# Patient Record
Sex: Female | Born: 1935
Health system: Southern US, Community
[De-identification: ages and names within clinical notes are randomized; demographics above are authoritative.]

## PROBLEM LIST (undated history)

## (undated) DIAGNOSIS — K429 Umbilical hernia without obstruction or gangrene: Secondary | ICD-10-CM

## (undated) DIAGNOSIS — I219 Acute myocardial infarction, unspecified: Secondary | ICD-10-CM

## (undated) DIAGNOSIS — R918 Other nonspecific abnormal finding of lung field: Secondary | ICD-10-CM

## (undated) DIAGNOSIS — E785 Hyperlipidemia, unspecified: Secondary | ICD-10-CM

## (undated) DIAGNOSIS — M069 Rheumatoid arthritis, unspecified: Secondary | ICD-10-CM

## (undated) DIAGNOSIS — C801 Malignant (primary) neoplasm, unspecified: Secondary | ICD-10-CM

## (undated) DIAGNOSIS — I491 Atrial premature depolarization: Secondary | ICD-10-CM

## (undated) DIAGNOSIS — M810 Age-related osteoporosis without current pathological fracture: Secondary | ICD-10-CM

## (undated) DIAGNOSIS — J449 Chronic obstructive pulmonary disease, unspecified: Secondary | ICD-10-CM

## (undated) DIAGNOSIS — B029 Zoster without complications: Secondary | ICD-10-CM

## (undated) DIAGNOSIS — Z923 Personal history of irradiation: Secondary | ICD-10-CM

## (undated) DIAGNOSIS — M25551 Pain in right hip: Secondary | ICD-10-CM

## (undated) HISTORY — DX: Zoster without complications: B02.9

## (undated) HISTORY — DX: Hyperlipidemia, unspecified: E78.5

## (undated) HISTORY — DX: Rheumatoid arthritis, unspecified: M06.9

## (undated) HISTORY — DX: Age-related osteoporosis without current pathological fracture: M81.0

## (undated) HISTORY — DX: Atrial premature depolarization: I49.1

## (undated) HISTORY — PX: OTHER SURGICAL HISTORY: SHX169

## (undated) HISTORY — DX: Umbilical hernia without obstruction or gangrene: K42.9

## (undated) HISTORY — DX: Chronic obstructive pulmonary disease, unspecified: J44.9

---

## 1997-07-29 ENCOUNTER — Encounter: Admission: RE | Admit: 1997-07-29 | Discharge: 1997-07-29 | Payer: Self-pay | Admitting: Family Medicine

## 1997-08-27 ENCOUNTER — Ambulatory Visit (HOSPITAL_BASED_OUTPATIENT_CLINIC_OR_DEPARTMENT_OTHER): Admission: RE | Admit: 1997-08-27 | Discharge: 1997-08-27 | Payer: Self-pay | Admitting: *Deleted

## 1997-09-05 ENCOUNTER — Ambulatory Visit (HOSPITAL_COMMUNITY): Admission: RE | Admit: 1997-09-05 | Discharge: 1997-09-05 | Payer: Self-pay | Admitting: *Deleted

## 1997-09-23 ENCOUNTER — Ambulatory Visit (HOSPITAL_COMMUNITY): Admission: RE | Admit: 1997-09-23 | Discharge: 1997-09-23 | Payer: Self-pay | Admitting: *Deleted

## 1997-09-25 ENCOUNTER — Encounter: Admission: RE | Admit: 1997-09-25 | Discharge: 1997-09-25 | Payer: Self-pay | Admitting: Family Medicine

## 1997-10-22 ENCOUNTER — Encounter: Admission: RE | Admit: 1997-10-22 | Discharge: 1997-10-22 | Payer: Self-pay | Admitting: Family Medicine

## 1997-10-30 ENCOUNTER — Inpatient Hospital Stay (HOSPITAL_COMMUNITY): Admission: RE | Admit: 1997-10-30 | Discharge: 1997-11-03 | Payer: Self-pay | Admitting: Thoracic Surgery

## 1997-11-12 ENCOUNTER — Encounter: Admission: RE | Admit: 1997-11-12 | Discharge: 1997-11-12 | Payer: Self-pay | Admitting: Sports Medicine

## 1997-12-10 ENCOUNTER — Encounter: Admission: RE | Admit: 1997-12-10 | Discharge: 1997-12-10 | Payer: Self-pay | Admitting: Sports Medicine

## 1998-01-10 ENCOUNTER — Encounter: Admission: RE | Admit: 1998-01-10 | Discharge: 1998-01-10 | Payer: Self-pay | Admitting: Family Medicine

## 1998-01-28 ENCOUNTER — Encounter: Admission: RE | Admit: 1998-01-28 | Discharge: 1998-01-28 | Payer: Self-pay | Admitting: Family Medicine

## 1998-01-28 ENCOUNTER — Ambulatory Visit (HOSPITAL_COMMUNITY): Admission: RE | Admit: 1998-01-28 | Discharge: 1998-01-28 | Payer: Self-pay | Admitting: *Deleted

## 1998-02-14 ENCOUNTER — Encounter: Admission: RE | Admit: 1998-02-14 | Discharge: 1998-02-14 | Payer: Self-pay | Admitting: Family Medicine

## 1998-09-16 ENCOUNTER — Ambulatory Visit (HOSPITAL_COMMUNITY): Admission: RE | Admit: 1998-09-16 | Discharge: 1998-09-16 | Payer: Self-pay | Admitting: Gastroenterology

## 1999-06-09 ENCOUNTER — Encounter: Admission: RE | Admit: 1999-06-09 | Discharge: 1999-09-07 | Payer: Self-pay | Admitting: Internal Medicine

## 1999-12-10 ENCOUNTER — Other Ambulatory Visit: Admission: RE | Admit: 1999-12-10 | Discharge: 1999-12-10 | Payer: Self-pay | Admitting: Family Medicine

## 2000-03-13 ENCOUNTER — Emergency Department (HOSPITAL_COMMUNITY): Admission: EM | Admit: 2000-03-13 | Discharge: 2000-03-14 | Payer: Self-pay | Admitting: Emergency Medicine

## 2000-03-14 ENCOUNTER — Encounter: Payer: Self-pay | Admitting: Emergency Medicine

## 2000-12-04 ENCOUNTER — Encounter: Payer: Self-pay | Admitting: Emergency Medicine

## 2000-12-04 ENCOUNTER — Emergency Department (HOSPITAL_COMMUNITY): Admission: EM | Admit: 2000-12-04 | Discharge: 2000-12-04 | Payer: Self-pay | Admitting: Emergency Medicine

## 2000-12-13 ENCOUNTER — Other Ambulatory Visit: Admission: RE | Admit: 2000-12-13 | Discharge: 2000-12-13 | Payer: Self-pay | Admitting: Family Medicine

## 2001-12-21 ENCOUNTER — Other Ambulatory Visit: Admission: RE | Admit: 2001-12-21 | Discharge: 2001-12-21 | Payer: Self-pay | Admitting: Family Medicine

## 2002-12-27 ENCOUNTER — Other Ambulatory Visit: Admission: RE | Admit: 2002-12-27 | Discharge: 2002-12-27 | Payer: Self-pay | Admitting: Family Medicine

## 2003-12-24 ENCOUNTER — Other Ambulatory Visit: Admission: RE | Admit: 2003-12-24 | Discharge: 2003-12-24 | Payer: Self-pay | Admitting: Obstetrics and Gynecology

## 2004-01-09 ENCOUNTER — Encounter: Admission: RE | Admit: 2004-01-09 | Discharge: 2004-01-09 | Payer: Self-pay | Admitting: Obstetrics and Gynecology

## 2004-02-19 ENCOUNTER — Ambulatory Visit: Admission: RE | Admit: 2004-02-19 | Discharge: 2004-02-19 | Payer: Self-pay | Admitting: Gynecology

## 2004-03-10 ENCOUNTER — Ambulatory Visit (HOSPITAL_COMMUNITY): Admission: RE | Admit: 2004-03-10 | Discharge: 2004-03-10 | Payer: Self-pay | Admitting: Obstetrics and Gynecology

## 2004-03-10 ENCOUNTER — Encounter (INDEPENDENT_AMBULATORY_CARE_PROVIDER_SITE_OTHER): Payer: Self-pay | Admitting: Specialist

## 2004-06-04 ENCOUNTER — Other Ambulatory Visit: Admission: RE | Admit: 2004-06-04 | Discharge: 2004-06-04 | Payer: Self-pay | Admitting: Obstetrics and Gynecology

## 2005-07-05 ENCOUNTER — Other Ambulatory Visit: Admission: RE | Admit: 2005-07-05 | Discharge: 2005-07-05 | Payer: Self-pay | Admitting: Obstetrics and Gynecology

## 2006-08-04 ENCOUNTER — Other Ambulatory Visit: Admission: RE | Admit: 2006-08-04 | Discharge: 2006-08-04 | Payer: Self-pay | Admitting: Obstetrics and Gynecology

## 2007-08-07 ENCOUNTER — Other Ambulatory Visit: Admission: RE | Admit: 2007-08-07 | Discharge: 2007-08-07 | Payer: Self-pay | Admitting: Obstetrics and Gynecology

## 2008-11-13 ENCOUNTER — Ambulatory Visit (HOSPITAL_COMMUNITY): Admission: RE | Admit: 2008-11-13 | Discharge: 2008-11-13 | Payer: Self-pay | Admitting: Family Medicine

## 2008-11-27 ENCOUNTER — Encounter: Admission: RE | Admit: 2008-11-27 | Discharge: 2008-11-27 | Payer: Self-pay | Admitting: Family Medicine

## 2009-07-30 ENCOUNTER — Inpatient Hospital Stay (HOSPITAL_COMMUNITY): Admission: EM | Admit: 2009-07-30 | Discharge: 2009-08-01 | Payer: Self-pay | Admitting: Emergency Medicine

## 2009-07-31 ENCOUNTER — Ambulatory Visit: Payer: Self-pay | Admitting: Surgery

## 2009-07-31 ENCOUNTER — Encounter (INDEPENDENT_AMBULATORY_CARE_PROVIDER_SITE_OTHER): Payer: Self-pay | Admitting: Internal Medicine

## 2009-12-02 ENCOUNTER — Encounter: Admission: RE | Admit: 2009-12-02 | Discharge: 2009-12-02 | Payer: Self-pay | Admitting: Family Medicine

## 2010-07-08 LAB — BASIC METABOLIC PANEL
BUN: 16 mg/dL (ref 6–23)
BUN: 18 mg/dL (ref 6–23)
CO2: 23 mEq/L (ref 19–32)
CO2: 25 mEq/L (ref 19–32)
Calcium: 8.1 mg/dL — ABNORMAL LOW (ref 8.4–10.5)
Calcium: 9.1 mg/dL (ref 8.4–10.5)
Chloride: 103 mEq/L (ref 96–112)
Chloride: 108 mEq/L (ref 96–112)
Creatinine, Ser: 0.77 mg/dL (ref 0.4–1.2)
Creatinine, Ser: 0.83 mg/dL (ref 0.4–1.2)
GFR calc Af Amer: >60 mL/min (ref >60)
GFR calc Af Amer: >60 mL/min (ref >60)
GFR calc non Af Amer: >60 mL/min (ref >60)
GFR calc non Af Amer: >60 mL/min (ref >60)
Glucose, Bld: 77 mg/dL (ref 70–99)
Glucose, Bld: 90 mg/dL (ref 70–99)
Potassium: 3.8 mEq/L (ref 3.5–5.1)
Potassium: 4.1 mEq/L (ref 3.5–5.1)
Sodium: 133 mEq/L — ABNORMAL LOW (ref 135–145)
Sodium: 140 mEq/L (ref 135–145)

## 2010-07-08 LAB — CULTURE, BLOOD (ROUTINE X 2)
Culture: NO GROWTH
Culture: NO GROWTH

## 2010-07-08 LAB — POCT I-STAT, CHEM 8
BUN: 20 mg/dL (ref 6–23)
Calcium, Ion: 1.11 mmol/L — ABNORMAL LOW (ref 1.12–1.32)
Chloride: 108 mEq/L (ref 96–112)
Creatinine, Ser: 0.8 mg/dL (ref 0.4–1.2)
Glucose, Bld: 83 mg/dL (ref 70–99)
HCT: 37 % (ref 36.0–46.0)
Hemoglobin: 12.6 g/dL (ref 12.0–15.0)
Potassium: 4.1 mEq/L (ref 3.5–5.1)
Sodium: 139 mEq/L (ref 135–145)
TCO2: 23 mmol/L (ref 0–100)

## 2010-07-08 LAB — DIFFERENTIAL
Basophils Absolute: 0 10*3/uL (ref 0.0–0.1)
Basophils Relative: 0 % (ref 0–1)
Eosinophils Absolute: 0.1 10*3/uL (ref 0.0–0.7)
Eosinophils Relative: 2 % (ref 0–5)
Lymphocytes Relative: 20 % (ref 12–46)
Lymphs Abs: 1.7 10*3/uL (ref 0.7–4.0)
Monocytes Absolute: 0.5 10*3/uL (ref 0.1–1.0)
Monocytes Relative: 6 % (ref 3–12)
Neutro Abs: 5.8 10*3/uL (ref 1.7–7.7)
Neutrophils Relative %: 71 % (ref 43–77)

## 2010-07-08 LAB — CBC
HCT: 31.7 % — ABNORMAL LOW (ref 36.0–46.0)
HCT: 34.4 % — ABNORMAL LOW (ref 36.0–46.0)
Hemoglobin: 10.9 g/dL — ABNORMAL LOW (ref 12.0–15.0)
Hemoglobin: 11.9 g/dL — ABNORMAL LOW (ref 12.0–15.0)
MCHC: 34.2 g/dL (ref 30.0–36.0)
MCHC: 34.6 g/dL (ref 30.0–36.0)
MCV: 102.5 fL — ABNORMAL HIGH (ref 78.0–100.0)
MCV: 102.9 fL — ABNORMAL HIGH (ref 78.0–100.0)
Platelets: 262 10*3/uL (ref 150–400)
Platelets: 301 10*3/uL (ref 150–400)
RBC: 3.08 MIL/uL — ABNORMAL LOW (ref 3.87–5.11)
RBC: 3.36 MIL/uL — ABNORMAL LOW (ref 3.87–5.11)
RDW: 15 % (ref 11.5–15.5)
RDW: 15.3 % (ref 11.5–15.5)
WBC: 7.4 10*3/uL (ref 4.0–10.5)
WBC: 8.1 10*3/uL (ref 4.0–10.5)

## 2010-07-08 LAB — URIC ACID: Uric Acid, Serum: 3.3 mg/dL (ref 2.4–7.0)

## 2010-09-04 NOTE — H&P (Signed)
[name], CARGO              ACCOUNT NO.:  [id number]   MEDICAL RECORD NO.:  [id number]          PATIENT TYPE:  AMB   LOCATION:  SDC                           FACILITY:  [facility]   PHYSICIAN:  Charles A. Delcambre, MDDATE OF BIRTH:  [birth year]   DATE OF ADMISSION:  DATE OF DISCHARGE:                                HISTORY & PHYSICAL   A 77 year old para 5, postmenopausal with bleeding in August with fibroid  uterus, bleeding down into the toilet water.  Evaluation with urologist  normal.  Findings with cystoscopy per patient history on transvaginal  ultrasound, fluid in the endometrial cavity, 10 x 5 x 13 mm.  Endometrial  biopsy was attempted but the patient did not tolerate.  Biopsy was  abandoned.  She is now to be admitted for dilatation and curettage for  further evaluation.   PAST MEDICAL HISTORY:  Arthritis.   PAST SURGICAL HISTORY:  Hand surgery, abdominal hernia repair x3, tubal  ligation.   MEDICATIONS:  1.  Methotrexate 2.5 mg once a week.  2.  Prednisone 1 mg three times a day.  3.  Aspirin 81 mg three times a week.  4.  Folic acid.  5.  Multivitamins once a day.   ALLERGIES:  Darvocet, erythromycin;  reaction not specified.   SOCIAL HISTORY:  She does smoke, amount not specified.  No alcohol or drug  use.  She is widowed and not sexually active.   FAMILY HISTORY:  Mother with breast cancer died at age 37.  Daughter lung  and breast cancer.  Brother with prostate cancer.  No uterus cancer or  ovarian cancer.   REVIEW OF SYSTEMS:  Denies fever or chills.  No rashes or lesions.  Headaches and dizziness.  Some seasonal allergies.  No chest pain, shortness  of breath or wheezing.  No diarrhea or constipation, bleeding, melena or  hematochezia.  No urgency, frequency, dysuria, incontinence, currently no  gross hematuria.  No galacturia or emotional changes.   PHYSICAL EXAMINATION:  GENERAL:  Alert and oriented x3 in no distress.  VITAL SIGNS:  Pulse 60,  respirations 20, weight 99 pounds, blood pressure  124/70.  HEENT:  Exam is grossly within normal limits.  NECK:  Supple without thyromegaly or adenopathy.  LUNGS:  Clear bilaterally.  BACK:  Vertebral column nontender to palpation.  BREASTS:  Exam deferred.  Normal exam per patient by primary care physician  per patient history.  HEART:  ? a 1/6 systolic ejection murmur left sternal border.  Otherwise no  rub or gallop and regular rate and rhythm.  ABDOMEN:  Soft, flat, nontender.  No hepatosplenomegaly or masses noted.  PELVIC:  Normal external female genitalia.  Bartholin's, urethral and Skene  glands are normal.  Vault without discharge or lesions.  Uterus somewhat  difficult to palpate with differential from a right adnexal mass versus a  pedunculated fibroid.  This has been worked up with multiple imaging  techniques and consultation with Dr. Argentina Ponder and it is felt that this  is most likely a pedunculated fibroid basis MRI and other imaging  techniques, has been  present, basis other studies back to 2002 up to four  years in 2001 or 2002 up to four years ago.  Otherwise, the vault and cervix  were normal in appearance.  External hemorrhoids were present and peroneal  body appeared normal otherwise.   ASSESSMENT:  Postmenopausal bleeding.   PLAN:  Dilatation and curettage.  Electrocardiogram was on the chart from  previous preoperative about a month ago.  CBC and Chem-20 will be obtained  preoperatively.  N.p.o. past midnight, even prior to surgery.  All questions  were answered.  She accepts risks of infection, bleeding, uterine  perforation, bowel and bladder damage and blood product risk.  We will  proceed as outlined.      CAD/MEDQ  D:  03/04/2004  T:  03/04/2004  Job:  [id number]

## 2010-09-04 NOTE — Consult Note (Signed)
[name], [name]              ACCOUNT NO.:  [id number]   MEDICAL RECORD NO.:  [id number]          PATIENT TYPE:  OUT   LOCATION:  GYN                          FACILITY:  [facility]   PHYSICIAN:  De Blanch, M.D.DATE OF BIRTH:  08-09-1929   DATE OF CONSULTATION:  DATE OF DISCHARGE:                                   CONSULTATION   A 77 year old white female is seen in consultation at the request of Dr.  Loreli Dollar regarding a pelvic mass.   The patient has no significant past gynecologic history.  She has been found  to have what was thought to be uterine fibroids on a CT scan in November,  2001.  These are apparently stable on repeat scan in August, 2002.  Most  recently in September, 2005, the patient had an MRI which shows an 8 cm  mass.  The MRI suggests that this mass may be adjacent to the uterus, more  consistent with an ovarian fibroma rather than a pedunculated uterine  fibroid.  An ultrasound does not help [place] understand the anatomy any further.  Patient has had a CA-125 that was normal.   She has also had a slight amount of vaginal bleeding a couple of weeks ago.  She denies any pelvic pain or pressure, GI or GU symptoms.   PAST MEDICAL HISTORY:  Medical illnesses:  Severe rheumatoid arthritis.   PAST SURGICAL HISTORY:  1.  Umbilical hernia repair.  2.  Bilateral repair of her fingers secondary to rheumatoid arthritis.  3.  Thoracotomy.  4.  Bilateral tubal ligation.   CURRENT MEDICATIONS:  Methotrexate, prednisone, aspirin, folic acid.   FAMILY HISTORY:  Two women in the family, the patient's mother and daughter,  who have died of breast cancer.  There is no ovarian cancer in the family  history.   SOCIAL HISTORY:  Patient is single.   REVIEW OF SYSTEMS:  Negative except as noted above.   PHYSICAL EXAMINATION:  VITAL SIGNS:  Weight 100 pounds.  Height 5 feet.  Blood pressure 130/74.  Pulse 90.  GENERAL:  Patient is a slender white female in no  acute distress.  She has  stigmata of rheumatoid arthritis, especially in the hands.  HEENT:  Negative.  NECK:  Supple without thyromegaly.  There is no supraclavicular or inguinal  adenopathy.  ABDOMEN:  Soft and nontender.  No mass, organomegaly, ascites, or hernias  are noted.  PELVIC:  EG/BUS, vagina, bladder, and urethra are normal but atrophic.  The  cervix is atrophic and small.  Bimanual exam reveals a right adnexal mass,  which is firm and feels consistent with a fibroma.  It is my opinion that  she has a small uterus to the left of this mass, and that this mass may well  be an ovarian fibroma.  Rectovaginal exam confirms.  The mass itself is  approximately 6 cm in diameter.   IMPRESSION:  Solid adnexal or pelvic mass, consistent with either with an  ovarian fibroma or pedunculated uterine fibroids.  There is clear  documentation that has been present for at least four years.  Do no believe  that the characteristics on MRI are consistent with malignancy.  Given the  fact that it is asymptomatic, I think it would be perfectly reasonable to  follow this without any further surgical evaluation.  It is easily felt on  pelvic exam.  I do not necessarily think that follow-up scans are necessary  as long as the examining gynecologist feels confident in measuring it on an  annual basis.   Finally, with regard to her bleeding, I agree with Dr. Sydnee Cabal that a D&C  should be performed for complete evaluation.   She will return to the care of Dr. Sydnee Cabal and arrange for a D&C in the  near future.     Dani   DC/MEDQ  D:  02/19/2004  T:  02/19/2004  Job:  [id number]   cc:   Leonette Most A. Sydnee Cabal, MD  Fax: [phone]   Jethro Bastos, M.D.  [address]  [place]  [place] [address]  Fax: [phone]

## 2010-09-04 NOTE — Op Note (Signed)
NAMESHEMAIAH, [name]              ACCOUNT NO.:  [id number]   MEDICAL RECORD NO.:  [id number]          PATIENT TYPE:  AMB   LOCATION:  SDC                           [facility]ACILITY:  [facility]   PHYSICIAN:  Charles A. Delcambre, MDDATE OF BIRTH:  1929/08/06   DATE OF PROCEDURE:  03/10/2004  DATE OF DISCHARGE:                                 OPERATIVE REPORT   PREOPERATIVE DIAGNOSIS:  Postmenopausal bleeding.   POSTOPERATIVE DIAGNOSIS:  Postmenopausal bleeding.   PROCEDURES:  1.  Paracervical block.  2.  Dilation and curettage.   SURGEON:  Charles A. Delcambre, MD   ASSISTANT:  None.   COMPLICATIONS:  None.   ESTIMATED BLOOD LOSS:  Less than 10 mL.   OPERATIVE FINDINGS:  The uterus sounds to 7 cm.  A small amount of tissue  with D&C.   SPECIMENS:  Endometrial curettings.   ANESTHESIA:  General IV sedation/MAC.   DESCRIPTION OF PROCEDURE:  The patient was taken to the operating room and  placed in the supine position.  Sedation was accomplished IV.  The patient  was then placed in the dorsal lithotomy position and a sterile prep and  drape was undertaken.  A weighted speculum was placed in the vagina.  Anterior lip of the cervix was grasped with a single-tooth tenaculum.  A  paracervical block was placed with 0.25% plain Marcaine 20 mL divided  between 4 and 8 o'clock.  No evidence of intravascular location injection  was noted.  Hegar dilators were then used to dilate up enough to pass a  small banjo curette and gentle curettage was undertaken.  There was no  evidence of perforation.  A small amount of tissue was curetted.  Then polyp  forceps yielded no tissue.  The procedure was terminated.  All instruments  were removed.  Hemostasis was adequate at the tenaculum.  The patient was  awakened and taken to recovery.      CAD/MEDQ  D:  03/10/2004  T:  03/10/2004  Job:  [id number]

## 2010-10-02 ENCOUNTER — Ambulatory Visit
Admission: RE | Admit: 2010-10-02 | Discharge: 2010-10-02 | Disposition: A | Discharge status: Home / Self Care | Payer: Medicare Other | Source: Ambulatory Visit | Attending: Family Medicine | Admitting: Family Medicine

## 2010-10-02 ENCOUNTER — Other Ambulatory Visit: Payer: Self-pay | Admitting: Family Medicine

## 2010-10-02 DIAGNOSIS — R52 Pain, unspecified: Secondary | ICD-10-CM

## 2010-10-08 ENCOUNTER — Other Ambulatory Visit (HOSPITAL_COMMUNITY): Payer: Self-pay | Admitting: Internal Medicine

## 2010-10-08 DIAGNOSIS — M25551 Pain in right hip: Secondary | ICD-10-CM

## 2010-10-15 ENCOUNTER — Encounter (HOSPITAL_COMMUNITY)
Admission: RE | Admit: 2010-10-15 | Discharge: 2010-10-15 | Disposition: A | Discharge status: Home / Self Care | Payer: Medicare Other | Source: Ambulatory Visit | Attending: Internal Medicine | Admitting: Internal Medicine

## 2010-10-15 ENCOUNTER — Encounter (HOSPITAL_COMMUNITY): Payer: Self-pay

## 2010-10-15 DIAGNOSIS — M25559 Pain in unspecified hip: Secondary | ICD-10-CM | POA: Insufficient documentation

## 2010-10-15 DIAGNOSIS — M4120 Other idiopathic scoliosis, site unspecified: Secondary | ICD-10-CM | POA: Insufficient documentation

## 2010-10-15 DIAGNOSIS — M25551 Pain in right hip: Secondary | ICD-10-CM

## 2010-10-15 HISTORY — DX: Pain in right hip: M25.551

## 2010-10-15 MED ORDER — TECHNETIUM TC 99M MEDRONATE IV KIT
24.0000 | PACK | Freq: Once | INTRAVENOUS | Status: AC | PRN
  Administered 2010-10-15: 24 via INTRAVENOUS

## 2010-11-11 ENCOUNTER — Ambulatory Visit: Payer: Medicare Other | Attending: Orthopedic Surgery | Admitting: Rehabilitation

## 2010-11-11 DIAGNOSIS — M25559 Pain in unspecified hip: Secondary | ICD-10-CM | POA: Insufficient documentation

## 2010-11-11 DIAGNOSIS — R262 Difficulty in walking, not elsewhere classified: Secondary | ICD-10-CM | POA: Insufficient documentation

## 2010-11-11 DIAGNOSIS — M25659 Stiffness of unspecified hip, not elsewhere classified: Secondary | ICD-10-CM | POA: Insufficient documentation

## 2010-11-11 DIAGNOSIS — IMO0001 Reserved for inherently not codable concepts without codable children: Secondary | ICD-10-CM | POA: Insufficient documentation

## 2010-11-26 ENCOUNTER — Ambulatory Visit: Payer: Medicare Other | Attending: Orthopedic Surgery | Admitting: Physical Therapy

## 2010-11-26 DIAGNOSIS — R262 Difficulty in walking, not elsewhere classified: Secondary | ICD-10-CM | POA: Insufficient documentation

## 2010-11-26 DIAGNOSIS — M25559 Pain in unspecified hip: Secondary | ICD-10-CM | POA: Insufficient documentation

## 2010-11-26 DIAGNOSIS — M25659 Stiffness of unspecified hip, not elsewhere classified: Secondary | ICD-10-CM | POA: Insufficient documentation

## 2010-11-26 DIAGNOSIS — IMO0001 Reserved for inherently not codable concepts without codable children: Secondary | ICD-10-CM | POA: Insufficient documentation

## 2010-12-03 ENCOUNTER — Encounter: Payer: Medicare Other | Admitting: Rehabilitation

## 2010-12-08 ENCOUNTER — Other Ambulatory Visit (HOSPITAL_COMMUNITY): Payer: Self-pay | Admitting: Family Medicine

## 2010-12-08 DIAGNOSIS — Z1231 Encounter for screening mammogram for malignant neoplasm of breast: Secondary | ICD-10-CM

## 2010-12-10 ENCOUNTER — Encounter: Payer: Medicare Other | Admitting: Rehabilitation

## 2010-12-16 ENCOUNTER — Ambulatory Visit (HOSPITAL_COMMUNITY)
Admission: RE | Admit: 2010-12-16 | Discharge: 2010-12-16 | Disposition: A | Discharge status: Home / Self Care | Payer: Medicare Other | Source: Ambulatory Visit | Attending: Family Medicine | Admitting: Family Medicine

## 2010-12-16 DIAGNOSIS — Z1231 Encounter for screening mammogram for malignant neoplasm of breast: Secondary | ICD-10-CM | POA: Insufficient documentation

## 2010-12-17 ENCOUNTER — Encounter: Payer: Medicare Other | Admitting: Rehabilitation

## 2011-02-05 ENCOUNTER — Other Ambulatory Visit: Payer: Self-pay | Admitting: Obstetrics and Gynecology

## 2011-02-05 ENCOUNTER — Other Ambulatory Visit (HOSPITAL_COMMUNITY)
Admission: RE | Admit: 2011-02-05 | Discharge: 2011-02-05 | Disposition: A | Discharge status: Home / Self Care | Payer: Medicare Other | Source: Ambulatory Visit | Attending: Obstetrics and Gynecology | Admitting: Obstetrics and Gynecology

## 2011-02-05 DIAGNOSIS — Z01419 Encounter for gynecological examination (general) (routine) without abnormal findings: Secondary | ICD-10-CM | POA: Insufficient documentation

## 2011-02-15 ENCOUNTER — Other Ambulatory Visit: Payer: Self-pay | Admitting: Obstetrics and Gynecology

## 2011-02-15 DIAGNOSIS — R1900 Intra-abdominal and pelvic swelling, mass and lump, unspecified site: Secondary | ICD-10-CM

## 2011-02-18 ENCOUNTER — Other Ambulatory Visit: Payer: Medicare Other

## 2011-02-22 ENCOUNTER — Ambulatory Visit
Admission: RE | Admit: 2011-02-22 | Discharge: 2011-02-22 | Disposition: A | Discharge status: Home / Self Care | Payer: Medicare Other | Source: Ambulatory Visit | Attending: Obstetrics and Gynecology | Admitting: Obstetrics and Gynecology

## 2011-02-22 DIAGNOSIS — R1900 Intra-abdominal and pelvic swelling, mass and lump, unspecified site: Secondary | ICD-10-CM

## 2011-02-22 MED ORDER — GADOBENATE DIMEGLUMINE 529 MG/ML IV SOLN
10.0000 mL | Freq: Once | INTRAVENOUS | Status: AC | PRN
  Administered 2011-02-22: 10 mL via INTRAVENOUS

## 2011-11-22 ENCOUNTER — Other Ambulatory Visit (HOSPITAL_COMMUNITY): Payer: Self-pay | Admitting: Family Medicine

## 2011-11-22 DIAGNOSIS — Z1231 Encounter for screening mammogram for malignant neoplasm of breast: Secondary | ICD-10-CM

## 2011-12-17 ENCOUNTER — Ambulatory Visit (HOSPITAL_COMMUNITY): Payer: Medicare Other

## 2011-12-23 ENCOUNTER — Ambulatory Visit (HOSPITAL_COMMUNITY)
Admission: RE | Admit: 2011-12-23 | Discharge: 2011-12-23 | Disposition: A | Discharge status: Home / Self Care | Payer: Medicare Other | Source: Ambulatory Visit | Attending: Family Medicine | Admitting: Family Medicine

## 2011-12-23 DIAGNOSIS — Z1231 Encounter for screening mammogram for malignant neoplasm of breast: Secondary | ICD-10-CM | POA: Insufficient documentation

## 2012-02-28 ENCOUNTER — Other Ambulatory Visit: Payer: Self-pay | Admitting: Family Medicine

## 2012-02-28 ENCOUNTER — Ambulatory Visit
Admission: RE | Admit: 2012-02-28 | Discharge: 2012-02-28 | Disposition: A | Discharge status: Home / Self Care | Payer: Medicare Other | Source: Ambulatory Visit | Attending: Family Medicine | Admitting: Family Medicine

## 2012-02-28 DIAGNOSIS — R0781 Pleurodynia: Secondary | ICD-10-CM

## 2012-03-07 ENCOUNTER — Other Ambulatory Visit: Payer: Self-pay | Admitting: Family Medicine

## 2012-03-07 DIAGNOSIS — R911 Solitary pulmonary nodule: Secondary | ICD-10-CM

## 2012-03-10 ENCOUNTER — Ambulatory Visit
Admission: RE | Admit: 2012-03-10 | Discharge: 2012-03-10 | Disposition: A | Discharge status: Home / Self Care | Payer: Medicare Other | Source: Ambulatory Visit | Attending: Family Medicine | Admitting: Family Medicine

## 2012-03-10 ENCOUNTER — Other Ambulatory Visit: Payer: Medicare Other

## 2012-03-10 DIAGNOSIS — R911 Solitary pulmonary nodule: Secondary | ICD-10-CM

## 2012-03-10 MED ORDER — IOHEXOL 300 MG/ML  SOLN
75.0000 mL | Freq: Once | INTRAMUSCULAR | Status: AC | PRN
  Administered 2012-03-10: 75 mL via INTRAVENOUS

## 2012-03-21 ENCOUNTER — Institutional Professional Consult (permissible substitution) (INDEPENDENT_AMBULATORY_CARE_PROVIDER_SITE_OTHER): Payer: Medicare Other | Admitting: Surgery

## 2012-03-21 ENCOUNTER — Other Ambulatory Visit: Payer: Self-pay | Admitting: Surgery

## 2012-03-21 VITALS — BP 154/76 | HR 67 | Resp 18 | Ht 59.0 in | Wt 97.0 lb

## 2012-03-21 DIAGNOSIS — R918 Other nonspecific abnormal finding of lung field: Secondary | ICD-10-CM

## 2012-03-21 DIAGNOSIS — D381 Neoplasm of uncertain behavior of trachea, bronchus and lung: Secondary | ICD-10-CM

## 2012-03-21 DIAGNOSIS — Z0279 Encounter for issue of other medical certificate: Secondary | ICD-10-CM

## 2012-03-21 DIAGNOSIS — C3411 Malignant neoplasm of upper lobe, right bronchus or lung: Secondary | ICD-10-CM | POA: Insufficient documentation

## 2012-03-22 ENCOUNTER — Encounter: Payer: Self-pay | Admitting: Surgery

## 2012-03-22 NOTE — Progress Notes (Signed)
[address]            [place] [address]          [phone]      PCP is Lupita Raider, MD Referring Provider is Lupita Raider, MD  Chief Complaint  Patient presents with  . Lung Lesion    RIGHT and LEFT lung.Marland KitchenMarland KitchenCT CHEST    HPI:  The patient is an 78 year old previous smoker until 2 years ago who presented to her primary physician on 02/28/2012 reporting a two-week history of left flank pain made worse by lying on her left side. She had a chest x-ray performed which showed no evidence of rib fracture but did show COPD as well as some opacity in the right upper lung field that could be scarring or a lung lesion. There were also several left lung nodules with at least one or two of them appearing to be calcified. She underwent a CT scan of the chest 03/10/2012 which showed a 1.3 x 2.7 x 1.9 cm cavitary lesion in the right upper lobe that is somewhat thick walled. There is also a spiculated lesion within the superior segment of the left lower lobe measuring 0.7 x 1.1 x 0.6 cm. There were calcified granulomas within the left upper lobe. There were also some postsurgical changes in the right upper lung and some linear scarring. The patient said that in the early 2000's she was found to have a right lung lesion and underwent a right thoracotomy by Dr.Burney but said that he didn't find anything and did not take out any lung tissue. I cannot find any CT scan results from that time nor any notes by Dr. Edwyna Shell in [facility] or our office chart.  Past Medical History  Diagnosis Date  . Hip pain, right   . Osteoporosis   . RA (rheumatoid arthritis)   . PAC (premature atrial contraction)   . Shingles   . HLD (hyperlipidemia)   . Hernia, umbilical   . COPD (chronic obstructive pulmonary disease)     History reviewed: Hx of right thoracotomy in past by Dr. Edwyna Shell. Details uncertain.  History reviewed:  Hx of breast cancer in mother and daughter.  Social  History History  Substance Use Topics  . Smoking status: Former Smoker    Types: Cigarettes    Quit date: 01/11/2010  . Smokeless tobacco: Never Used  . Alcohol Use: No    Current Outpatient Prescriptions  Medication Sig Dispense Refill  . alendronate (FOSAMAX) 70 MG tablet Take 70 mg by mouth every 7 (seven) days. Take with a full glass of water on an empty stomach.      . Calcium 1500 MG tablet Take 1,500 mg by mouth daily.      Marland Kitchen estradiol (ESTRACE) 0.1 MG/GM vaginal cream Place 2 g vaginally daily.      . methotrexate 2.5 MG tablet Take by mouth once a week.       . Multiple Vitamin (MULTIVITAMIN) capsule Take 1 capsule by mouth daily.      . predniSONE (DELTASONE) 1 MG tablet Take 1 mg by mouth daily. 4 tabs daily      . traMADol (ULTRAM) 50 MG tablet Take 50 mg by mouth every 6 (six) hours as needed.        Allergies  Allergen Reactions  . Latex Other (See Comments)    Blisters on her mouth  .  Boniva (Ibandronic Acid)     Muscle aches  . Darvocet (Propoxyphene-Acetaminophen)   . Erythromycin   . Morphine And Related   . Oxycontin (Oxycodone Hcl Er)   . Sulfa Antibiotics   . Vicodin (Hydrocodone-Acetaminophen)     dizzy    Review of Systems  Constitutional: Negative.   HENT: Negative.   Eyes: Negative.   Respiratory: Negative.   Cardiovascular: Negative.   Gastrointestinal: Negative.   Genitourinary: Negative.   Musculoskeletal: Positive for arthralgias.  Neurological: Negative.   Hematological: Negative.   Psychiatric/Behavioral: Negative.     BP 154/76  Pulse 67  Resp 18  Ht 4\' 11"  (1.499 m)  Wt 97 lb (43.999 kg)  BMI 19.59 kg/m2  SpO2 98% Physical Exam  Constitutional: She is oriented to person, place, and time.       Thin, elderly woman in no distress.  HENT:  Head: Normocephalic and atraumatic.  Mouth/Throat: Oropharynx is clear and moist. No oropharyngeal exudate.  Eyes: EOM are normal. Pupils are equal, round, and reactive to light.  Neck:  Normal range of motion. Neck supple. No JVD present. No tracheal deviation present. No thyromegaly present.  Cardiovascular: Normal rate, regular rhythm, normal heart sounds and intact distal pulses.  Exam reveals no gallop and no friction rub.   No murmur heard. Pulmonary/Chest: Effort normal and breath sounds normal. No respiratory distress. She has no rales. She exhibits no tenderness.  Abdominal: Soft. Bowel sounds are normal. She exhibits no distension and no mass. There is no tenderness.  Musculoskeletal: Normal range of motion. She exhibits no edema and no tenderness.       Arthritic changes of both hands.  Lymphadenopathy:    She has no cervical adenopathy.  Neurological: She is alert and oriented to person, place, and time. She has normal strength. No cranial nerve deficit or sensory deficit.  Skin: Skin is warm and dry.  Psychiatric: She has a normal mood and affect.     Diagnostic Tests:  *RADIOLOGY REPORT*   Clinical Data: Questionable vague lung nodules on chest x-ray, prior lung surgery on the right 14 years ago   CT CHEST WITH CONTRAST   Technique:  Multidetector CT imaging of the chest was performed following the standard protocol during bolus administration of intravenous contrast.   Contrast: 75mL OMNIPAQUE IOHEXOL 300 MG/ML  SOLN   Comparison: Chest x-ray of 02/28/2012   Findings: Postsurgical changes within the right upper lung are stable with sutures noted and linear scarring.  However, within the anterior right upper lobe there is a cavitary lesion present of 1.3 x 2.7 x 1.9 cm.  This is somewhat thick-walled and could represent a lung abscess with necrotic tumor also a consideration.   On the left there is a spiculated lesion noted within the superior segment of the left lower lobe measuring 0.7 x 1.1 x 0.6 cm.  A primary lung carcinoma or metastatic lesion would be considerations.  Pleuroparenchymal scarring is noted in the apices. Calcified  granulomas are noted within the left lower lobe.  No pleural effusion is seen.   On soft tissue window images, the thyroid gland is unremarkable. The thoracic aorta opacifies and the origins of the great vessels appear patent.  There is calcification within the ascending aorta, aortic arch, and descending thoracic aorta.  The heart is mildly enlarged.  The pulmonary arteries opacify with no acute abnormality evident.  What is seen of the upper abdomen is unremarkable.  There are degenerative changes throughout the thoracic  spine with thoracolumbar scoliosis present.   IMPRESSION:   1.  Cavitary lesion within the right upper lobe of 1.3 x 2.7 x 1.9 cm.  This is somewhat thick-walled and could represent lung abscess or necrotic tumor. 2.  Spiculated lesion within the superior segment of the left lower lobe of 0.7 x 1.1 x 0.6 cm.  A primary or metastatic lesion would be the main consideration. 3.  Calcified   granulomas within the left lower lobe.     Original Report Authenticated By: Dwyane Dee, M.D.    Impression/Plan : She has a small cavitary right upper lobe lung lesion as well as a spiculated lesion within the superior segment of the left lower lobe. Both of these could be malignant or benign lesions. It would be best to obtain a PET scan to see if one or both of these have hypermetabolic activity which may guide the decision to biopsy or follow these lesions. She is a fairly frail-looking 78 year old woman so a conservative approach is warranted. I will plan to see her back after the PET scan is completed to discuss results with her and decide on the next step.

## 2012-03-29 ENCOUNTER — Encounter (HOSPITAL_COMMUNITY)
Admission: RE | Admit: 2012-03-29 | Discharge: 2012-03-29 | Disposition: A | Discharge status: Home / Self Care | Payer: Medicare Other | Source: Ambulatory Visit | Attending: Surgery | Admitting: Surgery

## 2012-03-29 DIAGNOSIS — D381 Neoplasm of uncertain behavior of trachea, bronchus and lung: Secondary | ICD-10-CM

## 2012-04-03 ENCOUNTER — Encounter (HOSPITAL_COMMUNITY)
Admission: RE | Admit: 2012-04-03 | Discharge: 2012-04-03 | Disposition: A | Discharge status: Home / Self Care | Payer: Medicare Other | Source: Ambulatory Visit | Attending: Surgery | Admitting: Surgery

## 2012-04-03 ENCOUNTER — Encounter (HOSPITAL_COMMUNITY): Payer: Self-pay

## 2012-04-03 DIAGNOSIS — R911 Solitary pulmonary nodule: Secondary | ICD-10-CM | POA: Insufficient documentation

## 2012-04-03 DIAGNOSIS — R222 Localized swelling, mass and lump, trunk: Secondary | ICD-10-CM | POA: Insufficient documentation

## 2012-04-03 HISTORY — DX: Other nonspecific abnormal finding of lung field: R91.8

## 2012-04-03 LAB — GLUCOSE, CAPILLARY: Glucose-Capillary: 89 mg/dL (ref 70–99)

## 2012-04-03 MED ORDER — FLUDEOXYGLUCOSE F - 18 (FDG) INJECTION
19.1000 | Freq: Once | INTRAVENOUS | Status: AC | PRN
  Administered 2012-04-03: 19.1 via INTRAVENOUS

## 2012-04-04 ENCOUNTER — Encounter: Payer: Self-pay | Admitting: Surgery

## 2012-04-04 ENCOUNTER — Ambulatory Visit (INDEPENDENT_AMBULATORY_CARE_PROVIDER_SITE_OTHER): Payer: Self-pay | Admitting: Surgery

## 2012-04-04 VITALS — BP 148/70 | HR 70 | Resp 18 | Ht 59.0 in | Wt 98.0 lb

## 2012-04-04 DIAGNOSIS — Z712 Person consulting for explanation of examination or test findings: Secondary | ICD-10-CM

## 2012-04-04 DIAGNOSIS — Z7189 Other specified counseling: Secondary | ICD-10-CM

## 2012-04-04 DIAGNOSIS — R918 Other nonspecific abnormal finding of lung field: Secondary | ICD-10-CM

## 2012-04-05 ENCOUNTER — Encounter: Payer: Self-pay | Admitting: Surgery

## 2012-04-05 ENCOUNTER — Other Ambulatory Visit: Payer: Self-pay | Admitting: Surgery

## 2012-04-05 DIAGNOSIS — D381 Neoplasm of uncertain behavior of trachea, bronchus and lung: Secondary | ICD-10-CM

## 2012-04-05 NOTE — Progress Notes (Signed)
[address]            [place] [address]          [phone]      HPI:  The patient is an 78 year old previous smoker until 2 years ago who presented to her primary physician on 02/28/2012 reporting a two-week history of left flank pain made worse by lying on her left side. She had a chest x-ray performed which showed no evidence of rib fracture but did show COPD as well as some opacity in the right upper lung field that could be scarring or a lung lesion. There were also several left lung nodules with at least one or two of them appearing to be calcified. She underwent a CT scan of the chest 03/10/2012 which showed a 1.3 x 2.7 x 1.9 cm cavitary lesion in the right upper lobe that is somewhat thick walled. There is also a spiculated lesion within the superior segment of the left lower lobe measuring 0.7 x 1.1 x 0.6 cm. There were calcified granulomas within the left upper lobe. There were also some postsurgical changes in the right upper lung and some linear scarring. The patient said that in the early 2000's she was found to have a right lung lesion and underwent a right thoracotomy by Dr.Burney but said that he didn't find anything and did not take out any lung tissue. I cannot find any CT scan results from that time nor any notes by Dr. Edwyna Shell in [facility] or our office chart.  She has a small cavitary right upper lobe lung lesion as well as a spiculated lesion within the superior segment of the left lower lobe. Both of these could be malignant or benign lesions. I thought it would be best to obtain a PET scan to see if one or both of these have hypermetabolic activity which may guide the decision to biopsy or follow these lesions.     Current Outpatient Prescriptions  Medication Sig Dispense Refill  . alendronate (FOSAMAX) 70 MG tablet Take 70 mg by mouth every 7 (seven) days. Take with a full glass of water on an empty stomach.      . Calcium 1500 MG tablet Take  1,500 mg by mouth daily.      Marland Kitchen estradiol (ESTRACE) 0.1 MG/GM vaginal cream Place 2 g vaginally daily.      . methotrexate 2.5 MG tablet Take by mouth once a week.       . Multiple Vitamin (MULTIVITAMIN) capsule Take 1 capsule by mouth daily.      . predniSONE (DELTASONE) 1 MG tablet Take 1 mg by mouth daily. 4 tabs daily      . traMADol (ULTRAM) 50 MG tablet Take 50 mg by mouth every 6 (six) hours as needed.         Physical Exam:  BP 148/70  Pulse 70  Resp 18  Ht 4\' 11"  (1.499 m)  Wt 98 lb (44.453 kg)  BMI 19.79 kg/m2  SpO2 94% She looks comfortable. Lung exam is clear.  Diagnostic Tests:  *RADIOLOGY REPORT*   Clinical Data: Initial treatment strategy for right upper lobe lung mass.  Rule out metastasis.   NUCLEAR MEDICINE PET SKULL BASE TO THIGH   Fasting Blood Glucose:  89   Technique:  19.1 mCi F-18 FDG was injected intravenously. CT data was obtained and used for attenuation correction and  anatomic localization only.  (This was not acquired as a diagnostic CT examination.) Additional exam technical data entered on technologist worksheet.   Comparison:  03/10/2012 chest CT.  No prior PET.  Abdominal pelvic CT of 12/06/2003 is reviewed (prior report not available).   Findings:   Neck: No abnormal hypermetabolism.   Chest:  Right upper lobe lung nodule which measures 2.6 x 1.7 cm and a S.U.V. max of 6.1 on image 74/series 2. 2.7 x 1.3 cm on the priorCT.  Resolution of previously described central cavitation. No surrounding inflammation.   No abnormal thoracic nodal activity.  The superior segment left lower lobe nodule described on the prior exam is similar in size at approximately 1.0 cm.  No corresponding hypermetabolism.   Abdomen/Pelvis:  No abnormal hypermetabolism.   Skeleton:  No hypermetabolic osseous foci   CT  images performed for attenuation correction demonstrate no significant findings within the neck.  Cardiomegaly and a pectus excavatum  deformity. Multivessel coronary artery atherosclerosis. Centrilobular emphysema.  Surgical sutures within the right upper lobe just cephalad the above described lung lesion.  There is also probable scarring at the left lung base laterally.  Extensive uterine calcifications which are nonspecific but could be vascular. No well-defined uterine mass.   Moderate osteopenia.   IMPRESSION:   1.  Hypermetabolic right upper lobe pulmonary nodule; similar in size with interval resolution of cavitation since 03/10/2012. Suspicious for primary bronchogenic carcinoma.   An infectious or inflammatory process is felt much less likely. 2.  No evidence of thoracic nodal or extrathoracic hypermetabolic metastasis. 3.  The superior segment left lower lobe pulmonary nodule  is without hypermetabolic correlate but is at the low end of resolution for PET. Recommend attention on follow-up.     Original Report Authenticated By: Jeronimo Greaves, M.D.   Impression:  The right upper lobe pulmonary nodule looks more solid than it did on 03/10/2012. It has hypermetabolic activity and is suspicious for a primary bronchogenic carcinoma. An infectious or inflammatory process is felt to be much less likely although she does give a history of prior surgery on the right side to biopsy a lung lesion which was negative by CT guided biopsy in the past. This history and the fact that she has a lesion in the superior segment of the left lower lobe still raises the possibility that this could be a benign process. The lesion in the right lung does not appear amenable to wedge resection due to its more central location and I don't think she would be a candidate for lobectomy at her age. I think it would be worthwhile to get a CT guided needle biopsy of this and if it is cancer we would refer her for consideration of SBRT.  Plan:  We will obtain a CT guided needle biopsy of the right upper lobe lung lesion and we'll see her back  after that is completed. The left lower lobe lung lesion is much smaller and not hypermetabolic and will have to be followed.

## 2012-04-09 ENCOUNTER — Other Ambulatory Visit: Payer: Self-pay | Admitting: Physician Assistant

## 2012-04-10 ENCOUNTER — Encounter (HOSPITAL_COMMUNITY): Payer: Self-pay | Admitting: Pharmacy Technician

## 2012-04-13 ENCOUNTER — Encounter (HOSPITAL_COMMUNITY): Payer: Self-pay

## 2012-04-13 ENCOUNTER — Ambulatory Visit (HOSPITAL_COMMUNITY)
Admission: RE | Admit: 2012-04-13 | Discharge: 2012-04-13 | Disposition: A | Discharge status: Home / Self Care | Payer: Medicare Other | Source: Ambulatory Visit | Attending: Surgery | Admitting: Surgery

## 2012-04-13 ENCOUNTER — Ambulatory Visit (HOSPITAL_COMMUNITY)
Admission: RE | Admit: 2012-04-13 | Discharge: 2012-04-13 | Disposition: A | Discharge status: Home / Self Care | Payer: Medicare Other | Source: Ambulatory Visit | Attending: Interventional Radiology | Admitting: Interventional Radiology

## 2012-04-13 DIAGNOSIS — D381 Neoplasm of uncertain behavior of trachea, bronchus and lung: Secondary | ICD-10-CM

## 2012-04-13 DIAGNOSIS — J984 Other disorders of lung: Secondary | ICD-10-CM | POA: Insufficient documentation

## 2012-04-13 LAB — CBC
HCT: 34 % — ABNORMAL LOW (ref 36.0–46.0)
Hemoglobin: 11.1 g/dL — ABNORMAL LOW (ref 12.0–15.0)
MCH: 33.2 pg (ref 26.0–34.0)
MCHC: 32.6 g/dL (ref 30.0–36.0)
MCV: 101.8 fL — ABNORMAL HIGH (ref 78.0–100.0)
Platelets: 202 10*3/uL (ref 150–400)
RBC: 3.34 MIL/uL — ABNORMAL LOW (ref 3.87–5.11)
RDW: 14.6 % (ref 11.5–15.5)
WBC: 6.9 10*3/uL (ref 4.0–10.5)

## 2012-04-13 LAB — PROTIME-INR
INR: 1.09 (ref 0.00–1.49)
Prothrombin Time: 14 seconds (ref 11.6–15.2)

## 2012-04-13 LAB — APTT: aPTT: 34 seconds (ref 24–37)

## 2012-04-13 MED ORDER — FENTANYL CITRATE 0.05 MG/ML IJ SOLN
INTRAMUSCULAR | Status: AC
  Filled 2012-04-13: qty 4

## 2012-04-13 MED ORDER — SODIUM CHLORIDE 0.9 % IV SOLN
INTRAVENOUS | Status: DC | PRN
  Administered 2012-04-13: 50 mL/h via INTRAVENOUS

## 2012-04-13 MED ORDER — SODIUM CHLORIDE 0.9 % IV SOLN: INTRAVENOUS | Status: DC

## 2012-04-13 MED ORDER — MIDAZOLAM HCL 2 MG/2ML IJ SOLN
INTRAMUSCULAR | Status: DC | PRN
  Administered 2012-04-13 (×2): 0.5 mg via INTRAVENOUS

## 2012-04-13 MED ORDER — ONDANSETRON HCL 4 MG/2ML IJ SOLN
INTRAMUSCULAR | Status: AC
  Filled 2012-04-13: qty 2

## 2012-04-13 MED ORDER — FENTANYL CITRATE 0.05 MG/ML IJ SOLN
INTRAMUSCULAR | Status: DC | PRN
  Administered 2012-04-13: 25 ug via INTRAVENOUS
  Administered 2012-04-13: 12.5 ug via INTRAVENOUS

## 2012-04-13 MED ORDER — MIDAZOLAM HCL 2 MG/2ML IJ SOLN
INTRAMUSCULAR | Status: AC
  Filled 2012-04-13: qty 4

## 2012-04-13 NOTE — Procedures (Signed)
Technically successful CT guided biopsy of hypermetabolic right upper lobe pulm nodule.  No immediate post procedural complications.

## 2012-04-13 NOTE — H&P (Signed)
[name] is an 78 y.o. female.   Chief Complaint: left sided chest pain x weeks Was seen by Dr Laneta Simmers; CT shows Bilat lung nodules RUL +PET Scheduled now for Rt lung mass biopsy HPI: Rheumatoid arthritis; HLD; PAC; COPD  Past Medical History  Diagnosis Date  . Hip pain, right   . Osteoporosis   . RA (rheumatoid arthritis)   . PAC (premature atrial contraction)   . Shingles   . HLD (hyperlipidemia)   . Hernia, umbilical   . COPD (chronic obstructive pulmonary disease)   . Lung mass     No past surgical history on file.  No family history on file. Social History:  reports that she quit smoking about 2 years ago. Her smoking use included Cigarettes. She has never used smokeless tobacco. She reports that she does not drink alcohol or use illicit drugs.  Allergies:  Allergies  Allergen Reactions  . Latex Other (See Comments)    Blisters on her mouth  . Boniva (Ibandronic Acid)     Muscle aches  . Darvocet (Propoxyphene-Acetaminophen) Nausea And Vomiting  . Erythromycin Nausea And Vomiting  . Morphine And Related Nausea And Vomiting  . Oxycontin (Oxycodone Hcl Er) Nausea And Vomiting  . Sulfa Antibiotics Nausea And Vomiting  . Vicodin (Hydrocodone-Acetaminophen)     dizzy     (Not in a hospital admission)  Results for orders placed during the hospital encounter of 04/13/12 (from the past 48 hour(s))  APTT     Status: Normal   Collection Time   04/13/12  9:40 AM      Component Value Range Comment   aPTT 34  24 - 37 seconds   CBC     Status: Abnormal   Collection Time   04/13/12  9:40 AM      Component Value Range Comment   WBC 6.9  4.0 - 10.5 K/uL    RBC 3.34 (*) 3.87 - 5.11 MIL/uL    Hemoglobin 11.1 (*) 12.0 - 15.0 g/dL    HCT 47.8 (*) 29.5 - 46.0 %    MCV 101.8 (*) 78.0 - 100.0 fL    MCH 33.2  26.0 - 34.0 pg    MCHC 32.6  30.0 - 36.0 g/dL    RDW 62.1  30.8 - 65.7 %    Platelets 202  150 - 400 K/uL   PROTIME-INR     Status: Normal   Collection Time   04/13/12  9:40 AM      Component Value Range Comment   Prothrombin Time 14.0  11.6 - 15.2 seconds    INR 1.09  0.00 - 1.49    No results found.  Review of Systems  Constitutional: Positive for weight loss. Negative for fever.  Respiratory: Positive for shortness of breath.   Cardiovascular: Positive for chest pain.       Left sided  Gastrointestinal: Negative for nausea and vomiting.  Neurological: Negative for dizziness, weakness and headaches.  Psychiatric/Behavioral: Negative for depression.    Blood pressure 152/59, pulse 61, temperature 98.5 F (36.9 C), temperature source Oral, resp. rate 18, height 4\' 11"  (1.499 m), weight 98 lb (44.453 kg), SpO2 98.00%. Physical Exam  Constitutional: She is oriented to person, place, and time.  Cardiovascular: Normal rate, regular rhythm and normal heart sounds.   Respiratory: Effort normal and breath sounds normal.  GI: Soft. Bowel sounds are normal. There is no tenderness.  Musculoskeletal: Normal range of motion.  Neurological: She is alert and oriented to person,  place, and time.  Psychiatric: She has a normal mood and affect. Her behavior is normal. Judgment and thought content normal.     Assessment/Plan Chest pain evaluated by MD CT and PET +PET RUL mass Scheduled now for lung mass biopsy Pt aware of procedure benefits and risks and agreeable to proceed. Consent signed and in chart  Nerida Boivin A 04/13/2012, 10:36 AM

## 2012-04-16 LAB — CULTURE, ROUTINE-ABSCESS: Culture: NO GROWTH

## 2012-04-18 ENCOUNTER — Ambulatory Visit: Payer: Medicare Other | Admitting: Surgery

## 2012-04-20 ENCOUNTER — Encounter: Payer: Self-pay | Admitting: Radiation Oncology

## 2012-04-20 ENCOUNTER — Ambulatory Visit (INDEPENDENT_AMBULATORY_CARE_PROVIDER_SITE_OTHER): Payer: Medicare Other | Admitting: Surgery

## 2012-04-20 ENCOUNTER — Encounter: Payer: Self-pay | Admitting: Surgery

## 2012-04-20 ENCOUNTER — Encounter: Payer: Self-pay | Admitting: *Deleted

## 2012-04-20 ENCOUNTER — Ambulatory Visit
Admission: RE | Admit: 2012-04-20 | Discharge: 2012-04-20 | Disposition: A | Discharge status: Home / Self Care | Payer: Medicare Other | Source: Ambulatory Visit | Attending: Radiation Oncology | Admitting: Radiation Oncology

## 2012-04-20 VITALS — BP 140/58 | HR 78 | Resp 16 | Ht 59.0 in | Wt 97.0 lb

## 2012-04-20 DIAGNOSIS — R918 Other nonspecific abnormal finding of lung field: Secondary | ICD-10-CM

## 2012-04-20 DIAGNOSIS — C3490 Malignant neoplasm of unspecified part of unspecified bronchus or lung: Secondary | ICD-10-CM

## 2012-04-20 NOTE — Progress Notes (Signed)
Spoke with pt at [facility] today.  Educational/reosurce given and explained regarding lung cancer.

## 2012-04-20 NOTE — Progress Notes (Signed)
                   [address]            [place] [address]          [phone]      HPI:  The patient returns today to discuss results of her CT guided needle biopsy of the right upper lobe lung mass. This showed squamous cell carcinoma.  Current Outpatient Prescriptions  Medication Sig Dispense Refill  . alendronate (FOSAMAX) 70 MG tablet Take 70 mg by mouth See admin instructions. On Sundays; Take with a full glass of water on an empty stomach.      . methotrexate 2.5 MG tablet Take 17.5 mg by mouth once a week. Takes on Sunday; Takes 7 tablets total  (17.5mg  ) every Sunday      . Multiple Vitamin (MULTIVITAMIN WITH MINERALS) TABS Take 1 tablet by mouth daily.      . predniSONE (DELTASONE) 1 MG tablet Take 4 mg by mouth daily.       . traMADol (ULTRAM) 50 MG tablet Take 50 mg by mouth every 6 (six) hours as needed. For pain         Physical Exam: BP 140/58  Pulse 78  Resp 16  Ht 4\' 11"  (1.499 m)  Wt 97 lb (43.999 kg)  BMI 19.59 kg/m2  SpO2 98% She looks well. Her lungs are clear.   Impression:  She has a 2.5 cm squamous cell carcinoma in the right upper lobe. This would require lobectomy for complete removal and I don't think she is a candidate for that at 79 years old. I think consideration of radiation therapy would be the best option for her. She also has a 1 cm nodule in the superior segment of the left lower lobe that did not have hypermetabolic activity on PET scan. This will require continued followup.  Plan:  She will be referred to Dr. Kathrynn Running for consideration of radiation therapy.

## 2012-04-20 NOTE — Progress Notes (Signed)
Radiation Oncology         [phone]) [phone] ________________________________  Initial outpatient Consultation  Name: [name] MRN: [id number]  Date: 04/20/2012  DOB: 1930/04/18  YQ:IHKV,QQVZDGLOV, MD  [name], MD   REFERRING PHYSICIAN: [name], MD  DIAGNOSIS: 79 year old woman with clinical stage IA squamous cell carcinoma of the right upper lung  HISTORY OF PRESENT ILLNESS::[name] is a 79 y.o. female who presented to her primary physician on 02/28/2012 reporting a two-week history of left flank pain made worse by lying on her left side. She had a chest x-ray performed which showed no evidence of rib fracture but did show COPD as well as some opacity in the right upper lung field that could be scarring or a lung lesion. There were also several left lung nodules with at least one or two of them appearing to be calcified. She underwent a CT scan of the chest 03/10/2012 which showed a 1.3 x 2.7 x 1.9 cm cavitary lesion in the right upper lobe that is somewhat thick walled. There is also a spiculated lesion within the superior segment of the left lower lobe measuring 0.7 x 1.1 x 0.6 cm.   PET was done and showed Right upper lobe lung nodule which measures 2.6 x 1.7 cm and a S.U.V. max of 6.1 on image 74/series 2. 2.7 x 1.3 cm on the priorCT. CT biopsy was done on 12/26 showing squamous cell ca.  She was presented in our [facility] conference today and she comes to the [facility] multidisciplinary lung clinic today to be seen by Dr. Laneta Simmers and me.  PREVIOUS RADIATION THERAPY: No  PAST MEDICAL HISTORY:  has a past medical history of Hip pain, right; Osteoporosis; RA (rheumatoid arthritis); PAC (premature atrial contraction); Shingles; HLD (hyperlipidemia); Hernia, umbilical; COPD (chronic obstructive pulmonary disease); and Lung mass.    PAST SURGICAL HISTORY:No past surgical history on file.  FAMILY HISTORY: family history is not on file.  SOCIAL HISTORY:  reports that she quit  smoking about 2 years ago. Her smoking use included Cigarettes. She has never used smokeless tobacco. She reports that she does not drink alcohol or use illicit drugs.  ALLERGIES: Latex; Boniva; Darvocet; Erythromycin; Morphine and related; Oxycontin; Sulfa antibiotics; and Vicodin  MEDICATIONS:  Current Outpatient Prescriptions  Medication Sig Dispense Refill  . alendronate (FOSAMAX) 70 MG tablet Take 70 mg by mouth See admin instructions. On Sundays; Take with a full glass of water on an empty stomach.      . methotrexate 2.5 MG tablet Take 17.5 mg by mouth once a week. Takes on Sunday; Takes 7 tablets total  (17.5mg  ) every Sunday      . Multiple Vitamin (MULTIVITAMIN WITH MINERALS) TABS Take 1 tablet by mouth daily.      . predniSONE (DELTASONE) 1 MG tablet Take 4 mg by mouth daily.       . traMADol (ULTRAM) 50 MG tablet Take 50 mg by mouth every 6 (six) hours as needed. For pain        REVIEW OF SYSTEMS:  A 15 point review of systems is documented in the electronic medical record. This was obtained by the nursing staff. However, I reviewed this with the patient to discuss relevant findings and make appropriate changes.  A comprehensive review of systems was negative.   PHYSICAL EXAM:  vitals were not taken for this visit.  The patient is a frail elderly woman in NAD.  Per thoracic surgery Her lungs are  clear.  LABORATORY DATA:  Lab Results  Component Value Date   WBC 6.9 04/13/2012   HGB 11.1* 04/13/2012   HCT 34.0* 04/13/2012   MCV 101.8* 04/13/2012   PLT 202 04/13/2012   Lab Results  Component Value Date   NA 133* 07/31/2009   K 3.8 07/31/2009   CL 103 07/31/2009   CO2 25 07/31/2009   No results found for this basename: ALT, AST, GGT, ALKPHOS, BILITOT     RADIOGRAPHY: Dg Chest 1 View  04/13/2012  *RADIOLOGY REPORT*  Clinical Data: Post biopsy  CHEST - 1 VIEW  Comparison: 02/28/2012  Findings: No evidence of pneumothorax status post right lung biopsy.  Right upper lobe lung  mass is again noted.  Chronic changes and severe hyperaeration are stable.  Levoscoliosis at the thoracolumbar junction.  IMPRESSION: No pneumothorax post right upper lobe lung biopsy.   Original Report Authenticated By: Jolaine Click, M.D.    Nm Pet Image Initial (pi) Skull Base To Thigh  04/03/2012  *RADIOLOGY REPORT*  Clinical Data: Initial treatment strategy for right upper lobe lung mass.  Rule out metastasis.  NUCLEAR MEDICINE PET SKULL BASE TO THIGH  Fasting Blood Glucose:  89  Technique:  19.1 mCi F-18 FDG was injected intravenously. CT data was obtained and used for attenuation correction and anatomic localization only.  (This was not acquired as a diagnostic CT examination.) Additional exam technical data entered on technologist worksheet.  Comparison:  03/10/2012 chest CT.  No prior PET.  Abdominal pelvic CT of 12/06/2003 is reviewed (prior report not available).  Findings:  Neck: No abnormal hypermetabolism.  Chest:  Right upper lobe lung nodule which measures 2.6 x 1.7 cm and a S.U.V. max of 6.1 on image 74/series 2. 2.7 x 1.3 cm on the priorCT.  Resolution of previously described central cavitation. No surrounding inflammation.  No abnormal thoracic nodal activity.  The superior segment left lower lobe nodule described on the prior exam is similar in size at approximately 1.0 cm.  No corresponding hypermetabolism.  Abdomen/Pelvis:  No abnormal hypermetabolism.  Skeleton:  No hypermetabolic osseous foci  CT  images performed for attenuation correction demonstrate no significant findings within the neck.  Cardiomegaly and a pectus excavatum deformity. Multivessel coronary artery atherosclerosis. Centrilobular emphysema.  Surgical sutures within the right upper lobe just cephalad the above described lung lesion.  There is also probable scarring at the left lung base laterally.  Extensive uterine calcifications which are nonspecific but could be vascular. No well-defined uterine mass.  Moderate  osteopenia.  IMPRESSION:  1.  Hypermetabolic right upper lobe pulmonary nodule; similar in size with interval resolution of cavitation since 03/10/2012. Suspicious for primary bronchogenic carcinoma.   An infectious or inflammatory process is felt much less likely. 2.  No evidence of thoracic nodal or extrathoracic hypermetabolic metastasis. 3.  The superior segment left lower lobe pulmonary nodule  is without hypermetabolic correlate but is at the low end of resolution for PET. Recommend attention on follow-up.   Original Report Authenticated By: Jeronimo Greaves, M.D.    Ct Biopsy  04/13/2012  *RADIOLOGY REPORT*  Indication: Hypermetabolic right upper lobe pulmonary nodule worrisome for bronchogenic carcinoma.  CT GUIDED RIGHT UPPER LOBE NODULE FINE NEEDLE ASPIRATION AND CORE NEEDLE BIOPSY  Comparisons: Chest CT - 03/10/2012; PET CT - 04/03/2012  Intravenous medications: Fentanyl 37.5 mcg IV; Versed 1 mg IV  Contrast: None  Sedation time: 20 minutes  CT Fluoroscopy time: 10 seconds  Complications: None immediate  TECHNIQUE/FINDINGS:  Informed  consent was obtained from the patient following an explanation of the procedure, risks, benefits and alternatives. The patient understands, agrees and consents for the procedure. All questions were addressed.  A time out was performed prior to the initiation of the procedure.  The patient was positioned supine on the CT table and a limited chest CT was performed for procedural planning demonstrating grossly unchanged appearance of approximately 3.1 x 1.7 cm nodule within the right upper lobe (image five, series 2).  The operative site was prepped and draped in the usual sterile fashion.  Under sterile conditions and local anesthesia, a 17 gauge coaxial needle was advanced into the peripheral aspect of the nodule.  Positioning was confirmed with intermittent CT fluoroscopy and 2 FNA samples were obtained with the use of a 20 gauge Francine needle.  Quick stain pathologic review  confirmed the acquisition of lesional tissue.  This was followed by the acquisition of 2 core needle biopsies with an 18 gauge core needle biopsy device. Given concern for possible infection, a final aspiration was performed through the outer 17 gauge coaxial needle and sent to the laboratory for microbiology.  Limited post procedural chest CT was negative for pneumothorax or additional complication.  The co-axial needle was removed and hemostasis was achieved with manual compression.  A dressing was placed.  The patient tolerated the procedure well without immediate postprocedural complication.  The patient was escorted to have an upright chest radiograph.  IMPRESSION:  Technically successful CT guided fine needle aspiration and core needle biopsy of hypermetabolic right upper lobe pulmonary nodule. Note, given concern for possible infectious etiology, a separate aspirate was sent to the laboratory for microbiology analysis.   Original Report Authenticated By: Tacey Ruiz, MD       IMPRESSION: Ms. Berko is a very nice 79 yo woman with a clinical stage IA squamous cell carcinoma of the right upper lung.  She could potentially be a candidate for surgical resection versus radiotherapy using stereotactic body radiotherapy.  PLAN:Today, I talked to the patient and family about the findings and work-up thus far.  We discussed the natural history of disease and general treatment, highlighting the role or radiotherapy in the management.  We discussed the available radiation techniques, and focused on the details of logistics and delivery.  We reviewed the anticipated acute and late sequelae associated with radiation in this setting.  The patient was encouraged to ask questions that I answered to the best of my ability.  The patient would like to proceed with radiation and will be scheduled for CT simulation.  I spent 60 minutes minutes face to face with the patient and more than 50% of that time was spent in  counseling and/or coordination of care.    ------------------------------------------------  Artist Pais. Kathrynn Running, M.D.

## 2012-04-21 ENCOUNTER — Ambulatory Visit
Admission: RE | Admit: 2012-04-21 | Discharge: 2012-04-21 | Disposition: A | Discharge status: Home / Self Care | Payer: Medicare Other | Source: Ambulatory Visit | Attending: Radiation Oncology | Admitting: Radiation Oncology

## 2012-04-21 ENCOUNTER — Telehealth: Payer: Self-pay | Admitting: Radiation Oncology

## 2012-04-21 DIAGNOSIS — Z510 Encounter for antineoplastic radiation therapy: Secondary | ICD-10-CM | POA: Insufficient documentation

## 2012-04-21 DIAGNOSIS — C3410 Malignant neoplasm of upper lobe, unspecified bronchus or lung: Secondary | ICD-10-CM

## 2012-04-21 NOTE — Progress Notes (Signed)
  Radiation Oncology         (336) [phone] ________________________________  Name: [name] MRN: [id number]  Date: 04/21/2012  DOB: 14-Feb-1930  STEREOTACTIC BODY RADIOTHERAPY SIMULATION AND TREATMENT PLANNING NOTE  DIAGNOSIS:  79 year old woman with a 2.7 cm clinical stage IA squamous cell carcinoma of the right upper lung  NARRATIVE:  The patient was brought to the CT Simulation planning suite.  Identity was confirmed.  All relevant records and images related to the planned course of therapy were reviewed.  The patient freely provided informed written consent to proceed with treatment after reviewing the details related to the planned course of therapy. The consent form was witnessed and verified by the simulation staff.  Then, the patient was set-up in a stable reproducible  supine position for radiation therapy.  A BodyFix immobilization pillow was fabricated for reproducible positioning.  Then I personally applied the abdominal compression paddle to limit respiratory excursion.  4D respiratoy motion management CT images were obtained.  Surface markings were placed.  The CT images were loaded into the planning software.  Then, using Cine, MIP, and standard views, the internal target volume (ITV) and planning target volumes (PTV) were delinieated, and avoidance structures were contoured.  Treatment planning then occurred.  The radiation prescription was entered and confirmed.  A total of two complex treatment devices were fabricated in the form of the BodyFix immobilization pillow and a neck accuform cushion.  I have requested : 3D Simulation  I have requested a DVH of the following structures: Heart, Lungs, Esophagus, Chest Wall, Brachial Plexus, Major Blood Vessels, and targets.  PLAN:  The patient will receive 54 Gy in 3 fractions.  ________________________________  Artist Pais Kathrynn Running, M.D.

## 2012-04-21 NOTE — Telephone Encounter (Signed)
Met w patient to discuss RO billing. Pt had no financial concerns today and was in a hurry.  Dx: 793.19  Attending Rad: MM  Rad Tx: SRS x3

## 2012-05-02 ENCOUNTER — Encounter: Payer: Self-pay | Admitting: Radiation Oncology

## 2012-05-02 ENCOUNTER — Ambulatory Visit
Admission: RE | Admit: 2012-05-02 | Discharge: 2012-05-02 | Disposition: A | Discharge status: Home / Self Care | Payer: Medicare Other | Source: Ambulatory Visit | Attending: Radiation Oncology | Admitting: Radiation Oncology

## 2012-05-04 ENCOUNTER — Ambulatory Visit
Admission: RE | Admit: 2012-05-04 | Discharge: 2012-05-04 | Disposition: A | Discharge status: Home / Self Care | Payer: Medicare Other | Source: Ambulatory Visit | Attending: Radiation Oncology | Admitting: Radiation Oncology

## 2012-05-04 ENCOUNTER — Encounter: Payer: Self-pay | Admitting: Radiation Oncology

## 2012-05-04 VITALS — BP 160/58 | HR 61 | Temp 97.7°F | Resp 20 | Wt 93.1 lb

## 2012-05-04 DIAGNOSIS — C3410 Malignant neoplasm of upper lobe, unspecified bronchus or lung: Secondary | ICD-10-CM

## 2012-05-04 NOTE — Progress Notes (Signed)
Patient here weekly rad txs right lung SBRT 2/3 completed last tx next Tuesday, Patient alert, oriented x3, patient voiced no c/o pain, no sob, no nausea, no coughing, eating well, no difficulty swallowing 2:06 PM

## 2012-05-04 NOTE — Progress Notes (Signed)
  Radiation Oncology         (336) [phone] ________________________________  Name: [name] MRN: [id number]  Date: 05/04/2012  DOB: 1929-12-23  Weekly Radiation Therapy Management  Current Dose: 36 Gy     Planned Dose:  54 Gy  Narrative . . . . . . . . The patient presents for routine under treatment assessment.                                                   Patient here weekly rad txs right lung SBRT 2/3 completed last tx next Tuesday,  Patient alert, oriented x3, patient voiced no c/o pain, no sob, no nausea, no coughing, eating well, no difficulty swallowing    The patient is without complaint.                                 Set-up films were reviewed.                                 The chart was checked. Physical Findings. . .  weight is 93 lb 1.6 oz (42.23 kg). Her oral temperature is 97.7 F (36.5 C). Her blood pressure is 160/58 and her pulse is 61. Her respiration is 20 and oxygen saturation is 99%. . Weight essentially stable.  No significant changes. Impression . . . . . . . The patient is  tolerating radiation. Plan . . . . . . . . . . . . Continue treatment as planned.  ________________________________  Artist Pais. Kathrynn Running, M.D.

## 2012-05-05 NOTE — Progress Notes (Signed)
  Radiation Oncology         (336) [phone] ________________________________  Name: [name] MRN: [id number]  Date: 05/04/2012  DOB: 12-12-1929  Stereotactic Body Radiotherapy Treatment Procedure Note  NARRATIVE:  [name] was brought to the stereotactic radiation treatment machine and placed supine on the CT couch. The patient was set up for stereotactic body radiotherapy on the body fix pillow.  3D TREATMENT PLANNING AND DOSIMETRY:  The patient's radiation plan was reviewed and approved prior to starting treatment.  It showed 3-dimensional radiation distributions overlaid onto the planning CT.  The [facility] for the target structures as well as the organs at risk were reviewed. The documentation of this is filed in the radiation oncology EMR.  SIMULATION VERIFICATION:  The patient underwent CT imaging on the treatment unit.  These were carefully aligned to document that the ablative radiation dose would cover the target volume and maximally spare the nearby organs at risk according to the planned distribution.  SPECIAL TREATMENT PROCEDURE: [name] received high dose ablative stereotactic body radiotherapy to the planned target volume without unforeseen complications. Treatment was delivered uneventfully. The high doses associated with stereotactic body radiotherapy and the significant potential risks require careful treatment set up and patient monitoring constituting a special treatment procedure   STEREOTACTIC TREATMENT MANAGEMENT:  Following delivery, the patient was evaluated clinically. The patient tolerated treatment without significant acute effects, and was discharged to home in stable condition.    PLAN: Continue treatment as planned.  ________________________________  Artist Pais. Kathrynn Running, M.D.

## 2012-05-09 ENCOUNTER — Encounter: Payer: Self-pay | Admitting: Radiation Oncology

## 2012-05-09 ENCOUNTER — Ambulatory Visit
Admission: RE | Admit: 2012-05-09 | Discharge: 2012-05-09 | Disposition: A | Discharge status: Home / Self Care | Payer: Medicare Other | Source: Ambulatory Visit | Attending: Radiation Oncology | Admitting: Radiation Oncology

## 2012-05-10 NOTE — Progress Notes (Signed)
  Radiation Oncology         (336) [phone] ________________________________  Name: [name] MRN: [id number]  Date: 05/09/2012  DOB: 1929/07/03  Stereotactic Body Radiotherapy Treatment Procedure Note  NARRATIVE:  [name] was brought to the stereotactic radiation treatment machine and placed supine on the CT couch. The patient was set up for her final stereotactic body radiotherapy on the body fix pillow.  3D TREATMENT PLANNING AND DOSIMETRY:  The patient's radiation plan was reviewed and approved prior to starting treatment.  It showed 3-dimensional radiation distributions overlaid onto the planning CT.  The [facility] for the target structures as well as the organs at risk were reviewed. The documentation of this is filed in the radiation oncology EMR.  SIMULATION VERIFICATION:  The patient underwent CT imaging on the treatment unit.  These were carefully aligned to document that the ablative radiation dose would cover the target volume and maximally spare the nearby organs at risk according to the planned distribution.  SPECIAL TREATMENT PROCEDURE: [name] received high dose ablative stereotactic body radiotherapy to the planned target volume without unforeseen complications. Treatment was delivered uneventfully. The high doses associated with stereotactic body radiotherapy and the significant potential risks require careful treatment set up and patient monitoring constituting a special treatment procedure   STEREOTACTIC TREATMENT MANAGEMENT:  Following delivery, the patient was evaluated clinically. The patient tolerated treatment without significant acute effects, and was discharged to home in stable condition.    PLAN: Follow-up in one month.  ________________________________  Artist Pais. Kathrynn Running, M.D.

## 2012-05-10 NOTE — Progress Notes (Signed)
  Radiation Oncology         (336) [phone] ________________________________  Name: [name] MRN: [id number]  Date: 05/09/2012  DOB: [birth year]  End of Treatment Note  Diagnosis:  79 year old woman with a 2.7 cm clinical stage IA squamous cell carcinoma of the right upper lung   Indication for treatment:  Curative       Radiation treatment dates:  05/02/2012, 05/04/2012, 05/09/2012  Site/dose:   The tumor was treated to 54 Gy in 3 fractions of 18 Gy.  Beams/energy: The patient was set up for stereotactic body radiotherapy on the body fix pillow. [name] received high dose ablative stereotactic body radiotherapy to the planned target volume using 2 VMAT Arc fields delivering 10 MV photons in the flattening filter free mode.  Narrative: The patient tolerated radiation treatment relatively well.   No acute complications were noted.  Plan: The patient has completed radiation treatment. The patient will return to radiation oncology clinic for routine followup in one month. I advised her to call or return sooner if she has any questions or concerns related to her recovery or treatment. ________________________________  Artist Pais. Kathrynn Running, M.D.

## 2012-05-10 NOTE — Progress Notes (Signed)
  Radiation Oncology         [phone]) [phone] ________________________________  Name: Kaena Santori MRN: [id number]  Date: 05/02/2012  DOB: 08-16-1929  Stereotactic Body Radiotherapy Treatment Procedure Note (Fraction #1)  NARRATIVE:  Creedence Heiss was brought to the stereotactic radiation treatment machine and placed supine on the CT couch. The patient was set up for stereotactic body radiotherapy on the body fix pillow.  3D TREATMENT PLANNING AND DOSIMETRY:  The patient's radiation plan was reviewed and approved prior to starting treatment.  It showed 3-dimensional radiation distributions overlaid onto the planning CT.  The [facility] for the target structures as well as the organs at risk were reviewed. The documentation of this is filed in the radiation oncology EMR.  SIMULATION VERIFICATION:  The patient underwent CT imaging on the treatment unit.  These were carefully aligned to document that the ablative radiation dose would cover the target volume and maximally spare the nearby organs at risk according to the planned distribution.  SPECIAL TREATMENT PROCEDURE: Chyrl Elwell received high dose ablative stereotactic body radiotherapy to the planned target volume without unforeseen complications. Treatment was delivered uneventfully. The high doses associated with stereotactic body radiotherapy and the significant potential risks require careful treatment set up and patient monitoring constituting a special treatment procedure   STEREOTACTIC TREATMENT MANAGEMENT:  Following delivery, the patient was evaluated clinically. The patient tolerated treatment without significant acute effects, and was discharged to home in stable condition.    PLAN: Continue treatment as planned.

## 2012-05-31 ENCOUNTER — Encounter: Payer: Self-pay | Admitting: Radiation Oncology

## 2012-06-01 ENCOUNTER — Telehealth: Payer: Self-pay | Admitting: *Deleted

## 2012-06-01 ENCOUNTER — Ambulatory Visit: Admission: RE | Admit: 2012-06-01 | Payer: Medicare Other | Source: Ambulatory Visit | Admitting: Radiation Oncology

## 2012-06-01 HISTORY — DX: Personal history of irradiation: Z92.3

## 2012-06-01 NOTE — Telephone Encounter (Signed)
Called and spoke with patient, she asked [place] to call her daughter to cancel her appt and will reschedule her f/u , her daughter number is [phone], patient number is [phone], called and left voice message for daughter to call us [phone] if any questions, cancelled her mother's appt today due to the weather Will inform MD 10:07 AM

## 2012-06-08 ENCOUNTER — Encounter: Payer: Self-pay | Admitting: Radiation Oncology

## 2012-06-08 ENCOUNTER — Ambulatory Visit: Payer: Medicare Other | Admitting: Radiation Oncology

## 2012-06-08 ENCOUNTER — Ambulatory Visit
Admission: RE | Admit: 2012-06-08 | Discharge: 2012-06-08 | Disposition: A | Discharge status: Home / Self Care | Payer: Medicare Other | Source: Ambulatory Visit | Attending: Radiation Oncology | Admitting: Radiation Oncology

## 2012-06-08 VITALS — BP 183/61 | HR 77 | Temp 98.1°F | Resp 20 | Wt 93.2 lb

## 2012-06-08 DIAGNOSIS — C3410 Malignant neoplasm of upper lobe, unspecified bronchus or lung: Secondary | ICD-10-CM

## 2012-06-08 NOTE — Progress Notes (Signed)
  Radiation Oncology         [phone] ________________________________  Name: [name] MRN: [id number]  Date: 06/08/2012  DOB: [birth year]  Follow-Up Visit Note  CC: Lupita Raider, MD  [name], MD  Diagnosis:   79 year old woman with a 2.7 cm clinical stage IA squamous cell carcinoma of the right upper lung s/p curative SBRT to 54 Gy in 3 fractions of 18 Gy - 05/02/2012, 05/04/2012, 05/09/2012   Interval Since Last Radiation:  1  months  Narrative:  The patient returns today for routine follow-up.  She has some fatigue, but no pulmonary or esophageal symptoms.                              ALLERGIES:  is allergic to latex; boniva; darvocet; erythromycin; morphine and related; oxycontin; sulfa antibiotics; and vicodin.  Meds: Current Outpatient Prescriptions  Medication Sig Dispense Refill  . alendronate (FOSAMAX) 70 MG tablet Take 70 mg by mouth See admin instructions. On Sundays; Take with a full glass of water on an empty stomach.      . methotrexate 2.5 MG tablet Take 17.5 mg by mouth once a week. Takes on Sunday; Takes 7 tablets total  (17.5mg  ) every Sunday      . Multiple Vitamin (MULTIVITAMIN WITH MINERALS) TABS Take 1 tablet by mouth daily.      . predniSONE (DELTASONE) 1 MG tablet Take 4 mg by mouth daily. Decreased to 3mg  now 05/04/12      . traMADol (ULTRAM) 50 MG tablet Take 50 mg by mouth every 6 (six) hours as needed. For pain       No current facility-administered medications for this encounter.    Physical Findings: The patient is in no acute distress. Patient is alert and oriented.  weight is 93 lb 3.2 oz (42.275 kg). Her oral temperature is 98.1 F (36.7 C). Her blood pressure is 183/61 and her pulse is 77. Her respiration is 20 and oxygen saturation is 98%. .  No significant changes.  Lab Findings: Lab Results  Component Value Date   WBC 6.9 04/13/2012   HGB 11.1* 04/13/2012   HCT 34.0* 04/13/2012   MCV 101.8* 04/13/2012   PLT 202 04/13/2012     Impression:  The patient is recovering from the effects of radiation.    Plan:  She'll need follow-up Chest CT in 1 month, then follow-up.  _____________________________________  Artist Pais. Kathrynn Running, M.D.

## 2012-06-08 NOTE — Progress Notes (Signed)
Patient here follow up s/p rad tx right upper lung 05/02/12, 05/04/12 & 05/09/12 Alert,oriented x3, dry cough, no sob 98% room air sats, patient back on prednisone tabs, appetite is good styates patient, drinking enough water/fluids, no c/o pain, slight fatigue 2:17 PM

## 2012-06-09 ENCOUNTER — Telehealth: Payer: Self-pay | Admitting: *Deleted

## 2012-06-09 NOTE — Telephone Encounter (Signed)
CALLED PATIENT TO INFORM OF TEST AND FU VISIT, LVM FOR A RETURN CALL 

## 2012-07-13 ENCOUNTER — Ambulatory Visit (HOSPITAL_COMMUNITY)
Admission: RE | Admit: 2012-07-13 | Discharge: 2012-07-13 | Disposition: A | Discharge status: Home / Self Care | Payer: Medicare Other | Source: Ambulatory Visit | Attending: Radiation Oncology | Admitting: Radiation Oncology

## 2012-07-13 ENCOUNTER — Encounter (HOSPITAL_COMMUNITY): Payer: Self-pay

## 2012-07-13 DIAGNOSIS — C3490 Malignant neoplasm of unspecified part of unspecified bronchus or lung: Secondary | ICD-10-CM | POA: Insufficient documentation

## 2012-07-13 DIAGNOSIS — C3410 Malignant neoplasm of upper lobe, unspecified bronchus or lung: Secondary | ICD-10-CM

## 2012-07-13 DIAGNOSIS — R911 Solitary pulmonary nodule: Secondary | ICD-10-CM | POA: Insufficient documentation

## 2012-07-13 HISTORY — DX: Malignant (primary) neoplasm, unspecified: C80.1

## 2012-07-20 ENCOUNTER — Ambulatory Visit
Admission: RE | Admit: 2012-07-20 | Discharge: 2012-07-20 | Disposition: A | Discharge status: Home / Self Care | Payer: Medicare Other | Source: Ambulatory Visit | Attending: Radiation Oncology | Admitting: Radiation Oncology

## 2012-07-20 ENCOUNTER — Encounter: Payer: Self-pay | Admitting: Radiation Oncology

## 2012-07-20 NOTE — Progress Notes (Signed)
Radiation Oncology         [phone] ________________________________  Name: [name] MRN: [id number]  Date: 07/20/2012  DOB: 1929-09-08  Follow-Up Visit Note  CC: Lupita Raider, MD  [name], MD  Diagnosis:   79 year old woman with a 2.7 cm clinical stage IA squamous cell carcinoma of the right upper lung  S/p Curative SBRT 05/02/2012, 05/04/2012, 05/09/2012 to 54 Gy in 3 fractions  (She has left lower lung nodule also, which was negative on PET and has not been biopsied)  Interval Since Last Radiation:  3  months  Narrative:  The patient returns today for routine follow-up.  She is essentially without complaint. She denies any bladder symptoms at present. She denies any chest wall pain.                              ALLERGIES:  is allergic to latex; boniva; darvocet; erythromycin; morphine and related; oxycontin; sulfa antibiotics; and vicodin.  Meds: Current Outpatient Prescriptions  Medication Sig Dispense Refill  . alendronate (FOSAMAX) 70 MG tablet Take 70 mg by mouth See admin instructions. On Sundays; Take with a full glass of water on an empty stomach.      . methotrexate 2.5 MG tablet Take 17.5 mg by mouth once a week. Takes on Sunday; Takes 7 tablets total  (17.5mg  ) every Sunday      . Multiple Vitamin (MULTIVITAMIN WITH MINERALS) TABS Take 1 tablet by mouth daily.      . predniSONE (DELTASONE) 1 MG tablet Take 4 mg by mouth daily. Decreased to 3mg  now 05/04/12      . traMADol (ULTRAM) 50 MG tablet Take 50 mg by mouth every 6 (six) hours as needed. For pain       No current facility-administered medications for this encounter.    Physical Findings: The patient is in no acute distress. Patient is alert and oriented. Patient does have a cyst on her posterior neck which shows no signs of infection.  weight is 92 lb 1.6 oz (41.776 kg). Her oral temperature is 98.2 F (36.8 C). Her blood pressure is 148/51 and her pulse is 66. Her respiration is 20 and oxygen  saturation is 100%. Marland Kitchen Respiratory effort is unremarkable.  No significant changes.  Lab Findings: Lab Results  Component Value Date   WBC 6.9 04/13/2012   HGB 11.1* 04/13/2012   HCT 34.0* 04/13/2012   MCV 101.8* 04/13/2012   PLT 202 04/13/2012    Radiographic Findings: Ct Chest Wo Contrast  07/13/2012  *RADIOLOGY REPORT*  Clinical Data: Lung cancer diagnosed 10/13 with radiation therapy completed 1/14.  Prior umbilical hernia repair.  CT CHEST WITHOUT CONTRAST  Technique:  Multidetector CT imaging of the chest was performed following the standard protocol without IV contrast.  Comparison: Plain films of 04/13/2012.  CT 03/10/2012.  PET 04/03/2012  Findings: Lungs/pleura: Mild centrilobular emphysema. Partially spiculated left lower lobe lung nodule which measures 1.1 x 0.6 cm on image 29/series 4.  Similar to 1.1 x 0.7 cm on the prior exam.  Scattered areas of probable scarring which are unchanged, including at the left lung apex. Calcified granulomas at the left lung base.  Soft tissue density at this site of right upper lobe initially cavitary nodule measures 2.3 x 0.9 cm on image 19/series 4.  This compares with 2.6 x 1.7 cm on the 04/03/2012 PET.  Surrounding mild volume loss with areas of scarring. Concurrent  surgical changes in this area of partial right upper lobectomy.  A left upper lobe interstitial and possible ground-glass opacity anteriorly on image 27/series 4 measures maximally 1.4 cm and is similar to on the prior exam.  No pleural fluid.  Heart/Mediastinum: No supraclavicular adenopathy.  Tortuous descending thoracic aorta.  Normal heart size, without pericardial effusion. Multivessel coronary artery atherosclerosis.  A mild pectus excavatum deformity.  Calcified nodes in the mediastinum are likely related to old granulomatous disease. No mediastinal or definite hilar adenopathy, given limitations of unenhanced CT.  Upper abdomen: Old granulomatous disease in the spleen.  Normal adrenal  glands.  Bones/Musculoskeletal:  Moderate convex left thoracolumbar spine curvature.  IMPRESSION:  1. Decreased soft tissue density in the the right upper lobe, at site of lung nodule.  This is presumably related to radiation change. 2.  No evidence of metastatic disease. 3.  Similar size of a spiculated left lower lobe lung nodule.  This warrants ongoing followup attention. 4.  Interstitial and possible ground-glass opacity in the anterior left upper lobe.  Favored to represent an area of scarring, and similar to on the prior.  This also warrants followup attention.   Original Report Authenticated By: Jeronimo Greaves, M.D.     Impression:  The patient is recovering from the effects of radiation.  Her followup CT shows improvement in the treated right upper lung mass. The left lower lung mass remains visible with no suspicious increase in size.  Plan:  Today, I ordered a followup chest CT without contrast in 3 months to continue to follow the treated right sided lung nodule and a smaller on biopsy done treated left lung nodule. I advised her to apply Neosporin topically with a cyst on the back of her neck and followup with her primary care physician if it persists.  _____________________________________  Artist Pais Kathrynn Running, M.D.

## 2012-07-20 NOTE — Progress Notes (Signed)
Follow up  S/p SRS lung, 05/02/12, 05/04/12, & 05/09/12 Alert,oriented x3, no c/o pain, last CT chest 07/13/12  No c/o pain, nausea, eating well states patient, concerned now about cyst?scabbed on back of her neck  no sob, 100% room air sats energy level good .

## 2012-07-21 ENCOUNTER — Telehealth: Payer: Self-pay | Admitting: *Deleted

## 2012-07-21 NOTE — Telephone Encounter (Signed)
CALLED PATIENT TO INFORM OF APPT. WITH DR. SHAW ON 07-21-12 AT 4:30 PM, SPOKE WITH PATIENT AND SHE IS AWARE OF THIS APPT.

## 2012-10-13 ENCOUNTER — Ambulatory Visit (HOSPITAL_COMMUNITY)
Admission: RE | Admit: 2012-10-13 | Discharge: 2012-10-13 | Disposition: A | Discharge status: Home / Self Care | Payer: Medicare Other | Source: Ambulatory Visit | Attending: Family Medicine | Admitting: Family Medicine

## 2012-10-13 ENCOUNTER — Other Ambulatory Visit (HOSPITAL_COMMUNITY): Payer: Self-pay | Admitting: Family Medicine

## 2012-10-13 DIAGNOSIS — R600 Localized edema: Secondary | ICD-10-CM

## 2012-10-13 DIAGNOSIS — M7989 Other specified soft tissue disorders: Secondary | ICD-10-CM

## 2012-10-13 NOTE — Progress Notes (Signed)
*  PRELIMINARY RESULTS* Vascular Ultrasound Left lower extremity venous duplex has been completed.  Preliminary findings: Left:  No evidence of DVT, superficial thrombosis, or Baker's cyst.   Shajuana Mclucas FRANCES 10/13/2012, 6:19 PM

## 2012-10-24 ENCOUNTER — Ambulatory Visit (HOSPITAL_COMMUNITY)
Admission: RE | Admit: 2012-10-24 | Discharge: 2012-10-24 | Disposition: A | Discharge status: Home / Self Care | Payer: Medicare Other | Source: Ambulatory Visit | Attending: Radiation Oncology | Admitting: Radiation Oncology

## 2012-10-24 DIAGNOSIS — J438 Other emphysema: Secondary | ICD-10-CM | POA: Insufficient documentation

## 2012-10-24 DIAGNOSIS — Z923 Personal history of irradiation: Secondary | ICD-10-CM | POA: Insufficient documentation

## 2012-10-24 DIAGNOSIS — C3410 Malignant neoplasm of upper lobe, unspecified bronchus or lung: Secondary | ICD-10-CM | POA: Insufficient documentation

## 2012-10-24 DIAGNOSIS — M4120 Other idiopathic scoliosis, site unspecified: Secondary | ICD-10-CM | POA: Insufficient documentation

## 2012-10-24 DIAGNOSIS — I7090 Unspecified atherosclerosis: Secondary | ICD-10-CM | POA: Insufficient documentation

## 2012-11-02 ENCOUNTER — Encounter: Payer: Self-pay | Admitting: Radiation Oncology

## 2012-11-02 ENCOUNTER — Ambulatory Visit
Admission: RE | Admit: 2012-11-02 | Discharge: 2012-11-02 | Disposition: A | Discharge status: Home / Self Care | Payer: Medicare Other | Source: Ambulatory Visit | Attending: Radiation Oncology | Admitting: Radiation Oncology

## 2012-11-02 NOTE — Progress Notes (Signed)
[name] here with her daughter for follow up after treatment to her left lower lung.  She denies pain.  She does have a cough with white sputum.  She denies shortness of breath and nausea.

## 2012-11-02 NOTE — Progress Notes (Signed)
Radiation Oncology         [phone] ________________________________  Name: [name] MRN: [id number]  Date: 11/02/2012  DOB: 02-Jan-1930  Follow-Up Visit Note  CC: Lupita Raider, MD  [name], MD  Diagnosis:   79 year old woman with a 2.7 cm clinical stage IA squamous cell carcinoma of the right upper lung S/p Curative SBRT 05/02/2012, 05/04/2012, 05/09/2012 to 54 Gy in 3 fractions (She has left lower lung nodule also, which was negative on PET and has not been biopsied)  Interval Since Last Radiation:  6  months  Narrative:  The patient returns today for routine follow-up.  [name] here with her daughter for follow up after treatment to her left lower lung. She denies pain. She does have a cough with white sputum. She denies shortness of breath and nausea.  She recently had followup chest CT and returns today to review the results.                            ALLERGIES:  is allergic to latex; boniva; darvocet; erythromycin; morphine and related; oxycontin; sulfa antibiotics; and vicodin.  Meds: Current Outpatient Prescriptions  Medication Sig Dispense Refill  . alendronate (FOSAMAX) 70 MG tablet Take 70 mg by mouth See admin instructions. On Sundays; Take with a full glass of water on an empty stomach.      . furosemide (LASIX) 20 MG tablet 20 mg daily as needed.       . methotrexate 2.5 MG tablet Take 17.5 mg by mouth once a week. Takes on Sunday; Takes 6 tablets total  (17.5mg  ) every Sunday      . Multiple Vitamin (MULTIVITAMIN WITH MINERALS) TABS Take 1 tablet by mouth daily.      . predniSONE (DELTASONE) 1 MG tablet Take 4 mg by mouth daily. Decreased to 3mg  now 05/04/12      . traMADol (ULTRAM) 50 MG tablet Take 50 mg by mouth every 6 (six) hours as needed. For pain       No current facility-administered medications for this encounter.    Physical Findings: The patient is in no acute distress. Patient is alert and oriented.  height is 4\' 11"  (1.499 m) and  weight is 91 lb 3.2 oz (41.368 kg). Her temperature is 98.1 F (36.7 C). Her blood pressure is 148/74 and her pulse is 73. Her oxygen saturation is 99%. .  No significant changes.  Lab Findings: Lab Results  Component Value Date   WBC 6.9 04/13/2012   HGB 11.1* 04/13/2012   HCT 34.0* 04/13/2012   MCV 101.8* 04/13/2012   PLT 202 04/13/2012    Radiographic Findings: Ct Chest Wo Contrast  10/24/2012   *RADIOLOGY REPORT*  Clinical Data: Right lung cancer.  Status post radiation therapy. Restaging.  CT CHEST WITHOUT CONTRAST  Technique:  Multidetector CT imaging of the chest was performed following the standard protocol without IV contrast.  Comparison: 07/13/2012  Findings: A similar prominent atherosclerosis to priors.  Calcified granulomas noted in the left lung.  No pathologic thoracic adenopathy observed.  Oval shaped density, right upper lobe, 2.4 x 0.7 cm, previously 2.6 x 0.9 cm by my measurements.  Adjacent scarring and volume loss noted.  An oval shaped density in the left lower lobe measures 1.1 x 0.7 cm, and formerly the same when measured in a similar fashion this lesion was present but not hypermetabolic on the PET CT of 04/03/2002  Stable  slight nodularity in the left lung apex.  Paraseptal emphysema noted.  There is levoconvex thoracic scoliosis and dextroconvex lumbar scoliosis.  Crescentic ground-glass opacity anteriorly in the left upper lobe, no change from earliest available chest CT which was 03/10/2012, likely merits periodic observation.  IMPRESSION:  1. Further reduced size of the oval shaped right upper lobe density. Other lesions appear benign or stable.  The anterior left upper lobe crescentic ground-glass opacity likely merits observation to exclude low grade adenocarcinoma.  The left lower lobe nodule is stable and was not hypermetabolic on PET CT, but also warrants periodic observation given the patient's history. 2.  Paraseptal emphysema. 3.  Scoliosis.   Original Report  Authenticated By: Gaylyn Rong, M.D.   Impression:  The patient is continuing to response to radiation right upper lung and shows no evidence of active disease at present.  Plan:  We will set up chest CT in 6 months followed by a visit in the office to review the results.  _____________________________________  Artist Pais Kathrynn Running, M.D.

## 2012-11-06 ENCOUNTER — Telehealth: Payer: Self-pay | Admitting: *Deleted

## 2012-11-06 NOTE — Telephone Encounter (Signed)
CALLED PATIENT TO INFORM OF TEST AND FU FOR JAN. 2015, SPOKE WITH PATIENT AND SHE IS AWARE OF THESE APPTS.

## 2012-11-13 NOTE — Progress Notes (Signed)
Patient requested a copy of AVS.

## 2012-11-13 NOTE — Addendum Note (Signed)
Encounter addended by: Agnes Lawrence, RN on: 11/13/2012  2:08 PM<BR>     Documentation filed: Notes Section

## 2012-12-23 ENCOUNTER — Encounter (HOSPITAL_COMMUNITY): Payer: Self-pay | Admitting: Emergency Medicine

## 2012-12-23 ENCOUNTER — Inpatient Hospital Stay (HOSPITAL_COMMUNITY)
Admission: EM | Admit: 2012-12-23 | Discharge: 2012-12-28 | DRG: 602 | Disposition: A | Discharge status: Skilled Nursing Facility | Payer: Medicare Other | Attending: Internal Medicine | Admitting: Internal Medicine

## 2012-12-23 ENCOUNTER — Emergency Department (HOSPITAL_COMMUNITY): Payer: Medicare Other

## 2012-12-23 DIAGNOSIS — L03119 Cellulitis of unspecified part of limb: Principal | ICD-10-CM | POA: Diagnosis present

## 2012-12-23 DIAGNOSIS — R041 Hemorrhage from throat: Secondary | ICD-10-CM | POA: Diagnosis present

## 2012-12-23 DIAGNOSIS — L0390 Cellulitis, unspecified: Secondary | ICD-10-CM | POA: Diagnosis present

## 2012-12-23 DIAGNOSIS — M069 Rheumatoid arthritis, unspecified: Secondary | ICD-10-CM | POA: Diagnosis present

## 2012-12-23 DIAGNOSIS — K120 Recurrent oral aphthae: Secondary | ICD-10-CM | POA: Diagnosis present

## 2012-12-23 DIAGNOSIS — F339 Major depressive disorder, recurrent, unspecified: Secondary | ICD-10-CM | POA: Diagnosis present

## 2012-12-23 DIAGNOSIS — E876 Hypokalemia: Secondary | ICD-10-CM | POA: Diagnosis present

## 2012-12-23 DIAGNOSIS — D638 Anemia in other chronic diseases classified elsewhere: Secondary | ICD-10-CM | POA: Diagnosis present

## 2012-12-23 DIAGNOSIS — I491 Atrial premature depolarization: Secondary | ICD-10-CM | POA: Diagnosis present

## 2012-12-23 DIAGNOSIS — J392 Other diseases of pharynx: Secondary | ICD-10-CM | POA: Diagnosis present

## 2012-12-23 DIAGNOSIS — C3410 Malignant neoplasm of upper lobe, unspecified bronchus or lung: Secondary | ICD-10-CM | POA: Diagnosis present

## 2012-12-23 DIAGNOSIS — Z923 Personal history of irradiation: Secondary | ICD-10-CM

## 2012-12-23 DIAGNOSIS — L02419 Cutaneous abscess of limb, unspecified: Principal | ICD-10-CM | POA: Diagnosis present

## 2012-12-23 DIAGNOSIS — IMO0002 Reserved for concepts with insufficient information to code with codable children: Secondary | ICD-10-CM

## 2012-12-23 DIAGNOSIS — E785 Hyperlipidemia, unspecified: Secondary | ICD-10-CM | POA: Diagnosis present

## 2012-12-23 DIAGNOSIS — Z87891 Personal history of nicotine dependence: Secondary | ICD-10-CM

## 2012-12-23 DIAGNOSIS — C3411 Malignant neoplasm of upper lobe, right bronchus or lung: Secondary | ICD-10-CM | POA: Diagnosis present

## 2012-12-23 DIAGNOSIS — L0291 Cutaneous abscess, unspecified: Secondary | ICD-10-CM

## 2012-12-23 DIAGNOSIS — F329 Major depressive disorder, single episode, unspecified: Secondary | ICD-10-CM | POA: Diagnosis present

## 2012-12-23 DIAGNOSIS — F3289 Other specified depressive episodes: Secondary | ICD-10-CM | POA: Diagnosis present

## 2012-12-23 DIAGNOSIS — Z79899 Other long term (current) drug therapy: Secondary | ICD-10-CM

## 2012-12-23 DIAGNOSIS — M810 Age-related osteoporosis without current pathological fracture: Secondary | ICD-10-CM | POA: Diagnosis present

## 2012-12-23 DIAGNOSIS — J4489 Other specified chronic obstructive pulmonary disease: Secondary | ICD-10-CM | POA: Diagnosis present

## 2012-12-23 DIAGNOSIS — D696 Thrombocytopenia, unspecified: Secondary | ICD-10-CM | POA: Diagnosis present

## 2012-12-23 DIAGNOSIS — D649 Anemia, unspecified: Secondary | ICD-10-CM

## 2012-12-23 DIAGNOSIS — E43 Unspecified severe protein-calorie malnutrition: Secondary | ICD-10-CM | POA: Insufficient documentation

## 2012-12-23 DIAGNOSIS — F32A Depression, unspecified: Secondary | ICD-10-CM

## 2012-12-23 DIAGNOSIS — J449 Chronic obstructive pulmonary disease, unspecified: Secondary | ICD-10-CM | POA: Diagnosis present

## 2012-12-23 DIAGNOSIS — R042 Hemoptysis: Secondary | ICD-10-CM

## 2012-12-23 LAB — URINALYSIS, ROUTINE W REFLEX MICROSCOPIC
Bilirubin Urine: NEGATIVE
Glucose, UA: NEGATIVE mg/dL
Ketones, ur: NEGATIVE mg/dL
Nitrite: NEGATIVE
Protein, ur: NEGATIVE mg/dL
Specific Gravity, Urine: 1.015 (ref 1.005–1.030)
Urobilinogen, UA: 0.2 mg/dL (ref 0.0–1.0)
pH: 6.5 (ref 5.0–8.0)

## 2012-12-23 LAB — CBC
HCT: 28.4 % — ABNORMAL LOW (ref 36.0–46.0)
Hemoglobin: 9.9 g/dL — ABNORMAL LOW (ref 12.0–15.0)
MCH: 34.4 pg — ABNORMAL HIGH (ref 26.0–34.0)
MCHC: 34.9 g/dL (ref 30.0–36.0)
MCV: 98.6 fL (ref 78.0–100.0)
Platelets: 33 10*3/uL — ABNORMAL LOW (ref 150–400)
RBC: 2.88 MIL/uL — ABNORMAL LOW (ref 3.87–5.11)
RDW: 13.2 % (ref 11.5–15.5)
WBC: 8.5 10*3/uL (ref 4.0–10.5)

## 2012-12-23 LAB — CBC WITH DIFFERENTIAL/PLATELET
Basophils Absolute: 0 10*3/uL (ref 0.0–0.1)
Basophils Relative: 0 % (ref 0–1)
Eosinophils Absolute: 0 10*3/uL (ref 0.0–0.7)
Eosinophils Relative: 1 % (ref 0–5)
HCT: 29.6 % — ABNORMAL LOW (ref 36.0–46.0)
Hemoglobin: 10.2 g/dL — ABNORMAL LOW (ref 12.0–15.0)
Lymphocytes Relative: 6 % — ABNORMAL LOW (ref 12–46)
Lymphs Abs: 0.5 10*3/uL — ABNORMAL LOW (ref 0.7–4.0)
MCH: 34.1 pg — ABNORMAL HIGH (ref 26.0–34.0)
MCHC: 34.5 g/dL (ref 30.0–36.0)
MCV: 99 fL (ref 78.0–100.0)
Monocytes Absolute: 0 10*3/uL — ABNORMAL LOW (ref 0.1–1.0)
Monocytes Relative: 0 % — ABNORMAL LOW (ref 3–12)
Neutro Abs: 7.5 10*3/uL (ref 1.7–7.7)
Neutrophils Relative %: 93 % — ABNORMAL HIGH (ref 43–77)
Platelets: 37 10*3/uL — ABNORMAL LOW (ref 150–400)
RBC: 2.99 MIL/uL — ABNORMAL LOW (ref 3.87–5.11)
RDW: 13.4 % (ref 11.5–15.5)
WBC: 8 10*3/uL (ref 4.0–10.5)

## 2012-12-23 LAB — COMPREHENSIVE METABOLIC PANEL
ALT: 11 U/L (ref 0–35)
AST: 20 U/L (ref 0–37)
Albumin: 3 g/dL — ABNORMAL LOW (ref 3.5–5.2)
Alkaline Phosphatase: 50 U/L (ref 39–117)
BUN: 23 mg/dL (ref 6–23)
CO2: 28 mEq/L (ref 19–32)
Calcium: 8.8 mg/dL (ref 8.4–10.5)
Chloride: 98 mEq/L (ref 96–112)
Creatinine, Ser: 0.93 mg/dL (ref 0.50–1.10)
GFR calc Af Amer: 66 mL/min — ABNORMAL LOW (ref >90)
GFR calc non Af Amer: 57 mL/min — ABNORMAL LOW (ref >90)
Glucose, Bld: 102 mg/dL — ABNORMAL HIGH (ref 70–99)
Potassium: 2.9 mEq/L — ABNORMAL LOW (ref 3.5–5.1)
Sodium: 137 mEq/L (ref 135–145)
Total Bilirubin: 1.1 mg/dL (ref 0.3–1.2)
Total Protein: 6.1 g/dL (ref 6.0–8.3)

## 2012-12-23 LAB — PROTIME-INR
INR: 1.14 (ref 0.00–1.49)
Prothrombin Time: 14.4 seconds (ref 11.6–15.2)

## 2012-12-23 LAB — URINE MICROSCOPIC-ADD ON

## 2012-12-23 LAB — PRO B NATRIURETIC PEPTIDE: Pro B Natriuretic peptide (BNP): 586.7 pg/mL — ABNORMAL HIGH (ref 0–450)

## 2012-12-23 MED ORDER — POTASSIUM CHLORIDE CRYS ER 20 MEQ PO TBCR
40.0000 meq | EXTENDED_RELEASE_TABLET | Freq: Once | ORAL | Status: AC
  Administered 2012-12-23: 40 meq via ORAL
  Filled 2012-12-23: qty 2

## 2012-12-23 MED ORDER — VANCOMYCIN HCL IN DEXTROSE 1-5 GM/200ML-% IV SOLN
1000.0000 mg | Freq: Once | INTRAVENOUS | Status: AC
  Administered 2012-12-23: 1000 mg via INTRAVENOUS
  Filled 2012-12-23: qty 200

## 2012-12-23 MED ORDER — SODIUM CHLORIDE 0.9 % IV SOLN
INTRAVENOUS | Status: DC
  Administered 2012-12-23 – 2012-12-25 (×4): via INTRAVENOUS

## 2012-12-23 MED ORDER — PREDNISONE 5 MG PO TABS
5.0000 mg | ORAL_TABLET | Freq: Every day | ORAL | Status: DC
  Administered 2012-12-24 – 2012-12-28 (×5): 5 mg via ORAL
  Filled 2012-12-23 (×5): qty 1

## 2012-12-23 MED ORDER — TRAMADOL HCL 50 MG PO TABS
50.0000 mg | ORAL_TABLET | Freq: Once | ORAL | Status: AC
  Administered 2012-12-23: 50 mg via ORAL
  Filled 2012-12-23: qty 1

## 2012-12-23 MED ORDER — VANCOMYCIN HCL IN DEXTROSE 1-5 GM/200ML-% IV SOLN
1000.0000 mg | Freq: Every day | INTRAVENOUS | Status: DC
  Administered 2012-12-24: 1000 mg via INTRAVENOUS
  Filled 2012-12-23: qty 200

## 2012-12-23 MED ORDER — OXYCODONE HCL 5 MG PO TABS
5.0000 mg | ORAL_TABLET | ORAL | Status: DC | PRN
  Administered 2012-12-25 – 2012-12-28 (×3): 5 mg via ORAL
  Filled 2012-12-23 (×4): qty 1

## 2012-12-23 MED ORDER — VANCOMYCIN HCL IN DEXTROSE 1-5 GM/200ML-% IV SOLN: 1000.0000 mg | Freq: Every day | INTRAVENOUS | Status: DC

## 2012-12-23 MED ORDER — SODIUM CHLORIDE 0.9 % IV SOLN
INTRAVENOUS | Status: AC
  Administered 2012-12-23: 15:00:00 via INTRAVENOUS

## 2012-12-23 MED ORDER — ONDANSETRON HCL 4 MG/2ML IJ SOLN
4.0000 mg | Freq: Once | INTRAMUSCULAR | Status: AC
  Administered 2012-12-23: 4 mg via INTRAVENOUS
  Filled 2012-12-23: qty 2

## 2012-12-23 MED ORDER — METHOTREXATE 2.5 MG PO TABS
17.5000 mg | ORAL_TABLET | ORAL | Status: DC
  Filled 2012-12-23: qty 7

## 2012-12-23 MED ORDER — SODIUM CHLORIDE 0.9 % IV BOLUS (SEPSIS)
250.0000 mL | Freq: Once | INTRAVENOUS | Status: AC
  Administered 2012-12-23: 250 mL via INTRAVENOUS

## 2012-12-23 NOTE — ED Notes (Signed)
Robinette Haines (daughter) - [phone]  Pauline Aus [phone]

## 2012-12-23 NOTE — ED Notes (Signed)
Pt. Stated, I've been having a leg infection on the left lower leg for several weeks. I went to Dr. On thrusday and she gave me an antibiotic but its not helping.

## 2012-12-23 NOTE — H&P (Signed)
[facility] History and Physical  [name] ZOX:[id number] DOB: 1929/12/05 DOA: 12/23/2012  Referring physician: Deretha Emory, EDP PCP: Lupita Raider, MD  Specialists: None  Chief Complaint: Cellulitis failed OP Rx   HPI: [name] is a 79 y.o. ?, known h/o Lung Ca diag 2013 2.7 cm clinical stage IA squamous cell carcinoma of the right upper lung S/p Curative SBRT 05/02/2012, 05/04/2012, 05/09/2012 to 54 Gy in 3 fractions (She has left lower lung nodule also, which was negative on PET and has not been biopsied), COPD, Severe Rh arthritis on MTx [had apparently finger surgery in the past], who presented to her primary care physician's office on 9 4 2014-she saw the practitioner there at that time for swelling of the left lower extremity with redness localized to the top of the ankle and was prescribed 100 mg twice a day of doxycycline. She states that since the redness spread over the course of today, she wanted to come to the emergency-she has an antecedent history about 2-3 weeks back of taking tramadol and then having an episode where she had vomiting. Since then and since actually 12/18/12 she states that she is been coughing small amounts of what she describes as blood. She describes the amount as being less than a quarter size sometimes tinged but sometimes she is seen "chunks" She states that she's also had pain in her mouth in the front of the mouth at the upper or and lower incisors.    Review of Systems: The patient denies fever, exposure to anyone ill, blurred vision, weakness on any one side of the body, falls, diarrhea, current nausea vomiting, emesis,  Past Medical History  Diagnosis Date  . Hip pain, right   . Osteoporosis   . RA (rheumatoid arthritis)   . PAC (premature atrial contraction)   . Shingles   . HLD (hyperlipidemia)   . Hernia, umbilical   . COPD (chronic obstructive pulmonary disease)   . Lung mass   . History of radiation therapy  05/02/12-05/04/12,&05/09/12    rul lung 54Gy/48fx  . Cancer     lung ca   History reviewed. No pertinent past surgical history. Social History:  reports that she quit smoking about 2 years ago. Her smoking use included Cigarettes. She smoked 0.00 packs per day. She has never used smokeless tobacco. She reports that she does not drink alcohol or use illicit drugs. Patient lives at an assisted living facility She has friends in the area She has 3 children who live in town   she used to smoke but quit a long time ago about 30 years ago She is not a current drinker She is originally from [place]   Allergies  Allergen Reactions  . Latex Other (See Comments)    Blisters on her mouth  . Boniva [Ibandronic Acid]     Muscle aches  . Darvocet [Propoxyphene-Acetaminophen] Nausea And Vomiting  . Erythromycin Nausea And Vomiting  . Morphine And Related Nausea And Vomiting  . Oxycontin [Oxycodone Hcl Er] Nausea And Vomiting  . Sulfa Antibiotics Nausea And Vomiting  . Tramadol Nausea And Vomiting  . Vicodin [Hydrocodone-Acetaminophen]     dizzy    No family history on file. cancer ran in the family    Prior to Admission medications   Medication Sig Start Date End Date Taking? Authorizing Provider  alendronate (FOSAMAX) 70 MG tablet Take 70 mg by mouth See admin instructions. On Sundays; Take with a full glass of water on an empty stomach.   Yes  Historical Provider, MD  Calcium Carbonate 1500 MG TABS Take 1,500 mg by mouth daily.   Yes Historical Provider, MD  doxycycline (VIBRA-TABS) 100 MG tablet Take 100 mg by mouth 2 (two) times daily.   Yes Historical Provider, MD  furosemide (LASIX) 20 MG tablet Take 20 mg by mouth daily as needed for fluid or edema.  10/16/12  Yes Historical Provider, MD  methotrexate 2.5 MG tablet Take 17.5 mg by mouth once a week. Takes on Sunday; Takes 6 tablets total  (17.5mg  ) every Sunday   Yes Historical Provider, MD  Multiple Vitamin (MULTIVITAMIN WITH MINERALS)  TABS Take 1 tablet by mouth daily.   Yes Historical Provider, MD  predniSONE (DELTASONE) 1 MG tablet Take 5 mg by mouth daily.    Yes Historical Provider, MD   Physical Exam: Filed Vitals:   12/23/12 1415  BP: 115/86  Pulse: 76  Temp:   Resp: 18     General:  Alert frail Caucasian female no apparent distress   Eyes: EOMI NCAT not able to visualize fundus   ENT: Poor dentition, cavity noted at #3   Neck: Soft supple no JVD   Cardiovascular: S1-S2 , PACs and slight sinus arrhythmia   Respiratory: Clinically clear no added sound   Abdomen: Soft nontender nondistended no rebound   Skin: Lower extremity erythema on left side which is just below the left knee   Musculoskeletal: Range of motion intact   Psychiatric: Euthymic   Neurologic: Within normal limits   Labs on Admission:  Basic Metabolic Panel:  Recent Labs Lab 12/23/12 1244  NA 137  K 2.9*  CL 98  CO2 28  GLUCOSE 102*  BUN 23  CREATININE 0.93  CALCIUM 8.8   Liver Function Tests:  Recent Labs Lab 12/23/12 1244  AST 20  ALT 11  ALKPHOS 50  BILITOT 1.1  PROT 6.1  ALBUMIN 3.0*   No results found for this basename: LIPASE, AMYLASE,  in the last 168 hours No results found for this basename: AMMONIA,  in the last 168 hours CBC:  Recent Labs Lab 12/23/12 1244  WBC 8.0  NEUTROABS 7.5  HGB 10.2*  HCT 29.6*  MCV 99.0  PLT 37*   Cardiac Enzymes: No results found for this basename: CKTOTAL, CKMB, CKMBINDEX, TROPONINI,  in the last 168 hours  BNP (last 3 results)  Recent Labs  12/23/12 1244  PROBNP 586.7*   CBG: No results found for this basename: GLUCAP,  in the last 168 hours  Radiological Exams on Admission: Dg Chest 2 View  12/23/2012   *RADIOLOGY REPORT*  Clinical Data: Leg pain.  Weakness.  CHEST - 2 VIEW  Comparison: 04/13/2012  Findings: Hyperinflation/COPD.  Osteopenia.  Convex left lumbar spine curvature which is moderate to marked.  Normal heart size with aortic  atherosclerosis. Midline trachea.  No pleural effusion or pneumothorax.  Grossly similar appearance of right upper lobe scarring and postsurgical changes.  Nodular density projecting over the left lung base laterally likely corresponds to a area of pleural-based nodularity on image 42 of the prior CT.  No new consolidation.  IMPRESSION: Hyperinflation/COPD with grossly similar right upper lobe scarring.  No acute superimposed process.   Original Report Authenticated By: Jeronimo Greaves, M.D.    EKG: Independently reviewed. Sinus rhythm PR interval 0.20 QRS axis 10 , no acute ST-T wave changes in precordial leads , sinus arrhythmia noted with occasional PAC   Assessment/Plan Principal Problem:   Cellulitis-failed outpatient treatment-continue vancomycin 1 g every  24 hourly. Monitor closely. Leg to be marked out by nursing for comparison in the morning. Active Problems:   Hemoptysis? Secondary to either irritation by dentures, vs. secondary to lung neoplasm-discussed the same dr. Michell Heinrich of radiation oncology. Highly unlikely that this is secondary to radiation induced lung damage as this was very low questioning and per her report-note that patient is also on methotrexate that can cause lung injury as well. We will monitor her and if her hemoglobin drops or if we have further evidence that this indeed is blood, then we will probably need to have her reassessed by radiation oncology   Rheumatoid arthritis-severe-she will need to continue her regular scheduled methotrexate, prednisone 17.5 mg and 5 mg respectively. See above discussion   COPD, mild-stable at present   Osteoporosis-continue alendronate for now   Hypokalemia-Replacing po 40 MEQ daily.  Rpt lab am   H/o Lung Ca s/p SBRT-had hemoptysis-see above and below.   Thrombocytopenia-this is relatively new finding. We will get a CBC and ensure that this is correct.  If indeed this is real, will need a platelet smear, and potentially a discussion with  Medical oncology, who will need to be consulted, given this could be related to her primary lung process  Spoke on telephone with Dr. Michell Heinrich as above  Full Code    Mahala Menghini [facility] Pager [phone]  If 7PM-7AM, please contact night-coverage [email or web address] Password [email or web address] 12/23/2012, 2:58 PM

## 2012-12-23 NOTE — ED Provider Notes (Addendum)
CSN: [id number]     Arrival date & time 12/23/12  1125 History   First MD Initiated Contact with Patient 12/23/12 1151     Chief Complaint  Patient presents with  . Leg Pain   (Consider location/radiation/quality/duration/timing/severity/associated sxs/prior Treatment) The history is provided by the patient and a friend.   79 year old female primary care doctors Cam Hai, patient was started on doxycycline for a left leg cellulitis 2-1/2 days ago. It is not improving patient states it's gotten worse and is spreading. Patient also with complaint of pain in that leg rated as an 8/10. No injury or fall. The patient also complains of sores to her mouth and lower lip area. That has been present for several weeks. Patient denies any fevers. Leg pain is described as an ache. Made worse by walking.  Past Medical History  Diagnosis Date  . Hip pain, right   . Osteoporosis   . RA (rheumatoid arthritis)   . PAC (premature atrial contraction)   . Shingles   . HLD (hyperlipidemia)   . Hernia, umbilical   . COPD (chronic obstructive pulmonary disease)   . Lung mass   . History of radiation therapy 05/02/12-05/04/12,&05/09/12    rul lung 54Gy/51fx  . Cancer     lung ca   History reviewed. No pertinent past surgical history. No family history on file. History  Substance Use Topics  . Smoking status: Former Smoker    Types: Cigarettes    Quit date: 01/11/2010  . Smokeless tobacco: Never Used  . Alcohol Use: No   OB History   Grav Para Term Preterm Abortions TAB SAB Ect Mult Living                 Review of Systems  Constitutional: Positive for fatigue. Negative for fever.  HENT: Positive for mouth sores.   Eyes: Negative for redness.  Respiratory: Negative for shortness of breath.   Cardiovascular: Negative for chest pain.  Gastrointestinal: Negative for abdominal pain.  Genitourinary: Negative for dysuria.  Musculoskeletal: Negative for back pain.  Skin: Positive for rash.   Neurological: Negative for headaches.  Hematological: Does not bruise/bleed easily.  Psychiatric/Behavioral: Negative for confusion.    Allergies  Latex; Boniva; Darvocet; Erythromycin; Morphine and related; Oxycontin; Sulfa antibiotics; Tramadol; and Vicodin  Home Medications   Current Outpatient Rx  Name  Route  Sig  Dispense  Refill  . alendronate (FOSAMAX) 70 MG tablet   Oral   Take 70 mg by mouth See admin instructions. On Sundays; Take with a full glass of water on an empty stomach.         . Calcium Carbonate 1500 MG TABS   Oral   Take 1,500 mg by mouth daily.         Marland Kitchen doxycycline (VIBRA-TABS) 100 MG tablet   Oral   Take 100 mg by mouth 2 (two) times daily.         . furosemide (LASIX) 20 MG tablet   Oral   Take 20 mg by mouth daily as needed for fluid or edema.          . methotrexate 2.5 MG tablet   Oral   Take 17.5 mg by mouth once a week. Takes on Sunday; Takes 6 tablets total  (17.5mg  ) every Sunday         . Multiple Vitamin (MULTIVITAMIN WITH MINERALS) TABS   Oral   Take 1 tablet by mouth daily.         Marland Kitchen  predniSONE (DELTASONE) 1 MG tablet   Oral   Take 5 mg by mouth daily.           BP 115/86  Pulse 76  Temp(Src) 98.4 F (36.9 C) (Oral)  Resp 18  SpO2 93% Physical Exam  Nursing note and vitals reviewed. Constitutional: She is oriented to person, place, and time. She appears well-developed and well-nourished. No distress.  HENT:  Head: Normocephalic and atraumatic.  Mouth/Throat: Oropharynx is clear and moist.  Erythema some lesions noted to the inner lower lip and some erythema to the gums.  Eyes: Conjunctivae are normal. Pupils are equal, round, and reactive to light.  Neck: Normal range of motion. Neck supple.  Cardiovascular: Normal rate, regular rhythm and normal heart sounds.   Pulmonary/Chest: Effort normal and breath sounds normal. No respiratory distress.  Abdominal: Soft. Bowel sounds are normal. There is no  tenderness.  Neurological: She is alert and oriented to person, place, and time. She has normal reflexes. No cranial nerve deficit. She exhibits normal muscle tone. Coordination normal.  Skin: Skin is warm. There is erythema.  Right leg with large area of erythema increased warmth consistent with cellulitis. It involves the foot ankle and up to the knee. Mostly anterior. Normal cap refill to the big toes to the right foot.    ED Course  Procedures (including critical care time) Labs Review Labs Reviewed  PRO B NATRIURETIC PEPTIDE - Abnormal; Notable for the following:    Pro B Natriuretic peptide (BNP) 586.7 (*)    All other components within normal limits  COMPREHENSIVE METABOLIC PANEL - Abnormal; Notable for the following:    Potassium 2.9 (*)    Glucose, Bld 102 (*)    Albumin 3.0 (*)    GFR calc non Af Amer 57 (*)    GFR calc Af Amer 66 (*)    All other components within normal limits  CBC WITH DIFFERENTIAL - Abnormal; Notable for the following:    RBC 2.99 (*)    Hemoglobin 10.2 (*)    HCT 29.6 (*)    MCH 34.1 (*)    Platelets 37 (*)    Neutrophils Relative % 93 (*)    Lymphocytes Relative 6 (*)    Lymphs Abs 0.5 (*)    Monocytes Relative 0 (*)    Monocytes Absolute 0.0 (*)    All other components within normal limits  PROTIME-INR  URINALYSIS, ROUTINE W REFLEX MICROSCOPIC   Results for orders placed during the hospital encounter of 12/23/12  PRO B NATRIURETIC PEPTIDE      Result Value Range   Pro B Natriuretic peptide (BNP) 586.7 (*) 0 - 450 pg/mL  PROTIME-INR      Result Value Range   Prothrombin Time 14.4  11.6 - 15.2 seconds   INR 1.14  0.00 - 1.49  COMPREHENSIVE METABOLIC PANEL      Result Value Range   Sodium 137  135 - 145 mEq/L   Potassium 2.9 (*) 3.5 - 5.1 mEq/L   Chloride 98  96 - 112 mEq/L   CO2 28  19 - 32 mEq/L   Glucose, Bld 102 (*) 70 - 99 mg/dL   BUN 23  6 - 23 mg/dL   Creatinine, Ser 0.25  0.50 - 1.10 mg/dL   Calcium 8.8  8.4 - 42.7 mg/dL    Total Protein 6.1  6.0 - 8.3 g/dL   Albumin 3.0 (*) 3.5 - 5.2 g/dL   AST 20  0 - 37 U/L  ALT 11  0 - 35 U/L   Alkaline Phosphatase 50  39 - 117 U/L   Total Bilirubin 1.1  0.3 - 1.2 mg/dL   GFR calc non Af Amer 57 (*) >90 mL/min   GFR calc Af Amer 66 (*) >90 mL/min  CBC WITH DIFFERENTIAL      Result Value Range   WBC 8.0  4.0 - 10.5 K/uL   RBC 2.99 (*) 3.87 - 5.11 MIL/uL   Hemoglobin 10.2 (*) 12.0 - 15.0 g/dL   HCT 16.1 (*) 09.6 - 04.5 %   MCV 99.0  78.0 - 100.0 fL   MCH 34.1 (*) 26.0 - 34.0 pg   MCHC 34.5  30.0 - 36.0 g/dL   RDW 40.9  81.1 - 91.4 %   Platelets 37 (*) 150 - 400 K/uL   Neutrophils Relative % 93 (*) 43 - 77 %   Neutro Abs 7.5  1.7 - 7.7 K/uL   Lymphocytes Relative 6 (*) 12 - 46 %   Lymphs Abs 0.5 (*) 0.7 - 4.0 K/uL   Monocytes Relative 0 (*) 3 - 12 %   Monocytes Absolute 0.0 (*) 0.1 - 1.0 K/uL   Eosinophils Relative 1  0 - 5 %   Eosinophils Absolute 0.0  0.0 - 0.7 K/uL   Basophils Relative 0  0 - 1 %   Basophils Absolute 0.0  0.0 - 0.1 K/uL    Chest x-ray reviewed by me. No acute evidence of infiltrate pneumonia or pneumothorax. No evidence of pulmonary edema. Formal results from radiology are still pending.     Date: 12/23/2012  Rate: 78  Rhythm: normal sinus rhythm and premature atrial contractions (PAC)  QRS Axis: normal  Intervals: normal  ST/T Wave abnormalities: nonspecific ST/T changes  Conduction Disutrbances:none  Narrative Interpretation:   Old EKG Reviewed: none available   Imaging Review Dg Chest 2 View  12/23/2012   *RADIOLOGY REPORT*  Clinical Data: Leg pain.  Weakness.  CHEST - 2 VIEW  Comparison: 04/13/2012  Findings: Hyperinflation/COPD.  Osteopenia.  Convex left lumbar spine curvature which is moderate to marked.  Normal heart size with aortic atherosclerosis. Midline trachea.  No pleural effusion or pneumothorax.  Grossly similar appearance of right upper lobe scarring and postsurgical changes.  Nodular density projecting over the left  lung base laterally likely corresponds to a area of pleural-based nodularity on image 42 of the prior CT.  No new consolidation.  IMPRESSION: Hyperinflation/COPD with grossly similar right upper lobe scarring.  No acute superimposed process.   Original Report Authenticated By: Jeronimo Greaves, M.D.    MDM   1. Cellulitis    Patient was significant cellulitis to her left leg. Shortly been on doxycycline for 2 days. Primary care doctors Cam Hai with [facility] physicians. Patient will require admission first dose of IV vancomycin given in the emergency department. Discuss with [facility] hospitalist for admission.  Mild hypokalemia noted to 2.9 treated with oral potassium. Patient's renal function is normal. Chest x-ray reviewed by me no acute findings.    Shelda Jakes, MD 12/23/12 1454  Shelda Jakes, MD 12/23/12 [phone]

## 2012-12-24 DIAGNOSIS — M069 Rheumatoid arthritis, unspecified: Secondary | ICD-10-CM

## 2012-12-24 DIAGNOSIS — D696 Thrombocytopenia, unspecified: Secondary | ICD-10-CM | POA: Diagnosis present

## 2012-12-24 DIAGNOSIS — R041 Hemorrhage from throat: Secondary | ICD-10-CM | POA: Diagnosis present

## 2012-12-24 DIAGNOSIS — K120 Recurrent oral aphthae: Secondary | ICD-10-CM

## 2012-12-24 DIAGNOSIS — D638 Anemia in other chronic diseases classified elsewhere: Secondary | ICD-10-CM | POA: Diagnosis present

## 2012-12-24 DIAGNOSIS — J392 Other diseases of pharynx: Secondary | ICD-10-CM

## 2012-12-24 DIAGNOSIS — D649 Anemia, unspecified: Secondary | ICD-10-CM

## 2012-12-24 LAB — PROTIME-INR
INR: 1.17 (ref 0.00–1.49)
Prothrombin Time: 14.7 seconds (ref 11.6–15.2)

## 2012-12-24 LAB — COMPREHENSIVE METABOLIC PANEL
ALT: 9 U/L (ref 0–35)
AST: 18 U/L (ref 0–37)
Albumin: 2.4 g/dL — ABNORMAL LOW (ref 3.5–5.2)
Alkaline Phosphatase: 42 U/L (ref 39–117)
BUN: 17 mg/dL (ref 6–23)
CO2: 23 mEq/L (ref 19–32)
Calcium: 7.8 mg/dL — ABNORMAL LOW (ref 8.4–10.5)
Chloride: 101 mEq/L (ref 96–112)
Creatinine, Ser: 0.81 mg/dL (ref 0.50–1.10)
GFR calc Af Amer: 77 mL/min — ABNORMAL LOW (ref >90)
GFR calc non Af Amer: 67 mL/min — ABNORMAL LOW (ref >90)
Glucose, Bld: 94 mg/dL (ref 70–99)
Potassium: 3.6 mEq/L (ref 3.5–5.1)
Sodium: 134 mEq/L — ABNORMAL LOW (ref 135–145)
Total Bilirubin: 1 mg/dL (ref 0.3–1.2)
Total Protein: 5.2 g/dL — ABNORMAL LOW (ref 6.0–8.3)

## 2012-12-24 MED ORDER — METHOTREXATE 2.5 MG PO TABS: 17.5000 mg | ORAL_TABLET | ORAL | Status: DC

## 2012-12-24 MED ORDER — WHITE PETROLATUM GEL
Status: AC
  Administered 2012-12-24: 0.2
  Filled 2012-12-24: qty 5

## 2012-12-24 MED ORDER — VANCOMYCIN HCL 500 MG IV SOLR
500.0000 mg | INTRAVENOUS | Status: DC
  Administered 2012-12-25 – 2012-12-27 (×3): 500 mg via INTRAVENOUS
  Filled 2012-12-24 (×3): qty 500

## 2012-12-24 MED ORDER — METHOTREXATE 2.5 MG PO TABS: 15.0000 mg | ORAL_TABLET | ORAL | Status: DC

## 2012-12-24 MED ORDER — MAGIC MOUTHWASH W/LIDOCAINE
15.0000 mL | Freq: Four times a day (QID) | ORAL | Status: DC
  Administered 2012-12-24 (×3): 15 mL via ORAL
  Filled 2012-12-24 (×7): qty 15

## 2012-12-24 MED ORDER — ENSURE COMPLETE PO LIQD
237.0000 mL | Freq: Three times a day (TID) | ORAL | Status: DC
  Administered 2012-12-24 – 2012-12-28 (×11): 237 mL via ORAL

## 2012-12-24 MED ORDER — DOXYCYCLINE HYCLATE 100 MG PO TABS
100.0000 mg | ORAL_TABLET | Freq: Two times a day (BID) | ORAL | Status: DC
  Filled 2012-12-24: qty 1

## 2012-12-24 MED ORDER — HYDROCERIN EX CREA
TOPICAL_CREAM | Freq: Two times a day (BID) | CUTANEOUS | Status: DC
  Administered 2012-12-24 – 2012-12-28 (×8): via TOPICAL
  Filled 2012-12-24: qty 113

## 2012-12-24 NOTE — Progress Notes (Signed)
[facility] PROGRESS NOTE  [name] ZOX:[id number] DOB: 05-02-1929 DOA: 12/23/2012 PCP: Lupita Raider, MD  Assessment/Plan:    Cellulitis: failed outpatient doxycycline. Continue vancomycin. Currently looks more like petechiae than cellulitis, but it is warm.    Thrombocytopenia, unspecified: will check anemia panel. Suspect related to methotrexate. Will stop methotrexate and discussed with Dr. Nickola Major tomorrow    Anemia:  see above    Aphthous stomatitis:  will order Magic mouthwash with lidocaine as needed    Bleeding from nasopharynx:blood is visualized in the nasopharynx, not hemoptysis. Secondary to mucositis and Thrombocytopenia.    Malignant neoplasm of upper lobe, bronchus or lung    Rheumatoid arthritis-severe:Patient reports she has a difficult time walking on her own. She lives in independent living and would be interested in short-term skilled nursing facility if she qualified. Will order physical therapy occupational therapy    COPD, mild,Stable    Osteoporosis  Protein calorie malnutrition: Patient reports losing weight recently, particularly with her mouth ulcers. Will order ensure, advance diet to soft, get a dietitian consult  Code Status: Full Family Communication: daughter, Briscoe Deutscher Disposition Plan: possibly short-term skilled nursing facility  Consultants:  PT, OT, dietitian  Procedures:    Antibiotics:  Vancomycin 9/6  HPI/Subjective: Mouth hurts. No epistaxis. Still coughing up blood-tinged material. Tolerating clear liquids. Has been having difficulty walking. Usually walks without an assist device. Patient reports that she is been prescribed 8 methotrexate tablets, but only takes 6 weekly.  Objective: Filed Vitals:   12/24/12 0559  BP: 91/52  Pulse: 62  Temp: 99 F (37.2 C)  Resp: 18    Intake/Output Summary (Last 24 hours) at 12/24/12 1306 Last data filed at 12/24/12 0500  Gross per 24 hour  Intake   1845 ml  Output      0  ml  Net   1845 ml   Filed Weights   12/23/12 1700  Weight: 39.508 kg (87 lb 1.6 oz)    Exam:   General:  frail. Talkative. Occasionally tearful.  Cardiovascular: regular rate rhythm without murmurs gallops rubs  Respiratory: clear to auscultation bilaterally without wheezes rhonchi or rales  Abdomen: soft nontender nondistended  Musculoskeletal: left leg with petechiae, mild erythema, warmth edema and tenderness, seems to be receding a bit. Deformities of fingers hands and toes and feet consistent with rheumatoid arthritis.  Data Reviewed: Basic Metabolic Panel:  Recent Labs Lab 12/23/12 1244 12/24/12 0545  NA 137 134*  K 2.9* 3.6  CL 98 101  CO2 28 23  GLUCOSE 102* 94  BUN 23 17  CREATININE 0.93 0.81  CALCIUM 8.8 7.8*   Liver Function Tests:  Recent Labs Lab 12/23/12 1244 12/24/12 0545  AST 20 18  ALT 11 9  ALKPHOS 50 42  BILITOT 1.1 1.0  PROT 6.1 5.2*  ALBUMIN 3.0* 2.4*   No results found for this basename: LIPASE, AMYLASE,  in the last 168 hours No results found for this basename: AMMONIA,  in the last 168 hours CBC:  Recent Labs Lab 12/23/12 1244 12/23/12 1600  WBC 8.0 8.5  NEUTROABS 7.5  --   HGB 10.2* 9.9*  HCT 29.6* 28.4*  MCV 99.0 98.6  PLT 37* 33*   Cardiac Enzymes: No results found for this basename: CKTOTAL, CKMB, CKMBINDEX, TROPONINI,  in the last 168 hours BNP (last 3 results)  Recent Labs  12/23/12 1244  PROBNP 586.7*   CBG: No results found for this basename: GLUCAP,  in the last 168 hours  No results  found for this or any previous visit (from the past 240 hour(s)).   Studies: Dg Chest 2 View  12/23/2012   *RADIOLOGY REPORT*  Clinical Data: Leg pain.  Weakness.  CHEST - 2 VIEW  Comparison: 04/13/2012  Findings: Hyperinflation/COPD.  Osteopenia.  Convex left lumbar spine curvature which is moderate to marked.  Normal heart size with aortic atherosclerosis. Midline trachea.  No pleural effusion or pneumothorax.  Grossly  similar appearance of right upper lobe scarring and postsurgical changes.  Nodular density projecting over the left lung base laterally likely corresponds to a area of pleural-based nodularity on image 42 of the prior CT.  No new consolidation.  IMPRESSION: Hyperinflation/COPD with grossly similar right upper lobe scarring.  No acute superimposed process.   Original Report Authenticated By: Jeronimo Greaves, M.D.    Scheduled Meds: . predniSONE  5 mg Oral Daily   Continuous Infusions: . sodium chloride 100 mL/hr at 12/23/12 2217   Time spent:  45 min  Eufemio Strahm L  [facility] Pager [phone]. If 7PM-7AM, please contact night-coverage at [email or web address], password [facility] 12/24/2012, 1:06 PM  LOS: 1 day

## 2012-12-24 NOTE — Progress Notes (Signed)
ANTIBIOTIC CONSULT NOTE - INITIAL  Pharmacy Consult for vancomycin Indication: cellulitis  Allergies  Allergen Reactions  . Latex Other (See Comments)    Blisters on her mouth  . Boniva [Ibandronic Acid]     Muscle aches  . Darvocet [Propoxyphene-Acetaminophen] Nausea And Vomiting  . Erythromycin Nausea And Vomiting  . Morphine And Related Nausea And Vomiting  . Oxycontin [Oxycodone Hcl Er] Nausea And Vomiting  . Sulfa Antibiotics Nausea And Vomiting  . Tramadol Nausea And Vomiting  . Vicodin [Hydrocodone-Acetaminophen]     dizzy    Patient Measurements: Height: 4\' 11"  (149.9 cm) Weight: 87 lb 1.6 oz (39.508 kg) IBW/kg (Calculated) : 43.2   Vital Signs: Temp: 99.9 F (37.7 C) (09/07 1342) Temp src: Oral (09/07 1342) BP: 88/49 mmHg (09/07 1342) Pulse Rate: 93 (09/07 1342) Intake/Output from previous day: 09/06 0701 - 09/07 0700 In: 1845 [P.O.:480; I.V.:1365] Out: -  Intake/Output from this shift:    Labs:  Recent Labs  12/23/12 1244 12/23/12 1600 12/24/12 0545  WBC 8.0 8.5  --   HGB 10.2* 9.9*  --   PLT 37* 33*  --   CREATININE 0.93  --  0.81   Estimated Creatinine Clearance: 34.5 ml/min (by C-G formula based on Cr of 0.81). No results found for this basename: VANCOTROUGH, VANCOPEAK, VANCORANDOM, GENTTROUGH, GENTPEAK, GENTRANDOM, TOBRATROUGH, TOBRAPEAK, TOBRARND, AMIKACINPEAK, AMIKACINTROU, AMIKACIN,  in the last 72 hours   Microbiology: No results found for this or any previous visit (from the past 720 hour(s)).  Medical History: Past Medical History  Diagnosis Date  . Hip pain, right   . Osteoporosis   . RA (rheumatoid arthritis)   . PAC (premature atrial contraction)   . Shingles   . HLD (hyperlipidemia)   . Hernia, umbilical   . COPD (chronic obstructive pulmonary disease)   . Lung mass   . History of radiation therapy 05/02/12-05/04/12,&05/09/12    rul lung 54Gy/75fx  . Cancer     lung ca    Medications:  See med rec  Assessment: Patient  is an 79 y.o F on doxycycline PTA for LE cellulitis who presented to the ED on 9/6 with c/o worsening of infection.  To start vancomycin for gram (+)/MRSA coverage.  Patient received vancomycin 1gm in the ED at around 3 PM on 9/06.  Goal of Therapy:  Vancomycin trough = 10-15  Plan:  1) vancomycin 500mg  IV q24h (next dose due on 12/25/12 at 3AM)  Shahmeer Bunn P 12/24/2012,2:15 PM

## 2012-12-25 DIAGNOSIS — F339 Major depressive disorder, recurrent, unspecified: Secondary | ICD-10-CM | POA: Diagnosis present

## 2012-12-25 DIAGNOSIS — E43 Unspecified severe protein-calorie malnutrition: Secondary | ICD-10-CM | POA: Insufficient documentation

## 2012-12-25 DIAGNOSIS — F329 Major depressive disorder, single episode, unspecified: Secondary | ICD-10-CM

## 2012-12-25 DIAGNOSIS — F3289 Other specified depressive episodes: Secondary | ICD-10-CM

## 2012-12-25 DIAGNOSIS — D638 Anemia in other chronic diseases classified elsewhere: Secondary | ICD-10-CM

## 2012-12-25 LAB — FOLATE: Folate: 7 ng/mL

## 2012-12-25 LAB — IRON AND TIBC
Iron: 85 ug/dL (ref 42–135)
Saturation Ratios: 57 % — ABNORMAL HIGH (ref 20–55)
TIBC: 148 ug/dL — ABNORMAL LOW (ref 250–470)
UIBC: 63 ug/dL — ABNORMAL LOW (ref 125–400)

## 2012-12-25 LAB — CBC
HCT: 27.2 % — ABNORMAL LOW (ref 36.0–46.0)
Hemoglobin: 9.3 g/dL — ABNORMAL LOW (ref 12.0–15.0)
MCH: 33.9 pg (ref 26.0–34.0)
MCHC: 34.2 g/dL (ref 30.0–36.0)
MCV: 99.3 fL (ref 78.0–100.0)
Platelets: 25 10*3/uL — CL (ref 150–400)
RBC: 2.74 MIL/uL — ABNORMAL LOW (ref 3.87–5.11)
RDW: 13.2 % (ref 11.5–15.5)
WBC: 4.8 10*3/uL (ref 4.0–10.5)

## 2012-12-25 LAB — VITAMIN B12: Vitamin B-12: 845 pg/mL (ref 211–911)

## 2012-12-25 LAB — FERRITIN: Ferritin: 378 ng/mL — ABNORMAL HIGH (ref 10–291)

## 2012-12-25 MED ORDER — MIRTAZAPINE 7.5 MG PO TABS
7.5000 mg | ORAL_TABLET | Freq: Every day | ORAL | Status: DC
  Administered 2012-12-25: 7.5 mg via ORAL
  Filled 2012-12-25 (×2): qty 1

## 2012-12-25 MED ORDER — MAGNESIUM HYDROXIDE 400 MG/5ML PO SUSP
30.0000 mL | Freq: Once | ORAL | Status: AC
  Administered 2012-12-25: 30 mL via ORAL
  Filled 2012-12-25: qty 30

## 2012-12-25 MED ORDER — MAGIC MOUTHWASH
15.0000 mL | Freq: Four times a day (QID) | ORAL | Status: DC
  Administered 2012-12-25 – 2012-12-26 (×7): 15 mL via ORAL
  Filled 2012-12-25 (×17): qty 15

## 2012-12-25 MED ORDER — ADULT MULTIVITAMIN W/MINERALS CH
1.0000 | ORAL_TABLET | Freq: Every day | ORAL | Status: DC
  Filled 2012-12-25: qty 1

## 2012-12-25 NOTE — Evaluation (Signed)
Occupational Therapy Evaluation Patient Details Name: [name] MRN: [id number] DOB: 1930/03/31 Today's Date: 12/25/2012 Time: 8657-8469 OT Time Calculation (min): 38 min  OT Assessment / Plan / Recommendation History of present illness 79 y.o. 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   Clinical Impression   Pt presents w/ impaired ability to perform ADL's & self care. She also has h/o RA affecting bilateral UE's (Right>Left noted) and she lives alone. Pt should benefit from acute OT followed by SNF rehab secondary to deficits listed below (see OT problem list).     OT Assessment  Patient needs continued OT Services    Follow Up Recommendations  SNF    Barriers to Discharge Other (comment) (Lives alone)    Equipment Recommendations  None recommended by OT    Recommendations for Other Services    Frequency  Min 2X/week    Precautions / Restrictions Precautions Precautions: Fall Restrictions Weight Bearing Restrictions: No   Pertinent Vitals/Pain Pt reports 10/10 Left LE pain, RN made aware. Pt was repositioned after increased activity. She declined elevation of her LLE in the bed.    ADL  Eating/Feeding: Performed;Modified independent Where Assessed - Eating/Feeding: Bed level Grooming: Performed;Wash/dry hands;Wash/dry face;Brushing hair;Set up Upper Body Bathing: Simulated;Set up;Supervision/safety Where Assessed - Upper  Body Bathing: Unsupported sitting Lower Body Bathing: Simulated;Min guard;Set up Where Assessed - Lower Body Bathing: Supported sit to stand Upper Body Dressing: Performed;Set up;Supervision/safety Where Assessed - Upper Body Dressing: Unsupported sitting Lower Body Dressing: Simulated;Minimal assistance Where Assessed - Lower Body Dressing: Supported sit to stand Toilet Transfer: Performed;Min guard Statistician Method: Sit to Barista: Comfort height toilet;Grab bars (RW) Toileting - Architect and Hygiene: Performed;Minimal assistance Where Assessed - Engineer, mining and Hygiene: Standing;Sit to stand from 3-in-1 or toilet Tub/Shower Transfer Method: Not assessed Transfers/Ambulation Related to ADLs: Pt overall min guard assist for funcation mobility and transfers related to ADL's and self care tasks. Pt very anxious and benefits from verbal encouragement and step by step instruction. ADL Comments: Pt was educated in [place] of OT and performed toilet transfer as well as grooming sitting EOB. Overall Min-min guard assist. Pt very anxious, benefits from increaes time for tasks and verbal cues w/ step by step instruction.    OT Diagnosis: Generalized weakness;Acute pain;Other (comment) (H/O RA )  OT Problem List: Decreased activity tolerance;Decreased knowledge of use of DME or AE;Pain OT Treatment Interventions: Self-care/ADL training;Energy conservation;DME and/or AE instruction;Patient/family education;Therapeutic activities   OT Goals(Current goals can be found in the care plan section) Acute Rehab OT Goals Patient Stated Goal: Decrease pain, go home OT Goal Formulation: With patient Time For Goal Achievement: 01/08/13 Potential to Achieve Goals: Good  Visit Information  Last OT Received On: 12/25/12 Assistance Needed: +1 History of Present Illness: 79 y.o. female, known h/o Lung Ca diag 2013 2.7 cm clinical stage IA squamous cell  carcinoma of the right  upper lung S/p Curative SBRT 05/02/2012, 05/04/2012, 05/09/2012 to 54 Gy in 3 fractions (She has left lower lung nodule also, which was negative on PET and has not been biopsied), COPD, Severe Rh arthritis on MTx had apparently finger surgery in the past, who presented to her primary care physician's office on 9 4 2014-she saw the practitioner there at that time for swelling of the left lower extremity with redness localized to the top of the ankle and was prescribed 100 mg twice a day of doxycycline.       Prior Functioning     Home Living Family/patient expects to be discharged to:: Private residence Living Arrangements: Alone Type of Home: Apartment Home Access: Elevator Home Layout: One level Home Equipment: Environmental consultant - 4 wheels;Cane - single point;Bedside commode;Shower seat;Grab bars - toilet Prior Function Level of Independence: Independent Communication Communication: No difficulties Dominant Hand: Right    Vision/Perception Vision - History Patient Visual Report: No change from baseline   Cognition  Cognition Arousal/Alertness: Awake/alert Behavior During Therapy: WFL for tasks assessed/performed;Anxious    Extremity/Trunk Assessment Upper Extremity Assessment Upper Extremity Assessment: RUE deficits/detail;LUE deficits/detail RUE Deficits / Details: Impaired secondary to RA, Right hand > left with ulnar deviation noted especially RF/Small fingers. RUE Coordination: decreased fine motor;decreased gross motor LUE Deficits / Details: Left UE RA noted affecting shoulder flexion & coordination. Lower Extremity Assessment Lower Extremity Assessment: Generalized weakness    Mobility Bed Mobility Bed Mobility: Sit to Supine;Scooting to HOB Sit to Supine: 4: Min assist;HOB flat;With rail Scooting to [facility]: With rail;4: Min assist Details for Bed Mobility Assistance: Verbal cues for safe technique & step by step planning Transfers Transfers: Sit to Stand;Stand  to Sit Sit to Stand: 4: Min guard;With upper extremity assist;From bed;From toilet Stand to Sit: 4: Min guard;With upper extremity assist;To bed;To toilet Details for Transfer Assistance: verbal cues for safe technique, requires increased time to perform transfers as pt appears very anxious and likes to know plan and instructions prior to movement            End of Session OT - End of Session Equipment Utilized During Treatment: Gait belt;Rolling walker Activity Tolerance: Patient tolerated treatment well Patient left: in bed;with call bell/phone within reach;with bed alarm set Nurse Communication: Patient requests pain meds  GO     Alm Bustard 12/25/2012, 10:52 AM

## 2012-12-25 NOTE — Care Management Note (Signed)
    Page 1 of 1   12/28/2012     11:53:42 AM   CARE MANAGEMENT NOTE 12/28/2012  Patient:  [name], [name]   Account Number:  [id number]  Date Initiated:  12/25/2012  Documentation initiated by:  Letha Cape  Subjective/Objective Assessment:   dx cellulitis  admit- lives alone.     Action/Plan:   pt eval- recs snf   Anticipated DC Date:  12/28/2012   Anticipated DC Plan:  SKILLED NURSING FACILITY  In-house referral  Clinical Social Worker      DC Planning Services  CM consult      Choice offered to / List presented to:             Status of service:  Completed, signed off Medicare Important Message given?   (If response is "NO", the following Medicare IM given date fields will be blank) Date Medicare IM given:   Date Additional Medicare IM given:    Discharge Disposition:  SKILLED NURSING FACILITY  Per UR Regulation:  Reviewed for med. necessity/level of care/duration of stay  If discussed at Long Length of Stay Meetings, dates discussed:    Comments:  12/28/12 11:52 Letha Cape RN, BSN [phone] patient is for dc to snf today, CSW following.  12/25/12 15:05 Letha Cape RN, BSN [phone] patient lives alone, per physical therapy eval recs snf, CSW referral.

## 2012-12-25 NOTE — Progress Notes (Signed)
Patient in house at [place], [address], room 9. Spoke with patient's nurse this morning, Wilkie Aye. She reports the patient is stable, alert and oriented. However, she states that night shift noted a few episodes of confusion. She reports that the patient coughs frequently. She reports that each time she cough a "little something comes up and its blood tinged." Also, the nurse confirms that the scan hemoptysis is a result of mucositis and thrombocytopenia. Wilkie Aye reports the mucositis is being treated with magic mouthwash and lidocaine. Dr. Kathrynn Running informed of such findings.

## 2012-12-25 NOTE — Progress Notes (Signed)
INITIAL NUTRITION ASSESSMENT  DOCUMENTATION CODES Per approved criteria  -Severe malnutrition in the context of chronic illness -Underweight   INTERVENTION: 1. Continue Ensure Complete po TID, each supplement provides 350 kcal and 13 grams of protein.   2. Multivitamin daily   NUTRITION DIAGNOSIS: Inadequate oral intake related to decreased appetite as evidenced by weight loss.   Goal: Po intake to meet >/=90% estimated nutrition needs.   Monitor:  PO intake, weight trends, labs  Reason for Assessment: Consult  79 y.o. female  Admitting Dx: Cellulitis  ASSESSMENT: Pt with hx of lung CA, COPD, RA who presented to PCP office with swelling of LE. Redness and swelling increased in size and so presented to ED. Also states that she has been "coughing up blood".   RD consulted for unintentional weight loss. Weight hx shows 10 lb weight loss over the past 8 months, (10% body weight). Pt unable to state if she is eating less then usual. Does not know her usual weight. Des endorse muscle loss in upper extremities. Says she has ulcers in her mouth that are making it hard to eat.   Nutrition Focused Physical Exam:  Subcutaneous Fat:  Orbital Region: mild wasting  Upper Arm Region: severe wasting Thoracic and Lumbar Region: severe wasting  Muscle:  Temple Region: severe wasting Clavicle Bone Region: severe wasting Clavicle and Acromion Bone Region: severe wasting  Scapular Bone Region: severe wasting Dorsal Hand: severe wasting Patellar Region: severe wasting  Anterior Thigh Region: n/a Posterior Calf Region: n/a  Edema: n/a   Pt meets criteria for severe malnutrition in the context of chronic illness 2/2 severe muscle and fat wasting.   Height: Ht Readings from Last 1 Encounters:  12/23/12 4\' 11"  (1.499 m)    Weight: Wt Readings from Last 1 Encounters:  12/23/12 87 lb 1.6 oz (39.508 kg)    Ideal Body Weight: 98 lbs   % Ideal Body Weight: 89%  Wt Readings from  Last 10 Encounters:  12/23/12 87 lb 1.6 oz (39.508 kg)  11/02/12 91 lb 3.2 oz (41.368 kg)  07/20/12 92 lb 1.6 oz (41.776 kg)  06/08/12 93 lb 3.2 oz (42.275 kg)  05/04/12 93 lb 1.6 oz (42.23 kg)  04/20/12 97 lb (43.999 kg)  04/13/12 98 lb (44.453 kg)  04/04/12 98 lb (44.453 kg)  03/21/12 97 lb (43.999 kg)    Usual Body Weight: 97 lbs   % Usual Body Weight: 89%  BMI:  Body mass index is 17.58 kg/(m^2). underweight   Estimated Nutritional Needs: Kcal: 1400-1600 Protein: 50-60 gm  Fluid: 1.4-1.6 L   Skin: cellulitis on L LE  Diet Order: Dysphagia 3, thin   EDUCATION NEEDS: -No education needs identified at this time   Intake/Output Summary (Last 24 hours) at 12/25/12 1113 Last data filed at 12/25/12 0619  Gross per 24 hour  Intake 2631.67 ml  Output      0 ml  Net 2631.67 ml    Last BM: PTA, reporting constipation    Labs:   Recent Labs Lab 12/23/12 1244 12/24/12 0545  NA 137 134*  K 2.9* 3.6  CL 98 101  CO2 28 23  BUN 23 17  CREATININE 0.93 0.81  CALCIUM 8.8 7.8*  GLUCOSE 102* 94    CBG (last 3)  No results found for this basename: GLUCAP,  in the last 72 hours  Scheduled Meds: . feeding supplement  237 mL Oral TID BM  . hydrocerin   Topical BID  . magic mouthwash  w/lidocaine  15 mL Oral QID  . predniSONE  5 mg Oral Daily  . vancomycin  500 mg Intravenous Q24H    Continuous Infusions: . sodium chloride 100 mL/hr at 12/25/12 1037    Past Medical History  Diagnosis Date  . Hip pain, right   . Osteoporosis   . RA (rheumatoid arthritis)   . PAC (premature atrial contraction)   . Shingles   . HLD (hyperlipidemia)   . Hernia, umbilical   . COPD (chronic obstructive pulmonary disease)   . Lung mass   . History of radiation therapy 05/02/12-05/04/12,&05/09/12    rul lung 54Gy/42fx  . Cancer     lung ca    History reviewed. No pertinent past surgical history.  Clarene Duke RD, LDN Pager [phone] After Hours pager [phone]

## 2012-12-25 NOTE — Progress Notes (Signed)
[facility] PROGRESS NOTE  [name] RUE:[id number] DOB: 1929/10/02 DOA: 12/23/2012 PCP: Lupita Raider, MD  Assessment/Plan:    Cellulitis: failed outpatient doxycycline. Continue vancomycin. Improving daily.    Thrombocytopenia, unspecified: likely secondary to MTX, which has been stopped.  Discussed with Dr. Nickola Major who agrees.  Has f/u appointment on 9/22.  Continue same pred dose    Anemia:  Workup c/w CD    Aphthous stomatitis:  Lidocaine makes pain worse.  Change to magic mouthwash without    Bleeding from nasopharynx:blood is visualized in the nasopharynx, not hemoptysis. Secondary to mucositis and Thrombocytopenia.  Less today    Malignant neoplasm of upper lobe, bronchus or lung    Rheumatoid arthritis-severe:Patient reports she has a difficult time walking on her own. She lives in independent living and would be interested in short-term skilled nursing facility if she qualified. Will order physical therapy occupational therapy    COPD, mild,Stable    Osteoporosis  Protein calorie malnutrition, severe.  Depression:  Start remeron  Code Status: Full Family Communication: daughter, cindy Disposition Plan: possibly short-term skilled nursing facility  Consultants:  PT, OT, dietitian  Procedures:    Antibiotics:  Vancomycin 9/6  HPI/Subjective: Mouth hurts.  Has been depressed for 2 years. Crying spells.  Can't sleep.  Feels hopeless.  No enjoyment.  Interested in antidepressant  Objective: Filed Vitals:   12/25/12 0616  BP: 129/51  Pulse: 65  Temp: 98.7 F (37.1 C)  Resp: 18    Intake/Output Summary (Last 24 hours) at 12/25/12 1249 Last data filed at 12/25/12 1000  Gross per 24 hour  Intake 2871.67 ml  Output      0 ml  Net 2871.67 ml   Filed Weights   12/23/12 1700  Weight: 39.508 kg (87 lb 1.6 oz)    Exam:   General:  frail. Talkative. Occasionally tearful.  Cardiovascular: regular rate rhythm without murmurs gallops  rubs  Respiratory: clear to auscultation bilaterally without wheezes rhonchi or rales  Abdomen: soft nontender nondistended  Musculoskeletal: cellulitis improving  Data Reviewed: Basic Metabolic Panel:  Recent Labs Lab 12/23/12 1244 12/24/12 0545  NA 137 134*  K 2.9* 3.6  CL 98 101  CO2 28 23  GLUCOSE 102* 94  BUN 23 17  CREATININE 0.93 0.81  CALCIUM 8.8 7.8*   Liver Function Tests:  Recent Labs Lab 12/23/12 1244 12/24/12 0545  AST 20 18  ALT 11 9  ALKPHOS 50 42  BILITOT 1.1 1.0  PROT 6.1 5.2*  ALBUMIN 3.0* 2.4*   No results found for this basename: LIPASE, AMYLASE,  in the last 168 hours No results found for this basename: AMMONIA,  in the last 168 hours CBC:  Recent Labs Lab 12/23/12 1244 12/23/12 1600 12/25/12 0955  WBC 8.0 8.5 4.8  NEUTROABS 7.5  --   --   HGB 10.2* 9.9* 9.3*  HCT 29.6* 28.4* 27.2*  MCV 99.0 98.6 99.3  PLT 37* 33* 25*   Cardiac Enzymes: No results found for this basename: CKTOTAL, CKMB, CKMBINDEX, TROPONINI,  in the last 168 hours BNP (last 3 results)  Recent Labs  12/23/12 1244  PROBNP 586.7*   CBG: No results found for this basename: GLUCAP,  in the last 168 hours  No results found for this or any previous visit (from the past 240 hour(s)).   Studies: Dg Chest 2 View  12/23/2012   *RADIOLOGY REPORT*  Clinical Data: Leg pain.  Weakness.  CHEST - 2 VIEW  Comparison: 04/13/2012  Findings: Hyperinflation/COPD.  Osteopenia.  Convex left lumbar spine curvature which is moderate to marked.  Normal heart size with aortic atherosclerosis. Midline trachea.  No pleural effusion or pneumothorax.  Grossly similar appearance of right upper lobe scarring and postsurgical changes.  Nodular density projecting over the left lung base laterally likely corresponds to a area of pleural-based nodularity on image 42 of the prior CT.  No new consolidation.  IMPRESSION: Hyperinflation/COPD with grossly similar right upper lobe scarring.  No acute  superimposed process.   Original Report Authenticated By: Jeronimo Greaves, M.D.    Scheduled Meds: . feeding supplement  237 mL Oral TID BM  . hydrocerin   Topical BID  . magic mouthwash  15 mL Oral QID  . multivitamin with minerals  1 tablet Oral Daily  . predniSONE  5 mg Oral Daily  . vancomycin  500 mg Intravenous Q24H   Continuous Infusions: . sodium chloride 100 mL/hr at 12/25/12 1100   Time spent:  45 min  Bryston Colocho L  [facility] Pager [phone]. If 7PM-7AM, please contact night-coverage at [email or web address], password [facility] 12/25/2012, 12:49 PM  LOS: 2 days

## 2012-12-25 NOTE — Evaluation (Signed)
Physical Therapy Evaluation Patient Details Name: [name] MRN: [id number] DOB: 1929-09-11 Today's Date: 12/25/2012 Time: 4098-1191 PT Time Calculation (min): 26 min  PT Assessment / Plan / Recommendation History of Present Illness  79 y.o. female, known h/o Lung Ca diag 2013 2.7 cm clinical stage Emily squamous cell carcinoma of the right upper lung S/p Curative SBRT 05/02/2012, 05/04/2012, 05/09/2012 to 54 Gy in 3 fractions (She has left lower lung nodule also, which was negative on PET and has not been biopsied), COPD, Severe Rh arthritis on MTx had apparently finger surgery in the past, who presented to her primary care physician's office on 9 4 2014-she saw the practitioner there at that time for swelling of the left lower extremity with redness localized to the top of the ankle and was prescribed 100 mg twice a day of doxycycline.  Clinical Impression  Pt admitted with L LE cellulitis. Pt currently with functional limitations due to the deficits listed below (see PT Problem List).  Pt will benefit from skilled PT to increase their independence and safety with mobility to allow discharge to the venue listed below.  Pt feels she cannot manage at home alone at this time and agreeable to ST-SNF upon d/c.     PT Assessment  Patient needs continued PT services    Follow Up Recommendations  SNF    Does the patient have the potential to tolerate intense rehabilitation      Barriers to Discharge        Equipment Recommendations  None recommended by PT    Recommendations for Other Services     Frequency Min 3X/week    Precautions / Restrictions Precautions Precautions: Fall   Pertinent Vitals/Pain 10/10 mouth and L LE pain, student RN to notify RN of pain      Mobility  Bed Mobility Bed Mobility: Not assessed Details for Bed Mobility Assistance: left with OT EOB Transfers Transfers: Sit to Stand;Stand to Sit Sit to Stand: 4: Min guard;With upper extremity assist;From bed;From  toilet Stand to Sit: 4: Min guard;With upper extremity assist;To bed;To toilet Details for Transfer Assistance: verbal cues for safe technique, requires increased time to perform transfers as pt appears very anxious and likes to know plan and instructions prior to movement Ambulation/Gait Ambulation/Gait Assistance: 4: Min guard Ambulation Distance (Feet): 40 Feet Assistive device: Rolling walker Ambulation/Gait Assistance Details: verbal cues for safe use of RW, pt reports L LE pain and encouraged to WB through RW to assist with pain control Gait Pattern: Step-through pattern;Decreased stride length Gait velocity: decreased    Exercises     PT Diagnosis: Difficulty walking;Acute pain  PT Problem List: Decreased strength;Decreased activity tolerance;Decreased mobility;Pain;Decreased knowledge of use of DME PT Treatment Interventions: DME instruction;Gait training;Functional mobility training;Patient/family education;Therapeutic activities;Therapeutic exercise     PT Goals(Current goals can be found in the care plan section) Acute Rehab PT Goals PT Goal Formulation: With patient Time For Goal Achievement: 01/08/13 Potential to Achieve Goals: Good  Visit Information  Last PT Received On: 12/25/12 Assistance Needed: +1 History of Present Illness: 79 y.o. 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       Prior Functioning  Home  Living Family/patient expects to be discharged to:: Private residence Living Arrangements: Alone Type of Home: Apartment Home Access: Elevator Home Layout: One level Home Equipment: Environmental consultant - 4 wheels;Cane - single point;Bedside commode;Shower seat;Grab bars - toilet Prior Function Level of Independence: Independent Communication Communication: No difficulties Dominant Hand: Right    Cognition       Extremity/Trunk Assessment Upper Extremity Assessment Upper Extremity Assessment: RUE deficits/detail;LUE deficits/detail RUE Deficits / Details: Impaired secondary to RA, Right hand > left with ulnar deviation noted especially RF/Small fingers. RUE Coordination: decreased fine motor;decreased gross motor LUE Deficits / Details: Left UE RA noted affecting shoulder flexion & coordination. Lower Extremity Assessment Lower Extremity Assessment: Generalized weakness   Balance    End of Session PT - End of Session Equipment Utilized During Treatment: Gait belt Activity Tolerance: Patient limited by pain;Patient limited by fatigue Patient left: in bed;with call bell/phone within reach;Other (comment) (with OT)  GP     Vasilisa Vore,KATHrine E 12/25/2012, 10:38 AM Zenovia Jarred, PT, DPT 12/25/2012 Pager: [phone]

## 2012-12-26 LAB — CBC
HCT: 24.4 % — ABNORMAL LOW (ref 36.0–46.0)
Hemoglobin: 8.5 g/dL — ABNORMAL LOW (ref 12.0–15.0)
MCH: 34.4 pg — ABNORMAL HIGH (ref 26.0–34.0)
MCHC: 34.8 g/dL (ref 30.0–36.0)
MCV: 98.8 fL (ref 78.0–100.0)
Platelets: 20 10*3/uL — CL (ref 150–400)
RBC: 2.47 MIL/uL — ABNORMAL LOW (ref 3.87–5.11)
RDW: 13.1 % (ref 11.5–15.5)
WBC: 4.3 10*3/uL (ref 4.0–10.5)

## 2012-12-26 LAB — ABO/RH: ABO/RH(D): O POS

## 2012-12-26 MED ORDER — MAGNESIUM HYDROXIDE 400 MG/5ML PO SUSP: 30.0000 mL | Freq: Every day | ORAL | Status: DC | PRN

## 2012-12-26 MED ORDER — MIRTAZAPINE 15 MG PO TABS
15.0000 mg | ORAL_TABLET | Freq: Every day | ORAL | Status: DC
  Administered 2012-12-26 – 2012-12-27 (×2): 15 mg via ORAL
  Filled 2012-12-26 (×3): qty 1

## 2012-12-26 NOTE — Progress Notes (Signed)
[facility] PROGRESS NOTE  [name] AVW:[id number] DOB: 1929-12-22 DOA: 12/23/2012 PCP: Lupita Raider, MD  Summary: 79 year old white female with rheumatoid arthritis on methotrexate, lives alone presented with left leg pain redness and swelling. Found to have left leg cellulitis, aphtous stomatitis and new thrombocytopenia and anemia. Dr. Janne Lab reports that her hemoglobin was about 12 a few months ago. Platelet count was within normal limits a few months ago as well. Also has been very depressed and losing weight. Started on Remeron during this hospitalization  Assessment/Plan:   Left leg Cellulitis: failed outpatient doxycycline. Continue vancomycin for another 24-48 hours. Improving daily.    Thrombocytopenia, unspecified: likely secondary to MTX, which has been stopped.  Discussed with Dr. Nickola Major who agrees.  Has f/u appointment on 9/22.  Continue same pred dose. Discussed with Dr. [name] today. He reports that methotrexate may cause a nadir in counts on day 7-10. She took methotrexate 6 days ago. Patient is having oozing from the nasopharynx, keeping her up at night. Dr. Truett Perna recommends a transfusion of one unit of platelets. If bleeding continues after transfusion, may take Amicar 1 g by mouth twice a day until results. Would keep patient in the hospital until platelet counts stopped dropping. Hematology will be available if patient continues to have problems, but likely this will just require time, sometimes several weeks.    Anemia:  Workup c/w CD, but likely methotrexate related as well. White blood cell count locally has remained stable    Aphthous stomatitis:  Secondary to methotrexate. Still with ulcerations, but magic mouthwash without lidocaine has been helping.    Bleeding from nasopharynx:  blood is visualized in the nasopharynx, NOT hemoptysis. Secondary to mucositis and Thrombocytopenia.     Malignant neoplasm of upper lobe, bronchus or lung, s/p XRT   Rheumatoid arthritis-severe, with deconditioning: 2 short-term skilled nursing facility when stable.    COPD, mild,Stable    Osteoporosis  Protein calorie malnutrition, severe.  Depression:  Did not sleep well last night. We will increase Remeron to 15 mg.  Code Status: Full Family Communication: daughter, cindy Disposition Plan: possibly short-term skilled nursing facility  Consultants:  PT, OT, dietitian  Procedures:    Antibiotics:  Vancomycin 9/6  HPI/Subjective: Couldn't sleep. Had blood-tinged secretions last night. Magic mouthwash seems to be helping her mouth pain. No epistaxis  Objective: Filed Vitals:   12/26/12 0603  BP: 147/52  Pulse: 72  Temp: 98 F (36.7 C)  Resp: 18    Intake/Output Summary (Last 24 hours) at 12/26/12 1345 Last data filed at 12/26/12 1000  Gross per 24 hour  Intake    970 ml  Output    200 ml  Net    770 ml   Filed Weights   12/23/12 1700  Weight: 39.508 kg (87 lb 1.6 oz)    Exam:   General:  frail. Talkative. Occasionally tearful.  HEENT: Moist mucous membranes. Aphthous ulcers continue to be noted on buccal mucosa and columns. Blood in the posterior oropharynx noted again today.  Cardiovascular: regular rate rhythm without murmurs gallops rubs  Respiratory: clear to auscultation bilaterally without wheezes rhonchi or rales  Abdomen: soft nontender nondistended  Musculoskeletal: cellulitis improving  Data Reviewed: Basic Metabolic Panel:  Recent Labs Lab 12/23/12 1244 12/24/12 0545  NA 137 134*  K 2.9* 3.6  CL 98 101  CO2 28 23  GLUCOSE 102* 94  BUN 23 17  CREATININE 0.93 0.81  CALCIUM 8.8 7.8*   Liver Function Tests:  Recent Labs  Lab 12/23/12 1244 12/24/12 0545  AST 20 18  ALT 11 9  ALKPHOS 50 42  BILITOT 1.1 1.0  PROT 6.1 5.2*  ALBUMIN 3.0* 2.4*   No results found for this basename: LIPASE, AMYLASE,  in the last 168 hours No results found for this basename: AMMONIA,  in the last 168  hours CBC:  Recent Labs Lab 12/23/12 1244 12/23/12 1600 12/25/12 0955 12/26/12 0412  WBC 8.0 8.5 4.8 4.3  NEUTROABS 7.5  --   --   --   HGB 10.2* 9.9* 9.3* 8.5*  HCT 29.6* 28.4* 27.2* 24.4*  MCV 99.0 98.6 99.3 98.8  PLT 37* 33* 25* 20*   Cardiac Enzymes: No results found for this basename: CKTOTAL, CKMB, CKMBINDEX, TROPONINI,  in the last 168 hours BNP (last 3 results)  Recent Labs  12/23/12 1244  PROBNP 586.7*   CBG: No results found for this basename: GLUCAP,  in the last 168 hours  No results found for this or any previous visit (from the past 240 hour(s)).   Studies: No results found.  Scheduled Meds: . feeding supplement  237 mL Oral TID BM  . hydrocerin   Topical BID  . magic mouthwash  15 mL Oral QID  . mirtazapine  7.5 mg Oral QHS  . predniSONE  5 mg Oral Daily  . vancomycin  500 mg Intravenous Q24H   Continuous Infusions:   Time spent:  35 min  Imir Brumbach L  [facility] Pager [phone]. If 7PM-7AM, please contact night-coverage at [email or web address], password [facility] 12/26/2012, 1:45 PM  LOS: 3 days

## 2012-12-26 NOTE — Clinical Social Work Placement (Signed)
Clinical Social Work Department CLINICAL SOCIAL WORK PLACEMENT NOTE 12/26/2012  Patient:  DEVONIA, FARRO  Account Number:  [id number] Admit date:  12/23/2012  Clinical Social Worker:  Lavell Luster  Date/time:  12/26/2012 08:13 PM  Clinical Social Work is seeking post-discharge placement for this patient at the following level of care:   SKILLED NURSING   (*CSW will update this form in Epic as items are completed)   12/26/2012  Patient/family provided with [facility] Department of Clinical Social Work's list of facilities offering this level of care within the geographic area requested by the patient (or if unable, by the patient's family).  12/26/2012  Patient/family informed of their freedom to choose among providers that offer the needed level of care, that participate in Medicare, Medicaid or managed care program needed by the patient, have an available bed and are willing to accept the patient.  12/26/2012  Patient/family informed of [facility]' ownership interest in [facility], as well as of the fact that they are under no obligation to receive care at this facility.  PASARR submitted to [employer] on 12/26/2012 PASARR number received from [employer] on   FL2 transmitted to all facilities in geographic area requested by pt/family on  12/26/2012 FL2 transmitted to all facilities within larger geographic area on   Patient informed that his/her managed care company has contracts with or will negotiate with  certain facilities, including the following:     Patient/family informed of bed offers received:   Patient chooses bed at  Physician recommends and patient chooses bed at    Patient to be transferred to  on   Patient to be transferred to facility by   The following physician request were entered in Epic:   Additional Comments:   Roddie Mc, [place], [place], [phone]

## 2012-12-26 NOTE — Progress Notes (Signed)
Nurse tech reported to RN that patient was coughing up a little bit of blood. RN assessed patient. Patient has 3-4 napkins of spatted blood. Patient stated that she is always coughing up blood. Pt denied SOB, chills,and pain. RN notifying MD now.

## 2012-12-26 NOTE — Clinical Social Work Psychosocial (Signed)
Clinical Social Work Department BRIEF PSYCHOSOCIAL ASSESSMENT 12/26/2012  Patient:  [name], [name]     Account Number:  [id number]     Admit date:  12/23/2012  Clinical Social Worker:  Lavell Luster  Date/Time:  12/26/2012 03:00 PM  Referred by:  Physician  Date Referred:  12/26/2012 Referred for  SNF Placement   Other Referral:   Interview type:  Other - See comment Other interview type:   CSW met with patient, and per patient's requested contacted daughter Arline Asp over phone to include her in discussion.    PSYCHOSOCIAL DATA Living Status:  ALONE Admitted from facility:   Level of care:   Primary support name:  Rise Patience [phone] Primary support relationship to patient:  CHILD, ADULT Degree of support available:   Support is strong.    CURRENT CONCERNS Current Concerns  Post-Acute Placement   Other Concerns:    SOCIAL WORK ASSESSMENT / PLAN CSW met with patient to discuss recommendation for SNF and explain SNF search process. Patient is alert and oriented x4. Patient is easily overwhelmed so CSW called daughter to have over speaker phone to put patient at ease. Patient admitted from home alone, but is agreeable to SNF search for post DC. Patient has no previous SNF experience. Daughter prefers [facility], [facility], or [facility], but understands that these SNFs cannot be guaranteed.   Assessment/plan status:  Psychosocial Support/Ongoing Assessment of Needs Other assessment/ plan:   Complete FL2, PASRR, Fax out   Information/referral to community resources:   SNF list and CSW contact information given to patient.    PATIENT'S/FAMILY'S RESPONSE TO PLAN OF CARE: Patient was pleasant, but appeared nervous and overwhelmed by information. Daughter states that this is normal for patient. Daughter and patient were both appreciative of CSW contact. Both are agreeable to SNF search. Patient and daughter await bed offers.       Roddie Mc,  [place], [place], [phone]

## 2012-12-27 LAB — CBC
HCT: 24.6 % — ABNORMAL LOW (ref 36.0–46.0)
Hemoglobin: 8.7 g/dL — ABNORMAL LOW (ref 12.0–15.0)
MCH: 34.7 pg — ABNORMAL HIGH (ref 26.0–34.0)
MCHC: 35.4 g/dL (ref 30.0–36.0)
MCV: 98 fL (ref 78.0–100.0)
Platelets: 60 10*3/uL — ABNORMAL LOW (ref 150–400)
RBC: 2.51 MIL/uL — ABNORMAL LOW (ref 3.87–5.11)
RDW: 13.1 % (ref 11.5–15.5)
WBC: 4.1 10*3/uL (ref 4.0–10.5)

## 2012-12-27 LAB — PREPARE PLATELET PHERESIS: Unit division: 0

## 2012-12-27 LAB — VANCOMYCIN, TROUGH: Vancomycin Tr: 6.9 ug/mL — ABNORMAL LOW (ref 10.0–20.0)

## 2012-12-27 MED ORDER — VANCOMYCIN HCL IN DEXTROSE 750-5 MG/150ML-% IV SOLN
750.0000 mg | INTRAVENOUS | Status: DC
  Administered 2012-12-27: 750 mg via INTRAVENOUS
  Filled 2012-12-27 (×2): qty 150

## 2012-12-27 MED ORDER — FOLIC ACID 1 MG PO TABS
1.0000 mg | ORAL_TABLET | Freq: Every day | ORAL | Status: DC
  Administered 2012-12-27 – 2012-12-28 (×2): 1 mg via ORAL
  Filled 2012-12-27 (×2): qty 1

## 2012-12-27 NOTE — Progress Notes (Signed)
CSW faxed over clinicals to [facility] for SNF auth. Left message with representative and just waiting on authorization approval. CSW will update when new information arises.  Maree Krabbe, MSW, [facility]resia Majors [phone]

## 2012-12-27 NOTE — Progress Notes (Signed)
[facility] PROGRESS NOTE  [name] ZOX:[id number] DOB: Apr 24, 1929 DOA: 12/23/2012 PCP: Lupita Raider, MD  Summary: 79 year old white female with rheumatoid arthritis on methotrexate, lives alone presented with left leg pain redness and swelling. Found to have left leg cellulitis, aphtous stomatitis and new thrombocytopenia and anemia. Dr. Janne Lab reports that her hemoglobin was about 12 a few months ago. Platelet count was within normal limits a few months ago as well. Also has been very depressed and losing weight. Started on Remeron during this hospitalization  Assessment/Plan:  Left leg Cellulitis:   failed outpatient doxycycline. Continue vancomycin for another 24-48 hours. Improving daily.  Thrombocytopenia:   likely secondary to MTX, which has been stopped.  Discussed with Dr. Nickola Major who agrees.  Has f/u appointment on 9/22.    Continue same pred dose. Discussed with Dr. [name] 9/9. He reports that methotrexate may cause a nadir in counts on day 7-10. She took methotrexate 7 days ago.   Patient is having oozing from the nasopharynx, keeping her up at night. Dr. Truett Perna recommends a transfusion of one unit of platelets.   If bleeding continues after transfusion, may take Amicar 1 g by mouth twice a day until results.  Would keep patient in the hospital until platelet counts stopped dropping.   Hematology will be available if patient continues to have problems, but likely this will just require time, sometimes several weeks.  Anemia:    Of chronic disease, and likely methotrexate related as well. White blood cell count locally has remained stable  Aphthous stomatitis:    Secondary to methotrexate. Still with ulcerations, but magic mouthwash without lidocaine has been helping.  Bleeding from nasopharynx:    blood in the nasopharynx, NOT hemoptysis. Secondary to mucositis and Thrombocytopenia.   H/O malignant neoplasm of upper lobe, bronchus or lung, s/p  XRT  Rheumatoid arthritis-severe, with deconditioning  COPD,   mild,Stable  Protein calorie malnutrition,   severe.  Depression:    Remeron started inpatient.  Titrated up to 15 mg.  Code Status: Full Family Communication: daughter, cindy Disposition Plan: possibly short-term skilled nursing facility  Consultants:  PT, OT, dietitian  Procedures:    Antibiotics:  Vancomycin 9/6  HPI/Subjective: Complaining about food (not enough, wrong type).  Anxious about being asked questions - "don't people understand my answer may be different the next time you ask?"  Objective: Filed Vitals:   12/27/12 0613  BP: 145/56  Pulse: 58  Temp: 98.7 F (37.1 C)  Resp: 18    Intake/Output Summary (Last 24 hours) at 12/27/12 1248 Last data filed at 12/27/12 0259  Gross per 24 hour  Intake 1172.5 ml  Output      0 ml  Net 1172.5 ml   Filed Weights   12/23/12 1700  Weight: 39.508 kg (87 lb 1.6 oz)    Exam:   General:  frail. Talkative. Slightly anxious  HEENT: Moist mucous membranes. Aphthous ulcers continue to be noted on buccal mucosa and columns. Bringing up bloody sputum.  Cardiovascular: regular rate rhythm without murmurs gallops rubs  Respiratory: clear to auscultation bilaterally without wheezes rhonchi or rales  Abdomen: soft nontender nondistended  Musculoskeletal: left leg cellulitis slowly improving  Data Reviewed: Basic Metabolic Panel:  Recent Labs Lab 12/23/12 1244 12/24/12 0545  NA 137 134*  K 2.9* 3.6  CL 98 101  CO2 28 23  GLUCOSE 102* 94  BUN 23 17  CREATININE 0.93 0.81  CALCIUM 8.8 7.8*   Liver Function Tests:  Recent Labs Lab  12/23/12 1244 12/24/12 0545  AST 20 18  ALT 11 9  ALKPHOS 50 42  BILITOT 1.1 1.0  PROT 6.1 5.2*  ALBUMIN 3.0* 2.4*   CBC:  Recent Labs Lab 12/23/12 1244 12/23/12 1600 12/25/12 0955 12/26/12 0412 12/27/12 0250  WBC 8.0 8.5 4.8 4.3 4.1  NEUTROABS 7.5  --   --   --   --   HGB 10.2* 9.9*  9.3* 8.5* 8.7*  HCT 29.6* 28.4* 27.2* 24.4* 24.6*  MCV 99.0 98.6 99.3 98.8 98.0  PLT 37* 33* 25* 20* 60*   BNP (last 3 results)  Recent Labs  12/23/12 1244  PROBNP 586.7*     Studies: No results found.  Scheduled Meds: . feeding supplement  237 mL Oral TID BM  . folic acid  1 mg Oral Daily  . hydrocerin   Topical BID  . magic mouthwash  15 mL Oral QID  . mirtazapine  15 mg Oral QHS  . predniSONE  5 mg Oral Daily  . vancomycin  750 mg Intravenous Q24H   Continuous Infusions:    Conley Canal  [facility] Pager [phone]. If 7PM-7AM, please contact night-coverage at [email or web address], password [facility] 12/27/2012, 12:48 PM  LOS: 4 days   Attending - Patient seen and examined, agree with the above assessment and plan. Cellulitis seems to be improving in the left lower extremity. Continue with vancomycin. Platelet count was much better after transfusion, we'll continue to monitor closely.  Windell Norfolk MD

## 2012-12-27 NOTE — Progress Notes (Signed)
Physical Therapy Treatment Patient Details Name: [name] MRN: [id number] DOB: [birth year] Today's Date: 12/27/2012 Time: 4098-1191 PT Time Calculation (min): 28 min  PT Assessment / Plan / Recommendation  History of Present Illness 79 y.o. 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   PT Comments   Pt is progressing as she was able to ambulate 140' today with RW and min guard. Pt able to complete seated LE exercise, except for L LE ankle pumps secondary to L LE pain. Pt would continue to benefit from therapy in order to improve functional mobility and safety.  Follow Up Recommendations  SNF     Does the patient have the potential to tolerate intense rehabilitation     Barriers to Discharge        Equipment Recommendations  None recommended by PT    Recommendations for Other Services    Frequency Min 3X/week   Progress towards PT Goals Progress towards PT goals: Progressing toward goals  Plan Current plan remains appropriate    Precautions / Restrictions Precautions Precautions: Fall Restrictions Weight Bearing Restrictions: No   Pertinent Vitals/Pain Pt reports L LE pain is 9/10 during amb, pt's socks repositioned during ambulation and pt repositioned into sitting upon returning to room.    Mobility  Bed Mobility Bed Mobility: Supine to Sit Supine to Sit: 5: Supervision;With rails Details for Bed Mobility Assistance: Pt  required VC's for set up during supine to sit and an increased amount of time required to perform task as pt requested that her belongings be moved to specified places before she would sit up. Transfers Transfers: Sit to Stand;Stand to Sit Sit to Stand: 4: Min guard;With upper extremity assist;From bed;From chair/3-in-1;With armrests Stand to Sit: To chair/3-in-1;To bed;4: Min guard;With upper extremity assist;With armrests Details for Transfer Assistance: VC's for proper hand placement on bed/RW during sit to stand and min guard to ensure safety. Ambulation/Gait Ambulation/Gait Assistance: 4: Min guard Ambulation Distance (Feet): 140 Feet Assistive device: Rolling walker Ambulation/Gait Assistance Details: Pt required VC's to stay within RW during amb. and min guard required in order to keep pt focused on task. Gait Pattern: Step-through pattern;Decreased stride length;Trunk flexed Gait velocity: decreased    Exercises General Exercises - Lower Extremity Ankle Circles/Pumps: AROM;Right;10 reps;Left;5 reps (R LE: x10,L LE: only completed 5 reps due to pain in L LE) Long Arc Quad: AROM;Seated;Both;10 reps Hip Flexion/Marching: AROM;Seated;Both;20 reps   PT Diagnosis:    PT Problem List:   PT Treatment Interventions:     PT Goals (current goals can now be found in the care plan section)    Visit Information  Last PT Received On: 12/27/12 Assistance Needed: +1 History of Present Illness: 79 y.o. female, known h/o Lung Ca diag 2013 2.7 cm clinical stage IA squamous cell carcinoma of the right upper lung S/p Curative SBRT 05/02/2012, 05/04/2012, 05/09/2012 to 54 Gy in 3 fractions (She has left lower lung  nodule also, which was negative on PET and has not been biopsied), COPD, Severe Rh arthritis on MTx had apparently finger surgery in the past, who presented to her primary care physician's office on 9 4 2014-she saw the practitioner there at that time for swelling of the left lower extremity  with redness localized to the top of the ankle and was prescribed 100 mg twice a day of doxycycline.    Subjective Data      Cognition  Cognition Arousal/Alertness: Awake/alert Behavior During Therapy: WFL for tasks assessed/performed;Anxious Overall Cognitive Status: Within Functional Limits for tasks assessed    Balance     End of Session PT - End of Session Equipment Utilized During Treatment: Gait belt Activity Tolerance: Patient limited by pain;Patient limited by fatigue Patient left: in bed;with call bell/phone within reach Nurse Communication: Other (comment) (Pt sitting EOB at the end of session, nurse tech notified.)   GP     Sol Blazing 12/27/2012, 1:57 PM

## 2012-12-27 NOTE — Progress Notes (Signed)
I have reviewed this note and agree with all findings. Kati Jazyiah Yiu, PT, DPT Pager: [phone]   

## 2012-12-27 NOTE — Progress Notes (Signed)
ANTIBIOTIC CONSULT NOTE   Pharmacy Consult for vancomycin Indication: cellulitis  Allergies  Allergen Reactions  . Latex Other (See Comments)    Blisters on her mouth  . Boniva [Ibandronic Acid]     Muscle aches  . Darvocet [Propoxyphene-Acetaminophen] Nausea And Vomiting  . Erythromycin Nausea And Vomiting  . Morphine And Related Nausea And Vomiting  . Oxycontin [Oxycodone Hcl Er] Nausea And Vomiting  . Sulfa Antibiotics Nausea And Vomiting  . Tramadol Nausea And Vomiting  . Vicodin [Hydrocodone-Acetaminophen]     dizzy    Patient Measurements: Height: 4\' 11"  (149.9 cm) Weight: 87 lb 1.6 oz (39.508 kg) IBW/kg (Calculated) : 43.2   Vital Signs: Temp: 99.3 F (37.4 C) (09/09 2110) Temp src: Oral (09/09 2110) BP: 138/63 mmHg (09/09 2110) Pulse Rate: 70 (09/09 2110) Intake/Output from previous day: 09/09 0701 - 09/10 0700 In: 1312.5 [P.O.:360; I.V.:250; Blood:502.5; IV Piggyback:200] Out: -  Intake/Output from this shift: Total I/O In: 200 [IV Piggyback:200] Out: -   Labs:  Recent Labs  12/24/12 0545 12/25/12 0955 12/26/12 0412 12/27/12 0250  WBC  --  4.8 4.3 4.1  HGB  --  9.3* 8.5* 8.7*  PLT  --  25* 20* 60*  CREATININE 0.81  --   --   --    Estimated Creatinine Clearance: 34.5 ml/min (by C-G formula based on Cr of 0.81).  Recent Labs  12/27/12 0250  VANCOTROUGH 6.9*    Assessment: 79 yo female with cellulitis for empiric vancomycin   Goal of Therapy:  Vancomycin trough = 10-15  Plan:  Change vancomycin 750 mg IV q24h with next dose  Javaun Dimperio, Gary Fleet 12/27/2012,4:21 AM

## 2012-12-28 LAB — CBC
HCT: 24.5 % — ABNORMAL LOW (ref 36.0–46.0)
Hemoglobin: 8.4 g/dL — ABNORMAL LOW (ref 12.0–15.0)
MCH: 34.1 pg — ABNORMAL HIGH (ref 26.0–34.0)
MCHC: 34.3 g/dL (ref 30.0–36.0)
MCV: 99.6 fL (ref 78.0–100.0)
Platelets: 49 10*3/uL — ABNORMAL LOW (ref 150–400)
RBC: 2.46 MIL/uL — ABNORMAL LOW (ref 3.87–5.11)
RDW: 13.3 % (ref 11.5–15.5)
WBC: 5 10*3/uL (ref 4.0–10.5)

## 2012-12-28 MED ORDER — MIRTAZAPINE 15 MG PO TABS: 15.0000 mg | ORAL_TABLET | Freq: Every day | ORAL | Status: DC

## 2012-12-28 MED ORDER — CLINDAMYCIN HCL 300 MG PO CAPS
600.0000 mg | ORAL_CAPSULE | Freq: Three times a day (TID) | ORAL | Status: DC
  Filled 2012-12-28 (×3): qty 2

## 2012-12-28 MED ORDER — FOLIC ACID 1 MG PO TABS: 1.0000 mg | ORAL_TABLET | Freq: Every day | ORAL | Status: DC

## 2012-12-28 MED ORDER — MAGIC MOUTHWASH: 15.0000 mL | Freq: Four times a day (QID) | ORAL | Status: DC

## 2012-12-28 MED ORDER — OXYCODONE HCL 5 MG PO TABS: 5.0000 mg | ORAL_TABLET | Freq: Four times a day (QID) | ORAL | Status: DC | PRN

## 2012-12-28 MED ORDER — HYDROCERIN EX CREA: 1.0000 "application " | TOPICAL_CREAM | Freq: Two times a day (BID) | CUTANEOUS | Status: DC

## 2012-12-28 MED ORDER — ENSURE COMPLETE PO LIQD: 237.0000 mL | Freq: Three times a day (TID) | ORAL | Status: DC

## 2012-12-28 MED ORDER — CLINDAMYCIN HCL 300 MG PO CAPS: 600.0000 mg | ORAL_CAPSULE | Freq: Three times a day (TID) | ORAL | Status: DC

## 2012-12-28 MED ORDER — CLINDAMYCIN HCL 300 MG PO CAPS: 600.0000 mg | ORAL_CAPSULE | Freq: Three times a day (TID) | ORAL | Status: AC

## 2012-12-28 NOTE — Discharge Summary (Signed)
Physician Discharge Summary  [name] ZOX:[id number] DOB: [birth year] DOA: 12/23/2012  PCP: Lupita Raider, MD  Admit date: 12/23/2012 Discharge date: 12/28/2012  Time spent: 45 minutes  Recommendations for Outpatient Follow-up:   Follow up with Rheumatologist on 9/22 (Dr. Nickola Major)  OT/PT to increase patient's independence and safety with mobility    Continue soft food diet until mouth sores are healed - D3  Skin care / wound care to blister on left foot.  Magic mouthwash until aphthous ulcerations in mouth are healed.  CBC q 48 hours until platelets have stablized.  Discharge Diagnoses:  Principal Problem:   Cellulitis Active Problems:   Malignant neoplasm of upper lobe, bronchus or lung   Rheumatoid arthritis-severe   COPD, mild   Osteoporosis   Anemia of chronic disease   Thrombocytopenia, unspecified   Aphthous stomatitis   Bleeding from nasopharynx   Protein-calorie malnutrition, severe   Depression   Discharge Condition: Stable  Diet recommendation: Soft foods until mouth sores heal. Progress to solid foods as tolerated.   Filed Weights   12/23/12 1700  Weight: 39.508 kg (87 lb 1.6 oz)    History of present illness at admission:  [name] is a 79 y.o. female, with a known history of Lung Ca (diag 2013 2.7 cm clinical stage IA squamous cell carcinoma of the right upper lung S/p Curative SBRT 05/02/2012, 05/04/2012, 05/09/2012 to 54 Gy in 3 fractions, she has left lower lung nodule also, which was negative on PET and has not been biopsied), COPD, Severe Rh arthritis on MTx , who presented to her primary care physician's office on 9 4 2014-for swelling of the left lower extremity with redness localized to the top of the ankle and was prescribed 100 mg twice a day of doxycycline.  She states the redness spread over the course of the day, she wanted to come to the emergency room.  Since 12/18/12 she states that she has been coughing small amounts of  blood. She  describes the amount as being less than a quarter size.  She states that she's also had pain in the front of her mouth.  Hospital Course:   Cellulitis  Improved significantly with treatment. Treated with IV Vancomycin for 6 days Continue with Clindamycin 600 mg TID for 2 more days and then stop Follow up with PCP for evaluation of cellulitis  Aphthous stomatitis Secondary to MTX toxicity Treated with Magic Mouthwash  Added Folic Acid tablet to counteract MTX toxicity levels  Continue soft food diet until mouth is healed, advance diet as tolerated  Thrombocytopenia Possibly secondary to MTX  Transfusion of one unit of platelets on 12/26/12-remain stable but still low. No signs of bleeding over the past 2 days. Check CBC every 48 hours until they have stabilized. Please note Dr Lendell Caprice d/w [name] (Hem/Onc)  9/9. He reports that methotrexate may cause a nadir in counts on day 7-10. Monitor for bleeding-currently none. If bleeding continues consider starting Amicar 1 gm daily. Please continue to monitor closely, per hematology, this will just require time, sometimes several weeks.  Anemia Chronic disease and secondary to MTX WBC has locally remained stable on CBC  Rheumatoid Arthritis Severely advanced disease D/C MTX.  Follow up with Rheumatologist on 9/22 (Dr. Nickola Major) Currently just on prednisone-that she takes chronically  Depression Started Remeron inpatient Stable for PCP follow up   Consultations:   OT- Patient would benefit from continued OT with SNF rehab due to impaired ability to perform ADL's and self care.  Discharge Exam: Filed Vitals:   12/28/12 0800  BP: 119/52  Pulse: 65  Temp: 98.4 F (36.9 C)  Resp: 18    General: Well-developed, thin framed female, slightly anxious  HEENT: normocephalic, moist mucous membranes, aphthous ulcers continued to be seen on the buccal mucosa and columns, coughing up bloody sputum.  Cardiovascular: RRR, no murmurs, rubs,  or gallops Respiratory: CTA bilaterally, no rhonchi, rales, or wheezes Abdomen: soft, non-tender and non-distended.  Musculoskeletal: improving left leg cellulitis, slightly erythematous with a 7mm clear blister above the medial malleolus.   Discharge Instructions      Discharge Orders   Future Appointments Provider Department Dept Phone   05/04/2013 2:00 PM [facility] ONCOL[facility]Y [phone]   05/07/2013 2:00 PM [facility] [phone]   Patient to arrive 15 minutes prior to appointment time.   05/10/2013 2:00 PM [name], MD  [facility] [phone]   Future Orders Complete By Expires   Diet general  As directed    Comments:     Dysphagia 3.  Soft diet until mouth pain has resolved.   Increase activity slowly  As directed        Medication List    STOP taking these medications       Calcium Carbonate 1500 MG Tabs     doxycycline 100 MG tablet  Commonly known as:  VIBRA-TABS     methotrexate 2.5 MG tablet  Commonly known as:  RHEUMATREX      TAKE these medications       alendronate 70 MG tablet  Commonly known as:  FOSAMAX  Take 70 mg by mouth See admin instructions. On Sundays; Take with a full glass of water on an empty stomach.     clindamycin 300 MG capsule  Commonly known as:  CLEOCIN  Take 2 capsules (600 mg total) by mouth 3 (three) times daily.     feeding supplement Liqd  Take 237 mLs by mouth 3 (three) times daily between meals.     folic acid 1 MG tablet  Commonly known as:  FOLVITE  Take 1 tablet (1 mg total) by mouth daily.     furosemide 20 MG tablet  Commonly known as:  LASIX  Take 20 mg by mouth daily as needed for fluid or edema.     hydrocerin Crea  Apply 1 application topically 2 (two) times daily.     magic mouthwash Soln  Take 15 mLs by mouth 4 (four) times daily.     mirtazapine 15 MG tablet  Commonly known as:   REMERON  Take 1 tablet (15 mg total) by mouth at bedtime.     multivitamin with minerals Tabs tablet  Take 1 tablet by mouth daily.     oxyCODONE 5 MG immediate release tablet  Commonly known as:  Oxy IR/ROXICODONE  Take 1 tablet (5 mg total) by mouth every 6 (six) hours as needed.     predniSONE 1 MG tablet  Commonly known as:  DELTASONE  Take 5 mg by mouth daily.       Allergies  Allergen Reactions  . Latex Other (See Comments)    Blisters on her mouth  . Boniva [Ibandronic Acid]     Muscle aches  . Darvocet [Propoxyphene-Acetaminophen] Nausea And Vomiting  . Erythromycin Nausea And Vomiting  . Morphine And Related Nausea And Vomiting  . Oxycontin [Oxycodone Hcl Er] Nausea And Vomiting  . Sulfa  Antibiotics Nausea And Vomiting  . Tramadol Nausea And Vomiting  . Vicodin [Hydrocodone-Acetaminophen]     dizzy      The results of significant diagnostics from this hospitalization (including imaging, microbiology, ancillary and laboratory) are listed below for reference.    Significant Diagnostic Studies: Dg Chest 2 View  12/23/2012   *RADIOLOGY REPORT*  Clinical Data: Leg pain.  Weakness.  CHEST - 2 VIEW  Comparison: 04/13/2012  Findings: Hyperinflation/COPD.  Osteopenia.  Convex left lumbar spine curvature which is moderate to marked.  Normal heart size with aortic atherosclerosis. Midline trachea.  No pleural effusion or pneumothorax.  Grossly similar appearance of right upper lobe scarring and postsurgical changes.  Nodular density projecting over the left lung base laterally likely corresponds to a area of pleural-based nodularity on image 42 of the prior CT.  No new consolidation.  IMPRESSION: Hyperinflation/COPD with grossly similar right upper lobe scarring.  No acute superimposed process.   Original Report Authenticated By: Jeronimo Greaves, M.D.    Labs: Basic Metabolic Panel:  Recent Labs Lab 12/23/12 1244 12/24/12 0545  NA 137 134*  K 2.9* 3.6  CL 98 101  CO2 28 23   GLUCOSE 102* 94  BUN 23 17  CREATININE 0.93 0.81  CALCIUM 8.8 7.8*   Liver Function Tests:  Recent Labs Lab 12/23/12 1244 12/24/12 0545  AST 20 18  ALT 11 9  ALKPHOS 50 42  BILITOT 1.1 1.0  PROT 6.1 5.2*  ALBUMIN 3.0* 2.4*   CBC:  Recent Labs Lab 12/23/12 1244 12/23/12 1600 12/25/12 0955 12/26/12 0412 12/27/12 0250 12/28/12 0704  WBC 8.0 8.5 4.8 4.3 4.1 5.0  NEUTROABS 7.5  --   --   --   --   --   HGB 10.2* 9.9* 9.3* 8.5* 8.7* 8.4*  HCT 29.6* 28.4* 27.2* 24.4* 24.6* 24.5*  MCV 99.0 98.6 99.3 98.8 98.0 99.6  PLT 37* 33* 25* 20* 60* 49*   BNP: BNP (last 3 results)  Recent Labs  12/23/12 1244  PROBNP 586.7*      Signed:  Micah Noel, SARA A PA-S  Algis Downs, PA-C [facility] 12/28/2012, 1:06 PM  Attending Patient seen and examined, better today, erythema in the left leg has improved even compared to yesterday, platelet count has dropped slightly, she is however stable for discharge, platelet count can be monitored at the SNF.  S Yovanni Frenette

## 2012-12-28 NOTE — Progress Notes (Signed)
Report given to Katrina B.

## 2012-12-28 NOTE — Clinical Social Work Placement (Signed)
Clinical Social Work Department CLINICAL SOCIAL WORK PLACEMENT NOTE 12/28/2012  Patient:  [name], [name]  Account Number:  [id number] Admit date:  12/23/2012  Clinical Social Worker:  Lavell Luster  Date/time:  12/26/2012 08:13 PM  Clinical Social Work is seeking post-discharge placement for this patient at the following level of care:   SKILLED NURSING   (*CSW will update this form in Epic as items are completed)   12/26/2012  Patient/family provided with [facility] Department of Clinical Social Work's list of facilities offering this level of care within the geographic area requested by the patient (or if unable, by the patient's family).  12/26/2012  Patient/family informed of their freedom to choose among providers that offer the needed level of care, that participate in Medicare, Medicaid or managed care program needed by the patient, have an available bed and are willing to accept the patient.  12/26/2012  Patient/family informed of [facility]' ownership interest in [facility], as well as of the fact that they are under no obligation to receive care at this facility.  PASARR submitted to [employer] on 12/26/2012 PASARR number received from [employer] on   FL2 transmitted to all facilities in geographic area requested by pt/family on  12/26/2012 FL2 transmitted to all facilities within larger geographic area on   Patient informed that his/her managed care company has contracts with or will negotiate with  certain facilities, including the following:     Patient/family informed of bed offers received:  12/27/2012 Patient chooses bed at [facility] Physician recommends and patient chooses bed at    Patient to be transferred to [facility] on  12/28/2012 Patient to be transferred to facility by Daughter's personal car  The following physician request were entered in Epic:   Additional Comments: Per MD patient ready  for DC on 12/28/12. Patient DC to [facility] on 12/28/12. Patient, daughter, and facility notified of DC. Nurse called facility to give report. CSW signing off.   Roddie Mc, [place], [place], [phone]

## 2012-12-28 NOTE — Progress Notes (Signed)
Attempted to call report to nurse at [facility].  Left a message for the RN to call at convenient time.  Will try again.

## 2012-12-28 NOTE — Progress Notes (Signed)
PIV removed.  Packet for [place] given to daughter.  Pt taken to drop off location via wheelchair.

## 2012-12-29 ENCOUNTER — Other Ambulatory Visit: Payer: Self-pay

## 2012-12-29 MED ORDER — OXYCODONE HCL 5 MG PO TABS: 5.0000 mg | ORAL_TABLET | Freq: Four times a day (QID) | ORAL | Status: DC | PRN

## 2012-12-29 NOTE — Telephone Encounter (Signed)
Verified dose and instructions reflect manual request received by nursing home.   

## 2013-01-03 ENCOUNTER — Non-Acute Institutional Stay (SKILLED_NURSING_FACILITY): Payer: Medicare Other | Admitting: Internal Medicine

## 2013-01-03 ENCOUNTER — Encounter: Payer: Self-pay | Admitting: Internal Medicine

## 2013-01-03 DIAGNOSIS — L0390 Cellulitis, unspecified: Secondary | ICD-10-CM

## 2013-01-03 DIAGNOSIS — L0291 Cutaneous abscess, unspecified: Secondary | ICD-10-CM

## 2013-01-03 DIAGNOSIS — D696 Thrombocytopenia, unspecified: Secondary | ICD-10-CM

## 2013-01-03 DIAGNOSIS — F329 Major depressive disorder, single episode, unspecified: Secondary | ICD-10-CM

## 2013-01-03 DIAGNOSIS — F3289 Other specified depressive episodes: Secondary | ICD-10-CM

## 2013-01-03 DIAGNOSIS — F32A Depression, unspecified: Secondary | ICD-10-CM

## 2013-01-03 DIAGNOSIS — K120 Recurrent oral aphthae: Secondary | ICD-10-CM

## 2013-01-03 DIAGNOSIS — M069 Rheumatoid arthritis, unspecified: Secondary | ICD-10-CM

## 2013-01-03 DIAGNOSIS — D638 Anemia in other chronic diseases classified elsewhere: Secondary | ICD-10-CM

## 2013-01-03 NOTE — Assessment & Plan Note (Signed)
Pt was started on remeron which is continued here

## 2013-01-03 NOTE — Progress Notes (Signed)
MRN: [id number] Name: [name]  Sex: female Age: 79 y.o. DOB: 03-30-1930  PSC #: Sonny Dandy  Facility/Room: 224 Level Of Care: SNF Provider: Merrilee Seashore D Emergency Contacts: Extended Emergency Contact Information Primary Emergency Contact: Kranz,[name] Address: [address]          [place], [place] [address] [place] of [place] Home Phone: [phone] Mobile Phone: [phone] Relation: Daughter Secondary Emergency Contact: Wynona Neat, [place] of [place] Home Phone: [phone] Mobile Phone: [phone] Relation: Daughter  Code Status: FULL  Allergies: Latex; Boniva; Darvocet; Erythromycin; Morphine and related; Oxycontin; Sulfa antibiotics; Tramadol; and Vicodin  Chief Complaint  Patient presents with  . nursing home admission    HPI: Patient is 79 y.o. female who was hosp for LLE cellulitus not responding to doxy as outpt. Pt was txed with IV vancomycin for 6 days and Clindamycin 600 mg TID until 12/30/2012.Pt was admitted to SNF rehab for OT due to impaired ability to perform ADL and self care. Pt says she is leaving Saturday 9/20.  Past Medical History  Diagnosis Date  . Hip pain, right   . Osteoporosis   . RA (rheumatoid arthritis)   . PAC (premature atrial contraction)   . Shingles   . HLD (hyperlipidemia)   . Hernia, umbilical   . COPD (chronic obstructive pulmonary disease)   . Lung mass   . History of radiation therapy 05/02/12-05/04/12,&05/09/12    rul lung 54Gy/64fx  . Cancer     lung ca    No past surgical history on file.    Medication List       This list is accurate as of: 01/03/13 11:03 AM.  Always use your most recent med list.               alendronate 70 MG tablet  Commonly known as:  FOSAMAX  Take 70 mg by mouth See admin instructions. On Sundays; Take with a full glass of water on an empty stomach.     feeding supplement Liqd  Take 237 mLs by mouth 3 (three) times daily between meals.      folic acid 1 MG tablet  Commonly known as:  FOLVITE  Take 1 tablet (1 mg total) by mouth daily.     furosemide 20 MG tablet  Commonly known as:  LASIX  Take 20 mg by mouth daily as needed for fluid or edema.     hydrocerin Crea  Apply 1 application topically 2 (two) times daily.     magic mouthwash Soln  Take 15 mLs by mouth 4 (four) times daily.     mirtazapine 15 MG tablet  Commonly known as:  REMERON  Take 1 tablet (15 mg total) by mouth at bedtime.     multivitamin with minerals Tabs tablet  Take 1 tablet by mouth daily.     oxyCODONE 5 MG immediate release tablet  Commonly known as:  Oxy IR/ROXICODONE  Take 1 tablet (5 mg total) by mouth every 6 (six) hours as needed.     predniSONE 1 MG tablet  Commonly known as:  DELTASONE  Take 5 mg by mouth daily.        No orders of the defined types were placed in this encounter.    Immunization History  Administered Date(s) Administered  . Influenza Whole 02/09/2012    History  Substance Use Topics  . Smoking status: Former Smoker    Types: Cigarettes  Quit date: 01/11/2010  . Smokeless tobacco: Never Used  . Alcohol Use: No    Family history is noncontributory    Review of Systems  DATA OBTAINED: from patient GENERAL: Feels well no fevers, fatigue, appetite changes SKIN: No itching, rash; dressing on leg is wet because I took shower EYES: No eye pain, redness, discharge EARS: No earache, tinnitus, change in hearing NOSE: No congestion, drainage or bleeding  MOUTH/THROAT: mouth pain is all gone except for an ulcer in R bottom lip;would like to use biotene RESPIRATORY: No cough, wheezing, SOB CARDIAC: No chest pain, palpitations, lower extremity edema  GI: No abdominal pain, No N/V/D or constipation, No heartburn or reflux  GU: No dysuria, frequency or urgency, or incontinence  MUSCULOSKELETAL: No unrelieved bone/joint pain NEUROLOGIC: no c/o PSYCHIATRIC: No overt anxiety or sadness. Sleeps well. No  behavior issue.  AMBULATION:  With walker  Filed Vitals:   01/03/13 1006  BP: 130/60  Pulse: 70  Temp: 99.2 F (37.3 C)  Resp: 20    Physical Exam  GENERAL APPEARANCE: Alert, conversant. Appropriately groomed. No acute distress.  SKIN: No diaphoresis rash; medial ankle no redness, heat or blister;pt does state it is TTP HEAD: Normocephalic, atraumatic  EYES: Conjunctiva/lids clear. Pupils round, reactive. EOMs intact.  EARS: External exam WNL, canals clear. Hearing grossly normal.  NOSE: No deformity or discharge.  MOUTH/THROAT: large healing ulcer MM inside R bottom lip  RESPIRATORY: Breathing is even, unlabored. Lung sounds are clear   CARDIOVASCULAR: Heart RRR 1/6 systolic murmurs, no rubs or gallops. No peripheral edema.  ARTERIAL: radial pulse 2+, DP pulse 1+  VENOUS: No varicosities. No venous stasis skin changes  GASTROINTESTINAL: Abdomen is soft, non-tender, not distended w/ normal bowel sounds.  MUSCULOSKELETAL: minimal changes c/w RA NEUROLOGIC: Oriented X3. Cranial nerves 2-12 grossly intact. Moves all extremities no tremor. PSYCHIATRIC: Mood and affect appropriate to situation, no behavioral issues  Patient Active Problem List   Diagnosis Date Noted  . Protein-calorie malnutrition, severe 12/25/2012  . Depression 12/25/2012  . Anemia of chronic disease 12/24/2012  . Thrombocytopenia, unspecified 12/24/2012  . Aphthous stomatitis 12/24/2012  . Bleeding from nasopharynx 12/24/2012  . Cellulitis 12/23/2012  . Hemoptysis? Secondary to either irritation by dentures, vs. secondary to lung neoplasm 12/23/2012  . Rheumatoid arthritis-severe 12/23/2012  . COPD, mild 12/23/2012  . Osteoporosis 12/23/2012  . Malignant neoplasm of upper lobe, bronchus or lung 03/21/2012    Functional assessment:   CBC    Component Value Date/Time   WBC 5.0 12/28/2012 0704   RBC 2.46* 12/28/2012 0704   HGB 8.4* 12/28/2012 0704   HCT 24.5* 12/28/2012 0704   PLT 49* 12/28/2012 0704    MCV 99.6 12/28/2012 0704   LYMPHSABS 0.5* 12/23/2012 1244   MONOABS 0.0* 12/23/2012 1244   EOSABS 0.0 12/23/2012 1244   BASOSABS 0.0 12/23/2012 1244    CMP     Component Value Date/Time   NA 134* 12/24/2012 0545   K 3.6 12/24/2012 0545   CL 101 12/24/2012 0545   CO2 23 12/24/2012 0545   GLUCOSE 94 12/24/2012 0545   BUN 17 12/24/2012 0545   CREATININE 0.81 12/24/2012 0545   CALCIUM 7.8* 12/24/2012 0545   PROT 5.2* 12/24/2012 0545   ALBUMIN 2.4* 12/24/2012 0545   AST 18 12/24/2012 0545   ALT 9 12/24/2012 0545   ALKPHOS 42 12/24/2012 0545   BILITOT 1.0 12/24/2012 0545   GFRNONAA 67* 12/24/2012 0545   GFRAA 77* 12/24/2012 0545    Assessment  and Plan  Cellulitis Finished treatment with vancomycin and clindamycin;there is no redness or blister but still tender to palpation ;will dress at night only now  Depression Pt was started on remeron which is continued here  Anemia of chronic disease And felt secondary to MTX which was d/c in hospital; 9/11  7.9/22.6;  9/14  8.2/23.8- appears stable, may be improving, certainly not dropping  Thrombocytopenia, unspecified Felt secondary to MTX;  9/12  PLT 54;  9/14  PLT 120; pt is getting CBC q 48 hrs-will follow until stable  Rheumatoid arthritis-severe Advance disease;Methorexate d/c in hospital;pt is to see rheumatology 9/22  Aphthous stomatitis Secondary to MTX- on magic mouthwash and Folic acid; mouth is really good except for one ulcer inside bottom lip; pt is refusing magic Mouthwash and would like biotene    Margit Hanks, MD

## 2013-01-03 NOTE — Assessment & Plan Note (Signed)
Secondary to MTX- on magic mouthwash and Folic acid; mouth is really good except for one ulcer inside bottom lip; pt is refusing magic Mouthwash and would like biotene

## 2013-01-03 NOTE — Assessment & Plan Note (Signed)
Advance disease;Methorexate d/c in hospital;pt is to see rheumatology 9/22

## 2013-01-03 NOTE — Assessment & Plan Note (Signed)
Felt secondary to MTX;  9/12  PLT 54;  9/14  PLT 120; pt is getting CBC q 48 hrs-will follow until stable

## 2013-01-03 NOTE — Assessment & Plan Note (Addendum)
Finished treatment with vancomycin and clindamycin;there is no redness or blister but still tender to palpation ;will dress at night only now

## 2013-01-03 NOTE — Assessment & Plan Note (Signed)
And felt secondary to MTX which was d/c in hospital; 9/11  7.9/22.6;  9/14  8.2/23.8- appears stable, may be improving, certainly not dropping

## 2013-01-04 ENCOUNTER — Encounter: Payer: Self-pay | Admitting: Internal Medicine

## 2013-01-04 ENCOUNTER — Non-Acute Institutional Stay (SKILLED_NURSING_FACILITY): Payer: Medicare Other | Admitting: Internal Medicine

## 2013-01-04 DIAGNOSIS — M069 Rheumatoid arthritis, unspecified: Secondary | ICD-10-CM

## 2013-01-04 DIAGNOSIS — F329 Major depressive disorder, single episode, unspecified: Secondary | ICD-10-CM

## 2013-01-04 DIAGNOSIS — K120 Recurrent oral aphthae: Secondary | ICD-10-CM

## 2013-01-04 DIAGNOSIS — J4489 Other specified chronic obstructive pulmonary disease: Secondary | ICD-10-CM

## 2013-01-04 DIAGNOSIS — L0390 Cellulitis, unspecified: Secondary | ICD-10-CM

## 2013-01-04 DIAGNOSIS — F3289 Other specified depressive episodes: Secondary | ICD-10-CM

## 2013-01-04 DIAGNOSIS — J449 Chronic obstructive pulmonary disease, unspecified: Secondary | ICD-10-CM

## 2013-01-04 DIAGNOSIS — L0291 Cutaneous abscess, unspecified: Secondary | ICD-10-CM

## 2013-01-04 DIAGNOSIS — F32A Depression, unspecified: Secondary | ICD-10-CM

## 2013-01-04 NOTE — Assessment & Plan Note (Signed)
Pt had been on methotrexate for years but she doesn't wish to be on it any longer;is to see her rheumatologist 01/08/2013

## 2013-01-04 NOTE — Progress Notes (Signed)
MRN: [id number] Name: [name]  Sex: female Age: 79 y.o. DOB: 1929/11/15  PSC #: Sonny Dandy Facility/Room: 223 Level Of Care: SNF Provider: Merrilee Seashore D Emergency Contacts: Extended Emergency Contact Information Primary Emergency Contact: Kranz,[name] Address: [address]          [place], [place] [address] [place] of [place] Home Phone: [phone] Mobile Phone: [phone] Relation: Daughter Secondary Emergency Contact: Wynona Neat, [place] of [place] Home Phone: [phone] Mobile Phone: [phone] Relation: Daughter  Allergies: Latex; Boniva; Darvocet; Erythromycin; Morphine and related; Oxycontin; Sulfa antibiotics; Tramadol; and Vicodin  Chief Complaint  Patient presents with  . Discharge Note    HPI: Patient is 79 y.o. female who is being d/c to home on Saturday 01/06/2013. She has no complaints and she is happy.   Past Medical History  Diagnosis Date  . Hip pain, right   . Osteoporosis   . RA (rheumatoid arthritis)   . PAC (premature atrial contraction)   . Shingles   . HLD (hyperlipidemia)   . Hernia, umbilical   . COPD (chronic obstructive pulmonary disease)   . Lung mass   . History of radiation therapy 05/02/12-05/04/12,&05/09/12    rul lung 54Gy/45fx  . Cancer     lung ca    History reviewed. No pertinent past surgical history.    Medication List       This list is accurate as of: 01/04/13  1:54 PM.  Always use your most recent med list.               alendronate 70 MG tablet  Commonly known as:  FOSAMAX  Take 70 mg by mouth See admin instructions. On Sundays; Take with a full glass of water on an empty stomach.     antiseptic oral rinse Liqd  15 mLs by Mouth Rinse route 4 (four) times daily as needed.     feeding supplement Liqd  Take 237 mLs by mouth 3 (three) times daily between meals.     folic acid 1 MG tablet  Commonly known as:  FOLVITE  Take 1 tablet (1 mg total) by mouth daily.      furosemide 20 MG tablet  Commonly known as:  LASIX  Take 20 mg by mouth daily as needed for fluid or edema.     hydrocerin Crea  Apply 1 application topically 2 (two) times daily.     mirtazapine 15 MG tablet  Commonly known as:  REMERON  Take 1 tablet (15 mg total) by mouth at bedtime.     multivitamin with minerals Tabs tablet  Take 1 tablet by mouth daily.     oxyCODONE 5 MG immediate release tablet  Commonly known as:  Oxy IR/ROXICODONE  Take 1 tablet (5 mg total) by mouth every 6 (six) hours as needed.     predniSONE 1 MG tablet  Commonly known as:  DELTASONE  Take 5 mg by mouth daily.        Meds ordered this encounter  Medications  . antiseptic oral rinse (BIOTENE) LIQD    Sig: 15 mLs by Mouth Rinse route 4 (four) times daily as needed.    Immunization History  Administered Date(s) Administered  . Influenza Whole 02/09/2012    History  Substance Use Topics  . Smoking status: Former Smoker    Types: Cigarettes    Quit date: 01/11/2010  . Smokeless tobacco: Never Used  . Alcohol Use: No  Filed Vitals:   01/04/13 1345  BP: 130/70  Pulse: 70  Temp: 99.2 F (37.3 C)  Resp: 20      Patient Active Problem List   Diagnosis Date Noted  . Protein-calorie malnutrition, severe 12/25/2012  . Depression 12/25/2012  . Anemia of chronic disease 12/24/2012  . Thrombocytopenia, unspecified 12/24/2012  . Aphthous stomatitis 12/24/2012  . Bleeding from nasopharynx 12/24/2012  . Cellulitis 12/23/2012  . Hemoptysis? Secondary to either irritation by dentures, vs. secondary to lung neoplasm 12/23/2012  . Rheumatoid arthritis-severe 12/23/2012  . COPD, mild 12/23/2012  . Osteoporosis 12/23/2012  . Malignant neoplasm of upper lobe, bronchus or lung 03/21/2012    CBC    Component Value Date/Time   WBC 5.0 12/28/2012 0704   RBC 2.46* 12/28/2012 0704   HGB 8.4* 12/28/2012 0704   HCT 24.5* 12/28/2012 0704   PLT 49* 12/28/2012 0704   MCV 99.6 12/28/2012  0704   LYMPHSABS 0.5* 12/23/2012 1244   MONOABS 0.0* 12/23/2012 1244   EOSABS 0.0 12/23/2012 1244   BASOSABS 0.0 12/23/2012 1244    CMP     Component Value Date/Time   NA 134* 12/24/2012 0545   K 3.6 12/24/2012 0545   CL 101 12/24/2012 0545   CO2 23 12/24/2012 0545   GLUCOSE 94 12/24/2012 0545   BUN 17 12/24/2012 0545   CREATININE 0.81 12/24/2012 0545   CALCIUM 7.8* 12/24/2012 0545   PROT 5.2* 12/24/2012 0545   ALBUMIN 2.4* 12/24/2012 0545   AST 18 12/24/2012 0545   ALT 9 12/24/2012 0545   ALKPHOS 42 12/24/2012 0545   BILITOT 1.0 12/24/2012 0545   GFRNONAA 67* 12/24/2012 0545   GFRAA 77* 12/24/2012 0545    Assessment and Plan  PATIENT IS BEING DISCHARGED TO HOME 01/06/2013 .  Cellulitis Almost completely resolved;will dress at night only now until pain free  Aphthous stomatitis Pt is on biotene which she says doesn't hurt her mouth like magic mouthwash;last area of irritation is resolving and pt is having no problems with eating  COPD, mild Has been stable  Rheumatoid arthritis-severe Pt had been on methotrexate for years but she doesn't wish to be on it any longer;is to see her rheumatologist 01/08/2013  Depression Pt is in very good spirits    Margit Hanks, MD

## 2013-01-04 NOTE — Assessment & Plan Note (Signed)
Has been stable

## 2013-01-04 NOTE — Assessment & Plan Note (Signed)
Pt is in very good spirits

## 2013-01-04 NOTE — Assessment & Plan Note (Signed)
Pt is on biotene which she says doesn't hurt her mouth like magic mouthwash;last area of irritation is resolving and pt is having no problems with eating

## 2013-01-04 NOTE — Assessment & Plan Note (Signed)
Almost completely resolved;will dress at night only now until pain free

## 2013-04-03 ENCOUNTER — Ambulatory Visit (INDEPENDENT_AMBULATORY_CARE_PROVIDER_SITE_OTHER): Payer: Medicare Other | Admitting: Podiatry

## 2013-04-03 ENCOUNTER — Encounter: Payer: Self-pay | Admitting: Podiatry

## 2013-04-03 VITALS — BP 118/59 | HR 71 | Resp 16

## 2013-04-03 DIAGNOSIS — B351 Tinea unguium: Secondary | ICD-10-CM

## 2013-04-03 DIAGNOSIS — M79609 Pain in unspecified limb: Secondary | ICD-10-CM

## 2013-04-04 NOTE — Progress Notes (Signed)
She presents today with a chief complaint of painful elongated toenails.  Objective: Nails are thick yellow dystrophic clinically mycotic painful palpation pulses are palpable bilateral.  Assessment: Pain in limb secondary to onychomycosis 1 through 5 bilateral.  Plan: Debridement of nails 1 through 5 bilateral covered service secondary pain.

## 2013-05-04 ENCOUNTER — Encounter (INDEPENDENT_AMBULATORY_CARE_PROVIDER_SITE_OTHER): Payer: Self-pay

## 2013-05-04 ENCOUNTER — Ambulatory Visit
Admission: RE | Admit: 2013-05-04 | Discharge: 2013-05-04 | Disposition: A | Discharge status: Home / Self Care | Payer: Medicare Other | Source: Ambulatory Visit | Attending: Radiation Oncology | Admitting: Radiation Oncology

## 2013-05-04 ENCOUNTER — Other Ambulatory Visit: Payer: Self-pay | Admitting: Radiation Oncology

## 2013-05-04 DIAGNOSIS — C3410 Malignant neoplasm of upper lobe, unspecified bronchus or lung: Secondary | ICD-10-CM

## 2013-05-04 LAB — BUN AND CREATININE (CC13)
BUN: 14.1 mg/dL (ref 7.0–26.0)
Creatinine: 0.8 mg/dL (ref 0.6–1.1)

## 2013-05-07 ENCOUNTER — Other Ambulatory Visit (HOSPITAL_COMMUNITY): Payer: Medicare Other

## 2013-05-07 ENCOUNTER — Ambulatory Visit (HOSPITAL_COMMUNITY)
Admission: RE | Admit: 2013-05-07 | Discharge: 2013-05-07 | Disposition: A | Discharge status: Home / Self Care | Payer: Medicare Other | Source: Ambulatory Visit | Attending: Radiation Oncology | Admitting: Radiation Oncology

## 2013-05-07 DIAGNOSIS — J984 Other disorders of lung: Secondary | ICD-10-CM | POA: Insufficient documentation

## 2013-05-07 DIAGNOSIS — C3490 Malignant neoplasm of unspecified part of unspecified bronchus or lung: Secondary | ICD-10-CM | POA: Insufficient documentation

## 2013-05-07 DIAGNOSIS — I700 Atherosclerosis of aorta: Secondary | ICD-10-CM | POA: Insufficient documentation

## 2013-05-07 DIAGNOSIS — R911 Solitary pulmonary nodule: Secondary | ICD-10-CM | POA: Insufficient documentation

## 2013-05-07 DIAGNOSIS — J438 Other emphysema: Secondary | ICD-10-CM | POA: Insufficient documentation

## 2013-05-09 ENCOUNTER — Encounter: Payer: Self-pay | Admitting: Radiation Oncology

## 2013-05-09 NOTE — Progress Notes (Signed)
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  Pt denies pain, SOB, fatigue, loss of appetite. She reports productive cough w/clear sputum. Her daughter is with her today                             ALLERGIES:  is allergic to latex; boniva; darvocet; erythromycin; morphine and related; oxycontin; sulfa antibiotics; tramadol; and vicodin.  Meds: Current Outpatient Prescriptions  Medication Sig Dispense Refill  . folic acid (FOLVITE) 1 MG tablet Take 1 tablet (1 mg total) by mouth daily.      . furosemide (LASIX) 20 MG tablet Take 20 mg by mouth daily as needed for fluid or edema.       . predniSONE (DELTASONE) 5 MG tablet       . sertraline (ZOLOFT) 50 MG tablet Take 50 mg by mouth daily.      . traZODone (DESYREL) 50 MG tablet Take 50 mg by mouth at bedtime.       No current facility-administered medications for this encounter.    Physical Findings: The patient is in no acute distress. Patient is alert and oriented.  weight is 96 lb (43.545 kg). Her oral temperature is 97.5 F (36.4 C). Her blood pressure is 146/59 and her pulse is 70. Her respiration is 20 and oxygen saturation is 100%. .  No significant changes.  Lab Findings: Lab Results  Component Value Date   WBC 5.0 12/28/2012   HGB 8.4* 12/28/2012   HCT 24.5* 12/28/2012   MCV 99.6 12/28/2012   PLT 49* 12/28/2012    @LASTCHEM @  Radiographic Findings: Ct Chest Wo  Contrast  05/07/2013   CLINICAL DATA:  Lung cancer, prior radiation. Shortness of Breath. Restaging.  EXAM: CT CHEST WITHOUT CONTRAST  TECHNIQUE: Multidetector CT imaging of the chest was performed following the standard protocol without IV contrast.  COMPARISON:  10/24/2012  FINDINGS: Platelike and linear densities are again noted in the right upper lobe, unchanged, most compatible with post treatment/radiation changes. Nodule in the left lower lobe measures 11 x 9 mm compared with 11 x 7 mm previously, not felt to be significantly changed. Small nodule in the right lower lobe on image 22 is stable. Linear scarring in both lung bases and apices.  Moderate COPD changes within the lungs. No pleural effusions. Aorta is calcified, non aneurysmal. Heart is normal size. No mediastinal, hilar, or axillary adenopathy. Chest wall soft tissues are unremarkable. Imaging into the upper abdomen shows no acute findings.  IMPRESSION: Stable platelike linear densities in the right upper lobe, likely postradiation.  Stable 11 mm nodule in the left lower lobe and smaller nodule in the right lower lobe.  Areas of scarring in both lung bases and apices.  Moderate emphysema.   Electronically Signed   By: Rolm Baptise M.D.   On: 05/07/2013 15:57    Impression:  The patient continues to respond to treatment.  She has a slightly larger subsolid left lower lung nodule.  Plan:  Chest CT in 6 months to assess lung nodules.  I discussed aortic and coronary calcifications with patient and her daughter.  _____________________________________  Sheral Apley. Tammi Klippel, M.D.

## 2013-05-10 ENCOUNTER — Encounter: Payer: Self-pay | Admitting: Radiation Oncology

## 2013-05-10 ENCOUNTER — Ambulatory Visit
Admission: RE | Admit: 2013-05-10 | Discharge: 2013-05-10 | Disposition: A | Discharge status: Home / Self Care | Payer: Medicare Other | Source: Ambulatory Visit | Attending: Radiation Oncology | Admitting: Radiation Oncology

## 2013-05-10 VITALS — BP 146/59 | HR 70 | Temp 97.5°F | Resp 20 | Wt 96.0 lb

## 2013-05-10 DIAGNOSIS — I2584 Coronary atherosclerosis due to calcified coronary lesion: Secondary | ICD-10-CM

## 2013-05-10 DIAGNOSIS — I700 Atherosclerosis of aorta: Secondary | ICD-10-CM

## 2013-05-10 DIAGNOSIS — C3410 Malignant neoplasm of upper lobe, unspecified bronchus or lung: Secondary | ICD-10-CM

## 2013-05-10 DIAGNOSIS — I2510 Atherosclerotic heart disease of native coronary artery without angina pectoris: Secondary | ICD-10-CM | POA: Insufficient documentation

## 2013-05-10 NOTE — Progress Notes (Signed)
Pt denies pain, SOB, fatigue, loss of appetite. She reports productive cough w/clear sputum. Her daughter is with her today.

## 2013-05-11 ENCOUNTER — Telehealth: Payer: Self-pay | Admitting: *Deleted

## 2013-05-11 NOTE — Telephone Encounter (Signed)
Called patient to inform of test and fu visit, lvm for a return call

## 2013-05-31 ENCOUNTER — Ambulatory Visit
Admission: RE | Admit: 2013-05-31 | Discharge: 2013-05-31 | Disposition: A | Discharge status: Home / Self Care | Payer: Medicare Other | Source: Ambulatory Visit | Attending: Family Medicine | Admitting: Family Medicine

## 2013-05-31 ENCOUNTER — Other Ambulatory Visit: Payer: Self-pay | Admitting: Family Medicine

## 2013-05-31 DIAGNOSIS — M79606 Pain in leg, unspecified: Secondary | ICD-10-CM

## 2013-07-24 ENCOUNTER — Ambulatory Visit: Payer: Medicare Other | Admitting: Podiatry

## 2013-07-27 ENCOUNTER — Ambulatory Visit (INDEPENDENT_AMBULATORY_CARE_PROVIDER_SITE_OTHER): Payer: Medicare Other | Admitting: Podiatry

## 2013-07-27 DIAGNOSIS — M79609 Pain in unspecified limb: Secondary | ICD-10-CM

## 2013-07-27 DIAGNOSIS — B351 Tinea unguium: Secondary | ICD-10-CM

## 2013-07-27 NOTE — Progress Notes (Signed)
She presents today with a chief complaint of painful toenails one through 5 bilateral.  Objective: Pulses are palpable bilateral. Nails are thick yellow dystrophic onychomycotic and painful palpation.  Assessment: Pain in limb secondary to onychomycosis 1 through 5 bilateral.   Plan: Debridement nails in thickness and length as cover service secondary to pain. He

## 2013-11-07 ENCOUNTER — Ambulatory Visit (HOSPITAL_COMMUNITY)
Admission: RE | Admit: 2013-11-07 | Discharge: 2013-11-07 | Disposition: A | Discharge status: Home / Self Care | Payer: Medicare Other | Source: Ambulatory Visit | Attending: Radiation Oncology | Admitting: Radiation Oncology

## 2013-11-07 ENCOUNTER — Ambulatory Visit (HOSPITAL_COMMUNITY): Payer: Medicare Other

## 2013-11-07 DIAGNOSIS — J8410 Pulmonary fibrosis, unspecified: Secondary | ICD-10-CM | POA: Insufficient documentation

## 2013-11-07 DIAGNOSIS — C3410 Malignant neoplasm of upper lobe, unspecified bronchus or lung: Secondary | ICD-10-CM

## 2013-11-07 DIAGNOSIS — J479 Bronchiectasis, uncomplicated: Secondary | ICD-10-CM | POA: Insufficient documentation

## 2013-11-07 DIAGNOSIS — C3490 Malignant neoplasm of unspecified part of unspecified bronchus or lung: Secondary | ICD-10-CM | POA: Insufficient documentation

## 2013-11-07 DIAGNOSIS — M479 Spondylosis, unspecified: Secondary | ICD-10-CM | POA: Insufficient documentation

## 2013-11-07 DIAGNOSIS — R0602 Shortness of breath: Secondary | ICD-10-CM | POA: Insufficient documentation

## 2013-11-07 DIAGNOSIS — I7090 Unspecified atherosclerosis: Secondary | ICD-10-CM | POA: Insufficient documentation

## 2013-11-12 ENCOUNTER — Telehealth: Payer: Self-pay | Admitting: Radiation Oncology

## 2013-11-12 NOTE — Telephone Encounter (Signed)
Patient returned this writer's call. Explained to patient her scan looks good and there is no evidence of recurrence at this time. Patient verbalized understanding and expressed appreciation for the call.

## 2013-11-12 NOTE — Telephone Encounter (Signed)
Phoned patient's home to inform her of chest CT results as ordered by Dr. Tammi Klippel. No answer. Left message requesting return call.

## 2013-11-13 ENCOUNTER — Ambulatory Visit: Payer: Medicare Other | Admitting: Podiatry

## 2013-11-15 ENCOUNTER — Ambulatory Visit: Payer: Medicare Other | Admitting: Radiation Oncology

## 2013-11-22 ENCOUNTER — Ambulatory Visit: Payer: Medicare Other | Admitting: Radiation Oncology

## 2013-12-11 ENCOUNTER — Encounter: Payer: Self-pay | Admitting: Podiatry

## 2013-12-11 ENCOUNTER — Ambulatory Visit (INDEPENDENT_AMBULATORY_CARE_PROVIDER_SITE_OTHER): Payer: Medicare Other | Admitting: Podiatry

## 2013-12-11 DIAGNOSIS — M069 Rheumatoid arthritis, unspecified: Secondary | ICD-10-CM

## 2013-12-11 DIAGNOSIS — M79609 Pain in unspecified limb: Secondary | ICD-10-CM

## 2013-12-11 DIAGNOSIS — M79676 Pain in unspecified toe(s): Secondary | ICD-10-CM

## 2013-12-11 DIAGNOSIS — B351 Tinea unguium: Secondary | ICD-10-CM

## 2013-12-11 DIAGNOSIS — Q828 Other specified congenital malformations of skin: Secondary | ICD-10-CM

## 2013-12-11 NOTE — Progress Notes (Signed)
She presents today with a chief complaint of painful elongated toenails and painful lesions plantar aspect of the bilateral foot secondary to rheumatoid arthritis.  Objective: Pulses are palpable bilateral. Severe hammertoe deformity and plantarflexed metatarsal resulting reactive hyperkeratosis. Nails are elongated and dystrophic.  Assessment: Pain in limb secondary to nail dystrophy and porokeratotic lesions plantar aspect the bilateral foot.  Plan: Debridement of all reactive hyperkeratosis and debridement of nails bilateral covered service secondary to pain.

## 2013-12-25 ENCOUNTER — Telehealth: Payer: Self-pay | Admitting: Radiation Oncology

## 2013-12-25 NOTE — Telephone Encounter (Signed)
Received call from patient today. She questions if she has to present for follow up since she already knows her CT results. Explained this writer would reach out to Dr. Tammi Klippel to see if follow up was necessary and call her back. She verbalized understanding.

## 2013-12-26 ENCOUNTER — Telehealth: Payer: Self-pay | Admitting: Radiation Oncology

## 2013-12-26 ENCOUNTER — Telehealth: Payer: Self-pay | Admitting: *Deleted

## 2013-12-26 ENCOUNTER — Other Ambulatory Visit: Payer: Self-pay | Admitting: Radiation Oncology

## 2013-12-26 DIAGNOSIS — C3491 Malignant neoplasm of unspecified part of right bronchus or lung: Secondary | ICD-10-CM

## 2013-12-26 NOTE — Telephone Encounter (Signed)
CALLED PATIENT TO INFORM OF TEST FOR 06-21-14 - ARRIVAL TIME - 3:45 PM @ [facility] AND HER FU VISIT WITH DR. MANNING ON 06-27-14 @ 4 PM, SPOKE WITH PATIENT AND SHE IS AWARE OF THESE APPTS.

## 2013-12-26 NOTE — Telephone Encounter (Signed)
We could repeat chest CT in 6 months then follow-up.

## 2013-12-26 NOTE — Telephone Encounter (Signed)
Phoned patient making her aware that Dr. Tammi Klippel is OK with her cancelling her follow up tomorrow so long as she has no complaints. Patient denies complaints. She expresses relief that she doesn't have to come tomorrow because she has no car and her daughter has to work. She understands Enid Derry will contact her later today with CT for six months out and follow up appointment. She request these appointment be scheduled for late afternoon so her daughter can bring her. She request a message be left with these appointments on her answering machine since she plans to go play bingo. Understands to call with future needs. Romie Jumper working to schedule CT and follow up.

## 2013-12-27 ENCOUNTER — Ambulatory Visit: Payer: Medicare Other | Admitting: Radiation Oncology

## 2014-03-12 ENCOUNTER — Ambulatory Visit: Payer: Medicare Other | Admitting: Podiatry

## 2014-04-16 ENCOUNTER — Ambulatory Visit (INDEPENDENT_AMBULATORY_CARE_PROVIDER_SITE_OTHER): Payer: Medicare Other | Admitting: Podiatry

## 2014-04-16 ENCOUNTER — Encounter: Payer: Self-pay | Admitting: Podiatry

## 2014-04-16 DIAGNOSIS — Q828 Other specified congenital malformations of skin: Secondary | ICD-10-CM

## 2014-04-16 DIAGNOSIS — M79676 Pain in unspecified toe(s): Secondary | ICD-10-CM

## 2014-04-16 DIAGNOSIS — B351 Tinea unguium: Secondary | ICD-10-CM

## 2014-04-16 NOTE — Progress Notes (Signed)
She presents today with a chief complaint of painful elongated toenails and painful lesions plantar aspect of the bilateral foot secondary to rheumatoid arthritis.  Objective: Pulses are palpable bilateral. Severe hammertoe deformity and plantarflexed metatarsal resulting reactive hyperkeratosis. Nails are elongated and dystrophic.  Assessment: Pain in limb secondary to nail dystrophy and porokeratotic lesions plantar aspect the bilateral foot.  Plan: Debridement of all reactive hyperkeratosis and debridement of nails bilateral covered service secondary to pain.

## 2014-06-17 ENCOUNTER — Ambulatory Visit: Payer: Medicare Other | Admitting: Podiatry

## 2014-06-21 ENCOUNTER — Ambulatory Visit (HOSPITAL_COMMUNITY): Payer: Medicare Other

## 2014-06-21 ENCOUNTER — Ambulatory Visit (HOSPITAL_COMMUNITY)
Admission: RE | Admit: 2014-06-21 | Discharge: 2014-06-21 | Disposition: A | Discharge status: Home / Self Care | Payer: Medicare Other | Source: Ambulatory Visit | Attending: Radiation Oncology | Admitting: Radiation Oncology

## 2014-06-21 DIAGNOSIS — C3491 Malignant neoplasm of unspecified part of right bronchus or lung: Secondary | ICD-10-CM | POA: Insufficient documentation

## 2014-06-24 ENCOUNTER — Telehealth: Payer: Self-pay | Admitting: Radiation Oncology

## 2014-06-24 NOTE — Telephone Encounter (Signed)
Phoned patient as ordered by Dr. Tammi Klippel and explained her chest CT shows no reccurence. Went on to explain her right sided pain could be related to previous right sided radiotherapy but, Dr. Tammi Klippel will discuss further at her follow up appt. Confirmed 3/10 1600 follow up.

## 2014-06-24 NOTE — Telephone Encounter (Signed)
-----   Message from Emily Paula, MD sent at 06/23/2014  4:26 PM EST ----- Sam,  Please let patient know, chest CT shows no recurrence.  Her right sided pain could be related to previous right-sided radiotherapy, and we can discuss at follow-up.  MM

## 2014-06-27 ENCOUNTER — Ambulatory Visit
Admission: RE | Admit: 2014-06-27 | Discharge: 2014-06-27 | Disposition: A | Discharge status: Home / Self Care | Payer: Medicare Other | Source: Ambulatory Visit | Attending: Radiation Oncology | Admitting: Radiation Oncology

## 2014-06-27 ENCOUNTER — Encounter: Payer: Self-pay | Admitting: Radiation Oncology

## 2014-06-27 VITALS — BP 136/50 | HR 75 | Temp 97.6°F | Resp 16 | Wt 118.5 lb

## 2014-06-27 DIAGNOSIS — C3411 Malignant neoplasm of upper lobe, right bronchus or lung: Secondary | ICD-10-CM

## 2014-06-27 NOTE — Progress Notes (Signed)
Patient here with granddaughter, [name]. Slow shuffling gait noted with assistance of rolling walker. Vitals stable. Weight gain noted. Reports sleeping and eating without complication. Denies cough, SOB or difficulty swallowing. Reports occasionally she feels a punch in her chest.

## 2014-06-27 NOTE — Progress Notes (Signed)
Radiation Oncology         [phone] ________________________________  Name: [name] MRN: [id number]  Date: 06/27/2014  DOB: November 08, 1929  Follow-Up Visit Note  CC: Mayra Neer, MD  Gaye Pollack, MD  Diagnosis:   81 year old woman with a 2.7 cm clinical stage IA squamous cell carcinoma of the right upper lung S/p Curative SBRT 05/02/2012, 05/04/2012, 05/09/2012 to 54 Gy in 3 fractions (She has left lower lung nodule also, which was negative on PET and has not been biopsied)    ICD-9-CM ICD-10-CM   1. Primary cancer of right upper lobe of lung 162.3 C34.11 CT Chest Wo Contrast    Interval Since Last Radiation:  2  years  Narrative:  The patient returns today for routine follow-up.  Patient here with granddaughter, Sophia. Slow shuffling gait noted with assistance of rolling walker. Vitals stable. Weight gain noted. Reports sleeping and eating without complication. Denies cough, SOB or difficulty swallowing. Reports occasionally she feels a punch in her chest                              ALLERGIES:  is allergic to latex; boniva; darvocet; erythromycin; morphine and related; oxycontin; sulfa antibiotics; tramadol; and vicodin.  Meds: Current Outpatient Prescriptions  Medication Sig Dispense Refill  . folic acid (FOLVITE) 1 MG tablet Take 1 tablet (1 mg total) by mouth daily.    . furosemide (LASIX) 20 MG tablet Take 20 mg by mouth daily as needed for fluid or edema.     . predniSONE (DELTASONE) 5 MG tablet     . sertraline (ZOLOFT) 50 MG tablet Take 50 mg by mouth daily.    . traZODone (DESYREL) 50 MG tablet Take 50 mg by mouth at bedtime.     No current facility-administered medications for this encounter.    Physical Findings: The patient is in no acute distress. Patient is alert and oriented.  weight is 118 lb 8 oz (53.751 kg). Her oral temperature is 97.6 F (36.4 C). Her blood pressure is 136/50 and her pulse is 75. Her respiration is 16 and oxygen saturation is  97%. . Patient localizes chest discomfort to the right upper anterior chest wall. No significant changes.  Lab Findings: Lab Results  Component Value Date   WBC 5.0 12/28/2012   HGB 8.4* 12/28/2012   HCT 24.5* 12/28/2012   PLT 49* 12/28/2012    Lab Results  Component Value Date   NA 134* 12/24/2012   K 3.6 12/24/2012   CO2 23 12/24/2012   GLUCOSE 94 12/24/2012   BUN 14.1 05/04/2013   BUN 17 12/24/2012   CREATININE 0.8 05/04/2013   CREATININE 0.81 12/24/2012   BILITOT 1.0 12/24/2012   ALKPHOS 42 12/24/2012   AST 18 12/24/2012   ALT 9 12/24/2012   PROT 5.2* 12/24/2012   ALBUMIN 2.4* 12/24/2012   CALCIUM 7.8* 12/24/2012    Radiographic Findings: Ct Chest Wo Contrast  06/21/2014   CLINICAL DATA:  Subsequent encounter for right lung cancer restaging. Two to three-week history of right-sided pain.  EXAM: CT CHEST WITHOUT CONTRAST  TECHNIQUE: Multidetector CT imaging of the chest was performed following the standard protocol without IV contrast.  COMPARISON:  11/07/2013.  FINDINGS: Mediastinum / Lymph Nodes: There is no axillary lymphadenopathy. No mediastinal lymphadenopathy. No bulky hilar lymphadenopathy evident on this uninfused exam. Thyroid gland is unremarkable. Calcified nodal tissue is seen in the right hilum and subcarinal station.  Heart size is normal. Coronary artery calcification is noted.  Lungs / Pleura: Bilateral changes of emphysema noted. Postsurgical scarring in the right apex is stable. 5 mm nodule on the medial aspect of the right major fissure is unchanged. 3 mm right lower lobe pulmonary nodule on image 24 series 5 is stable. 6 x 10 mm spiculated left lower lobe nodule seen previously is stable at 6 x 10 mm. Calcified granuloma is noted in the left lower lobe  MSK / Soft Tissues: Bone windows reveal no worrisome lytic or sclerotic osseous lesions. Thoracic scoliosis is evident.  Upper Abdomen: Visualized portions of the liver are unremarkable. Calcified granulomata  noted in the spleen. There is no adrenal nodule or mass.  IMPRESSION: Stable exam.  No new or progressive findings.  Stable postsurgical changes in the right hemi thorax. No evidence for local recurrent disease.  No change in the bilateral noncalcified pulmonary nodules.   Electronically Signed   By: Misty Stanley M.D.   On: 06/21/2014 15:41   (screenshot visible in EPIC of 3/4 CT image 19)   Given the patient's symptom of chest discomfort, I reviewed the images carefully for post stereotactic body radiotherapy rib fractures and was able to identify a fracture of the right second rib which was apparent on the current scan in the July 2015 scan. This explains her chest discomfort symptoms. Ribs 3, 4 and 5 along the right also appear to be closely approximated due to increasing fibrosis and intercostal space which may be contributing to chest wall pain.  Impression:  The patient is recovering from the effects of radiation.  She has no evidence of recurrence. She does have some chest wall toxicity from stereotactic body radiotherapy. This is a relatively common complication resulting in some localized pain. It can be amenable to Lyrica or Neurontin, or nerve block.  In some cases, chest wall discomfort following this. He is a temporary phenomenon. In other cases, it becomes more of a chronic site of discomfort similar to post thoracotomy pain.  Plan:  Today, talked to the patient that her CT scan. We talked about the post SBRT rib fracture and various management options. At this time, the patient does have some discomfort but does not wish to pursue any further interventions since it is not significantly influencing her quality of life. She will continue to monitor the pain and call if she desires to try either prescription or a possible intervention. We will repeat chest CT in 6 months and then follow-up with her. _____________________________________  Sheral Apley. Tammi Klippel, M.D.

## 2014-06-28 ENCOUNTER — Telehealth: Payer: Self-pay | Admitting: *Deleted

## 2014-06-28 NOTE — Telephone Encounter (Signed)
CALLED PATIENT TO INFORM OF CT AND FU, SPOKE WITH PATIENT AND SHE IS AWARE OF THESE APPTS.

## 2014-07-04 ENCOUNTER — Ambulatory Visit: Payer: Self-pay | Admitting: Radiation Oncology

## 2014-08-20 ENCOUNTER — Telehealth: Payer: Self-pay | Admitting: *Deleted

## 2014-08-20 NOTE — Telephone Encounter (Signed)
On 08-20-14 fax medical records to [facility], it was follow up note, ct chest report.

## 2014-12-24 ENCOUNTER — Encounter (HOSPITAL_COMMUNITY): Payer: Self-pay

## 2014-12-24 ENCOUNTER — Ambulatory Visit (HOSPITAL_COMMUNITY)
Admission: RE | Admit: 2014-12-24 | Discharge: 2014-12-24 | Disposition: A | Discharge status: Home / Self Care | Payer: Medicare Other | Source: Ambulatory Visit | Attending: Radiation Oncology | Admitting: Radiation Oncology

## 2014-12-24 DIAGNOSIS — R918 Other nonspecific abnormal finding of lung field: Secondary | ICD-10-CM | POA: Insufficient documentation

## 2014-12-24 DIAGNOSIS — I2510 Atherosclerotic heart disease of native coronary artery without angina pectoris: Secondary | ICD-10-CM | POA: Diagnosis not present

## 2014-12-24 DIAGNOSIS — Z08 Encounter for follow-up examination after completed treatment for malignant neoplasm: Secondary | ICD-10-CM | POA: Diagnosis not present

## 2014-12-24 DIAGNOSIS — Z9221 Personal history of antineoplastic chemotherapy: Secondary | ICD-10-CM | POA: Insufficient documentation

## 2014-12-24 DIAGNOSIS — C3411 Malignant neoplasm of upper lobe, right bronchus or lung: Secondary | ICD-10-CM | POA: Diagnosis present

## 2014-12-24 DIAGNOSIS — I7090 Unspecified atherosclerosis: Secondary | ICD-10-CM | POA: Diagnosis not present

## 2014-12-26 ENCOUNTER — Encounter: Payer: Self-pay | Admitting: Radiation Oncology

## 2014-12-26 ENCOUNTER — Ambulatory Visit
Admission: RE | Admit: 2014-12-26 | Discharge: 2014-12-26 | Disposition: A | Discharge status: Home / Self Care | Payer: Medicare Other | Source: Ambulatory Visit | Attending: Radiation Oncology | Admitting: Radiation Oncology

## 2014-12-26 VITALS — BP 161/57 | HR 75 | Resp 16 | Wt 113.0 lb

## 2014-12-26 DIAGNOSIS — C3411 Malignant neoplasm of upper lobe, right bronchus or lung: Secondary | ICD-10-CM

## 2014-12-26 NOTE — Progress Notes (Signed)
Denies pain. Slow shuffling gait noted. Weight loss of five pounds noted since 06/27/14. Denies cough, SOB or difficulty swallowing. Reports the pain she felt in her chest last visit has resolved. BP elevated.   BP 185/58 mmHg  Pulse 75  Resp 16  Wt 113 lb (51.256 kg)  SpO2 97% Wt Readings from Last 3 Encounters:  12/26/14 113 lb (51.256 kg)  06/27/14 118 lb 8 oz (53.751 kg)  05/10/13 96 lb (43.545 kg)

## 2014-12-26 NOTE — Progress Notes (Signed)
Radiation Oncology         [phone] ________________________________  Name: [name] MRN: [id number]  Date: 12/26/2014  DOB: 05-26-1929    Follow-Up Visit Note  CC: Mayra Neer, MD  Gaye Pollack, MD  Diagnosis:   81 year old woman with a 2.7 cm clinical stage IA squamous cell carcinoma of the right upper lung S/p Curative SBRT 05/02/2012, 05/04/2012, 05/09/2012 to 54 Gy in 3 fractions (She has left lower lung nodule also, which was negative on PET and has not been biopsied)    ICD-9-CM ICD-10-CM   1. Primary cancer of right upper lobe of lung 162.3 C34.11     Interval Since Last Radiation:  2.5  years  Narrative:  The patient returns today for routine follow-up.  Denies pain. Slow shuffling gait noted. Weight loss of five pounds noted since 06/27/14. Denies cough, SOB or difficulty swallowing. Reports the pain she felt in her chest last visit has resolved. BP elevated. Denies having a BP medication. Sees Dr. Brigitte Pulse for PCP once a year.                               ALLERGIES:  is allergic to latex; boniva; darvocet; erythromycin; morphine and related; oxycontin; sulfa antibiotics; tramadol; and vicodin.  Meds: Current Outpatient Prescriptions  Medication Sig Dispense Refill  . folic acid (FOLVITE) 1 MG tablet Take 1 tablet (1 mg total) by mouth daily.    . furosemide (LASIX) 20 MG tablet Take 20 mg by mouth daily as needed for fluid or edema.     . predniSONE (DELTASONE) 5 MG tablet     . sertraline (ZOLOFT) 50 MG tablet Take 50 mg by mouth daily.    . traZODone (DESYREL) 50 MG tablet Take 50 mg by mouth at bedtime.     No current facility-administered medications for this encounter.    Physical Findings: The patient is in no acute distress. Patient is alert and oriented.  weight is 113 lb (51.256 kg). Her blood pressure is 161/57 and her pulse is 75. Her respiration is 16 and oxygen saturation is 97%. .  No significant changes.  Lab Findings: Lab Results    Component Value Date   WBC 5.0 12/28/2012   HGB 8.4* 12/28/2012   HCT 24.5* 12/28/2012   PLT 49* 12/28/2012    Lab Results  Component Value Date   NA 134* 12/24/2012   K 3.6 12/24/2012   CO2 23 12/24/2012   GLUCOSE 94 12/24/2012   BUN 14.1 05/04/2013   BUN 17 12/24/2012   CREATININE 0.8 05/04/2013   CREATININE 0.81 12/24/2012   BILITOT 1.0 12/24/2012   ALKPHOS 42 12/24/2012   AST 18 12/24/2012   ALT 9 12/24/2012   PROT 5.2* 12/24/2012   ALBUMIN 2.4* 12/24/2012   CALCIUM 7.8* 12/24/2012    Radiographic Findings: Ct Chest Wo Contrast  12/24/2014   CLINICAL DATA:  81 year old female with history of lung cancer diagnosed in April 2013 status post radiation therapy, completed in 2014.  EXAM: CT CHEST WITHOUT CONTRAST  TECHNIQUE: Multidetector CT imaging of the chest was performed following the standard protocol without IV contrast.  COMPARISON:  Chest CT 06/21/2014.  FINDINGS: Mediastinum/Lymph Nodes: Heart size is normal. There is no significant pericardial fluid, thickening or pericardial calcification. There is atherosclerosis of the thoracic aorta, the great vessels of the mediastinum and the coronary arteries, including calcified atherosclerotic plaque in the left main, left anterior  descending, left circumflex and right coronary arteries. Calcifications of the aortic valve and mitral annulus. No pathologically enlarged mediastinal or hilar lymph nodes. Please note that accurate exclusion of hilar adenopathy is limited on noncontrast CT scans. Esophagus is unremarkable in appearance. No axillary lymphadenopathy.  Lungs/Pleura: Ground-glass attenuation nodule in the left lower lobe (image 26 of series 5) appears slightly larger than prior examinations, measuring 13 x 9 mm on today's study, but demonstrates no central solid component. 5 mm subpleural in the posterior aspect of the right upper lobe abutting the major fissure (image 15 of series 5) nodule is unchanged compared to the prior  examination. 4 mm ground-glass attenuation right lower lobe nodule (image 24 of series 5) appears similar to the prior examination. Calcified granuloma is in the left lower lobe are unchanged. No other new suspicious appearing pulmonary nodules or masses are otherwise noted. Extensive architectural distortion in the right upper lobe related to prior wedge resection and likely radiation therapy, similar to the prior examination. No acute consolidative airspace disease. No pleural effusions.  Upper Abdomen: Atherosclerosis. Numerous calcified granulomas in the spleen.  Musculoskeletal/Soft Tissues: There are no aggressive appearing lytic or blastic lesions noted in the visualized portions of the skeleton. Old fracture of the anterior aspect of the right second rib with chronic nonunion, unchanged.  IMPRESSION: 1. Multiple small pulmonary nodules scattered throughout the lungs bilaterally. These are generally similar to the prior examination, with exception of slight interval enlargement of the ground-glass attenuation nodule in the left lower lobe, which currently measures 13 x 9 mm. This has no central solid component at this time. Continued attention on future followup studies is recommended, with repeat chest CT recommended in 1 year at this time. 2. Atherosclerosis, including left main and 3 vessel coronary artery disease. 3. Chronic post treatment related changes in the right upper lobe, similar to prior examinations. 4. Additional incidental findings, as above.   Electronically Signed   By: Vinnie Langton M.D.   On: 12/24/2014 17:05   Impression:  The patient has no evidence of recurrence.  Her systolic BP is high today.  Plan:  Today, talked to the patient that her CT scan. Repeat CT scan in 6 months with follow up.  Will copy her PCP Dr. Brigitte Pulse regarding BP.  This document serves as a record of services personally performed by Tyler Pita, MD. It was created on his behalf by Arlyce Harman, a  trained medical scribe. The creation of this record is based on the scribe's personal observations and the provider's statements to them. This document has been checked and approved by the attending provider.    _____________________________________  Sheral Apley. Tammi Klippel, M.D.

## 2015-04-03 ENCOUNTER — Other Ambulatory Visit: Payer: Self-pay | Admitting: Radiation Oncology

## 2015-04-03 DIAGNOSIS — D381 Neoplasm of uncertain behavior of trachea, bronchus and lung: Secondary | ICD-10-CM

## 2015-04-04 ENCOUNTER — Telehealth: Payer: Self-pay | Admitting: *Deleted

## 2015-04-04 NOTE — Telephone Encounter (Signed)
CALLED PATIENT TO INFORM OF CT ON 06-25-15 AND HER FU VISIT WITH DR. MANNING ON 06-26-15 @ 11:15 AM, SPOKE WITH PATIENT AND SHE IS AWARE OF THIS APPT.

## 2015-05-16 ENCOUNTER — Emergency Department (HOSPITAL_COMMUNITY): Payer: Medicare Other

## 2015-05-16 ENCOUNTER — Encounter (HOSPITAL_COMMUNITY): Payer: Self-pay

## 2015-05-16 ENCOUNTER — Emergency Department (HOSPITAL_COMMUNITY)
Admission: EM | Admit: 2015-05-16 | Discharge: 2015-05-16 | Disposition: A | Discharge status: Home / Self Care | Payer: Medicare Other | Attending: Emergency Medicine | Admitting: Emergency Medicine

## 2015-05-16 DIAGNOSIS — Z8719 Personal history of other diseases of the digestive system: Secondary | ICD-10-CM | POA: Insufficient documentation

## 2015-05-16 DIAGNOSIS — M810 Age-related osteoporosis without current pathological fracture: Secondary | ICD-10-CM | POA: Diagnosis not present

## 2015-05-16 DIAGNOSIS — Z91040 Latex allergy status: Secondary | ICD-10-CM | POA: Insufficient documentation

## 2015-05-16 DIAGNOSIS — J440 Chronic obstructive pulmonary disease with acute lower respiratory infection: Secondary | ICD-10-CM | POA: Diagnosis not present

## 2015-05-16 DIAGNOSIS — Z87891 Personal history of nicotine dependence: Secondary | ICD-10-CM | POA: Diagnosis not present

## 2015-05-16 DIAGNOSIS — F419 Anxiety disorder, unspecified: Secondary | ICD-10-CM | POA: Insufficient documentation

## 2015-05-16 DIAGNOSIS — Z85118 Personal history of other malignant neoplasm of bronchus and lung: Secondary | ICD-10-CM | POA: Insufficient documentation

## 2015-05-16 DIAGNOSIS — Z923 Personal history of irradiation: Secondary | ICD-10-CM | POA: Diagnosis not present

## 2015-05-16 DIAGNOSIS — Z8639 Personal history of other endocrine, nutritional and metabolic disease: Secondary | ICD-10-CM | POA: Diagnosis not present

## 2015-05-16 DIAGNOSIS — Z79899 Other long term (current) drug therapy: Secondary | ICD-10-CM | POA: Insufficient documentation

## 2015-05-16 DIAGNOSIS — J029 Acute pharyngitis, unspecified: Secondary | ICD-10-CM | POA: Diagnosis present

## 2015-05-16 DIAGNOSIS — J209 Acute bronchitis, unspecified: Secondary | ICD-10-CM

## 2015-05-16 MED ORDER — ALBUTEROL SULFATE (2.5 MG/3ML) 0.083% IN NEBU
5.0000 mg | INHALATION_SOLUTION | Freq: Once | RESPIRATORY_TRACT | Status: AC
  Administered 2015-05-16: 5 mg via RESPIRATORY_TRACT
  Filled 2015-05-16: qty 6

## 2015-05-16 MED ORDER — BENZONATATE 100 MG PO CAPS: 100.0000 mg | ORAL_CAPSULE | Freq: Three times a day (TID) | ORAL | Status: DC | PRN

## 2015-05-16 MED ORDER — AMOXICILLIN-POT CLAVULANATE 875-125 MG PO TABS: 1.0000 | ORAL_TABLET | Freq: Two times a day (BID) | ORAL | Status: DC

## 2015-05-16 MED ORDER — ACETAMINOPHEN 325 MG PO TABS
650.0000 mg | ORAL_TABLET | Freq: Once | ORAL | Status: AC
  Administered 2015-05-16: 650 mg via ORAL
  Filled 2015-05-16: qty 2

## 2015-05-16 MED ORDER — PREDNISONE 20 MG PO TABS: 40.0000 mg | ORAL_TABLET | Freq: Every day | ORAL | Status: DC

## 2015-05-16 MED ORDER — METHYLPREDNISOLONE SODIUM SUCC 125 MG IJ SOLR
125.0000 mg | Freq: Once | INTRAMUSCULAR | Status: AC
  Administered 2015-05-16: 125 mg via INTRAMUSCULAR
  Filled 2015-05-16: qty 2

## 2015-05-16 MED ORDER — LEVOFLOXACIN IN D5W 500 MG/100ML IV SOLN
500.0000 mg | Freq: Once | INTRAVENOUS | Status: AC
  Administered 2015-05-16: 500 mg via INTRAVENOUS
  Filled 2015-05-16: qty 100

## 2015-05-16 MED ORDER — ALBUTEROL SULFATE HFA 108 (90 BASE) MCG/ACT IN AERS
1.0000 | INHALATION_SPRAY | RESPIRATORY_TRACT | Status: DC | PRN
  Administered 2015-05-16: 2 via RESPIRATORY_TRACT
  Filled 2015-05-16: qty 6.7

## 2015-05-16 MED ORDER — AMOXICILLIN-POT CLAVULANATE 875-125 MG PO TABS
1.0000 | ORAL_TABLET | Freq: Once | ORAL | Status: AC
  Administered 2015-05-16: 1 via ORAL
  Filled 2015-05-16: qty 1

## 2015-05-16 NOTE — ED Notes (Signed)
Pt with sore throat since Monday. Went to [facility] on Monday and given script for mouthwash.  Problem continues.  No fever.  Congestion with cough also.

## 2015-05-16 NOTE — ED Provider Notes (Addendum)
CSN: [id number]     Arrival date & time 05/16/15  1158 History   First MD Initiated Contact with Patient 05/16/15 1241     Chief Complaint  Patient presents with  . Sore Throat  . Cough     (Consider location/radiation/quality/duration/timing/severity/associated sxs/prior Treatment) HPI Patient presents with scratchy throat and chest congestion with cough starting on Monday. Was seen at urgent care facility and given mouthwash. States she's been coughing up green and yellow mucous. She's had shaking chills and diaphoresis. Patient also has had increasing shortness of breath. No known sick contacts. She is not oxygen at home and states at times she feels she needs to use an inhaler. Denies any lower extremity swelling or pain. Past Medical History  Diagnosis Date  . Hip pain, right   . Osteoporosis   . RA (rheumatoid arthritis) ([place])   . PAC (premature atrial contraction)   . Shingles   . HLD (hyperlipidemia)   . Hernia, umbilical   . COPD (chronic obstructive pulmonary disease) ([place])   . Lung mass   . History of radiation therapy 05/02/12-05/04/12,&05/09/12    rul lung 54Gy/22f  . Cancer [place]     lung ca   History reviewed. No pertinent past surgical history. History reviewed. No pertinent family history. Social History  Substance Use Topics  . Smoking status: Former Smoker    Types: Cigarettes    Quit date: 01/11/2010  . Smokeless tobacco: Never Used  . Alcohol Use: No   OB History    No data available     Review of Systems  Constitutional: Positive for chills and diaphoresis. Negative for fever.  HENT: Positive for congestion, sore throat and voice change. Negative for sinus pressure and trouble swallowing.   Respiratory: Positive for cough, shortness of breath and wheezing. Negative for chest tightness.   Cardiovascular: Negative for chest pain, palpitations and leg swelling.  Gastrointestinal: Negative for nausea, abdominal pain and diarrhea.  Musculoskeletal:  Negative for myalgias, back pain, neck pain and neck stiffness.  Skin: Negative for rash and wound.  Neurological: Negative for dizziness, weakness, light-headedness, numbness and headaches.  All other systems reviewed and are negative.     Allergies  Latex; Boniva; Darvocet; Erythromycin; Morphine and related; Oxycontin; Sulfa antibiotics; Tramadol; and Vicodin  Home Medications   Prior to Admission medications   Medication Sig Start Date End Date Taking? Authorizing Provider  Cholecalciferol (VITAMIN D-3) 1000 units CAPS Take 2 capsules by mouth daily.   Yes Historical Provider, MD  folic acid (FOLVITE) 1 MG tablet Take 1 tablet (1 mg total) by mouth daily. 12/28/12  Yes Marianne L York, PA-C  Multiple Vitamins-Minerals (MULTIVITAMIN & MINERAL PO) Take 1 tablet by mouth daily.   Yes Historical Provider, MD  amoxicillin-clavulanate (AUGMENTIN) 875-125 MG tablet Take 1 tablet by mouth 2 (two) times daily. One po bid x 7 days 05/16/15   DJulianne Rice MD  benzonatate (TESSALON) 100 MG capsule Take 1 capsule (100 mg total) by mouth 3 (three) times daily as needed for cough. 05/16/15   DJulianne Rice MD  predniSONE (DELTASONE) 20 MG tablet Take 2 tablets (40 mg total) by mouth daily. 05/16/15   DJulianne Rice MD   BP 131/64 mmHg  Pulse 106  Temp(Src) 99.2 F (37.3 C) (Oral)  Resp 20  SpO2 98% Physical Exam  Constitutional: She is oriented to person, place, and time. She appears well-developed and well-nourished. No distress.  Speaks with raspy voice  HENT:  Head: Normocephalic and atraumatic.  Mouth/Throat: Oropharynx is clear and moist. No oropharyngeal exudate.  Eyes: EOM are normal. Pupils are equal, round, and reactive to light.  Neck: Normal range of motion. Neck supple. No JVD present.  Cardiovascular: Normal rate and regular rhythm.  Exam reveals no gallop and no friction rub.   No murmur heard. Pulmonary/Chest: Effort normal. No stridor. No respiratory distress. She has  wheezes (scattered expiratory wheezes, increased work of breathing). She has no rales. She exhibits no tenderness.  Abdominal: Soft. Bowel sounds are normal. She exhibits no distension and no mass. There is no tenderness. There is no rebound and no guarding.  Musculoskeletal: Normal range of motion. She exhibits no edema or tenderness.  No lower extremity swelling or pain.  Neurological: She is alert and oriented to person, place, and time.  Moves all extremities without deficit. Sensation is fully intact.  Skin: Skin is warm and dry. No rash noted. No erythema.  Psychiatric: She has a normal mood and affect. Her behavior is normal.  Nursing note and vitals reviewed.   ED Course  Procedures (including critical care time) Labs Review Labs Reviewed - No data to display  Imaging Review Dg Neck Soft Tissue  05/16/2015  CLINICAL DATA:  Shortness of breath approximately 20-30 minutes ago. Cough. Initial encounter. EXAM: NECK SOFT TISSUES - 1+ VIEW COMPARISON:  None. FINDINGS: There is no evidence of retropharyngeal soft tissue swelling or epiglottic enlargement. The cervical airway is unremarkable and no radio-opaque foreign body is identified. Marked multilevel cervical spondylosis and convex right scoliosis are noted. IMPRESSION: No acute abnormality. Cervical scoliosis and spondylosis. Electronically Signed   By: Inge Rise M.D.   On: 05/16/2015 15:10   Dg Chest 2 View  05/16/2015  CLINICAL DATA:  Productive cough, right side chest pain for 4 days EXAM: CHEST  2 VIEW COMPARISON:  12/24/2014 FINDINGS: Cardiomediastinal silhouette is stable. Again noted scarring and postsurgical changes in right upper lobe. Bilateral apical pleural parenchymal scarring again noted. No definite superimposed infiltrate or pulmonary edema. Osteopenia and degenerative changes thoracic spine. IMPRESSION: Again noted scarring and postsurgical changes in right upper lobe. Bilateral apical pleural parenchymal scarring  again noted. No definite superimposed infiltrate or pulmonary edema. Electronically Signed   By: Lahoma Crocker M.D.   On: 05/16/2015 12:43   I have personally reviewed and evaluated these images and lab results as part of my medical decision-making.   EKG Interpretation None      MDM   Final diagnoses:  Bronchitis with bronchospasm   Patient had breathing treatment and then initiation of antibiotic. Patient started having choking sensation stating she was having trouble breathing. Denied any rash or itching. Antibiotics was stopped. Breath sounds are clear throughout. No stridor noted. No intraoral swelling noted. Patient initially was very anxious and is calm down. She states she is breathing better. Question laryngospasm versus anxiety.      Julianne Rice, MD 05/16/15 1525   Patient is breathing much better. She is in no distress. Speaks in a clear voice. Given dose of Augmentin and we'll discharge home with albuterol inhaler. She is advised to follow-up with her primary physician. She's been given return precautions and is voiced understanding.  Julianne Rice, MD 05/16/15 [phone]

## 2015-05-16 NOTE — ED Notes (Signed)
Patient's daughter came out of patient's room and yelled for a nurse - this RN to bedside.  Patient coughing and stated multiple times that she could not breathe for episode lasting approx 24mn.  Patient was placed on continuous pulse oximetry and maintained oxygen saturation of 93-95% during entire episode.  Pt also able to speak in complete sentences and requested to see MD.  Patient coughed small amount of thick yellow mucus.  Dr. YLita Mainsto bedside to assess patient.  Patient assisted back into bed and placed on 1L oxygen via nasal cannula for comfort.  No other complaints during episode.

## 2015-05-16 NOTE — Discharge Instructions (Signed)
Acute Bronchitis Bronchitis is inflammation of the airways that extend from the windpipe into the lungs (bronchi). The inflammation often causes mucus to develop. This leads to a cough, which is the most common symptom of bronchitis.  In acute bronchitis, the condition usually develops suddenly and goes away over time, usually in a couple weeks. Smoking, allergies, and asthma can make bronchitis worse. Repeated episodes of bronchitis may cause further lung problems.  CAUSES Acute bronchitis is most often caused by the same virus that causes a cold. The virus can spread from person to person (contagious) through coughing, sneezing, and touching contaminated objects. SIGNS AND SYMPTOMS   Cough.   Fever.   Coughing up mucus.   Body aches.   Chest congestion.   Chills.   Shortness of breath.   Sore throat.  DIAGNOSIS  Acute bronchitis is usually diagnosed through a physical exam. Your health care provider will also ask you questions about your medical history. Tests, such as chest X-rays, are sometimes done to rule out other conditions.  TREATMENT  Acute bronchitis usually goes away in a couple weeks. Oftentimes, no medical treatment is necessary. Medicines are sometimes given for relief of fever or cough. Antibiotic medicines are usually not needed but may be prescribed in certain situations. In some cases, an inhaler may be recommended to help reduce shortness of breath and control the cough. A cool mist vaporizer may also be used to help thin bronchial secretions and make it easier to clear the chest.  HOME CARE INSTRUCTIONS  Get plenty of rest.   Drink enough fluids to keep your urine clear or pale yellow (unless you have a medical condition that requires fluid restriction). Increasing fluids may help thin your respiratory secretions (sputum) and reduce chest congestion, and it will prevent dehydration.   Take medicines only as directed by your health care provider.  If  you were prescribed an antibiotic medicine, finish it all even if you start to feel better.  Avoid smoking and secondhand smoke. Exposure to cigarette smoke or irritating chemicals will make bronchitis worse. If you are a smoker, consider using nicotine gum or skin patches to help control withdrawal symptoms. Quitting smoking will help your lungs heal faster.   Reduce the chances of another bout of acute bronchitis by washing your hands frequently, avoiding people with cold symptoms, and trying not to touch your hands to your mouth, nose, or eyes.   Keep all follow-up visits as directed by your health care provider.  SEEK MEDICAL CARE IF: Your symptoms do not improve after 1 week of treatment.  SEEK IMMEDIATE MEDICAL CARE IF:  You develop an increased fever or chills.   You have chest pain.   You have severe shortness of breath.  You have bloody sputum.   You develop dehydration.  You faint or repeatedly feel like you are going to pass out.  You develop repeated vomiting.  You develop a severe headache. MAKE SURE YOU:   Understand these instructions.  Will watch your condition.  Will get help right away if you are not doing well or get worse.   This information is not intended to replace advice given to you by your health care provider. Make sure you discuss any questions you have with your health care provider.   Document Released: 05/13/2004 Document Revised: 04/26/2014 Document Reviewed: 09/26/2012 Elsevier Interactive Patient Education 2016 [employer] Bronchospasm, Adult A bronchospasm is a spasm or tightening of the airways going into the lungs. During a bronchospasm  breathing becomes more difficult because the airways get smaller. When this happens there can be coughing, a whistling sound when breathing (wheezing), and difficulty breathing. Bronchospasm is often associated with asthma, but not all patients who experience a bronchospasm have asthma. CAUSES  A  bronchospasm is caused by inflammation or irritation of the airways. The inflammation or irritation may be triggered by:   Allergies (such as to animals, pollen, food, or mold). Allergens that cause bronchospasm may cause wheezing immediately after exposure or many hours later.   Infection. Viral infections are believed to be the most common cause of bronchospasm.   Exercise.   Irritants (such as pollution, cigarette smoke, strong odors, aerosol sprays, and paint fumes).   Weather changes. Winds increase molds and pollens in the air. Rain refreshes the air by washing irritants out. Cold air may cause inflammation.   Stress and emotional upset.  SIGNS AND SYMPTOMS   Wheezing.   Excessive nighttime coughing.   Frequent or severe coughing with a simple cold.   Chest tightness.   Shortness of breath.  DIAGNOSIS  Bronchospasm is usually diagnosed through a history and physical exam. Tests, such as chest X-rays, are sometimes done to look for other conditions. TREATMENT   Inhaled medicines can be given to open up your airways and help you breathe. The medicines can be given using either an inhaler or a nebulizer machine.  Corticosteroid medicines may be given for severe bronchospasm, usually when it is associated with asthma. HOME CARE INSTRUCTIONS   Always have a plan prepared for seeking medical care. Know when to call your health care provider and local emergency services (911 in the [place] Know where you can access local emergency care.  Only take medicines as directed by your health care provider.  If you were prescribed an inhaler or nebulizer machine, ask your health care provider to explain how to use it correctly. Always use a spacer with your inhaler if you were given one.  It is necessary to remain calm during an attack. Try to relax and breathe more slowly.  Control your home environment in the following ways:   Change your heating and air conditioning  filter at least once a month.   Limit your use of fireplaces and wood stoves.  Do not smoke and do not allow smoking in your home.   Avoid exposure to perfumes and fragrances.   Get rid of pests (such as roaches and mice) and their droppings.   Throw away plants if you see mold on them.   Keep your house clean and dust free.   Replace carpet with wood, tile, or vinyl flooring. Carpet can trap dander and dust.   Use allergy-proof pillows, mattress covers, and box spring covers.   Wash bed sheets and blankets every week in hot water and dry them in a dryer.   Use blankets that are made of polyester or cotton.   Wash hands frequently. SEEK MEDICAL CARE IF:   You have muscle aches.   You have chest pain.   The sputum changes from clear or white to yellow, green, gray, or bloody.   The sputum you cough up gets thicker.   There are problems that may be related to the medicine you are given, such as a rash, itching, swelling, or trouble breathing.  SEEK IMMEDIATE MEDICAL CARE IF:   You have worsening wheezing and coughing even after taking your prescribed medicines.   You have increased difficulty breathing.   You develop severe  chest pain. MAKE SURE YOU:   Understand these instructions.  Will watch your condition.  Will get help right away if you are not doing well or get worse.   This information is not intended to replace advice given to you by your health care provider. Make sure you discuss any questions you have with your health care provider.   Document Released: 04/08/2003 Document Revised: 04/26/2014 Document Reviewed: 09/25/2012 Elsevier Interactive Patient Education [employer].

## 2015-06-25 ENCOUNTER — Telehealth: Payer: Self-pay | Admitting: Radiation Oncology

## 2015-06-25 ENCOUNTER — Ambulatory Visit (HOSPITAL_COMMUNITY)
Admission: RE | Admit: 2015-06-25 | Discharge: 2015-06-25 | Disposition: A | Discharge status: Home / Self Care | Payer: Medicare Other | Source: Ambulatory Visit | Attending: Radiation Oncology | Admitting: Radiation Oncology

## 2015-06-25 DIAGNOSIS — R918 Other nonspecific abnormal finding of lung field: Secondary | ICD-10-CM | POA: Insufficient documentation

## 2015-06-25 DIAGNOSIS — D381 Neoplasm of uncertain behavior of trachea, bronchus and lung: Secondary | ICD-10-CM

## 2015-06-25 NOTE — Progress Notes (Signed)
  Radiation Oncology         (336) [phone] ________________________________  Name: [name] MRN: [id number]  Date: 06/25/2015  DOB: 1929-09-24  Chart Note:  I reviewed this patient's most recent findings and wanted to take a minute to document my impression.  Her latest Chest CT shows increasing density in the treated lung potentially reflecting fibrosis versus recurrence.  We will arrange PET at the recommendation of radiology to assess further.  Imaging suggests that the increasing density appears superimposed on previously treated target.  CURRENT SCAN:     TREATED TUMOR (2014):      ________________________________  Sheral Apley. Tammi Klippel, M.D.

## 2015-06-25 NOTE — Telephone Encounter (Signed)
Cassandra of [facility] left message requesting return call. Called her back. She provided a verbal report of this patient's ct chest impression. Informed Dr. Tammi Klippel of these findings. Patient will be seen for follow up tomorrow to review these results.

## 2015-06-26 ENCOUNTER — Ambulatory Visit
Admission: RE | Admit: 2015-06-26 | Discharge: 2015-06-26 | Disposition: A | Discharge status: Home / Self Care | Payer: Medicare Other | Source: Ambulatory Visit | Attending: Radiation Oncology | Admitting: Radiation Oncology

## 2015-06-26 ENCOUNTER — Encounter: Payer: Self-pay | Admitting: Radiation Oncology

## 2015-06-26 VITALS — BP 190/78 | HR 65 | Temp 98.0°F | Ht 59.0 in | Wt 110.9 lb

## 2015-06-26 DIAGNOSIS — C3411 Malignant neoplasm of upper lobe, right bronchus or lung: Secondary | ICD-10-CM

## 2015-06-26 NOTE — Progress Notes (Addendum)
[name] presents for follow up of radiation completed 05/09/2012 to her right upper lung. She is concerned about how high her blood pressure is today which was 190/78. It was taken multiple times per her request. The first two times it was 202/84. She reports in the Emergency Room in January it was normal. She is not on any blood pressure medicines. She denies pain. She reports she is eating well and her weight has been stable. She denies any cough or shortness of breath.   BP 190/78 mmHg  Pulse 65  Temp(Src) 98 F (36.7 C)  Ht '4\' 11"'$  (1.499 m)  Wt 110 lb 14.4 oz (50.304 kg)  BMI 22.39 kg/m2  SpO2 99%   Wt Readings from Last 3 Encounters:  06/26/15 110 lb 14.4 oz (50.304 kg)  12/26/14 113 lb (51.256 kg)  06/27/14 118 lb 8 oz (53.751 kg)

## 2015-06-26 NOTE — Progress Notes (Signed)
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 Primary cancer of right upper lobe of lung (HCC) 162.3 C34.11     Interval Since Last Radiation:  3  years  Narrative:  [name] presents for follow up of SBRT completed 05/09/2012 to her right upper lung.She did undergo a CT scan of the chest on 06/25/2015. Postoperative changes in the right upper lobe from previous wedge resection were noted, and throughout the region there is architectural distortion which in comparison to previous assessment reveals a more masslike change since her previous scan in September 2016. This area currently measures up to 5 2 x 4 cm, and stable 9 x 12 mm some solid nodules in the left lower lobe is unchanged. Focal area of architectural distortion with groundglass attenuation and septal thickening in the anterior left upper lobe was also persistent and stable. Chronic scarring with linear opacities bilaterally were also again noted, and no evidence of effusion was present. She comes to discuss this today.    On review of systems, the patient reports that she is doing well overall. She states that she is not experiencing any cough, shortness of breath, chest pain, fevers, chills, night sweats or unintended weight changes. She reports that she is concerned about how high her blood pressure was when he was checked today.  She reports in the Emergency Room in January 2017 it was normal when she was seen for an allergic reaction. She denies any abdominal pain,  nausea, vomiting, bowel or bladder dysfunction. A complete review of systems is obtained and is otherwise negative.                              ALLERGIES:  is allergic to latex; boniva; darvocet; erythromycin; morphine and related; oxycontin; sulfa antibiotics; tramadol; and vicodin.  Meds: Current Outpatient Prescriptions  Medication Sig Dispense Refill  . Cholecalciferol (VITAMIN D-3) 1000 units CAPS Take 2 capsules by mouth daily.    . folic acid (FOLVITE) 1 MG tablet Take 1 tablet (1 mg total) by mouth daily.    . Multiple Vitamins-Minerals (MULTIVITAMIN & MINERAL PO) Take 1 tablet by mouth daily.    . predniSONE (DELTASONE) 20 MG tablet Take 2 tablets (40 mg total) by mouth daily. 8 tablet 0  . benzonatate (TESSALON) 100 MG capsule Take 1 capsule (100 mg total) by mouth 3 (three) times daily as needed for cough. (Patient not taking: Reported on 06/26/2015) 21 capsule 0   No current facility-administered medications for this encounter.    Physical Findings:  height is '4\' 11"'$  (1.499 m) and weight is 110 lb 14.4 oz (50.304 kg). Her temperature is 98 F (36.7 C). Her blood pressure is 190/78 and her pulse is 65. Her oxygen saturation is 99%.   Pain scale 0/10 In general this is a well appearing Caucasian female in no acute distress. She's alert and oriented x4 and appropriate throughout the  examination.  HEENT reveals that the patient is normocephalic, atraumatic. Skin is intact without any evidence of gross lesions. Cardiovascular exam reveals a regular rate and rhythm, no clicks rubs or murmurs are auscultated. Chest is clear to auscultation bilaterally. Lymphatic assessment is performed and does not reveal any adenopathy in the cervical, supraclavicular, axillary, or inguinal chains.   Lab Findings: Lab Results  Component Value Date   WBC 5.0 12/28/2012   HGB 8.4* 12/28/2012   HCT 24.5* 12/28/2012   PLT 49* 12/28/2012    Lab Results  Component Value Date   NA 134* 12/24/2012    K 3.6 12/24/2012   CO2 23 12/24/2012   GLUCOSE 94 12/24/2012   BUN 14.1 05/04/2013   BUN 17 12/24/2012   CREATININE 0.8 05/04/2013   CREATININE 0.81 12/24/2012   BILITOT 1.0 12/24/2012   ALKPHOS 42 12/24/2012   AST 18 12/24/2012   ALT 9 12/24/2012   PROT 5.2* 12/24/2012   ALBUMIN 2.4* 12/24/2012   CALCIUM 7.8* 12/24/2012    Radiographic Findings: Ct Chest Wo Contrast  06/25/2015  CLINICAL DATA:  82 year old female with history of lung cancer diagnosed in October 2013. Radiation therapy completed in January 2014. Routine follow-up examination. EXAM: CT CHEST WITHOUT CONTRAST TECHNIQUE: Multidetector CT imaging of the chest was performed following the standard protocol without IV contrast. COMPARISON:  Chest CT 12/24/2014. FINDINGS: Mediastinum/Lymph Nodes: Heart size is normal. There is no significant pericardial fluid, thickening or pericardial calcification. There is atherosclerosis of the thoracic aorta, the great vessels of the mediastinum and the coronary arteries, including calcified atherosclerotic plaque in the left main, left anterior descending and right coronary arteries. Calcifications of the mitral annulus. Mild calcifications of the aortic valve. No pathologically enlarged mediastinal or hilar lymph nodes. Please note that accurate exclusion of hilar adenopathy is limited on noncontrast CT scans. Esophagus is unremarkable in appearance. No axillary lymphadenopathy. Lungs/Pleura: Postoperative changes in the right upper lobe from prior wedge resection. Throughout this region there is architectural distortion. When compared to multiple prior examinations, this area of architectural distortion has progressively become more mass-like in appearance, currently measuring up to 4.0 x 5.2 cm. Stable 9 x 12 mm sub solid nodule in the left lower lobe is unchanged over numerous prior examinations. Focal area of architectural distortion with ground-glass attenuation and septal thickening in the  anterior left upper lobe (image 23 of series 5), also similar to numerous prior examinations, favored to represent an area of post infectious or inflammatory scarring. Several linear opacities are noted throughout the lung bases bilaterally, compatible with areas of chronic scarring. Pleural calcifications are noted in the lower left hemithorax, similar to prior studies. No acute consolidative airspace disease. No pleural effusions. Upper Abdomen: Extensive atherosclerosis. Numerous calcified granulomas in the spleen. Musculoskeletal/Soft Tissues: There are no aggressive appearing lytic or blastic lesions noted in the visualized portions of the skeleton. IMPRESSION: 1. Progressively increasing mass-like enlargement of the area of architectural distortion in the right upper lobe at site of prior surgery and radiation therapy. While it is possible that this could simply represent progressive postradiation mass-like fibrosis, local recurrence of disease is strongly suspected, and further evaluation with repeat PET-CT is recommended in the near future. 2. Other chronic findings, similar prior examinations, as detailed above. These results will be called to the ordering clinician or representative by the Radiologist Assistant, and communication documented in the PACS or zVision Dashboard. Electronically Signed   By: Vinnie Langton M.D.   On: 06/25/2015 12:10  Impression:  82 year old female with previous SBRT treatment for right upper lobe lung cancer, with CT scan features concerning for scarring of the lung parenchyma versus recurrent disease   Plan:  A PET scan has been ordered in recommended to delineate whether or not the findings on her CT scan represent active disease versus posttreatment related scarring. We will plan to meet back following her imaging studies to discuss the results and disposition for her care. She states agreement and understanding.   The above documentation reflects my direct findings  during this shared patient visit. Please see the separate note by Dr. Tammi Klippel on this date for the remainder of the patient's plan of care.  Carola Rhine, PAC   This document serves as a record of services personally performed by Shona Simpson, PAC and Tyler Pita, MD. It was created on their behalf by Arlyce Harman, a trained medical scribe. The creation of this record is based on the scribe's personal observations and the provider's statements to them. This document has been checked and approved by the attending provider.

## 2015-08-27 ENCOUNTER — Other Ambulatory Visit: Payer: Self-pay | Admitting: Radiation Oncology

## 2015-08-27 DIAGNOSIS — C3411 Malignant neoplasm of upper lobe, right bronchus or lung: Secondary | ICD-10-CM

## 2015-08-28 ENCOUNTER — Telehealth: Payer: Self-pay | Admitting: *Deleted

## 2015-08-28 NOTE — Telephone Encounter (Signed)
Called patient to inform that Pet Scan has been moved to 09-10-15, lvm for a return call

## 2015-09-05 ENCOUNTER — Ambulatory Visit (HOSPITAL_COMMUNITY): Payer: Medicare Other

## 2015-09-10 ENCOUNTER — Ambulatory Visit (HOSPITAL_COMMUNITY)
Admission: RE | Admit: 2015-09-10 | Discharge: 2015-09-10 | Disposition: A | Discharge status: Home / Self Care | Payer: Medicare Other | Source: Ambulatory Visit | Attending: Radiation Oncology | Admitting: Radiation Oncology

## 2015-09-10 DIAGNOSIS — I2510 Atherosclerotic heart disease of native coronary artery without angina pectoris: Secondary | ICD-10-CM | POA: Insufficient documentation

## 2015-09-10 DIAGNOSIS — C3411 Malignant neoplasm of upper lobe, right bronchus or lung: Secondary | ICD-10-CM | POA: Insufficient documentation

## 2015-09-10 LAB — GLUCOSE, CAPILLARY: Glucose-Capillary: 99 mg/dL (ref 65–99)

## 2015-09-10 MED ORDER — FLUDEOXYGLUCOSE F - 18 (FDG) INJECTION
5.9200 | Freq: Once | INTRAVENOUS | Status: AC | PRN
  Administered 2015-09-10: 5.92 via INTRAVENOUS

## 2015-09-11 ENCOUNTER — Encounter: Payer: Self-pay | Admitting: Radiation Oncology

## 2015-09-11 ENCOUNTER — Ambulatory Visit
Admission: RE | Admit: 2015-09-11 | Discharge: 2015-09-11 | Disposition: A | Discharge status: Home / Self Care | Payer: Medicare Other | Source: Ambulatory Visit | Attending: Radiation Oncology | Admitting: Radiation Oncology

## 2015-09-11 VITALS — BP 131/46 | HR 77 | Temp 98.1°F | Ht 59.0 in | Wt 109.7 lb

## 2015-09-11 DIAGNOSIS — E785 Hyperlipidemia, unspecified: Secondary | ICD-10-CM | POA: Insufficient documentation

## 2015-09-11 DIAGNOSIS — I491 Atrial premature depolarization: Secondary | ICD-10-CM | POA: Diagnosis not present

## 2015-09-11 DIAGNOSIS — Z87891 Personal history of nicotine dependence: Secondary | ICD-10-CM | POA: Insufficient documentation

## 2015-09-11 DIAGNOSIS — Z923 Personal history of irradiation: Secondary | ICD-10-CM | POA: Insufficient documentation

## 2015-09-11 DIAGNOSIS — C3411 Malignant neoplasm of upper lobe, right bronchus or lung: Secondary | ICD-10-CM | POA: Diagnosis not present

## 2015-09-11 DIAGNOSIS — M069 Rheumatoid arthritis, unspecified: Secondary | ICD-10-CM | POA: Diagnosis not present

## 2015-09-11 DIAGNOSIS — J449 Chronic obstructive pulmonary disease, unspecified: Secondary | ICD-10-CM | POA: Diagnosis not present

## 2015-09-11 NOTE — Progress Notes (Signed)
[name] here for reassessment S/P XRT to her right upper lung.  She denies any SOB when ambulating nor when sleeping.  Denies any pain. Here today for results of her recent PET Scan.    BP 131/46 mmHg  Pulse 77  Temp(Src) 98.1 F (36.7 C)  Ht '4\' 11"'$  (1.499 m)  Wt 109 lb 11.2 oz (49.76 kg)  BMI 22.15 kg/m2  SpO2 99%   Wt Readings from Last 3 Encounters:  09/11/15 109 lb 11.2 oz (49.76 kg)  06/26/15 110 lb 14.4 oz (50.304 kg)  12/26/14 113 lb (51.256 kg)

## 2015-09-11 NOTE — Progress Notes (Signed)
Radiation Oncology         [phone]) [phone] ________________________________  Name: [name] MRN: [id number]  Date: 09/11/2015  DOB: 1929/11/30    Follow-Up Visit Note  CC: Emily Neer, MD  Emily Pollack, MD  Diagnosis: 82 y.o. woman with a 2.7 cm clinical stage IA squamous cell carcinoma of the right upper lung (She has left lower lung nodule also, which was negative on PET and has not been biopsied)    ICD-9-CM ICD-10-CM   1. Primary cancer of right upper lobe of lung (HCC) 162.3 C34.11     Interval Since Last Radiation: 3 years and 4 months  05/02/2012, 05/04/2012, 05/09/2012: The tumor was treated to 54 Gy in 3 fractions of 18 Gy.  Narrative:  The patient returns today for routine follow-up. The patient had a PET scan on 09/10/15 to delineate whether or not the findings on her CT scan from 06/25/15 is post-treatment related scarring or recurrent disease. The PET scan noted a right axillary lymph node measuring 10 mm (SUV 3.2), postoperative changes and surrounding consolidation in the right upper lobe measuring 2.7 x 3.5 cm (SUV 4.2), a 5 x 8 mm nodule in the apical segment right upper lobe (SUV 2.3), and a stable 8 x 12 mm nodule in the left lower lobe demonstrating no abnormal hypermetabolism.  ROS: On review of systems, the patient reports that she is doing well overall. She denies any chest pain, shortness of breath, cough, fevers, chills, night sweats, unintended weight changes. She denies any bowel or bladder disturbances, and denies abdominal pain, nausea or vomiting. She denies any new musculoskeletal or joint aches or pains. A complete review of systems is obtained and is otherwise negative.  Past Medical History:  Past Medical History  Diagnosis Date  . Hip pain, right   . Osteoporosis   . RA (rheumatoid arthritis) ([place])   . PAC (premature atrial contraction)   . Shingles   . HLD (hyperlipidemia)   . Hernia, umbilical   . COPD (chronic obstructive pulmonary  disease) ([place])   . Lung mass   . History of radiation therapy 05/02/12-05/04/12,&05/09/12    rul lung 54Gy/59f  . Cancer ([facility]     lung ca    Past Surgical History:No past surgical history on file.  Social History:  Social History   Social History  . Marital Status: Single    Spouse Name: N/A  . Number of Children: N/A  . Years of Education: N/A   Occupational History  . retired    Social History Main Topics  . Smoking status: Former Smoker    Types: Cigarettes    Quit date: 01/11/2010  . Smokeless tobacco: Never Used  . Alcohol Use: No  . Drug Use: No  . Sexual Activity: Not on file   Other Topics Concern  . Not on file   Social History Narrative   [facility]    ALLERGIES:  is allergic to latex; boniva; darvocet; erythromycin; morphine and related; oxycontin; sulfa antibiotics; tramadol; and vicodin.  Meds: Current Outpatient Prescriptions  Medication Sig Dispense Refill  . Cholecalciferol (VITAMIN D-3) 1000 units CAPS Take 2 capsules by mouth daily.    . folic acid (FOLVITE) 1 MG tablet Take 1 tablet (1 mg total) by mouth daily.    . Multiple Vitamins-Minerals (MULTIVITAMIN & MINERAL PO) Take 1 tablet by mouth daily.    . predniSONE (DELTASONE) 5 MG tablet Take  5 mg by mouth every morning.  1   No current facility-administered medications for this encounter.    Physical Findings:  height is '4\' 11"'$  (1.499 m) and weight is 109 lb 11.2 oz (49.76 kg). Her temperature is 98.1 F (36.7 C). Her blood pressure is 131/46 and her pulse is 77. Her oxygen saturation is 99%.   In general this is a elderly, well appearing Caucasian female in no acute distress. She's alert and oriented x4 and appropriate throughout the examination. Cardiopulmonary assessment is negative for acute distress and she exhibits normal effort.    Lab Findings: Lab Results  Component Value Date   WBC 5.0 12/28/2012   HGB 8.4*  12/28/2012   HCT 24.5* 12/28/2012   PLT 49* 12/28/2012    Lab Results  Component Value Date   NA 134* 12/24/2012   K 3.6 12/24/2012   CO2 23 12/24/2012   GLUCOSE 94 12/24/2012   BUN 14.1 05/04/2013   BUN 17 12/24/2012   CREATININE 0.8 05/04/2013   CREATININE 0.81 12/24/2012   BILITOT 1.0 12/24/2012   ALKPHOS 42 12/24/2012   AST 18 12/24/2012   ALT 9 12/24/2012   PROT 5.2* 12/24/2012   ALBUMIN 2.4* 12/24/2012   CALCIUM 7.8* 12/24/2012    Radiographic Findings: Nm Pet Image Initial (pi) Skull Base To Thigh  09/10/2015  CLINICAL DATA:  Initial treatment strategy for lung cancer. EXAM: NUCLEAR MEDICINE PET SKULL BASE TO THIGH TECHNIQUE: 5.9 mCi F-18 FDG was injected intravenously. Full-ring PET imaging was performed from the skull base to thigh after the radiotracer. CT data was obtained and used for attenuation correction and anatomic localization. FASTING BLOOD GLUCOSE:  Value: 99 mg/dl COMPARISON:  CT chest 06/25/2015 and 12/24/2014.  PET 04/03/2012. FINDINGS: NECK No hypermetabolic lymph nodes in the neck. CT images show no acute findings. Retention cysts or polyps in the maxillary sinuses. CHEST Right axillary lymph node measures 10 mm and has an SUV max of 3.2. Postoperative changes and surrounding consolidation in the right upper lobe collectively measure 2.7 x 3.5 c, with an SUV max of 4.2. A 7 mm (5 x 8 mm) nodule in the apical segment right upper lobe (CT image 55, series 5) has an SUV max of 2.3. No hypermetabolic mediastinal lymph nodes. 10 mm (8 x 12 mm) nodule in the left lower lobe (series 5, image 70) does not show abnormal hypermetabolism and is stable from 07/13/2012. No pericardial or pleural effusion. Coronary artery calcification. ABDOMEN/PELVIS No abnormal hypermetabolism in the liver, adrenal glands, spleen or pancreas. No hypermetabolic lymph nodes. CT images show the liver, gallbladder, adrenal glands, kidneys, spleen, pancreas, stomach and bowel to be grossly  unremarkable. Atherosclerotic calcification of the arterial vasculature without abdominal aortic aneurysm. Ventral hernias contain fat or unobstructed bowel. Uterus is coarsely calcified, as on prior exams. Right inguinal hernia contains fat. No free fluid. SKELETON No abnormal osseous hypermetabolism. Degenerative changes are seen in the spine. IMPRESSION: 1. Hypermetabolism in the right upper lobe, associated with soft tissue proliferation at the surgical site is highly worrisome for disease recurrence. 2. Mildly hypermetabolic separate nodule in the apical segment right upper lobe. Nodule does appear to be stable in size from 04/03/2012, however. 3. Coronary artery calcification. Electronically Signed   By: Lorin Picket M.D.   On: 09/10/2015 14:20   Impression:  1. Stage Ia, squamous cell carcinoma of the right upper lobe lung cancer. After review of the recent PET scan by Dr. Tammi Klippel, it appears that the consolidation  in the right upper lobe could to be attributed to radiation changes and not a recurrence of disease. We discussed the limitations of relying on imaging studies in this manner, and to prove this, a biopsy would be required. The patient is not interested in pursuing aggressive forms of working, and would like to undergo repeat imaging. She is in agreement to proceed with a repeat CT scan in 3 months time. She has however encouraged to call should any questions or concerns that arise prior to that visit. _____________________________________  Carola Rhine, PAC  This document serves as a record of services personally performed by Shona Simpson, PA-C. It was created on her behalf by Darcus Austin, a trained medical scribe. The creation of this record is based on the scribe's personal observations and the provider's statements to them. This document has been checked and approved by the attending provider.

## 2015-09-12 ENCOUNTER — Other Ambulatory Visit: Payer: Self-pay | Admitting: Radiation Oncology

## 2015-09-12 DIAGNOSIS — C3411 Malignant neoplasm of upper lobe, right bronchus or lung: Secondary | ICD-10-CM

## 2015-09-18 NOTE — Addendum Note (Signed)
Encounter addended by: Benn Moulder, RN on: 09/18/2015 11:11 AM<BR>     Documentation filed: Charges VN

## 2015-12-08 ENCOUNTER — Telehealth: Payer: Self-pay | Admitting: *Deleted

## 2015-12-08 NOTE — Telephone Encounter (Addendum)
Returned call from Ms. Valen Slabaugh's daughter, Lenn Sink in regards to her mothers prior PET Scan in 09/10/15.  She stated she could not hear my prior message from last week due to issues with her voicemail. She is asking if her mother had any involved lymph nodes in the prior scan report.  Informed her that the scan states that her mother has a stable node which has not changed in sized since 2013 which , as she states, eased her anxiety. She questioned the need for another biopsy and this RN gave her information from Ms. Alberta FU appt from. 09/10/15 stating that "The patient is not interested in pursuing aggressive forms of working, and would like to undergo repeat imaging and she is in agreement to proceed with a repeat CT scan in 3 months time" which is scheduled for 12/17/15 and she stated understanding her her mother's wisches  Ms. Maxey is also concerned about the "long" timeframe" for her mother's next FU visit on 01/06/16 to receive the results of her 8/30/CT scan.

## 2015-12-08 NOTE — Telephone Encounter (Signed)
Ms. Heinzelman daughter called at ~ 11am and she accepted the FU appointment for 12/26/15 at 1 pm.  Encouraged to call if she needs to change the appointment date or if she has any other questions or concerns.

## 2015-12-08 NOTE — Telephone Encounter (Signed)
[name], You can offer her an appt with me at 1pm on Friday 9/8 if she is interested instead of having to wait so long. It should be a 15 min appt. Thanks, Bryson Ha

## 2015-12-17 ENCOUNTER — Ambulatory Visit (HOSPITAL_COMMUNITY)
Admission: RE | Admit: 2015-12-17 | Discharge: 2015-12-17 | Disposition: A | Discharge status: Home / Self Care | Payer: Medicare Other | Source: Ambulatory Visit | Attending: Radiation Oncology | Admitting: Radiation Oncology

## 2015-12-17 ENCOUNTER — Other Ambulatory Visit: Payer: Self-pay | Admitting: Radiation Oncology

## 2015-12-17 ENCOUNTER — Encounter (HOSPITAL_COMMUNITY): Payer: Self-pay

## 2015-12-17 ENCOUNTER — Ambulatory Visit
Admission: RE | Admit: 2015-12-17 | Discharge: 2015-12-17 | Disposition: A | Discharge status: Home / Self Care | Payer: Medicare Other | Source: Ambulatory Visit | Attending: Radiation Oncology | Admitting: Radiation Oncology

## 2015-12-17 DIAGNOSIS — C3411 Malignant neoplasm of upper lobe, right bronchus or lung: Secondary | ICD-10-CM

## 2015-12-17 DIAGNOSIS — I2510 Atherosclerotic heart disease of native coronary artery without angina pectoris: Secondary | ICD-10-CM | POA: Insufficient documentation

## 2015-12-17 DIAGNOSIS — I700 Atherosclerosis of aorta: Secondary | ICD-10-CM | POA: Diagnosis not present

## 2015-12-17 LAB — BUN AND CREATININE (CC13)
BUN: 22.8 mg/dL (ref 7.0–26.0)
Creatinine: 1 mg/dL (ref 0.6–1.1)
EGFR: 51 mL/min/{1.73_m2} — ABNORMAL LOW (ref >90)

## 2015-12-17 MED ORDER — IOPAMIDOL (ISOVUE-300) INJECTION 61%
75.0000 mL | Freq: Once | INTRAVENOUS | Status: AC | PRN
  Administered 2015-12-17: 75 mL via INTRAVENOUS

## 2015-12-18 ENCOUNTER — Ambulatory Visit: Payer: Self-pay | Admitting: Radiation Oncology

## 2015-12-25 ENCOUNTER — Telehealth: Payer: Self-pay | Admitting: Radiation Oncology

## 2015-12-25 DIAGNOSIS — C3411 Malignant neoplasm of upper lobe, right bronchus or lung: Secondary | ICD-10-CM

## 2015-12-25 NOTE — Telephone Encounter (Signed)
I spoke with Ms. Lemarr daughter to review the results from her mother's CT of the chest, and Dr. Johny Shears recommendations to move forward with repeat CT in 3 months. We will coordinate this and her follow up thereafter.

## 2016-01-06 ENCOUNTER — Ambulatory Visit: Payer: Self-pay | Admitting: Radiation Oncology

## 2016-02-09 ENCOUNTER — Encounter: Payer: Self-pay | Admitting: Podiatry

## 2016-02-09 ENCOUNTER — Other Ambulatory Visit: Payer: Self-pay | Admitting: *Deleted

## 2016-02-09 ENCOUNTER — Ambulatory Visit (INDEPENDENT_AMBULATORY_CARE_PROVIDER_SITE_OTHER): Payer: Medicare Other | Admitting: Podiatry

## 2016-02-09 DIAGNOSIS — M79609 Pain in unspecified limb: Secondary | ICD-10-CM

## 2016-02-09 DIAGNOSIS — L603 Nail dystrophy: Secondary | ICD-10-CM

## 2016-02-09 DIAGNOSIS — L608 Other nail disorders: Secondary | ICD-10-CM

## 2016-02-09 DIAGNOSIS — B351 Tinea unguium: Secondary | ICD-10-CM | POA: Diagnosis not present

## 2016-02-22 NOTE — Progress Notes (Signed)
SUBJECTIVE Patient  presents to office today complaining of elongated, thickened nails. Pain while ambulating in shoes. Patient is unable to trim their own nails.   OBJECTIVE General Patient is awake, alert, and oriented x 3 and in no acute distress. Derm Skin is dry and supple bilateral. Negative open lesions or macerations. Remaining integument unremarkable. Nails are tender, long, thickened and dystrophic with subungual debris, consistent with onychomycosis, 1-5 bilateral. No signs of infection noted. Vasc  DP and PT pedal pulses palpable bilaterally. Temperature gradient within normal limits.  Neuro Epicritic and protective threshold sensation diminished bilaterally.  Musculoskeletal Exam No symptomatic pedal deformities noted bilateral. Muscular strength within normal limits.  ASSESSMENT 1. Onychodystrophic nails 1-5 bilateral with hyperkeratosis of nails.  2. Onychomycosis of nail due to dermatophyte bilateral 3. Pain in foot bilateral  PLAN OF CARE 1. Patient evaluated today.  2. Instructed to maintain good pedal hygiene and foot care.  3. Mechanical debridement of nails 1-5 bilaterally performed using a nail nipper. Filed with dremel without incident.  4. Return to clinic in 3 mos.    Edrick Kins, DPM

## 2016-02-27 ENCOUNTER — Telehealth: Payer: Self-pay | Admitting: Rheumatology

## 2016-02-27 NOTE — Telephone Encounter (Signed)
Opened in error

## 2016-03-01 ENCOUNTER — Telehealth: Payer: Self-pay | Admitting: *Deleted

## 2016-03-01 NOTE — Telephone Encounter (Signed)
CALLED PATIENT TO INFORM OF STAT LABS ON 03-24-16 AND HER CT ON 03-24-16 (NPO 4 HRS. PRIOR TO TEST ) @ [facility] AND HER RETURN VISIT ON 03-25-16 @ 1:30 PM WITH ALISON PERKINS, SPOKE WITH PATIENT AND SHE IS AWARE OF THESE APPTS.

## 2016-03-23 NOTE — Progress Notes (Signed)
   [name] is here for a follow up appointment for Stage IA squamous cell carcinoma of the right upper lung, review CT chest w contrast from 03-24-16.  Weight: She reports a constant weight that hasn't changed recently.  Wt Readings from Last 3 Encounters:  03/25/16 105 lb (47.6 kg)  09/11/15 109 lb 11.2 oz (49.8 kg)  06/26/15 110 lb 14.4 oz (50.3 kg)   Respiratory issues: She denies.  Swallowing issues/ swallowing pain: She denies. Appetite: She reports a good appetite, just unable to gain weight.  Pain: She denies  BP (!) 149/76   Pulse 71   Temp 98.9 F (37.2 C)   Ht '4\' 11"'$  (1.499 m)   Wt 105 lb (47.6 kg)   SpO2 96% Comment: room air  BMI 21.21 kg/m

## 2016-03-24 ENCOUNTER — Ambulatory Visit: Payer: Medicare Other

## 2016-03-24 ENCOUNTER — Ambulatory Visit (HOSPITAL_COMMUNITY): Payer: Medicare Other

## 2016-03-24 ENCOUNTER — Ambulatory Visit (HOSPITAL_COMMUNITY)
Admission: RE | Admit: 2016-03-24 | Discharge: 2016-03-24 | Disposition: A | Discharge status: Home / Self Care | Payer: Medicare Other | Source: Ambulatory Visit | Attending: Radiation Oncology | Admitting: Radiation Oncology

## 2016-03-24 ENCOUNTER — Ambulatory Visit
Admission: RE | Admit: 2016-03-24 | Discharge: 2016-03-24 | Disposition: A | Discharge status: Home / Self Care | Payer: Medicare Other | Source: Ambulatory Visit | Attending: Radiation Oncology | Admitting: Radiation Oncology

## 2016-03-24 DIAGNOSIS — C3411 Malignant neoplasm of upper lobe, right bronchus or lung: Secondary | ICD-10-CM | POA: Diagnosis not present

## 2016-03-24 DIAGNOSIS — R918 Other nonspecific abnormal finding of lung field: Secondary | ICD-10-CM | POA: Insufficient documentation

## 2016-03-24 LAB — BASIC METABOLIC PANEL
Anion Gap: 10 mEq/L (ref 3–11)
BUN: 22.5 mg/dL (ref 7.0–26.0)
CO2: 25 mEq/L (ref 22–29)
Calcium: 9.2 mg/dL (ref 8.4–10.4)
Chloride: 107 mEq/L (ref 98–109)
Creatinine: 1.1 mg/dL (ref 0.6–1.1)
EGFR: 45 mL/min/{1.73_m2} — ABNORMAL LOW (ref >90)
Glucose: 101 mg/dl (ref 70–140)
Potassium: 4.3 mEq/L (ref 3.5–5.1)
Sodium: 142 mEq/L (ref 136–145)

## 2016-03-24 MED ORDER — IOPAMIDOL (ISOVUE-300) INJECTION 61%
75.0000 mL | Freq: Once | INTRAVENOUS | Status: AC | PRN
Start: 2016-03-24 — End: 2016-03-24
  Administered 2016-03-24: 75 mL via INTRAVENOUS

## 2016-03-25 ENCOUNTER — Ambulatory Visit
Admission: RE | Admit: 2016-03-25 | Discharge: 2016-03-25 | Disposition: A | Discharge status: Home / Self Care | Payer: Medicare Other | Source: Ambulatory Visit | Attending: Radiation Oncology | Admitting: Radiation Oncology

## 2016-03-25 ENCOUNTER — Other Ambulatory Visit: Payer: Self-pay | Admitting: Radiation Oncology

## 2016-03-25 ENCOUNTER — Encounter: Payer: Self-pay | Admitting: Radiation Oncology

## 2016-03-25 DIAGNOSIS — C3411 Malignant neoplasm of upper lobe, right bronchus or lung: Secondary | ICD-10-CM | POA: Insufficient documentation

## 2016-03-25 DIAGNOSIS — Z79899 Other long term (current) drug therapy: Secondary | ICD-10-CM | POA: Insufficient documentation

## 2016-03-25 DIAGNOSIS — Z87891 Personal history of nicotine dependence: Secondary | ICD-10-CM | POA: Insufficient documentation

## 2016-03-25 NOTE — Progress Notes (Signed)
Radiation Oncology         [phone] ________________________________  Name: [name] MRN: [id number]  Date: 03/25/2016  DOB: April 27, 1929    Follow-Up Visit Note  CC: Mayra Neer, MD  Gaye Pollack, MD  Diagnosis: 82 y.o. woman with a 2.7 cm clinical stage IA squamous cell carcinoma of the right upper lung (She has left lower lung nodule also, which was negative on PET and has not been biopsied)    ICD-9-CM ICD-10-CM   1. Primary cancer of right upper lobe of lung (HCC) 162.3 C34.11     Interval Since Last Radiation: 3 years and 11 months  05/02/2012, 05/04/2012, 05/09/2012: The tumor was treated to 54 Gy in 3 fractions of 18 Gy.  Narrative:  The patient returns today for routine follow-up. In summary she was treated for a stage I lung cancer in the 1990s. She was found to have a PET positive lesion in 2014 and this was biopsied consistent with SCC of the right upper lobe. She received SBRT, and has been since followed in surveillance. PET was performed  on 09/10/15 to delineate whether or not the findings on her CT scan from 06/25/15 is post-treatment related scarring or recurrent disease. The PET scan noted a right axillary lymph node measuring 10 mm (SUV 3.2), postoperative changes and surrounding consolidation in the right upper lobe measuring 2.7 x 3.5 cm (SUV 4.2), a 5 x 8 mm nodule in the apical segment right upper lobe (SUV 2.3), and a stable 8 x 12 mm nodule in the left lower lobe demonstrating no abnormal hypermetabolism. She had repeat CT in August and on 03/22/16 and interval increase in size was noted in August, but findings were stable on her scan this week. She comes to review the findings.  On review of systems, the patient reports that she is doing well overall. She denies any chest pain, shortness of breath, cough, fevers, chills, night sweats, unintended weight changes. She denies any bowel or bladder disturbances, and denies abdominal pain, nausea or vomiting. She  denies any new musculoskeletal or joint aches or pains. A complete review of systems is obtained and is otherwise negative.  Past Medical History:  Past Medical History:  Diagnosis Date  . Cancer ([place])    lung ca  . COPD (chronic obstructive pulmonary disease) ([place])   . Hernia, umbilical   . Hip pain, right   . History of radiation therapy 05/02/12-05/04/12,&05/09/12   rul lung 54Gy/87f  . HLD (hyperlipidemia)   . Lung mass   . Osteoporosis   . PAC (premature atrial contraction)   . [place]   . Shingles     Past Surgical History: Past Surgical History:  Procedure Laterality Date  . right lobectomy      Social History:  Social History   Social History  . Marital status: Single    Spouse name: N/A  . Number of children: N/A  . Years of education: N/A   Occupational History  . retired Retired   Social History Main Topics  . Smoking status: Former Smoker    Types: Cigarettes    Quit date: 01/11/2010  . Smokeless tobacco: Never Used  . Alcohol use No  . Drug use: No  . Sexual activity: Not on file   Other Topics Concern  . Not on file   Social History Narrative   [facility]   Family History:  Family History  Problem Relation Age of Onset  .  Cancer Daughter       ALLERGIES:  is allergic to latex; boniva [ibandronic acid]; darvocet [propoxyphene n-acetaminophen]; erythromycin; morphine and related; oxycontin [oxycodone hcl]; sulfa antibiotics; tramadol; and vicodin [hydrocodone-acetaminophen].  Meds: Current Outpatient Prescriptions  Medication Sig Dispense Refill  . Cholecalciferol (VITAMIN D-3) 1000 units CAPS Take 2 capsules by mouth daily.    . folic acid (FOLVITE) 1 MG tablet Take 1 tablet (1 mg total) by mouth daily.    . Multiple Vitamins-Minerals (MULTIVITAMIN & MINERAL PO) Take 1 tablet by mouth daily.    . predniSONE (DELTASONE) 5 MG tablet Take 5 mg by mouth every morning.  1   No current  facility-administered medications for this encounter.     Physical Findings:  height is '4\' 11"'$  (1.499 m) and weight is 105 lb (47.6 kg). Her temperature is 98.9 F (37.2 C). Her blood pressure is 149/76 (abnormal) and her pulse is 71. Her oxygen saturation is 96%.   In general this is a elderly, well appearing Caucasian female in no acute distress. She's alert and oriented x4 and appropriate throughout the examination. Cardiopulmonary assessment is negative for acute distress and she exhibits normal effort.    Lab Findings: Lab Results  Component Value Date   WBC 5.0 12/28/2012   HGB 8.4 (L) 12/28/2012   HCT 24.5 (L) 12/28/2012   PLT 49 (L) 12/28/2012    Lab Results  Component Value Date   NA 142 03/24/2016   K 4.3 03/24/2016   CHLORIDE 107 03/24/2016   CO2 25 03/24/2016   GLUCOSE 101 03/24/2016   BUN 22.5 03/24/2016   CREATININE 1.1 03/24/2016   BILITOT 1.0 12/24/2012   ALKPHOS 42 12/24/2012   AST 18 12/24/2012   ALT 9 12/24/2012   PROT 5.2 (L) 12/24/2012   ALBUMIN 2.4 (L) 12/24/2012   CALCIUM 9.2 03/24/2016   ANIONGAP 10 03/24/2016    Radiographic Findings: Ct Chest W Contrast  Result Date: 03/24/2016 CLINICAL DATA:  Right upper lobe lung cancer, diagnosed 01/2012, status post radiation EXAM: CT CHEST WITH CONTRAST TECHNIQUE: Multidetector CT imaging of the chest was performed during intravenous contrast administration. CONTRAST:  67m ISOVUE-300 IOPAMIDOL (ISOVUE-300) INJECTION 61% COMPARISON:  CT chest dated 12/17/2015.  PET-CT dated 09/10/2015 FINDINGS: Cardiovascular: Heart is normal in size.  No pericardial effusion. Atherosclerotic calcifications of the aortic arch. Coronary atherosclerosis in the LAD and right coronary artery. Mediastinum/Nodes: No suspicious mediastinal lymphadenopathy. 8 mm short axis right axillary node (series 2/ image 44), grossly unchanged. Visualized thyroid is unremarkable. Lungs/Pleura: Postprocedural changes in the right upper lobe. Masslike  consolidation in the right upper lobe, measuring 3.6 x 4.4 cm, previously 3.7 x 3.6 cm. Given the associated hypermetabolism on prior PET, this appearance is highly suspicious for recurrence. 10 mm nodule in the left lower lobe (series 7/image 70), unchanged. 8 mm nodule in the medial right upper lobe (series 7/image 37), previously 7 mm, grossly unchanged. Partially calcified granuloma in the left lower lobe (series 7/ image 108). Additional calcified granuloma at the left lung base (series 7/ image 119). No focal consolidation. No pleural effusion or pneumothorax. Upper Abdomen: Visualized upper abdomen is grossly unremarkable. Musculoskeletal: Degenerative changes of the thoracic spine. IMPRESSION: Mildly progressive mass-like consolidation in the right upper lobe, highly suspicious for recurrence. Additional small bilateral pulmonary nodules are grossly unchanged. Electronically Signed   By: SJulian HyM.D.   On: 03/24/2016 17:02   Impression:  1. Stage Ia, squamous cell carcinoma of the right upper lobe lung cancer.  The patient's films were compared by Dr. Tammi Klippel, and reviewed with Dr. Barbie Banner in interventional radiology. At this point, there is some questionable change noted in the size of the lesion seen in the right upper lobe along the site of her previous treatment. She did have PET CT in May 2017 and it had a lower SUV than would be anticipated in a recurrent setting along a site of previous treatment. Hence, we discussed options to proceed with tissue sampling, which Dr. Barbie Banner could performe or by pulmonary. She would rather avoid pulmonary evaluation of this, and will consider her options, but at the conclusion of our visit, the patinet and her daughter would rather pursue another repeat scan in 3 months to compare the size of the questionable changes. I discussed that if the patient would rather move forward with biopsy she could just call and we could coordinate this. Otherwise, I will plan  repeat scan in 3 months' time with follow up to review. She states agreement and understanding.  In a visit lasting 25 minutes, greater than 50% of the time was spent face to face discussing the patient's scans reviewing the images, and coordinating the patient's care.    Carola Rhine, PAC

## 2016-04-23 ENCOUNTER — Other Ambulatory Visit: Payer: Self-pay | Admitting: *Deleted

## 2016-04-23 MED ORDER — PREDNISONE 5 MG PO TABS: 5.0000 mg | ORAL_TABLET | Freq: Every day | ORAL | 1 refills | Status: DC

## 2016-04-23 NOTE — Telephone Encounter (Signed)
Refill request received via fax for Prednisone  Last Visit: 12/30/15 Next Visit due February/ March 2018. Message sent to the front to schedule patient.   Okay to refill Prednisone?

## 2016-06-09 DIAGNOSIS — M19042 Primary osteoarthritis, left hand: Secondary | ICD-10-CM | POA: Insufficient documentation

## 2016-06-09 DIAGNOSIS — M0630 Rheumatoid nodule, unspecified site: Secondary | ICD-10-CM | POA: Insufficient documentation

## 2016-06-09 DIAGNOSIS — Z79899 Other long term (current) drug therapy: Secondary | ICD-10-CM | POA: Insufficient documentation

## 2016-06-09 DIAGNOSIS — M19071 Primary osteoarthritis, right ankle and foot: Secondary | ICD-10-CM | POA: Insufficient documentation

## 2016-06-09 DIAGNOSIS — M19072 Primary osteoarthritis, left ankle and foot: Secondary | ICD-10-CM | POA: Insufficient documentation

## 2016-06-09 DIAGNOSIS — Z85118 Personal history of other malignant neoplasm of bronchus and lung: Secondary | ICD-10-CM | POA: Insufficient documentation

## 2016-06-09 DIAGNOSIS — M19041 Primary osteoarthritis, right hand: Secondary | ICD-10-CM | POA: Insufficient documentation

## 2016-06-09 NOTE — Progress Notes (Signed)
Office Visit Note  Patient: [name]             Date of Birth: 26-Oct-1929           MRN: [id number]             PCP: Mayra Neer, MD Referring: Mayra Neer, MD Visit Date: 06/22/2016 Occupation: '@GUAROCC'$ @    Subjective:  Pain hands.   History of Present Illness: [name] is a 83 y.o. female with history of rheumatoid arthritis. She states she is doing fairly well without much swelling. She is still taking prednisone 5 mg a day. She did not want to take any DMARD's and is quite satisfied with the current regimen.  Activities of Daily Living:  Patient reports morning stiffness for 0 minute.   Patient Denies nocturnal pain.  Difficulty dressing/grooming: Reports Difficulty climbing stairs: Reports Difficulty getting out of chair: Reports Difficulty using hands for taps, buttons, cutlery, and/or writing: Reports   Review of Systems  Constitutional: Positive for fatigue. Negative for night sweats, weight gain, weight loss and weakness.  HENT: Negative for mouth sores, trouble swallowing, trouble swallowing, mouth dryness and nose dryness.   Eyes: Negative for pain, redness, visual disturbance and dryness.  Respiratory: Negative for cough, shortness of breath and difficulty breathing.   Cardiovascular: Negative for chest pain, palpitations, hypertension, irregular heartbeat and swelling in legs/feet.  Gastrointestinal: Negative for blood in stool, constipation and diarrhea.  Genitourinary: Positive for nocturia. Negative for vaginal dryness.  Musculoskeletal: Positive for arthralgias and joint pain. Negative for joint swelling, myalgias, muscle weakness, morning stiffness, muscle tenderness and myalgias.  Skin: Negative for color change, rash, hair loss, skin tightness, ulcers and sensitivity to sunlight.  Allergic/Immunologic: Negative for susceptible to infections.  Neurological: Negative for dizziness, memory loss and night sweats.  Hematological: Negative for  swollen glands.  Psychiatric/Behavioral: Negative for depressed mood and sleep disturbance. The patient is not nervous/anxious.     PMFS History:  Patient Active Problem List   Diagnosis Date Noted  . 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 Primary osteoarthritis of both hands 06/09/2016  . Primary osteoarthritis of both feet 06/09/2016  . History of lung cancer 06/09/2016  . High risk medication use 06/09/2016  . Aortic calcification ([place]) 05/10/2013  . Coronary artery calcification 05/10/2013  . Protein-calorie malnutrition, severe ([place]) 12/25/2012  . Depression 12/25/2012  . Anemia of chronic disease 12/24/2012  . Thrombocytopenia, unspecified 12/24/2012  . Aphthous stomatitis 12/24/2012  . Bleeding from nasopharynx 12/24/2012  . Cellulitis 12/23/2012  . Hemoptysis? Secondary to either irritation by dentures, vs. secondary to lung neoplasm 12/23/2012  . Rheumatoid arthritis-severe 12/23/2012  . COPD, mild ([place]) 12/23/2012  . Osteoporosis 12/23/2012  . Primary cancer of right upper lobe of lung ([place]) 03/21/2012    Past Medical History:  Diagnosis Date  . Cancer ([place])    lung ca  . COPD (chronic obstructive pulmonary disease) ([place])   . Hernia, umbilical   . Hip pain, right   . History of radiation therapy 05/02/12-05/04/12,&05/09/12   rul lung 54Gy/21f  . HLD (hyperlipidemia)   . Lung mass   . Osteoporosis   . PAC (premature atrial contraction)   . RA (rheumatoid arthritis) ([place]   . Shingles     Family History  Problem Relation Age of Onset  . Cancer Daughter    Past Surgical History:  Procedure Laterality Date  . right lobectomy     Social History   Social History Narrative   Lives in ALF  at [facility]     Objective: Vital Signs: BP 124/78   Pulse 78   Resp 14   Ht '4\' 11"'$  (1.499 m)   Wt 104 lb (47.2 kg)   BMI 21.01 kg/m    Physical Exam  Constitutional: She is oriented to person, place, and time. She appears well-developed and well-nourished.    HENT:  Head: Normocephalic and atraumatic.  Eyes: Conjunctivae and EOM are normal.  Neck: Normal range of motion.  Cardiovascular: Normal rate, regular rhythm, normal heart sounds and intact distal pulses.   Pulmonary/Chest: Effort normal and breath sounds normal.  Abdominal: Soft. Bowel sounds are normal.  Lymphadenopathy:    She has no cervical adenopathy.  Neurological: She is alert and oriented to person, place, and time.  Skin: Skin is warm and dry. Capillary refill takes less than 2 seconds.  Psychiatric: She has a normal mood and affect. Her behavior is normal.  Nursing note and vitals reviewed.    Musculoskeletal Exam: C-spine and thoracic lumbar spine good range of motion she has some thoracic kyphosis. Shoulder joints elbow joints are good range of motion she has limited range of motion of bilateral wrist joints she has subluxation and ulnar deviation of all of her MCP joints she has synovitis and some of her MCPs and PIPs as described below. Hip joints knee joints with good range of motion. She did not take off her shoes for me to examine her feet.  CDAI Exam: CDAI Homunculus Exam:   Tenderness:  Right hand: 2nd MCP and 3rd PIP Left hand: 4th MCP  Swelling:  Right hand: 2nd MCP and 3rd PIP Left hand: 4th MCP  Joint Counts:  CDAI Tender Joint count: 3 CDAI Swollen Joint count: 3  Global Assessments:  Patient Global Assessment: 2 Provider Global Assessment: 4  CDAI Calculated Score: 12    Investigation: Findings:  03/06/2013 /We obtained x-rays of bilateral feet, 2 views, which showed severe MTP narrowing with subluxation of all MTP joints.  PIP/DIP narrowing, intertarsal joint space narrowing and tibiotalar joint narrowing consistent with rheumatoid arthritis.  Bilateral hand x-rays, 2 views, showed bilateral severe intercarpal joint space narrowing more prominent on the left than the right hand.  Radiocarpal ulnar joint space narrowing bilaterally.  Right hand  showed all MCP narrowing with subluxation and invagination.  Left hand showed 2nd, 3rd and 4th MCP narrowing, subluxation and invagination, all PIP/DIP narrowing.    Labs from April 04, 2013, show CMP with GFR normal.  CBC with diff shows mild anemia.  G6PD is normal at 17.5.     May 2017:  Comprehensive metabolic panel showed GFR of 57, Vitamin D and CBC normal  Rheumatoid arthritis.  She has severe erosive disease with subluxation of multiple joints.  She is on long term prednisone.   We discussed that she cannot have methotrexate or biologics due to history of lung cancer.  She does not want to use Plaquenil or sulfasalazine.      Imaging: Xr Hand 2 View Left  Result Date: 06/22/2016 Severe left first second and third MCP narrowing with invagination erosive changes severe intercarpal joint space narrowing with erosive changes and radiocarpal joint space narrowing. Left third PIP DIP narrowing. Impression: These findings are consistent with severe rheumatoid arthritis with erosive changes  Xr Hand 2 View Right  Result Date: 06/22/2016 Severe rheumatoid arthritis with erosive changes in all of her MCP joints subluxation of most of her MCP joints with invagination severe PIP narrowing severe intercarpal  joint space narrowing with erosive changes and radiocarpal joint space narrowing. These findings were consistent with severe rheumatoid arthritis with erosive disease.   Speciality Comments: No specialty comments available.    Procedures:  No procedures performed Allergies: Latex; Boniva [ibandronic acid]; Darvocet [propoxyphene n-acetaminophen]; Erythromycin; Morphine and related; Oxycontin [oxycodone hcl]; Sulfa antibiotics; Tramadol; and Vicodin [hydrocodone-acetaminophen]   Assessment / Plan:     Visit Diagnoses: Rheumatoid arthritis involving multiple sites, unspecified rheumatoid factor presence (HCC) - severe disease with nodulosis. She still have some low-grade synovitis. She is  on prednisone 5 mg a day and does not want to take any additional medications. She states on prednisone 5 mg a day her disease is very well controlled. X-rays obtained today show severe rheumatoid arthritis with erosive changes and subluxation of several of her MCPs. We had detailed discussion regarding DMARD's and she again declined.  Rheumatoid nodulosis (HCC)  High risk medication use - long term prednisone  She does not want to use Plaquenil or sulfasalazine.    Primary osteoarthritis of both hands: Joint protection and muscle strengthening discussed.  Primary osteoarthritis of both feet: She did not with me examine her feet she states she sees a podiatrist on [place] basis and her feet are not painful  History of lung cancer - Unable to use methotrexate or Biologics  Age-related osteoporosis without current pathological fracture : She states she's been getting bone density but she does not want any medications for treatment of osteoporosis she is on calcium and vitamin D  Her other medical problems include history of COPD, PACs, depression, insomnia Orders: Orders Placed This Encounter  Procedures  . XR Hand 2 View Right  . XR Hand 2 View Left   No orders of the defined types were placed in this encounter.   Face-to-face time spent with patient was 30 minutes. 50% of time was spent in counseling and coordination of care.  Follow-Up Instructions: Return in about 5 months (around 11/22/2016) for Rheumatoid arthritis.   Bo Merino, MD  Note - This record has been created using Editor, commissioning.  Chart creation errors have been sought, but may not always  have been located. Such creation errors do not reflect on  the standard of medical care.

## 2016-06-18 ENCOUNTER — Telehealth: Payer: Self-pay | Admitting: Rheumatology

## 2016-06-18 NOTE — Telephone Encounter (Signed)
Patient is wanting to know if she can have xrays done of her hands on 06/22/2016 when she comes in for an appointment? She only wants them done if her insurance will cover them. Please advise.

## 2016-06-21 NOTE — Telephone Encounter (Signed)
Usually xrays are covered by insurance. She can contact billing or medicare

## 2016-06-21 NOTE — Telephone Encounter (Signed)
Patient advised.

## 2016-06-22 ENCOUNTER — Ambulatory Visit (INDEPENDENT_AMBULATORY_CARE_PROVIDER_SITE_OTHER): Payer: Self-pay

## 2016-06-22 ENCOUNTER — Ambulatory Visit (INDEPENDENT_AMBULATORY_CARE_PROVIDER_SITE_OTHER): Payer: Medicare Other | Admitting: Rheumatology

## 2016-06-22 ENCOUNTER — Encounter: Payer: Self-pay | Admitting: Rheumatology

## 2016-06-22 VITALS — BP 124/78 | HR 78 | Resp 14 | Ht 59.0 in | Wt 104.0 lb

## 2016-06-22 DIAGNOSIS — M0630 Rheumatoid nodule, unspecified site: Secondary | ICD-10-CM | POA: Diagnosis not present

## 2016-06-22 DIAGNOSIS — M19072 Primary osteoarthritis, left ankle and foot: Secondary | ICD-10-CM

## 2016-06-22 DIAGNOSIS — Z79899 Other long term (current) drug therapy: Secondary | ICD-10-CM | POA: Diagnosis not present

## 2016-06-22 DIAGNOSIS — M19042 Primary osteoarthritis, left hand: Secondary | ICD-10-CM | POA: Diagnosis not present

## 2016-06-22 DIAGNOSIS — M069 Rheumatoid arthritis, unspecified: Secondary | ICD-10-CM

## 2016-06-22 DIAGNOSIS — M19071 Primary osteoarthritis, right ankle and foot: Secondary | ICD-10-CM | POA: Diagnosis not present

## 2016-06-22 DIAGNOSIS — Z85118 Personal history of other malignant neoplasm of bronchus and lung: Secondary | ICD-10-CM | POA: Diagnosis not present

## 2016-06-22 DIAGNOSIS — M810 Age-related osteoporosis without current pathological fracture: Secondary | ICD-10-CM

## 2016-06-22 DIAGNOSIS — M19041 Primary osteoarthritis, right hand: Secondary | ICD-10-CM

## 2016-06-28 NOTE — Progress Notes (Signed)
[name] 83 y.o. woman with a 2.7 cm clinical stage IA squamous cell carcinoma of the right upper lung radiation completed 05-09-12, 07-01-16 review CT chest w contrast 3 month FU.  Weight changes, if any: Wt Readings from Last 3 Encounters:  07/08/16 109 lb 9.6 oz (49.7 kg)  06/22/16 104 lb (47.2 kg)  03/25/16 105 lb (47.6 kg)   Respiratory complaints, if any: Denies SOB,coughing or wheezing Hemoptysis, if HER:DEYC   Swallowing Problems/Pain/Difficulty swallowing:None Smoking Tobacco/Marijuana/Snuff/ETOH use:Former smoker x     Quit 01-11-2010 Appetite :Good eating three meals a day and snacks Pain:None When is next chemo scheduled?: Lab work from of chart:07-01-16 B[facility]  Imaging:07-01-16 CT chest w contrast BP (!) 165/72   Pulse 73   Temp 99.1 F (37.3 C) (Oral)   Resp 18   Ht '4\' 11"'$  (1.499 m)   Wt 109 lb 9.6 oz (49.7 kg)   SpO2 99%   BMI 22.14 kg/m

## 2016-06-29 ENCOUNTER — Telehealth: Payer: Self-pay | Admitting: *Deleted

## 2016-06-29 NOTE — Telephone Encounter (Signed)
CALLED PATIENT'S DAUGHTER- [name] MAXEY TO INFORM OF STAT LABS ON 07-01-16, LVM FOR A RETURN CALL

## 2016-07-01 ENCOUNTER — Ambulatory Visit
Admission: RE | Admit: 2016-07-01 | Discharge: 2016-07-01 | Disposition: A | Discharge status: Home / Self Care | Payer: Medicare Other | Source: Ambulatory Visit | Attending: Radiation Oncology | Admitting: Radiation Oncology

## 2016-07-01 ENCOUNTER — Ambulatory Visit (HOSPITAL_COMMUNITY)
Admission: RE | Admit: 2016-07-01 | Discharge: 2016-07-01 | Disposition: A | Discharge status: Home / Self Care | Payer: Medicare Other | Source: Ambulatory Visit | Attending: Radiation Oncology | Admitting: Radiation Oncology

## 2016-07-01 DIAGNOSIS — R911 Solitary pulmonary nodule: Secondary | ICD-10-CM | POA: Diagnosis not present

## 2016-07-01 DIAGNOSIS — C3411 Malignant neoplasm of upper lobe, right bronchus or lung: Secondary | ICD-10-CM | POA: Diagnosis not present

## 2016-07-01 LAB — BASIC METABOLIC PANEL
Anion Gap: 11 mEq/L (ref 3–11)
BUN: 19.9 mg/dL (ref 7.0–26.0)
CO2: 26 mEq/L (ref 22–29)
Calcium: 9.7 mg/dL (ref 8.4–10.4)
Chloride: 105 mEq/L (ref 98–109)
Creatinine: 1.1 mg/dL (ref 0.6–1.1)
EGFR: 44 mL/min/{1.73_m2} — ABNORMAL LOW (ref >90)
Glucose: 105 mg/dl (ref 70–140)
Potassium: 4.2 mEq/L (ref 3.5–5.1)
Sodium: 142 mEq/L (ref 136–145)

## 2016-07-01 MED ORDER — IOPAMIDOL (ISOVUE-300) INJECTION 61%
INTRAVENOUS | Status: AC
  Filled 2016-07-01: qty 75

## 2016-07-01 MED ORDER — IOPAMIDOL (ISOVUE-300) INJECTION 61%
75.0000 mL | Freq: Once | INTRAVENOUS | Status: AC | PRN
Start: 2016-07-01 — End: 2016-07-01
  Administered 2016-07-01: 75 mL via INTRAVENOUS

## 2016-07-08 ENCOUNTER — Ambulatory Visit
Admission: RE | Admit: 2016-07-08 | Discharge: 2016-07-08 | Disposition: A | Discharge status: Home / Self Care | Payer: Medicare Other | Source: Ambulatory Visit | Attending: Radiation Oncology | Admitting: Radiation Oncology

## 2016-07-08 ENCOUNTER — Encounter: Payer: Self-pay | Admitting: Radiation Oncology

## 2016-07-08 VITALS — BP 165/72 | HR 73 | Temp 99.1°F | Resp 18 | Ht 59.0 in | Wt 109.6 lb

## 2016-07-08 DIAGNOSIS — Z87891 Personal history of nicotine dependence: Secondary | ICD-10-CM | POA: Insufficient documentation

## 2016-07-08 DIAGNOSIS — Z79899 Other long term (current) drug therapy: Secondary | ICD-10-CM | POA: Insufficient documentation

## 2016-07-08 DIAGNOSIS — C3411 Malignant neoplasm of upper lobe, right bronchus or lung: Secondary | ICD-10-CM

## 2016-07-08 DIAGNOSIS — Z91040 Latex allergy status: Secondary | ICD-10-CM | POA: Diagnosis not present

## 2016-07-08 NOTE — Progress Notes (Signed)
Radiation Oncology         [phone] ________________________________  Name: [name] MRN: [id number]  Date: 07/08/2016  DOB: 1929-07-29    Follow-Up Visit Note  CC: Mayra Neer, MD  Gaye Pollack, MD  Diagnosis: 83 y.o. woman with a history of a 2.7 cm clinical stage IA squamous cell carcinoma of the right upper lung (She has left lower lung nodule also, which was negative on PET and has not been biopsied)    ICD-9-CM ICD-10-CM   1. Malignant neoplasm of upper lobe of right lung (HCC) 162.3 C34.11 CT CHEST W CONTRAST  2. Primary cancer of right upper lobe of lung (HCC) 162.3 C34.11     Interval Since Last Radiation: 4 years and 2 months  05/02/2012, 05/04/2012, 05/09/2012: The tumor was treated to 54 Gy in 3 fractions of 18 Gy.  Narrative:  The patient returns today for routine follow-up. In summary she was treated surgically for a stage I lung cancer in the 1990s. She was found to have a PET positive lesion in 2014 and this was biopsied consistent with SCC of the right upper lobe. She received SBRT, and has been since followed in surveillance. She has had several short interval scans due to some changes in the primary lesion and development of another small right upper lobe lesion. Follow up PET did not reveal hypermetabolic activity in the new lesion, and her previously treated lesion was reviewed with Dr. Tammi Klippel and felt to be mass like fibrosis rather than malignant change. She comes today to review her last Chest CT scan which was performed on 07/01/16 and revealed stability of the findings since her December 2017 scan.  On review of systems, the patient reports that she is doing well overall. She denies any chest pain, shortness of breath, cough, fevers, chills, night sweats, unintended weight changes. She denies any bowel or bladder disturbances, and denies abdominal pain, nausea or vomiting. She denies any new musculoskeletal or joint aches or pains, new skin lesions or  concerns. A complete review of systems is obtained and is otherwise negative.   Past Medical History:  Past Medical History:  Diagnosis Date  . Cancer ([place])    lung ca  . COPD (chronic obstructive pulmonary disease) ([place])   . Hernia, umbilical   . Hip pain, right   . History of radiation therapy 05/02/12-05/04/12,&05/09/12   rul lung 54Gy/69f  . HLD (hyperlipidemia)   . Lung mass   . Osteoporosis   . PAC (premature atrial contraction)   . RA (rheumatoid arthritis) ([place]   . Shingles     Past Surgical History: Past Surgical History:  Procedure Laterality Date  . right lobectomy      Social History:  Social History   Social History  . Marital status: Single    Spouse name: N/A  . Number of children: N/A  . Years of education: N/A   Occupational History  . retired Retired   Social History Main Topics  . Smoking status: Former Smoker    Types: Cigarettes    Quit date: 01/11/2010  . Smokeless tobacco: Never Used  . Alcohol use No  . Drug use: No  . Sexual activity: Not on file   Other Topics Concern  . Not on file   Social History Narrative   [facility]   Family History:  Family History  Problem Relation Age of Onset  . Cancer Daughter       ALLERGIES:  is allergic to latex; boniva [ibandronic acid]; darvocet [propoxyphene n-acetaminophen]; erythromycin; morphine and related; oxycontin [oxycodone hcl]; sulfa antibiotics; tramadol; and vicodin [hydrocodone-acetaminophen].  Meds: Current Outpatient Prescriptions  Medication Sig Dispense Refill  . Cholecalciferol (VITAMIN D-3) 1000 units CAPS Take 2 capsules by mouth daily.    . folic acid (FOLVITE) 1 MG tablet Take 1 tablet (1 mg total) by mouth daily.    . Multiple Vitamins-Minerals (MULTIVITAMIN & MINERAL PO) Take 1 tablet by mouth daily.    . predniSONE (DELTASONE) 5 MG tablet Take 1 tablet (5 mg total) by mouth daily with breakfast. 100 tablet 1   No current facility-administered  medications for this encounter.     Physical Findings:  height is '4\' 11"'$  (1.499 m) and weight is 109 lb 9.6 oz (49.7 kg). Her oral temperature is 99.1 F (37.3 C). Her blood pressure is 165/72 (abnormal) and her pulse is 73. Her respiration is 18 and oxygen saturation is 99%.   In general this is a elderly, well appearing Caucasian female in no acute distress. She's alert and oriented x4 and appropriate throughout the examination. Cardiopulmonary assessment is negative for acute distress and she exhibits normal effort.    Lab Findings: Lab Results  Component Value Date   WBC 5.0 12/28/2012   HGB 8.4 (L) 12/28/2012   HCT 24.5 (L) 12/28/2012   PLT 49 (L) 12/28/2012    Lab Results  Component Value Date   NA 142 07/01/2016   K 4.2 07/01/2016   CHLORIDE 105 07/01/2016   CO2 26 07/01/2016   GLUCOSE 105 07/01/2016   BUN 19.9 07/01/2016   CREATININE 1.1 07/01/2016   BILITOT 1.0 12/24/2012   ALKPHOS 42 12/24/2012   AST 18 12/24/2012   ALT 9 12/24/2012   PROT 5.2 (L) 12/24/2012   ALBUMIN 2.4 (L) 12/24/2012   CALCIUM 9.7 07/01/2016   ANIONGAP 11 07/01/2016    Radiographic Findings: Ct Chest W Contrast  Result Date: 07/01/2016 CLINICAL DATA:  RIGHT lung cancer diagnosed 2013 and radiation therapy complete. EXAM: CT CHEST WITH CONTRAST TECHNIQUE: Multidetector CT imaging of the chest was performed during intravenous contrast administration. CONTRAST:  7m ISOVUE-300 IOPAMIDOL (ISOVUE-300) INJECTION 61% COMPARISON:  CT 03/24/2016 FINDINGS: Cardiovascular: Coronary artery calcification and aortic atherosclerotic calcification. Mediastinum/Nodes: No axillary or supraclavicular lymphadenopathy. No mediastinal hilar adenopathy. Fluid near the esophagus. No pericardial fluid. Lungs/Pleura: Volume loss in the RIGHT hemithorax consistent with RIGHT upper lobectomy. Band of pleural-parenchymal thickening in the RIGHT upper lobe at treatment site visually is unchange. lesion measures roughly 3.4 by  4.8 cm. Linear nodular RIGHT apical density is also unchanged (image 20). More medial nodule measuring 7 mm (image 36, series 5_ is also unchanged. Calcified nodules in new LEFT lower lobe. Spiculated nodule in the upper aspect of the LEFT lower lobes measuring 10 mm (image 68, series 5) not changed Upper Abdomen: Adrenal glands normal. No acute findings malignancy evident in the upper abdomen Musculoskeletal: No aggressive osseous lesion. Extensive degenerate change kyphosis IMPRESSION: 1. Stable pleural-parenchymal masslike thickening in the RIGHT upper lobe as well as linear and nodular RIGHT apical findings. 2. No evidence of a new lesion or disease progression. 3. Spiculated nodule in the LEFT lower lobe is also unchanged. Electronically Signed   By: SSuzy BouchardM.D.   On: 07/01/2016 16:37   Xr Hand 2 View Left  Result Date: 06/22/2016 Severe left first second and third MCP narrowing with invagination erosive changes severe intercarpal joint space narrowing with erosive changes and radiocarpal  joint space narrowing. Left third PIP DIP narrowing. Impression: These findings are consistent with severe rheumatoid arthritis with erosive changes  Xr Hand 2 View Right  Result Date: 06/22/2016 Severe rheumatoid arthritis with erosive changes in all of her MCP joints subluxation of most of her MCP joints with invagination severe PIP narrowing severe intercarpal joint space narrowing with erosive changes and radiocarpal joint space narrowing. These findings were consistent with severe rheumatoid arthritis with erosive disease.  Impression:  1. Stage IA, squamous cell carcinoma of the right upper lobe lung cancer. Again we reviewed the findings from her CT scan today and detailed the imaging and reviewed the films together. In discussing these findings, we would favor close follow up imaging. She is interested in lengthening the time frame for her next scan. Taking into consideration her age and  comorbidities, we spent time discussing this would be reasonable, however, she is counseled on the risks of waiting the 6 months that she prefers in that during such a time she could develop progression of the primary lesion, new lesions, local advancement into the lymph nodes, or metastases. She states agreement and understanding, and also understands that during the last interval scan it was an option to consider a biopsy of the site considered mass like fibrosis, which she also wishes to avoid. We will plan to see her back in 6 months' time for continued evaluation, and she will keep me informed of any questions or concerns she has prior to that visit.  In a visit lasting 30 minutes, greater than 50% of the time was spent face to face discussing the patient's scans reviewing the images, and coordinating the patient's care.    Carola Rhine, PAC

## 2016-08-25 ENCOUNTER — Telehealth: Payer: Self-pay | Admitting: Rheumatology

## 2016-08-25 NOTE — Telephone Encounter (Signed)
Patient calling due to increased pain, and numbness now with hand. Patient increased Prednisone yesterday, took '5mg'$  in am then '5mg'$  in pm. Wants to know if she can come in today, or if something can be done without a visit. Patient very anxious, please advise.

## 2016-08-25 NOTE — Telephone Encounter (Signed)
There is nothing else to offer. We have discussed DMARD's with her several times which she declined. She can discuss with her PCP if she wants to be referred to pain clinic. She should be able to take Tylenol.

## 2016-08-25 NOTE — Telephone Encounter (Signed)
Attempted to contact the patient and left message for patient to call the office.  

## 2016-08-25 NOTE — Telephone Encounter (Signed)
Patient states she is having pain in her left hand and numbness in her fingers. Patient states this happens on and off but it started again last night. Patient states she took her normal dose of Prednisone yesterday morning. Patient states that she took an extra dose of Prednisone at 3:30 am this morning and then resumed her normal this morning. Patient states she is on Prednisone 5 mg daily. Patient would like to know what she can do for the pain and numbness in her left hand.

## 2016-08-26 NOTE — Telephone Encounter (Signed)
Patient returned call and has been advised Dr. Estanislado Pandy has nothing lese to offer. Patient advised to discuss a pain clinic referral with PCP if she would like to have that. Otherwise she can take Tylenol. Patient verbalized understanding.

## 2016-10-09 ENCOUNTER — Other Ambulatory Visit: Payer: Self-pay | Admitting: Rheumatology

## 2016-10-11 NOTE — Telephone Encounter (Signed)
Okay to refill per Dr. Deveshwar.  

## 2016-10-11 NOTE — Telephone Encounter (Signed)
Last Visit: 06/22/16 Next Visit: 11/23/16  Okay to refill Prednisone?

## 2016-10-12 ENCOUNTER — Ambulatory Visit (INDEPENDENT_AMBULATORY_CARE_PROVIDER_SITE_OTHER): Payer: Medicare Other | Admitting: Podiatry

## 2016-10-12 ENCOUNTER — Encounter: Payer: Self-pay | Admitting: Podiatry

## 2016-10-12 DIAGNOSIS — B351 Tinea unguium: Secondary | ICD-10-CM | POA: Diagnosis not present

## 2016-10-12 DIAGNOSIS — M79676 Pain in unspecified toe(s): Secondary | ICD-10-CM | POA: Diagnosis not present

## 2016-10-12 NOTE — Progress Notes (Signed)
Complaint:  Visit Type: Patient returns to my office for continued preventative foot care services. Complaint: Patient states" my nails have grown long and thick and become painful to walk and wear shoes"  The patient presents for preventative foot care services. No changes to ROS  Podiatric Exam: Vascular: dorsalis pedis and posterior tibial pulses are palpable bilateral. Capillary return is immediate. Temperature gradient is WNL. Skin turgor WNL  Sensorium: Normal Semmes Weinstein monofilament test. Normal tactile sensation bilaterally. Nail Exam: Pt has thick disfigured discolored nails with subungual debris noted bilateral entire nail hallux through fifth toenails Ulcer Exam: There is no evidence of ulcer or pre-ulcerative changes or infection. Orthopedic Exam: Muscle tone and strength are WNL. No limitations in general ROM. No crepitus or effusions noted. Foot type and digits show no abnormalities. Bony prominences are unremarkable. Swelling  B/L. Skin: No Porokeratosis. No infection or ulcers.  Asymptomatic  Plantar tyloma.  Diagnosis:  Onychomycosis, , Pain in right toe, pain in left toes  Treatment & Plan Procedures and Treatment: Consent by patient was obtained for treatment procedures. The patient understood the discussion of treatment and procedures well. All questions were answered thoroughly reviewed. Debridement of mycotic and hypertrophic toenails, 1 through 5 bilateral and clearing of subungual debris. No ulceration, no infection noted.  Return Visit-Office Procedure: Patient instructed to return to the office for a follow up visit 3 months for continued evaluation and treatment.    Gardiner Barefoot DPM

## 2016-10-13 ENCOUNTER — Ambulatory Visit: Payer: Medicare Other | Admitting: Podiatry

## 2016-11-12 ENCOUNTER — Encounter: Payer: Self-pay | Admitting: Rheumatology

## 2016-11-12 ENCOUNTER — Ambulatory Visit (INDEPENDENT_AMBULATORY_CARE_PROVIDER_SITE_OTHER): Payer: Medicare Other | Admitting: Rheumatology

## 2016-11-12 VITALS — BP 142/49 | HR 71 | Resp 16 | Ht 59.0 in | Wt 106.0 lb

## 2016-11-12 DIAGNOSIS — M0630 Rheumatoid nodule, unspecified site: Secondary | ICD-10-CM | POA: Diagnosis not present

## 2016-11-12 DIAGNOSIS — M069 Rheumatoid arthritis, unspecified: Secondary | ICD-10-CM | POA: Diagnosis not present

## 2016-11-12 DIAGNOSIS — M79641 Pain in right hand: Secondary | ICD-10-CM

## 2016-11-12 DIAGNOSIS — Z79899 Other long term (current) drug therapy: Secondary | ICD-10-CM

## 2016-11-12 DIAGNOSIS — M79642 Pain in left hand: Secondary | ICD-10-CM | POA: Diagnosis not present

## 2016-11-12 NOTE — Progress Notes (Signed)
Office Visit Note  Patient: [name]             Date of Birth: [birth year]           MRN: [id number]             PCP: Mayra Neer, MD Referring: Mayra Neer, MD Visit Date: 11/12/2016 Occupation: _0 @    Subjective:  Rheumatoid Arthritis Hand pain  History of Present Illness: [name] is a 83 y.o. female  Who was last seen 06/22/2016 for rheumatoid arthritis, high risk prescription (prednisone 5 mg daily since patient does not want to use DMARD's).  Patient feels that she is doing very well currently. She does not want any DMARD's as noted in previous visits. She is happy with her regimen of prednisone which he takes 5 mg daily.   Activities of Daily Living:  Patient reports morning stiffness for 10 minutes.   Patient Denies nocturnal pain.  Difficulty dressing/grooming: Reports Difficulty climbing stairs: Reports Difficulty getting out of chair: Reports Difficulty using hands for taps, buttons, cutlery, and/or writing: Reports   Review of Systems  Constitutional: Negative for fatigue.  HENT: Negative for mouth sores and mouth dryness.   Eyes: Negative for dryness.  Respiratory: Negative for shortness of breath.   Gastrointestinal: Negative for constipation and diarrhea.  Musculoskeletal: Negative for myalgias and myalgias.  Skin: Negative for sensitivity to sunlight.  Psychiatric/Behavioral: Negative for decreased concentration and sleep disturbance.    PMFS History:  Patient Active Problem List   Diagnosis Date Noted  . 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 Primary osteoarthritis of both hands 06/09/2016  . Primary osteoarthritis of both feet 06/09/2016  . History of lung cancer 06/09/2016  . High risk medication use 06/09/2016  . Aortic calcification ([place]) 05/10/2013  . Coronary artery calcification 05/10/2013  . Protein-calorie malnutrition, severe ([place]) 12/25/2012  . Depression 12/25/2012  . Anemia of chronic disease 12/24/2012    . Thrombocytopenia, unspecified 12/24/2012  . Aphthous stomatitis 12/24/2012  . Bleeding from nasopharynx 12/24/2012  . Cellulitis 12/23/2012  . Hemoptysis? Secondary to either irritation by dentures, vs. secondary to lung neoplasm 12/23/2012  . Rheumatoid arthritis-severe 12/23/2012  . COPD, mild ([place]) 12/23/2012  . Osteoporosis 12/23/2012  . Primary cancer of right upper lobe of lung ([place]) 03/21/2012    Past Medical History:  Diagnosis Date  . Cancer ([place])    lung ca  . COPD (chronic obstructive pulmonary disease) ([place])   . Hernia, umbilical   . Hip pain, right   . History of radiation therapy 05/02/12-05/04/12,&05/09/12   rul lung 54Gy/31f  . HLD (hyperlipidemia)   . Lung mass   . Osteoporosis   . PAC (premature atrial contraction)   . RA (rheumatoid arthritis) ([place]   . Shingles     Family History  Problem Relation Age of Onset  . Cancer Daughter    Past Surgical History:  Procedure Laterality Date  . right lobectomy     Social History   Social History Narrative   [facility]    Objective: Vital Signs: BP (!) 142/49 (BP Location: Left Arm, Patient Position: Sitting, Cuff Size: Normal)   Pulse 71   Resp 16   Ht _1  (1.499 m)   Wt 106 lb (48.1 kg)   BMI 21.41 kg/m    Physical Exam  Constitutional: She is oriented to person, place, and time. She appears well-developed and well-nourished.  HENT:  Head: Normocephalic and atraumatic.  Eyes: Pupils  are equal, round, and reactive to light. EOM are normal.  Cardiovascular: Normal rate, regular rhythm and normal heart sounds.  Exam reveals no gallop and no friction rub.   No murmur heard. Pulmonary/Chest: Effort normal and breath sounds normal. She has no wheezes. She has no rales.  Abdominal: Soft. Bowel sounds are normal. She exhibits no distension. There is no tenderness. There is no guarding. No hernia.  Musculoskeletal: Normal range of motion. She exhibits no edema, tenderness or deformity.   Lymphadenopathy:    She has no cervical adenopathy.  Neurological: She is alert and oriented to person, place, and time. Coordination normal.  Skin: Skin is warm and dry. Capillary refill takes less than 2 seconds. No rash noted.  Psychiatric: She has a normal mood and affect. Her behavior is normal.  Nursing note and vitals reviewed.    Musculoskeletal Exam:    CDAI Exam: CDAI Homunculus Exam:   Joint Counts:  CDAI Tender Joint count: 0 CDAI Swollen Joint count: 0  Global Assessments:  Patient Global Assessment: 2 Provider Global Assessment: 2  CDAI Calculated Score: 4    Investigation: No additional findings. Hospital Outpatient Visit on 07/01/2016  Component Date Value Ref Range Status  . Sodium 07/01/2016 142  136 - 145 mEq/L Final  . Potassium 07/01/2016 4.2  3.5 - 5.1 mEq/L Final  . Chloride 07/01/2016 105  98 - 109 mEq/L Final  . CO2 07/01/2016 26  22 - 29 mEq/L Final  . Glucose 07/01/2016 105  70 - 140 mg/dl Final   Glucose reference range is for nonfasting patients. Fasting glucose reference range is 70- 100.  Marland Kitchen BUN 07/01/2016 19.9  7.0 - 26.0 mg/dL Final  . Creatinine 07/01/2016 1.1  0.6 - 1.1 mg/dL Final  . Calcium 07/01/2016 9.7  8.4 - 10.4 mg/dL Final  . Anion Gap 07/01/2016 11  3 - 11 mEq/L Final  . EGFR 07/01/2016 44* >90 ml/min/1.73 m2 Final   eGFR is calculated using the CKD-EPI Creatinine Equation (2009)     Imaging: No results found.          Patient has ulnar deviation of her right hand which is quite advanced from rheumatoid arthritis that's poorly controlled and synovitis and synovial thickening of the left hand at few MCP joints. Patient is not having much pain at this time. Is satisfied with her prednisone 5 mg daily treatment regimen and does not want any DMARD's. Due to the history of cancer, Biologics are not an option for this patient.  Speciality Comments: No specialty comments available.    Procedures:  No procedures  performed Allergies: Latex; Boniva [ibandronic acid]; Darvocet [propoxyphene n-acetaminophen]; Erythromycin; Morphine and related; Oxycontin [oxycodone hcl]; Sulfa antibiotics; Tramadol; and Vicodin [hydrocodone-acetaminophen]   Assessment / Plan:     Visit Diagnoses: Rheumatoid arthritis involving multiple sites, unspecified rheumatoid factor presence (HCC)  Rheumatoid nodulosis (HCC)  High risk medication use  Bilateral hand pain   Plan: #1: Rheumatoid arthritis.  #2: History of ulnar deviation of the right hand with synovial thickening And synovial thickening of the left hand with some synovitis. (Low-grade). Patient is content with her state of arthritis at this time. She continues to take prednisone 5 mg daily. She does not want to add any DMARD's. She is not eligible for biologic due to her history of cancer.  #3: Recently saw her PCP did annual physical and patient reports a lot of blood work was done and she would appreciate if we did not draw any  blood and obtain labs from PCP. The last labs that we have for the patient is dated March 2018 and their satisfactory.  #4: Bilateral hand pain  #5: Return to clinic in 4 months  Orders: No orders of the defined types were placed in this encounter.  No orders of the defined types were placed in this encounter.   Face-to-face time spent with patient was 30 minutes. Greater than 50% of time was spent in counseling and coordination of care.  Follow-Up Instructions: Return in about 5 months (around 04/14/2017) for RA,severe,prednisone 43m, no dmards,.   NEliezer Lofts PA-C  Note - This record has been created using [employer]  Chart creation errors have been sought, but may not always  have been located. Such creation errors do not reflect on  the standard of medical care.

## 2016-11-23 ENCOUNTER — Ambulatory Visit: Payer: Medicare Other | Admitting: Rheumatology

## 2016-12-15 ENCOUNTER — Ambulatory Visit: Payer: Medicare Other | Admitting: Rheumatology

## 2016-12-29 ENCOUNTER — Telehealth: Payer: Self-pay | Admitting: *Deleted

## 2016-12-29 NOTE — Telephone Encounter (Signed)
Called patient to inform of Ct for 01-07-17 - arrival time - 12:45 pm @ [facility], pt. to have clear liquids only 4 hrs. Prior to test, spoke with patient's daughter Lenn Sink and she is aware of this test and her fu with Shona Simpson on 01/12/17 @ 1:30 pm

## 2016-12-30 ENCOUNTER — Other Ambulatory Visit: Payer: Self-pay | Admitting: *Deleted

## 2016-12-30 DIAGNOSIS — C3411 Malignant neoplasm of upper lobe, right bronchus or lung: Secondary | ICD-10-CM

## 2017-01-03 ENCOUNTER — Ambulatory Visit: Admission: RE | Admit: 2017-01-03 | Payer: Medicare Other | Source: Ambulatory Visit

## 2017-01-03 NOTE — Progress Notes (Signed)
[name] 83 y.o.woman with a 2.7 cm clinical stage IA squamous cell carcinoma of the right upper lung radiation completed 05-09-12, 01-07-17 review CT chest w contrast 6 month FU.   Respiratory complaints, if any: Denies SOB,coughing and wheezing Hemoptysis, if any:No Swallowing Problems/Pain/Difficulty swallowing:No problems eating or drinking. Appetite :Good eating three meals a day and snacks Pain:None When is next chemo scheduled?:No Lab work from of chart:01-07-17 BUN and creatinine   Imaging:01-07-17 CT chest w contrast Wt Readings from Last 3 Encounters:  01/12/17 106 lb (48.1 kg)  11/12/16 106 lb (48.1 kg)  07/08/16 109 lb 9.6 oz (49.7 kg)   BP (!) 146/59   Pulse 73   Temp (!) 97.5 F (36.4 C) (Oral)   Resp 16   Ht 5\' 11"  (1.803 m)   Wt 106 lb (48.1 kg)   SpO2 98%   BMI 14.78 kg/m

## 2017-01-07 ENCOUNTER — Ambulatory Visit (HOSPITAL_COMMUNITY)
Admission: RE | Admit: 2017-01-07 | Discharge: 2017-01-07 | Disposition: A | Discharge status: Home / Self Care | Payer: Medicare Other | Source: Ambulatory Visit | Attending: Radiation Oncology | Admitting: Radiation Oncology

## 2017-01-07 ENCOUNTER — Ambulatory Visit
Admission: RE | Admit: 2017-01-07 | Discharge: 2017-01-07 | Disposition: A | Discharge status: Home / Self Care | Payer: Medicare Other | Source: Ambulatory Visit | Attending: Radiation Oncology | Admitting: Radiation Oncology

## 2017-01-07 DIAGNOSIS — C3411 Malignant neoplasm of upper lobe, right bronchus or lung: Secondary | ICD-10-CM | POA: Diagnosis not present

## 2017-01-07 DIAGNOSIS — Z923 Personal history of irradiation: Secondary | ICD-10-CM | POA: Insufficient documentation

## 2017-01-07 DIAGNOSIS — R918 Other nonspecific abnormal finding of lung field: Secondary | ICD-10-CM | POA: Insufficient documentation

## 2017-01-07 LAB — BUN AND CREATININE (CC13)
BUN: 28.2 mg/dL — ABNORMAL HIGH (ref 7.0–26.0)
Creatinine: 1.1 mg/dL (ref 0.6–1.1)
EGFR: 47 mL/min/{1.73_m2} — ABNORMAL LOW (ref >90)

## 2017-01-07 MED ORDER — IOPAMIDOL (ISOVUE-300) INJECTION 61%
INTRAVENOUS | Status: AC
  Filled 2017-01-07: qty 75

## 2017-01-07 MED ORDER — IOPAMIDOL (ISOVUE-300) INJECTION 61%
75.0000 mL | Freq: Once | INTRAVENOUS | Status: AC | PRN
  Administered 2017-01-07: 75 mL via INTRAVENOUS

## 2017-01-12 ENCOUNTER — Encounter: Payer: Self-pay | Admitting: Radiation Oncology

## 2017-01-12 ENCOUNTER — Ambulatory Visit
Admission: RE | Admit: 2017-01-12 | Discharge: 2017-01-12 | Disposition: A | Discharge status: Home / Self Care | Payer: Medicare Other | Source: Ambulatory Visit | Attending: Radiation Oncology | Admitting: Radiation Oncology

## 2017-01-12 VITALS — BP 146/59 | HR 73 | Temp 97.5°F | Resp 16 | Ht 71.0 in | Wt 106.0 lb

## 2017-01-12 DIAGNOSIS — C3411 Malignant neoplasm of upper lobe, right bronchus or lung: Secondary | ICD-10-CM | POA: Diagnosis present

## 2017-01-12 DIAGNOSIS — Z7189 Other specified counseling: Secondary | ICD-10-CM

## 2017-01-12 DIAGNOSIS — Z923 Personal history of irradiation: Secondary | ICD-10-CM | POA: Diagnosis not present

## 2017-01-12 NOTE — Progress Notes (Signed)
Radiation Oncology         [phone] ________________________________  Name: [name] MRN: [id number]  Date: 01/12/2017  DOB: 05/26/1929    Follow-Up Visit Note  CC: Mayra Neer, MD  Gaye Pollack, MD  Diagnosis: 83 y.o. woman with a history of a 2.7 cm clinical stage IA squamous cell carcinoma of the right upper lung (She has left lower lung nodule also, which was negative on PET and has not been biopsied)  No diagnosis found.  Interval Since Last Radiation: 4 years and 8 months  05/02/2012, 05/04/2012, 05/09/2012: The tumor was treated to 54 Gy in 3 fractions of 18 Gy.  Narrative:  The patient returns today for routine follow-up. In summary she was treated surgically for a stage I lung cancer in the 1990s. She was found to have a PET positive lesion in 2014 and this was biopsied consistent with SCC of the right upper lobe. She received SBRT, and has been since followed in surveillance. She has had several short interval scans due to some changes in the primary lesion and development of another small right upper lobe lesion. Follow up PET did not reveal hypermetabolic activity in the new lesion, and her previously treated lesion was reviewed with Dr. Tammi Klippel and felt to be mass like fibrosis rather than malignant change. She has had several interval surveillance exam, her scan on 03/15/2018indicated stable changes without new disease, we discussed options for returning to 6 month surveillance,which she agreed to, and she returned on 01/07/2017 port repeat CT with contrast. Stability was again noted with no new lesions she comes today to review this.  On review of systems, the patient reports that she is doing well overall. She has an interest in meeting with an attorney. Her living well and her power of attorney as well as discussing her financial plans and possibly changing her formal power of attorney documentation. She denies any chest pain, shortness of breath, cough, fevers,  chills, night sweats, unintended weight changes. She denies any bowel or bladder disturbances, and denies abdominal pain, nausea or vomiting. She denies any new musculoskeletal or joint aches or pains, new skin lesions or concerns. A complete review of systems is obtained and is otherwise negative.   Past Medical History:  Past Medical History:  Diagnosis Date  . Cancer ([place])    lung ca  . COPD (chronic obstructive pulmonary disease) ([place])   . Hernia, umbilical   . Hip pain, right   . History of radiation therapy 05/02/12-05/04/12,&05/09/12   rul lung 54Gy/39fx  . HLD (hyperlipidemia)   . Lung mass   . Osteoporosis   . PAC (premature atrial contraction)   . RA (rheumatoid arthritis) ([place])   . Shingles     Past Surgical History: Past Surgical History:  Procedure Laterality Date  . right lobectomy      Social History:  Social History   Social History  . Marital status: Widowed    Spouse name: N/A  . Number of children: N/A  . Years of education: N/A   Occupational History  . retired Retired   Social History Main Topics  . Smoking status: Former Smoker    Types: Cigarettes    Quit date: 01/11/2010  . Smokeless tobacco: Never Used  . Alcohol use No  . Drug use: No  . Sexual activity: Not on file   Other Topics Concern  . Not on file   Social History Narrative   Lives in ALF at [facility]  Family History:  Family History  Problem Relation Age of Onset  . Cancer Daughter       ALLERGIES:  is allergic to latex; boniva [ibandronic acid]; darvocet [propoxyphene n-acetaminophen]; erythromycin; morphine and related; oxycontin [oxycodone hcl]; sulfa antibiotics; tramadol; and vicodin [hydrocodone-acetaminophen].  Meds: Current Outpatient Prescriptions  Medication Sig Dispense Refill  . Cholecalciferol (VITAMIN D-3) 1000 units CAPS Take 2 capsules by mouth daily.    . folic acid (FOLVITE) 1 MG tablet Take 1 tablet (1 mg total) by mouth daily.    . Multiple  Vitamins-Minerals (MULTIVITAMIN & MINERAL PO) Take 1 tablet by mouth daily.    . predniSONE (DELTASONE) 5 MG tablet Take 1 tablet (5 mg total) by mouth daily with breakfast. 100 tablet 0   No current facility-administered medications for this encounter.     Physical Findings:  height is 5\' 11"  (1.803 m) and weight is 106 lb (48.1 kg). Her oral temperature is 97.5 F (36.4 C) (abnormal). Her blood pressure is 146/59 (abnormal) and her pulse is 73. Her respiration is 16 and oxygen saturation is 98%.   In general this is a elderly, well appearing Caucasian female in no acute distress. She's alert and oriented x4 and appropriate throughout the examination. Cardiovascular exam reveals a regular rate and rhythm, no clicks or rubs  are auscultated. Holosystolic murmur is noted over the precordium. Chest is clear to auscultation bilaterally.   Lab Findings: Lab Results  Component Value Date   WBC 5.0 12/28/2012   HGB 8.4 (L) 12/28/2012   HCT 24.5 (L) 12/28/2012   PLT 49 (L) 12/28/2012    Lab Results  Component Value Date   NA 142 07/01/2016   K 4.2 07/01/2016   CHLORIDE 105 07/01/2016   CO2 26 07/01/2016   GLUCOSE 105 07/01/2016   BUN 28.2 (H) 01/07/2017   CREATININE 1.1 01/07/2017   BILITOT 1.0 12/24/2012   ALKPHOS 42 12/24/2012   AST 18 12/24/2012   ALT 9 12/24/2012   PROT 5.2 (L) 12/24/2012   ALBUMIN 2.4 (L) 12/24/2012   CALCIUM 9.7 07/01/2016   ANIONGAP 11 07/01/2016    Radiographic Findings: Ct Chest W Contrast  Result Date: 01/08/2017 CLINICAL DATA:  Malignant neoplasm of upper lobe of right lung. No new complaints. EXAM: CT CHEST WITH CONTRAST TECHNIQUE: Multidetector CT imaging of the chest was performed during intravenous contrast administration. CONTRAST:  50mL ISOVUE-300 IOPAMIDOL (ISOVUE-300) INJECTION 61% COMPARISON:  CT 07/01/2016 FINDINGS: Cardiovascular: No significant vascular findings. Normal heart size. No pericardial effusion. Mediastinum/Nodes: No axillary or  supraclavicular adenopathy. No mediastinal hilar adenopathy. No pericardial fluid. Esophagus normal. Lungs/Pleura: In the RIGHT upper lobe, peripheral consolidation surrounds a surgical staple line measures 4.2 x 3.2 cm compared to 4.8 x 3.4 cm for no interval change. Bronchiectasis leads up to this consolidative mass. More superior RIGHT upper lobe there is linear nodular thickening also unchanged. RIGHT lower lobe is clear. Within the LEFT lower lobe, spiculated nodule measures 10 mm (image 69, series 5) not changed. No new pulmonary nodularity. Upper Abdomen: Limited view of the liver, kidneys, pancreas are unremarkable. Normal adrenal glands. Musculoskeletal: No aggressive osseous lesion. IMPRESSION: 1. Stable peripheral consolidation in the RIGHT upper lobe at lung cancer treatment site. 2. Stable reticulonodular  thickening at the RIGHT lung apex. 3. Stable LEFT lower lobe pulmonary nodule. Electronically Signed   By: Suzy Bouchard M.D.   On: 01/08/2017 16:46   Impression:  1. Stage IA, squamous cell carcinoma of the right upper lobe lung cancer.  The patient appears to be radiographically stable since her last imaging study. We discussed the role of continued surveillance with CT imaging in 6 months time. I again reviewed with the patient that if at any point she would not consider radiation again, or if she decided that going for scans was too difficult, we could reassess R goals for her care however she remains interested in continuing to be followed in surveillance, which I think is appropriate again provided that she would accept treatment if another abnormality was identified which she says she would. She does declare that she would not consider chemotherapy however. She will contact us sooner if she has questions otherwise we will plan to see her back after her next CT scan. 2. Questions on living well. She was provided documentation for having legal living will drawn up, and this could be noted.  [place]. She is also going to meet with her personal attorney. Her interest in making updated changes to her power of attorney documentation  In a visit lasting 30 minutes, greater than 50% of the time was spent face to face discussing the patient's scans reviewing the images, and coordinating the patient's care.    Carola Rhine, PAC

## 2017-01-12 NOTE — Addendum Note (Signed)
Encounter addended by: Hayden Pedro, PA-C on: 01/12/2017  3:08 PM<BR>    Actions taken: Visit Navigator Flowsheet section accepted

## 2017-02-06 ENCOUNTER — Other Ambulatory Visit: Payer: Self-pay

## 2017-02-06 ENCOUNTER — Emergency Department (HOSPITAL_COMMUNITY): Payer: Medicare Other

## 2017-02-06 ENCOUNTER — Encounter (HOSPITAL_COMMUNITY): Payer: Self-pay | Admitting: Emergency Medicine

## 2017-02-06 ENCOUNTER — Emergency Department (HOSPITAL_COMMUNITY)
Admission: EM | Admit: 2017-02-06 | Discharge: 2017-02-06 | Disposition: A | Discharge status: Home / Self Care | Payer: Medicare Other | Attending: Emergency Medicine | Admitting: Emergency Medicine

## 2017-02-06 DIAGNOSIS — J209 Acute bronchitis, unspecified: Secondary | ICD-10-CM | POA: Diagnosis not present

## 2017-02-06 DIAGNOSIS — Z85118 Personal history of other malignant neoplasm of bronchus and lung: Secondary | ICD-10-CM | POA: Insufficient documentation

## 2017-02-06 DIAGNOSIS — Z79899 Other long term (current) drug therapy: Secondary | ICD-10-CM | POA: Diagnosis not present

## 2017-02-06 DIAGNOSIS — J449 Chronic obstructive pulmonary disease, unspecified: Secondary | ICD-10-CM | POA: Insufficient documentation

## 2017-02-06 DIAGNOSIS — R05 Cough: Secondary | ICD-10-CM | POA: Diagnosis present

## 2017-02-06 DIAGNOSIS — Z91040 Latex allergy status: Secondary | ICD-10-CM | POA: Diagnosis not present

## 2017-02-06 DIAGNOSIS — Z87891 Personal history of nicotine dependence: Secondary | ICD-10-CM | POA: Diagnosis not present

## 2017-02-06 LAB — BASIC METABOLIC PANEL
Anion gap: 12 (ref 5–15)
BUN: 21 mg/dL — ABNORMAL HIGH (ref 6–20)
CO2: 22 mmol/L (ref 22–32)
Calcium: 9.1 mg/dL (ref 8.9–10.3)
Chloride: 104 mmol/L (ref 101–111)
Creatinine, Ser: 1.15 mg/dL — ABNORMAL HIGH (ref 0.44–1.00)
GFR calc Af Amer: 49 mL/min — ABNORMAL LOW (ref >60)
GFR calc non Af Amer: 42 mL/min — ABNORMAL LOW (ref >60)
Glucose, Bld: 96 mg/dL (ref 65–99)
Potassium: 3.9 mmol/L (ref 3.5–5.1)
Sodium: 138 mmol/L (ref 135–145)

## 2017-02-06 LAB — CBC
HCT: 38.1 % (ref 36.0–46.0)
Hemoglobin: 12.7 g/dL (ref 12.0–15.0)
MCH: 33.1 pg (ref 26.0–34.0)
MCHC: 33.3 g/dL (ref 30.0–36.0)
MCV: 99.2 fL (ref 78.0–100.0)
Platelets: 257 10*3/uL (ref 150–400)
RBC: 3.84 MIL/uL — ABNORMAL LOW (ref 3.87–5.11)
RDW: 13.4 % (ref 11.5–15.5)
WBC: 11 10*3/uL — ABNORMAL HIGH (ref 4.0–10.5)

## 2017-02-06 LAB — POCT I-STAT TROPONIN I: Troponin i, poc: 0.02 ng/mL (ref 0.00–0.08)

## 2017-02-06 MED ORDER — PREDNISONE 10 MG PO TABS: ORAL_TABLET | ORAL | 0 refills | Status: DC

## 2017-02-06 MED ORDER — LEVOFLOXACIN 25 MG/ML PO SOLN: 750.0000 mg | Freq: Every day | ORAL | 0 refills | Status: AC

## 2017-02-06 MED ORDER — DEXTROSE 5 % IV SOLN
500.0000 mg | Freq: Once | INTRAVENOUS | Status: AC
  Administered 2017-02-06: 500 mg via INTRAVENOUS
  Filled 2017-02-06: qty 500

## 2017-02-06 MED ORDER — AEROCHAMBER PLUS FLO-VU MEDIUM MISC
1.0000 | Freq: Once | Status: AC
  Administered 2017-02-06: 1
  Filled 2017-02-06: qty 1

## 2017-02-06 MED ORDER — ALBUTEROL SULFATE HFA 108 (90 BASE) MCG/ACT IN AERS
2.0000 | INHALATION_SPRAY | Freq: Once | RESPIRATORY_TRACT | Status: AC
  Administered 2017-02-06: 2 via RESPIRATORY_TRACT
  Filled 2017-02-06: qty 6.7

## 2017-02-06 MED ORDER — PREDNISONE 20 MG PO TABS
40.0000 mg | ORAL_TABLET | Freq: Once | ORAL | Status: AC
  Administered 2017-02-06: 40 mg via ORAL
  Filled 2017-02-06: qty 2

## 2017-02-06 MED ORDER — DEXTROSE 5 % IV SOLN
1.0000 g | Freq: Once | INTRAVENOUS | Status: AC
  Administered 2017-02-06: 1 g via INTRAVENOUS
  Filled 2017-02-06: qty 10

## 2017-02-06 MED ORDER — SODIUM CHLORIDE 0.9 % IV BOLUS (SEPSIS)
1000.0000 mL | Freq: Once | INTRAVENOUS | Status: AC
  Administered 2017-02-06: 1000 mL via INTRAVENOUS

## 2017-02-06 NOTE — Discharge Instructions (Signed)
Drink plenty of fluids.  Take the antibiotic as prescribed.  Return to the ER with any worsening cough, shortness of breath, or other concerning symptoms

## 2017-02-06 NOTE — ED Provider Notes (Signed)
[facility] DEPT Provider Note   CSN: [id number] Arrival date & time: 02/06/17  1548     History   Chief Complaint Chief Complaint  Patient presents with  . Cough    HPI [name] is a 83 y.o. female.  HPI   A 24-year-old female with past medical history of lung cancer here with persistent cough.  The patient states that she has had a persistently worsening, productive cough for 2 weeks.  Patient states that her symptoms started 2 weeks ago with mild cough.  She was seen at that time by her primary care physician, who diagnosed her with a viral illness.  She has been taking over-the-counter medications without significant improvement.  She has had associated mild fatigue and persistent cough.  She only feels short of breath when she is coughing and denies any chest pain or shortness of breath at rest.  Denies any hemoptysis.  She has not had any fevers and her appetite has been normal.  She does endorse feeling generalized weakness but has been able to ambulate at her baseline.  She does have multiple known sick contacts.  Past Medical History:  Diagnosis Date  . Cancer ([place])    lung ca  . COPD (chronic obstructive pulmonary disease) ([place])   . Hernia, umbilical   . Hip pain, right   . History of radiation therapy 05/02/12-05/04/12,&05/09/12   rul lung 54Gy/36fx  . HLD (hyperlipidemia)   . Lung mass   . Osteoporosis   . PAC (premature atrial contraction)   . RA (rheumatoid arthritis) ([place])   . Shingles     Patient Active Problem List   Diagnosis Date Noted  . 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 Primary osteoarthritis of both hands 06/09/2016  . Primary osteoarthritis of both feet 06/09/2016  . History of lung cancer 06/09/2016  . High risk medication use 06/09/2016  . Aortic calcification ([place]) 05/10/2013  . Coronary artery calcification 05/10/2013  . Protein-calorie malnutrition, severe ([place]) 12/25/2012  . Depression 12/25/2012  .  Anemia of chronic disease 12/24/2012  . Thrombocytopenia, unspecified 12/24/2012  . Aphthous stomatitis 12/24/2012  . Bleeding from nasopharynx 12/24/2012  . Cellulitis 12/23/2012  . Hemoptysis? Secondary to either irritation by dentures, vs. secondary to lung neoplasm 12/23/2012  . Rheumatoid arthritis-severe 12/23/2012  . COPD, mild ([place]) 12/23/2012  . Osteoporosis 12/23/2012  . Primary cancer of right upper lobe of lung ([place]) 03/21/2012    Past Surgical History:  Procedure Laterality Date  . right lobectomy      OB History    No data available       Home Medications    Prior to Admission medications   Medication Sig Start Date End Date Taking? Authorizing Provider  Cholecalciferol (VITAMIN D-3) 1000 units CAPS Take 2 capsules by mouth daily.   Yes [provider]  folic acid (FOLVITE) 1 MG tablet Take 1 tablet (1 mg total) by mouth daily. 12/28/12  Yes Dellinger, Bobby Rumpf, PA-C  Multiple Vitamins-Minerals (MULTIVITAMIN & MINERAL PO) Take 1 tablet by mouth daily.   Yes [provider]  levofloxacin (LEVAQUIN) 25 MG/ML solution Take 30 mLs (750 mg total) by mouth daily. 02/06/17 02/11/17  Duffy Bruce, MD  predniSONE (DELTASONE) 10 MG tablet Take 4 tablets (40 mg) by mouth for 2 more days, then 2 tablets (20 mg) by mouth daily for 3 days, then 1 tablet (10 mg) by mouth daily for 3 days, then go back to your usual 5 mg. 02/06/17  Duffy Bruce, MD    Family History Family History  Problem Relation Age of Onset  . Cancer Daughter     Social History Social History  Substance Use Topics  . Smoking status: Former Smoker    Types: Cigarettes    Quit date: 01/11/2010  . Smokeless tobacco: Never Used  . Alcohol use No     Allergies   Latex; Boniva [ibandronic acid]; Darvocet [propoxyphene n-acetaminophen]; Erythromycin; Morphine and related; Oxycontin [oxycodone hcl]; Sulfa antibiotics; Tramadol; and Vicodin [hydrocodone-acetaminophen]   Review  of Systems Review of Systems  Constitutional: Positive for fatigue.  Respiratory: Positive for cough and shortness of breath (During coughing spells).   Neurological: Positive for weakness.  All other systems reviewed and are negative.    Physical Exam Updated Vital Signs BP (!) 154/78   Pulse 90   Temp 98 F (36.7 C) (Oral)   Resp 18   SpO2 100%   Physical Exam  Constitutional: She is oriented to person, place, and time. She appears well-developed and well-nourished. No distress.  HENT:  Head: Normocephalic and atraumatic.  Eyes: Conjunctivae are normal.  Neck: Neck supple.  Cardiovascular: Normal rate, regular rhythm and normal heart sounds.  Exam reveals no friction rub.   No murmur heard. Pulmonary/Chest: Effort normal. No tachypnea. No respiratory distress. She has no decreased breath sounds. She has no wheezes. She has rhonchi (Mild, clear with coughing). She has no rales.  Abdominal: She exhibits no distension.  Musculoskeletal: She exhibits no edema.  Neurological: She is alert and oriented to person, place, and time. She exhibits normal muscle tone.  Skin: Skin is warm. Capillary refill takes less than 2 seconds.  Psychiatric: She has a normal mood and affect.  Nursing note and vitals reviewed.    ED Treatments / Results  Labs (all labs ordered are listed, but only abnormal results are displayed) Labs Reviewed  BASIC METABOLIC PANEL - Abnormal; Notable for the following:       Result Value   BUN 21 (*)    Creatinine, Ser 1.15 (*)    GFR calc non Af Amer 42 (*)    GFR calc Af Amer 49 (*)    All other components within normal limits  CBC - Abnormal; Notable for the following:    WBC 11.0 (*)    RBC 3.84 (*)    All other components within normal limits  I-STAT TROPONIN, ED  POCT I-STAT TROPONIN I    EKG  EKG Interpretation  Date/Time:  Sunday February 06 2017 16:19:01 EDT Ventricular Rate:  80 PR Interval:    QRS Duration: 72 QT Interval:  381 QTC  Calculation: 440 R Axis:     Text Interpretation:  Sinus rhythm Atrial premature complex Confirmed by Tanna Furry [phone]) on 02/06/2017 5:59:02 PM       Radiology Dg Chest 2 View  Result Date: 02/06/2017 CLINICAL DATA:  Productive cough for 2 weeks. EXAM: CHEST  2 VIEW COMPARISON:  Chest x-ray May 16, 2015 FINDINGS: Postoperative changes are seen in the right upper lobe. Surgical sutures identified. Masslike opacity surrounding the sutures is similar compared to previous studies. No other acute abnormalities are identified. The heart, hila, and mediastinum are otherwise unchanged. IMPRESSION: Chronic post therapeutic changes in the right upper lobe. No acute abnormalities noted. Electronically Signed   By: Dorise Bullion III M.D   On: 02/06/2017 17:00    Procedures Procedures (including critical care time)  Medications Ordered in ED Medications  sodium chloride 0.9 %  bolus 1,000 mL (0 mLs Intravenous Stopped 02/06/17 2005)  cefTRIAXone (ROCEPHIN) 1 g in dextrose 5 % 50 mL IVPB (0 g Intravenous Stopped 02/06/17 1935)  azithromycin (ZITHROMAX) 500 mg in dextrose 5 % 250 mL IVPB (0 mg Intravenous Stopped 02/06/17 2036)  albuterol (PROVENTIL HFA;VENTOLIN HFA) 108 (90 Base) MCG/ACT inhaler 2 puff (2 puffs Inhalation Given 02/06/17 1908)  AEROCHAMBER PLUS FLO-VU MEDIUM MISC 1 each (1 each Other Given 02/06/17 1908)  predniSONE (DELTASONE) tablet 40 mg (40 mg Oral Given 02/06/17 2111)     Initial Impression / Assessment and Plan / ED Course  I have reviewed the triage vital signs and the nursing notes.  Pertinent labs & imaging results that were available during my care of the patient were reviewed by me and considered in my medical decision making (see chart for details).     83 yo F here with persistent productive cough x 2 weeks after likely viral URI. Suspect ongoing bronchitis versus early atypical PNA. No signs of infiltrate or PNA on CXR. Lab work c/w mild dehydration and pt  given fluids here. Trop neg, EKG non-ischemic, doubt ACS, CHF. No LE edema, sx started after classic prodrome of viral illness, with no CP, no pleurisy, doubt PE. She feels improved with breathing tx here and has h/o COPD per records. Will place on Levaquin, prednisone taper and d/c with close return precautions. Pt in agreement.  Final Clinical Impressions(s) / ED Diagnoses   Final diagnoses:  Acute bronchitis, unspecified organism    New Prescriptions Discharge Medication List as of 02/06/2017  8:37 PM    START taking these medications   Details  levofloxacin (LEVAQUIN) 25 MG/ML solution Take 30 mLs (750 mg total) by mouth daily., Starting Sun 02/06/2017, Until Fri 02/11/2017, Print         Duffy Bruce, MD 02/07/17 [phone]

## 2017-02-06 NOTE — ED Triage Notes (Addendum)
Pt reports she has had a productive cough for the past 2 weeks. Went to PCP for same a week ago and was diagnosed with viral infection. Pt reports she is not feeling any better. Pt is concerned she has walking PNA. Family reports pt has complained of SOB, however, pt speaking in complete sentences with no apparent difficulty. Pt also reports intermittent CP.

## 2017-03-08 ENCOUNTER — Other Ambulatory Visit: Payer: Self-pay | Admitting: Rheumatology

## 2017-03-08 NOTE — Telephone Encounter (Signed)
Last Visit: 11/12/16 Next Visit: 04/20/17  Okay to refill per Dr. Estanislado Pandy

## 2017-04-07 NOTE — Progress Notes (Signed)
Office Visit Note  Patient: [name]             Date of Birth: 03/17/1930           MRN: [id number]             PCP: Mayra Neer, MD Referring: Mayra Neer, MD Visit Date: 04/20/2017 Occupation: @GUAROCC @    Subjective:  Other (BIL hand pain, neck pain )   History of Present Illness: [name] is a 83 y.o. female with history of seronegative rheumatoid arthritis, osteoarthritis, and osteoporosis.  She states she has been experiencing increased neck pain. She denies any numbness, tingling, or radiation of pain.  She states that she has been coughing a lot, which she thinks caused her neck to start hurting.  She has limited ROM.  She states she continues to have bilateral hand pain and intermittent swelling.  She states she has a new nodule on her right chest wall.  She states she continues to take Prednisone 5 mg daily.  She is also taking Calcium and Vitamin D daily.  She states her left knee occasionally hurts and swells.    She states she was recently restarted on Lasix due to significant pedal edema.  She states the edema has been improving slowly.    Activities of Daily Living:  Patient reports morning stiffness for 0 minutes.   Patient Reports nocturnal pain.  Difficulty dressing/grooming: Denies Difficulty climbing stairs: Denies Difficulty getting out of chair: Denies Difficulty using hands for taps, buttons, cutlery, and/or writing: Reports   Review of Systems  Constitutional: Negative for fatigue and weakness.  HENT: Negative for mouth sores, mouth dryness and nose dryness.   Eyes: Negative for redness and dryness.  Respiratory: Negative for cough, hemoptysis, shortness of breath and difficulty breathing.   Cardiovascular: Negative for chest pain, palpitations, hypertension and swelling in legs/feet.  Gastrointestinal: Negative for blood in stool, constipation and diarrhea.  Endocrine: Positive for increased urination (On Lasix).  Genitourinary:  Negative for painful urination.  Musculoskeletal: Positive for arthralgias, joint pain and joint swelling. Negative for myalgias, muscle weakness, morning stiffness, muscle tenderness and myalgias.  Skin: Positive for nodules/bumps. Negative for color change, pallor, rash, hair loss, redness, skin tightness, ulcers and sensitivity to sunlight.  Neurological: Negative for dizziness, numbness and headaches.  Psychiatric/Behavioral: Negative.  Negative for depressed mood and sleep disturbance. The patient is not nervous/anxious.     PMFS History:  Patient Active Problem List   Diagnosis Date Noted  . 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 Primary osteoarthritis of both hands 06/09/2016  . Primary osteoarthritis of both feet 06/09/2016  . History of lung cancer 06/09/2016  . High risk medication use 06/09/2016  . Aortic calcification ([place]) 05/10/2013  . Coronary artery calcification 05/10/2013  . Protein-calorie malnutrition, severe ([place]) 12/25/2012  . Depression 12/25/2012  . Anemia of chronic disease 12/24/2012  . Thrombocytopenia, unspecified 12/24/2012  . Aphthous stomatitis 12/24/2012  . Bleeding from nasopharynx 12/24/2012  . Cellulitis 12/23/2012  . Hemoptysis? Secondary to either irritation by dentures, vs. secondary to lung neoplasm 12/23/2012  . Rheumatoid arthritis-severe 12/23/2012  . COPD, mild ([place]) 12/23/2012  . Osteoporosis 12/23/2012  . Primary cancer of right upper lobe of lung ([place]) 03/21/2012    Past Medical History:  Diagnosis Date  . Cancer ([place])    lung ca  . COPD (chronic obstructive pulmonary disease) ([place])   . Hernia, umbilical   . Hip pain, right   . History of radiation therapy  05/02/12-05/04/12,&05/09/12   rul lung 54Gy/14fx  . HLD (hyperlipidemia)   . Lung mass   . Osteoporosis   . PAC (premature atrial contraction)   . RA (rheumatoid arthritis) ([place])   . Shingles     Family History  Problem Relation Age of Onset  . Cancer Daughter    Past  Surgical History:  Procedure Laterality Date  . right lobectomy     Social History   Social History Narrative   Lives in ALF at [facility]     Objective: Vital Signs: BP 138/74 (BP Location: Left Arm, Patient Position: Sitting, Cuff Size: Normal)   Pulse 78   Resp 18   Ht 4\' 11"  (1.499 m)   Wt 102 lb (46.3 kg)   BMI 20.60 kg/m    Physical Exam  Constitutional: She is oriented to person, place, and time. She appears well-developed and well-nourished.  HENT:  Head: Normocephalic and atraumatic.  Eyes: Conjunctivae and EOM are normal.  Neck: Normal range of motion.  Cardiovascular: Normal rate, regular rhythm, normal heart sounds and intact distal pulses.  Pulmonary/Chest: Effort normal and breath sounds normal.  Abdominal: Soft. Bowel sounds are normal.  Musculoskeletal: She exhibits edema.  Lymphadenopathy:    She has no cervical adenopathy.  Neurological: She is alert and oriented to person, place, and time.  Skin: Skin is warm and dry. Capillary refill takes less than 2 seconds.  Psychiatric: She has a normal mood and affect. Her behavior is normal.  Nursing note and vitals reviewed.    Musculoskeletal Exam: Very limited C-spine ROM. Thoracic kyphosis noted. Shoulder joints and elbow joints full ROM. Limited wrist flexion and extension.  Subluxation and ulnar deviation of all MCPs.  Right 2nd MCP and 3rd PIP synovitis.  Left 4th MCP synovitis. Hip joints, knee joints, and ankle joints good ROM.  She has pedal edema bilaterally.  No midline spinal tenderness.    CDAI Exam: CDAI Homunculus Exam:   Tenderness:  Right hand: 2nd MCP and 3rd PIP Left hand: 4th MCP  Joint Counts:  CDAI Tender Joint count: 3 CDAI Swollen Joint count: 0  Global Assessments:  Patient Global Assessment: 8 Provider Global Assessment: 8  CDAI Calculated Score: 19    Investigation: No additional findings.   Imaging: Xr Cervical Spine Complete  Result Date: 04/20/2017 Multilevel  spondylosis.  Most significant narrowing of C5-C6 and C6-C7.  No cervical subluxation. Facet joint arthropathy noted. Impression: Degenerative disc disease of c-spine and facet joint arthropathy.     Speciality Comments: No specialty comments available.    Procedures:  No procedures performed Allergies: Latex; Boniva [ibandronic acid]; Darvocet [propoxyphene n-acetaminophen]; Erythromycin; Morphine and related; Oxycontin [oxycodone hcl]; Sulfa antibiotics; Tramadol; and Vicodin [hydrocodone-acetaminophen]   Assessment / Plan:     Visit Diagnoses: Rheumatoid arthritis of multiple sites with negative rheumatoid factor (HCC) - severe disease with nodulosis.  She continues to take Prednisone 5 mg daily.  She does not want to start any new medications at this time. XRs performed in March 2018 revealed erosive disease and subluxations.  She is having neck pain.  A XR of C-spine was performed today.       Rheumatoid nodulosis (HCC)  High risk medication use - long term prednisone 5 mg.  She still does not want to use Plaquenil or sulfasalazine  Neck pain - She has limited rotation.  She has had severe pain with motion.  A XR of her C-spine was performed today.  The XR revealed multilevel spondylosis  and facet joint arthropathy and DDD in C5-C6 and C6-C7. Plan: XR Cervical Spine Complete. The x-ray findings were reviewed. We offered physical therapy but patient declined. A handout on C-spine exercises was given.  Primary osteoarthritis of both hands: She continues to have discomfort of her hands. Joint protection and muscle strengthening were discussed.    Primary osteoarthritis of both feet: Denies any pain.  She is followed by a podiatrist.  She is wearing proper fitting shoes.   Age-related osteoporosis without current pathological fracture -  She declines starting an osteoporosis medication.  She continues to take calcium and vitamin D.  History of lung cancer - Unable to use methotrexate or  Biologics  History of COPD    Orders: Orders Placed This Encounter  Procedures  . XR Cervical Spine Complete   No orders of the defined types were placed in this encounter.   Face-to-face time spent with patient was 30 minutes. Greater than 50% of time was spent in counseling and coordination of care.  Follow-Up Instructions: Return in about 6 months (around 10/18/2017) for Rheumatoid arthritis, Osteoarthritis.  Bo Merino, MD  Note - This record has been created using Editor, commissioning.  Chart creation errors have been sought, but may not always  have been located. Such creation errors do not reflect on  the standard of medical care.

## 2017-04-20 ENCOUNTER — Ambulatory Visit (INDEPENDENT_AMBULATORY_CARE_PROVIDER_SITE_OTHER): Payer: Self-pay

## 2017-04-20 ENCOUNTER — Encounter: Payer: Self-pay | Admitting: Rheumatology

## 2017-04-20 ENCOUNTER — Ambulatory Visit (INDEPENDENT_AMBULATORY_CARE_PROVIDER_SITE_OTHER): Payer: Medicare Other | Admitting: Rheumatology

## 2017-04-20 VITALS — BP 138/74 | HR 78 | Resp 18 | Ht 59.0 in | Wt 102.0 lb

## 2017-04-20 DIAGNOSIS — Z85118 Personal history of other malignant neoplasm of bronchus and lung: Secondary | ICD-10-CM

## 2017-04-20 DIAGNOSIS — M0630 Rheumatoid nodule, unspecified site: Secondary | ICD-10-CM | POA: Diagnosis not present

## 2017-04-20 DIAGNOSIS — M19041 Primary osteoarthritis, right hand: Secondary | ICD-10-CM | POA: Diagnosis not present

## 2017-04-20 DIAGNOSIS — M19072 Primary osteoarthritis, left ankle and foot: Secondary | ICD-10-CM | POA: Diagnosis not present

## 2017-04-20 DIAGNOSIS — Z79899 Other long term (current) drug therapy: Secondary | ICD-10-CM

## 2017-04-20 DIAGNOSIS — Z8709 Personal history of other diseases of the respiratory system: Secondary | ICD-10-CM

## 2017-04-20 DIAGNOSIS — M19071 Primary osteoarthritis, right ankle and foot: Secondary | ICD-10-CM

## 2017-04-20 DIAGNOSIS — M542 Cervicalgia: Secondary | ICD-10-CM

## 2017-04-20 DIAGNOSIS — M0609 Rheumatoid arthritis without rheumatoid factor, multiple sites: Secondary | ICD-10-CM

## 2017-04-20 DIAGNOSIS — M19042 Primary osteoarthritis, left hand: Secondary | ICD-10-CM

## 2017-04-20 DIAGNOSIS — M810 Age-related osteoporosis without current pathological fracture: Secondary | ICD-10-CM

## 2017-04-20 NOTE — Patient Instructions (Signed)
Neck Exercises Neck exercises can be important for many reasons:  They can help you to improve and maintain flexibility in your neck. This can be especially important as you age.  They can help to make your neck stronger. This can make movement easier.  They can reduce or prevent neck pain.  They may help your upper back.  Ask your health care provider which neck exercises would be best for you. Exercises Neck Press Repeat this exercise 10 times. Do it first thing in the morning and right before bed or as told by your health care provider. 1. Lie on your back on a firm bed or on the floor with a pillow under your head. 2. Use your neck muscles to push your head down on the pillow and straighten your spine. 3. Hold the position as well as you can. Keep your head facing up and your chin tucked. 4. Slowly count to 5 while holding this position. 5. Relax for a few seconds. Then repeat.  Isometric Strengthening Do a full set of these exercises 2 times a day or as told by your health care provider. 1. Sit in a supportive chair and place your hand on your forehead. 2. Push forward with your head and neck while pushing back with your hand. Hold for 10 seconds. 3. Relax. Then repeat the exercise 3 times. 4. Next, do thesequence again, this time putting your hand against the back of your head. Use your head and neck to push backward against the hand pressure. 5. Finally, do the same exercise on either side of your head, pushing sideways against the pressure of your hand.  Prone Head Lifts Repeat this exercise 5 times. Do this 2 times a day or as told by your health care provider. 1. Lie face-down, resting on your elbows so that your chest and upper back are raised. 2. Start with your head facing downward, near your chest. Position your chin either on or near your chest. 3. Slowly lift your head upward. Lift until you are looking straight ahead. Then continue lifting your head as far back as  you can stretch. 4. Hold your head up for 5 seconds. Then slowly lower it to your starting position.  Supine Head Lifts Repeat this exercise 8-10 times. Do this 2 times a day or as told by your health care provider. 1. Lie on your back, bending your knees to point to the ceiling and keeping your feet flat on the floor. 2. Lift your head slowly off the floor, raising your chin toward your chest. 3. Hold for 5 seconds. 4. Relax and repeat.  Scapular Retraction Repeat this exercise 5 times. Do this 2 times a day or as told by your health care provider. 1. Stand with your arms at your sides. Look straight ahead. 2. Slowly pull both shoulders backward and downward until you feel a stretch between your shoulder blades in your upper back. 3. Hold for 10-30 seconds. 4. Relax and repeat.  Contact a health care provider if:  Your neck pain or discomfort gets much worse when you do an exercise.  Your neck pain or discomfort does not improve within 2 hours after you exercise. If you have any of these problems, stop exercising right away. Do not do the exercises again unless your health care provider says that you can. Get help right away if:  You develop sudden, severe neck pain. If this happens, stop exercising right away. Do not do the exercises again unless your   health care provider says that you can. Exercises Neck Stretch  Repeat this exercise 3-5 times. 1. Do this exercise while standing or while sitting in a chair. 2. Place your feet flat on the floor, shoulder-width apart. 3. Slowly turn your head to the right. Turn it all the way to the right so you can look over your right shoulder. Do not tilt or tip your head. 4. Hold this position for 10-30 seconds. 5. Slowly turn your head to the left, to look over your left shoulder. 6. Hold this position for 10-30 seconds.  Neck Retraction Repeat this exercise 8-10 times. Do this 3-4 times a day or as told by your health care  provider. 1. Do this exercise while standing or while sitting in a sturdy chair. 2. Look straight ahead. Do not bend your neck. 3. Use your fingers to push your chin backward. Do not bend your neck for this movement. Continue to face straight ahead. If you are doing the exercise properly, you will feel a slight sensation in your throat and a stretch at the back of your neck. 4. Hold the stretch for 1-2 seconds. Relax and repeat.  This information is not intended to replace advice given to you by your health care provider. Make sure you discuss any questions you have with your health care provider. Document Released: 03/17/2015 Document Revised: 09/11/2015 Document Reviewed: 10/14/2014 Elsevier Interactive Patient Education  2018 [employer]vier Inc.  

## 2017-06-01 ENCOUNTER — Other Ambulatory Visit: Payer: Self-pay | Admitting: Rheumatology

## 2017-06-01 NOTE — Telephone Encounter (Signed)
Last Visit: 04/20/17 Next Visit: 11/01/17  Okay to refill per Dr. Estanislado Pandy

## 2017-06-14 ENCOUNTER — Telehealth: Payer: Self-pay | Admitting: Rheumatology

## 2017-06-14 NOTE — Telephone Encounter (Signed)
Attempted to contact the patient and left message for patient to call the office.  

## 2017-06-14 NOTE — Telephone Encounter (Signed)
Patient called stating that when she got home from her appointment she noticed that Levofloxacin was listed on her medication list.  Patient requested that this be removed because she no longer takes that medication and does not want it listed anymore.

## 2017-06-29 ENCOUNTER — Telehealth: Payer: Self-pay | Admitting: *Deleted

## 2017-06-29 NOTE — Telephone Encounter (Signed)
CALLED PATIENT'S DAUGHTER Z. MAXEY TO INFORM OF LAB ON 07-12-17 AND HER CT , TEST TO BE @ [facility] , PT. TO  BE NPO- 4 HRS. PRIOR TO THIS TEST, PT. TO FOLLOW-UP WITH ALISON PERKINS ON 08-08-17, PATIENT'S DAUGHTER AGREED TO THESE APPTS.

## 2017-06-29 NOTE — Telephone Encounter (Signed)
XXXX 

## 2017-07-12 ENCOUNTER — Ambulatory Visit
Admission: RE | Admit: 2017-07-12 | Discharge: 2017-07-12 | Disposition: A | Discharge status: Home / Self Care | Payer: Medicare Other | Source: Ambulatory Visit | Attending: Radiation Oncology | Admitting: Radiation Oncology

## 2017-07-12 ENCOUNTER — Ambulatory Visit (HOSPITAL_COMMUNITY)
Admission: RE | Admit: 2017-07-12 | Discharge: 2017-07-12 | Disposition: A | Discharge status: Home / Self Care | Payer: Medicare Other | Source: Ambulatory Visit | Attending: Radiation Oncology | Admitting: Radiation Oncology

## 2017-07-12 DIAGNOSIS — C3411 Malignant neoplasm of upper lobe, right bronchus or lung: Secondary | ICD-10-CM | POA: Insufficient documentation

## 2017-07-12 LAB — COMPREHENSIVE METABOLIC PANEL
ALT: 11 U/L (ref 0–55)
AST: 15 U/L (ref 5–34)
Albumin: 3.4 g/dL — ABNORMAL LOW (ref 3.5–5.0)
Alkaline Phosphatase: 66 U/L (ref 40–150)
Anion gap: 9 (ref 3–11)
BUN: 23 mg/dL (ref 7–26)
CO2: 24 mmol/L (ref 22–29)
Calcium: 9.1 mg/dL (ref 8.4–10.4)
Chloride: 107 mmol/L (ref 98–109)
Creatinine, Ser: 1.02 mg/dL (ref 0.60–1.10)
GFR calc Af Amer: 56 mL/min — ABNORMAL LOW (ref >60)
GFR calc non Af Amer: 49 mL/min — ABNORMAL LOW (ref >60)
Glucose, Bld: 123 mg/dL (ref 70–140)
Potassium: 4.3 mmol/L (ref 3.5–5.1)
Sodium: 140 mmol/L (ref 136–145)
Total Bilirubin: 0.8 mg/dL (ref 0.2–1.2)
Total Protein: 6.7 g/dL (ref 6.4–8.3)

## 2017-07-12 MED ORDER — IOPAMIDOL (ISOVUE-300) INJECTION 61%
INTRAVENOUS | Status: AC
  Administered 2017-07-12: 75 mL
  Filled 2017-07-12: qty 75

## 2017-07-15 ENCOUNTER — Telehealth: Payer: Self-pay | Admitting: Rheumatology

## 2017-07-15 NOTE — Telephone Encounter (Signed)
Patient called stating that she accidentally dropped some of her Prednisone pills down the kitchen sink.  Patient states she is okay for now but wanted the office to know.

## 2017-07-19 ENCOUNTER — Telehealth: Payer: Self-pay | Admitting: Radiation Oncology

## 2017-07-19 DIAGNOSIS — C3411 Malignant neoplasm of upper lobe, right bronchus or lung: Secondary | ICD-10-CM

## 2017-07-19 NOTE — Telephone Encounter (Signed)
I spoke with the patient and reviewed her recent CT. We will plan to proceed with repeat CT in 6 months' time and see her back to review at that time.

## 2017-07-28 ENCOUNTER — Other Ambulatory Visit: Payer: Self-pay | Admitting: Rheumatology

## 2017-07-28 ENCOUNTER — Telehealth: Payer: Self-pay | Admitting: Rheumatology

## 2017-07-28 NOTE — Telephone Encounter (Signed)
Patient states she has 4 tablets left from a refill on 06/01/2017 where she received 100 tablets. Patient dropped them down the sink but is unaware of how many she actually dropped. I discussed the situation with Dr. Estanislado Pandy.  Okay to refill 30 day supply per Dr. Estanislado Pandy. We will send in 30 tablets at a time now and patient is aware and verbalized understanding. Prescription has been sent to the pharmacy.

## 2017-07-28 NOTE — Telephone Encounter (Signed)
Last visit: 04/20/2017 Next visit: 11/01/2017  Okay to refill a 30 day supply, per Dr. Estanislado Pandy. (send in 30 day supplies from now on)

## 2017-07-28 NOTE — Telephone Encounter (Signed)
Patient called requesting prescription refill of Prednisone.  Patient states she accidentally dropped a few pills down the kitchen sink 2 weeks ago so she is not actually due to refill the prescription yet but has no more pills remaining.  Patient's pharmacy is [facility].

## 2017-07-28 NOTE — Telephone Encounter (Signed)
Attempted to call patient and left message on machine for patient to call the office.  

## 2017-08-08 ENCOUNTER — Ambulatory Visit: Payer: Self-pay | Admitting: Radiation Oncology

## 2017-08-15 ENCOUNTER — Telehealth: Payer: Self-pay | Admitting: Rheumatology

## 2017-08-15 NOTE — Telephone Encounter (Signed)
Patient called stating she changed her pharmacy from [facility] to [facility] on [address]

## 2017-08-15 NOTE — Telephone Encounter (Signed)
Documented in patients chart.

## 2017-08-17 DIAGNOSIS — I219 Acute myocardial infarction, unspecified: Secondary | ICD-10-CM

## 2017-08-17 HISTORY — DX: Acute myocardial infarction, unspecified: I21.9

## 2017-08-24 ENCOUNTER — Emergency Department (HOSPITAL_COMMUNITY): Payer: Medicare Other

## 2017-08-24 ENCOUNTER — Inpatient Hospital Stay (HOSPITAL_COMMUNITY)
Admission: EM | Admit: 2017-08-24 | Discharge: 2017-08-29 | DRG: 282 | Disposition: A | Discharge status: Skilled Nursing Facility | Payer: Medicare Other | Attending: Cardiovascular Disease | Admitting: Cardiovascular Disease

## 2017-08-24 ENCOUNTER — Encounter (HOSPITAL_COMMUNITY): Payer: Self-pay

## 2017-08-24 DIAGNOSIS — I77819 Aortic ectasia, unspecified site: Secondary | ICD-10-CM | POA: Diagnosis present

## 2017-08-24 DIAGNOSIS — Z888 Allergy status to other drugs, medicaments and biological substances status: Secondary | ICD-10-CM

## 2017-08-24 DIAGNOSIS — R918 Other nonspecific abnormal finding of lung field: Secondary | ICD-10-CM | POA: Diagnosis present

## 2017-08-24 DIAGNOSIS — Z882 Allergy status to sulfonamides status: Secondary | ICD-10-CM

## 2017-08-24 DIAGNOSIS — R600 Localized edema: Secondary | ICD-10-CM | POA: Diagnosis present

## 2017-08-24 DIAGNOSIS — Z85118 Personal history of other malignant neoplasm of bronchus and lung: Secondary | ICD-10-CM

## 2017-08-24 DIAGNOSIS — I700 Atherosclerosis of aorta: Secondary | ICD-10-CM | POA: Diagnosis present

## 2017-08-24 DIAGNOSIS — Z885 Allergy status to narcotic agent status: Secondary | ICD-10-CM

## 2017-08-24 DIAGNOSIS — I4581 Long QT syndrome: Secondary | ICD-10-CM | POA: Diagnosis present

## 2017-08-24 DIAGNOSIS — E785 Hyperlipidemia, unspecified: Secondary | ICD-10-CM | POA: Diagnosis present

## 2017-08-24 DIAGNOSIS — N183 Chronic kidney disease, stage 3 (moderate): Secondary | ICD-10-CM | POA: Diagnosis present

## 2017-08-24 DIAGNOSIS — D649 Anemia, unspecified: Secondary | ICD-10-CM | POA: Diagnosis present

## 2017-08-24 DIAGNOSIS — Z91040 Latex allergy status: Secondary | ICD-10-CM

## 2017-08-24 DIAGNOSIS — I214 Non-ST elevation (NSTEMI) myocardial infarction: Principal | ICD-10-CM | POA: Diagnosis present

## 2017-08-24 DIAGNOSIS — I080 Rheumatic disorders of both mitral and aortic valves: Secondary | ICD-10-CM | POA: Diagnosis present

## 2017-08-24 DIAGNOSIS — R195 Other fecal abnormalities: Secondary | ICD-10-CM | POA: Diagnosis present

## 2017-08-24 DIAGNOSIS — J449 Chronic obstructive pulmonary disease, unspecified: Secondary | ICD-10-CM | POA: Diagnosis present

## 2017-08-24 DIAGNOSIS — F329 Major depressive disorder, single episode, unspecified: Secondary | ICD-10-CM | POA: Diagnosis present

## 2017-08-24 DIAGNOSIS — M810 Age-related osteoporosis without current pathological fracture: Secondary | ICD-10-CM | POA: Diagnosis present

## 2017-08-24 DIAGNOSIS — Z881 Allergy status to other antibiotic agents status: Secondary | ICD-10-CM

## 2017-08-24 DIAGNOSIS — Z7952 Long term (current) use of systemic steroids: Secondary | ICD-10-CM

## 2017-08-24 DIAGNOSIS — Z923 Personal history of irradiation: Secondary | ICD-10-CM

## 2017-08-24 DIAGNOSIS — Z87891 Personal history of nicotine dependence: Secondary | ICD-10-CM

## 2017-08-24 DIAGNOSIS — I2510 Atherosclerotic heart disease of native coronary artery without angina pectoris: Secondary | ICD-10-CM | POA: Diagnosis present

## 2017-08-24 DIAGNOSIS — M069 Rheumatoid arthritis, unspecified: Secondary | ICD-10-CM | POA: Diagnosis present

## 2017-08-24 DIAGNOSIS — Z79899 Other long term (current) drug therapy: Secondary | ICD-10-CM

## 2017-08-24 NOTE — ED Provider Notes (Signed)
[place] DEPT Provider Note   CSN: [id number] Arrival date & time: 08/24/17  2309     History   Chief Complaint Chief Complaint  Patient presents with  . Shortness of Breath    HPI [name] is a 84 y.o. female.  The history is provided by the patient and medical records.  Shortness of Breath  Associated symptoms include cough.    84 year old female with history of lung cancer currently in remission, COPD, hyperlipidemia, osteoporosis, rheumatoid arthritis, presenting to the ED for SOB and nausea that began this evening around 2200.  States she went to go lay down but then started feeling worse.  States she feels very SOB, slight cough, and nauseated.  Denies chest pain.  States she does take lasix, has not missed any doses.  No noted fevers.  She does have history of some coronary calcifications, however does not recall having a prior catheterization.  She was a former smoker.  Past Medical History:  Diagnosis Date  . Cancer ([place])    lung ca  . COPD (chronic obstructive pulmonary disease) ([place])   . Hernia, umbilical   . Hip pain, right   . History of radiation therapy 05/02/12-05/04/12,&05/09/12   rul lung 54Gy/72fx  . HLD (hyperlipidemia)   . Lung mass   . Osteoporosis   . PAC (premature atrial contraction)   . RA (rheumatoid arthritis) ([place])   . Shingles     Patient Active Problem List   Diagnosis Date Noted  . 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) 06/09/2016  . Primary osteoarthritis of both hands 06/09/2016  . Primary osteoarthritis of both feet 06/09/2016  . History of lung cancer 06/09/2016  . High risk medication use 06/09/2016  . Aortic calcification ([place]) 05/10/2013  . Coronary artery calcification 05/10/2013  . Protein-calorie malnutrition, severe ([place]) 12/25/2012  . Depression 12/25/2012  . Anemia of chronic disease 12/24/2012  . Thrombocytopenia, unspecified 12/24/2012  . Aphthous stomatitis 12/24/2012  . Bleeding from  nasopharynx 12/24/2012  . Cellulitis 12/23/2012  . Hemoptysis? Secondary to either irritation by dentures, vs. secondary to lung neoplasm 12/23/2012  . Rheumatoid arthritis-severe 12/23/2012  . COPD, mild ([place]) 12/23/2012  . Osteoporosis 12/23/2012  . Primary cancer of right upper lobe of lung ([place]) 03/21/2012    Past Surgical History:  Procedure Laterality Date  . right lobectomy       OB History   None      Home Medications    Prior to Admission medications   Medication Sig Start Date End Date Taking? Authorizing Provider  Cholecalciferol (VITAMIN D-3) 1000 units CAPS Take 2 capsules by mouth daily.    [provider]  folic acid (FOLVITE) 1 MG tablet Take 1 tablet (1 mg total) by mouth daily. 12/28/12   Dellinger, Bobby Rumpf, PA-C  furosemide (LASIX) 20 MG tablet daily. 03/31/17   [provider]  levofloxacin (LEVAQUIN) 750 MG tablet Take 750 mg by mouth daily. 02/07/17   [provider]  Multiple Vitamins-Minerals (MULTIVITAMIN & MINERAL PO) Take 1 tablet by mouth daily.    [provider]  predniSONE (DELTASONE) 5 MG tablet Take 1 tablet (5 mg total) by mouth daily with breakfast. 07/28/17   Bo Merino, MD    Family History Family History  Problem Relation Age of Onset  . Cancer Daughter     Social History Social History   Tobacco Use  . Smoking status: Former Smoker    Types: Cigarettes    Last attempt to quit: 01/11/2010  Years since quitting: 7.6  . Smokeless tobacco: Never Used  Substance Use Topics  . Alcohol use: No  . Drug use: No     Allergies   Latex; Boniva [ibandronic acid]; Darvocet [propoxyphene n-acetaminophen]; Erythromycin; Morphine and related; Oxycontin [oxycodone hcl]; Sulfa antibiotics; Tramadol; and Vicodin [hydrocodone-acetaminophen]   Review of Systems Review of Systems  Respiratory: Positive for cough and shortness of breath.   Gastrointestinal: Positive for nausea.  All other systems  reviewed and are negative.    Physical Exam Updated Vital Signs BP (!) 145/69 (BP Location: Right Arm)   Pulse 74   Temp 97.9 F (36.6 C) (Oral)   Resp 20   Ht 4\' 11"  (1.499 m)   Wt 44.5 kg (98 lb)   SpO2 98%   BMI 19.79 kg/m   Physical Exam  Constitutional: She is oriented to person, place, and time. She appears well-developed and well-nourished.  HENT:  Head: Normocephalic and atraumatic.  Mouth/Throat: Oropharynx is clear and moist.  Eyes: Pupils are equal, round, and reactive to light. Conjunctivae and EOM are normal.  Neck: Normal range of motion.  Cardiovascular: Normal rate, regular rhythm and normal heart sounds.  Pulmonary/Chest: Effort normal. She has no decreased breath sounds. She has rhonchi. She has no rales.  Abdominal: Soft. Bowel sounds are normal.  Musculoskeletal: Normal range of motion.  Neurological: She is alert and oriented to person, place, and time.  Skin: Skin is warm and dry.  Psychiatric: She has a normal mood and affect.  Nursing note and vitals reviewed.    ED Treatments / Results  Labs (all labs ordered are listed, but only abnormal results are displayed) Labs Reviewed  CBC WITH DIFFERENTIAL/PLATELET - Abnormal; Notable for the following components:      Result Value   RBC 3.48 (*)    Hemoglobin 11.3 (*)    HCT 34.5 (*)    All other components within normal limits  BASIC METABOLIC PANEL - Abnormal; Notable for the following components:   Chloride 100 (*)    Glucose, Bld 105 (*)    BUN 30 (*)    Creatinine, Ser 1.33 (*)    Calcium 8.7 (*)    GFR calc non Af Amer 35 (*)    GFR calc Af Amer 41 (*)    All other components within normal limits  BRAIN NATRIURETIC PEPTIDE - Abnormal; Notable for the following components:   B Natriuretic Peptide 211.0 (*)    All other components within normal limits  TROPONIN I - Abnormal; Notable for the following components:   Troponin I 1.01 (*)    All other components within normal limits  I-STAT  TROPONIN, ED - Abnormal; Notable for the following components:   Troponin i, poc 0.33 (*)    All other components within normal limits  PROTIME-INR  APTT  HEPARIN LEVEL (UNFRACTIONATED)    EKG EKG Interpretation  Date/Time:  Wednesday Aug 24 2017 23:28:43 EDT Ventricular Rate:  71 PR Interval:    QRS Duration: 84 QT Interval:  425 QTC Calculation: 462 R Axis:   -67 Text Interpretation:  Sinus rhythm Atrial premature complex Left anterior fascicular block Left ventricular hypertrophy Anterior Q waves, possibly due to LVH No significant change was found Confirmed by Jola Schmidt [phone]) on 08/25/2017 1:11:12 AM   Radiology Dg Chest 2 View  Result Date: 08/25/2017 CLINICAL DATA:  84 year old female with shortness of breath. History of right upper lobe carcinoma status post radiation treatment. EXAM: CHEST - 2 VIEW COMPARISON:  Chest CT dated 07/12/2017. FINDINGS: Focal opacity in the right upper lobe similar to prior CT consistent with radiation treatment. No new consolidative changes. There is no pleural effusion or pneumothorax. The cardiac silhouette is within normal limits. There is atherosclerotic calcification of the aorta. Osteopenia with degenerative changes and scoliosis of the spine. No acute osseous pathology. IMPRESSION: 1. No acute cardiopulmonary process. 2. Post treatment changes of the right upper lobe. Electronically Signed   By: Anner Crete M.D.   On: 08/25/2017 01:05   Ct Angio Chest Pe W And/or Wo Contrast  Result Date: 08/25/2017 CLINICAL DATA:  Dyspnea since 20/2 100 hours. Left upper lobe lung cancer with radiation and lobectomy. COPD. EXAM: CT ANGIOGRAPHY CHEST WITH CONTRAST TECHNIQUE: Multidetector CT imaging of the chest was performed using the standard protocol during bolus administration of intravenous contrast. Multiplanar CT image reconstructions and MIPs were obtained to evaluate the vascular anatomy. CONTRAST:  37mL ISOVUE-370 IOPAMIDOL (ISOVUE-370)  INJECTION 76% COMPARISON:  07/12/2017 and 10/24/2012 chest CT FINDINGS: Cardiovascular: Aortic and coronary arteriosclerosis. Borderline cardiomegaly without pericardial effusion. Ectatic thoracic aorta without aneurysm. Satisfactory opacification of the pulmonary arteries without pulmonary embolus. Mediastinum/Nodes: No mediastinal or hilar lymphadenopathy. Patent trachea and mainstem bronchi. Esophagus is unremarkable. Lungs/Pleura: Volume loss with new confluent area of atelectasis and bronchiectasis in the right upper lobe. Stable bibasilar scarring some which appears calcified in the left lower lobe. No pneumothorax. Stable nodularity in the medial right upper lobe adjacent to the major fissure measuring 7.2 x 5.9 mm, unchanged in appearance. Paraseptal adjacent emphysema is seen. Redemonstration of ovoid masslike opacity in the left lower lobe unchanged in appearance measuring 11.9 x 7.3 cm on series 6/60. No effusion or pneumothorax. Upper Abdomen: Splenic granulomas. No space-occupying mass of the liver nor included adrenal glands. Musculoskeletal: No aggressive nor suspicious osseous lesions. Review of the MIP images confirms the above findings. IMPRESSION: 1. Cardiomegaly with coronary arteriosclerosis. Ectatic aorta without aneurysm. No acute pulmonary embolus. 2. New confluent area of atelectasis with bronchiectasis in the right upper lobe. 3. Stable bibasilar scarring with ovoid pulmonary nodular opacity measuring 11.9 x 7.3 mm in the left lower lobe consistent with a benign finding given stability dating back to 2014. Aortic Atherosclerosis (ICD10-I70.0). Electronically Signed   By: Ashley Royalty M.D.   On: 08/25/2017 03:02    Procedures Procedures (including critical care time)  CRITICAL CARE Performed by: Larene Pickett   Total critical care time: 45 minutes  Critical care time was exclusive of separately billable procedures and treating other patients.  Critical care was necessary to  treat or prevent imminent or life-threatening deterioration.  Critical care was time spent personally by me on the following activities: development of treatment plan with patient and/or surrogate as well as nursing, discussions with consultants, evaluation of patient's response to treatment, examination of patient, obtaining history from patient or surrogate, ordering and performing treatments and interventions, ordering and review of laboratory studies, ordering and review of radiographic studies, pulse oximetry and re-evaluation of patient's condition.   Medications Ordered in ED Medications - No data to display   Initial Impression / Assessment and Plan / ED Course  I have reviewed the triage vital signs and the nursing notes.  Pertinent labs & imaging results that were available during my care of the patient were reviewed by me and considered in my medical decision making (see chart for details).  84 year old female who with acute shortness of breath and nausea that began this evening.  Tried  to lay down at home but symptoms got worse so called EMS.  She was given nitroglycerin and Zofran with EMS.  She denies having any chest pain this evening or here in the ED.  EKG is nonischemic.  Breath sounds are somewhat junky, more pronounced in the right upper lobe.  Does have some trace edema the ankles.  Takes Lasix, has not missed any doses.  Denies any significant weight gain.  She is afebrile and nontoxic.  We will plan for screening labs, chest x-ray.  Labs with troponin of 1.01.  Full dose ASA given.  BNP mildly elevated at 211.  No significant edema noted on chest x-ray.  Patient does have history of lung cancer, no evidence of recurrence on last CT scan in March 2019.  CT Angie of the chest was obtained, no evidence of PE, focal infiltrates, or pulmonary edema.  She does have some bronchiectasis in the right upper lobe which may account for the rhonchi on exam.  Repeat EKG remains without any  acute ischemia.  Patient without any episodes of chest pain.  Now comfortable on RA.  Question is this SOB/nausea is her anginal equivalent.  Discussed with cardiology, patient will required admission for NSTEMI at [facility], awaiting bed assignment.  Heparin started.  Patient remains stable here.  Discussed possibility of needing cardiac cath, she is open to this.  Final Clinical Impressions(s) / ED Diagnoses   Final diagnoses:  NSTEMI (non-ST elevated myocardial infarction) [facility])    ED Discharge Orders    None       Larene Pickett, PA-C 08/25/17 0606    Jola Schmidt, MD 08/26/17 [phone]

## 2017-08-24 NOTE — ED Notes (Signed)
ED Provider at bedside. 

## 2017-08-24 NOTE — ED Triage Notes (Signed)
Per EMS:  Pt is from home with c/o of SOB and nausea around 2200.  Pt presents with pedal edema, rales initially, then rhonchi, and t-wave changes laterally in 12-lead.  Pt reports she takes lasix.  EMS gave her 4 mg of Zofran and 2 doses of sublingual nitro.

## 2017-08-25 ENCOUNTER — Other Ambulatory Visit: Payer: Self-pay

## 2017-08-25 ENCOUNTER — Emergency Department (HOSPITAL_COMMUNITY): Payer: Medicare Other

## 2017-08-25 ENCOUNTER — Encounter (HOSPITAL_COMMUNITY): Payer: Self-pay

## 2017-08-25 DIAGNOSIS — D649 Anemia, unspecified: Secondary | ICD-10-CM | POA: Diagnosis present

## 2017-08-25 DIAGNOSIS — I080 Rheumatic disorders of both mitral and aortic valves: Secondary | ICD-10-CM | POA: Diagnosis present

## 2017-08-25 DIAGNOSIS — Z85118 Personal history of other malignant neoplasm of bronchus and lung: Secondary | ICD-10-CM | POA: Diagnosis not present

## 2017-08-25 DIAGNOSIS — M069 Rheumatoid arthritis, unspecified: Secondary | ICD-10-CM | POA: Diagnosis present

## 2017-08-25 DIAGNOSIS — I700 Atherosclerosis of aorta: Secondary | ICD-10-CM | POA: Diagnosis present

## 2017-08-25 DIAGNOSIS — Z7952 Long term (current) use of systemic steroids: Secondary | ICD-10-CM | POA: Diagnosis not present

## 2017-08-25 DIAGNOSIS — Z923 Personal history of irradiation: Secondary | ICD-10-CM | POA: Diagnosis not present

## 2017-08-25 DIAGNOSIS — F329 Major depressive disorder, single episode, unspecified: Secondary | ICD-10-CM | POA: Diagnosis present

## 2017-08-25 DIAGNOSIS — I4581 Long QT syndrome: Secondary | ICD-10-CM | POA: Diagnosis present

## 2017-08-25 DIAGNOSIS — I2510 Atherosclerotic heart disease of native coronary artery without angina pectoris: Secondary | ICD-10-CM | POA: Diagnosis present

## 2017-08-25 DIAGNOSIS — Z882 Allergy status to sulfonamides status: Secondary | ICD-10-CM | POA: Diagnosis not present

## 2017-08-25 DIAGNOSIS — I214 Non-ST elevation (NSTEMI) myocardial infarction: Secondary | ICD-10-CM | POA: Diagnosis present

## 2017-08-25 DIAGNOSIS — N183 Chronic kidney disease, stage 3 (moderate): Secondary | ICD-10-CM | POA: Diagnosis present

## 2017-08-25 DIAGNOSIS — Z79899 Other long term (current) drug therapy: Secondary | ICD-10-CM | POA: Diagnosis not present

## 2017-08-25 DIAGNOSIS — E785 Hyperlipidemia, unspecified: Secondary | ICD-10-CM | POA: Diagnosis present

## 2017-08-25 DIAGNOSIS — Z885 Allergy status to narcotic agent status: Secondary | ICD-10-CM | POA: Diagnosis not present

## 2017-08-25 DIAGNOSIS — R600 Localized edema: Secondary | ICD-10-CM | POA: Diagnosis present

## 2017-08-25 DIAGNOSIS — M810 Age-related osteoporosis without current pathological fracture: Secondary | ICD-10-CM | POA: Diagnosis present

## 2017-08-25 DIAGNOSIS — R918 Other nonspecific abnormal finding of lung field: Secondary | ICD-10-CM | POA: Diagnosis present

## 2017-08-25 DIAGNOSIS — I77819 Aortic ectasia, unspecified site: Secondary | ICD-10-CM | POA: Diagnosis present

## 2017-08-25 DIAGNOSIS — J449 Chronic obstructive pulmonary disease, unspecified: Secondary | ICD-10-CM | POA: Diagnosis present

## 2017-08-25 DIAGNOSIS — R195 Other fecal abnormalities: Secondary | ICD-10-CM | POA: Diagnosis present

## 2017-08-25 DIAGNOSIS — Z87891 Personal history of nicotine dependence: Secondary | ICD-10-CM | POA: Diagnosis not present

## 2017-08-25 DIAGNOSIS — Z888 Allergy status to other drugs, medicaments and biological substances status: Secondary | ICD-10-CM | POA: Diagnosis not present

## 2017-08-25 DIAGNOSIS — R079 Chest pain, unspecified: Secondary | ICD-10-CM | POA: Diagnosis not present

## 2017-08-25 LAB — CBC WITH DIFFERENTIAL/PLATELET
Basophils Absolute: 0 10*3/uL (ref 0.0–0.1)
Basophils Relative: 0 %
Eosinophils Absolute: 0.1 10*3/uL (ref 0.0–0.7)
Eosinophils Relative: 1 %
HCT: 34.5 % — ABNORMAL LOW (ref 36.0–46.0)
Hemoglobin: 11.3 g/dL — ABNORMAL LOW (ref 12.0–15.0)
Lymphocytes Relative: 13 %
Lymphs Abs: 0.9 10*3/uL (ref 0.7–4.0)
MCH: 32.5 pg (ref 26.0–34.0)
MCHC: 32.8 g/dL (ref 30.0–36.0)
MCV: 99.1 fL (ref 78.0–100.0)
Monocytes Absolute: 0.7 10*3/uL (ref 0.1–1.0)
Monocytes Relative: 10 %
Neutro Abs: 5.5 10*3/uL (ref 1.7–7.7)
Neutrophils Relative %: 76 %
Platelets: 163 10*3/uL (ref 150–400)
RBC: 3.48 MIL/uL — ABNORMAL LOW (ref 3.87–5.11)
RDW: 14.1 % (ref 11.5–15.5)
WBC: 7.2 10*3/uL (ref 4.0–10.5)

## 2017-08-25 LAB — PROTIME-INR
INR: 1.12
Prothrombin Time: 14.3 seconds (ref 11.4–15.2)

## 2017-08-25 LAB — BASIC METABOLIC PANEL
Anion gap: 10 (ref 5–15)
BUN: 30 mg/dL — ABNORMAL HIGH (ref 6–20)
CO2: 26 mmol/L (ref 22–32)
Calcium: 8.7 mg/dL — ABNORMAL LOW (ref 8.9–10.3)
Chloride: 100 mmol/L — ABNORMAL LOW (ref 101–111)
Creatinine, Ser: 1.33 mg/dL — ABNORMAL HIGH (ref 0.44–1.00)
GFR calc Af Amer: 41 mL/min — ABNORMAL LOW (ref >60)
GFR calc non Af Amer: 35 mL/min — ABNORMAL LOW (ref >60)
Glucose, Bld: 105 mg/dL — ABNORMAL HIGH (ref 65–99)
Potassium: 4.9 mmol/L (ref 3.5–5.1)
Sodium: 136 mmol/L (ref 135–145)

## 2017-08-25 LAB — I-STAT TROPONIN, ED: Troponin i, poc: 0.33 ng/mL (ref 0.00–0.08)

## 2017-08-25 LAB — HEPARIN LEVEL (UNFRACTIONATED): Heparin Unfractionated: 0.43 IU/mL (ref 0.30–0.70)

## 2017-08-25 LAB — BRAIN NATRIURETIC PEPTIDE: B Natriuretic Peptide: 211 pg/mL — ABNORMAL HIGH (ref 0.0–100.0)

## 2017-08-25 LAB — MRSA PCR SCREENING: MRSA by PCR: NEGATIVE

## 2017-08-25 LAB — TROPONIN I
Troponin I: 1.01 ng/mL (ref <0.03)
Troponin I: 1.16 ng/mL (ref <0.03)

## 2017-08-25 LAB — APTT: aPTT: 25 seconds (ref 24–36)

## 2017-08-25 MED ORDER — SODIUM CHLORIDE 0.9 % IV SOLN: 250.0000 mL | INTRAVENOUS | Status: DC | PRN

## 2017-08-25 MED ORDER — TRAMADOL HCL 50 MG PO TABS: 50.0000 mg | ORAL_TABLET | Freq: Four times a day (QID) | ORAL | Status: DC | PRN

## 2017-08-25 MED ORDER — IOPAMIDOL (ISOVUE-370) INJECTION 76%
100.0000 mL | Freq: Once | INTRAVENOUS | Status: AC | PRN
  Administered 2017-08-25: 60 mL via INTRAVENOUS

## 2017-08-25 MED ORDER — SODIUM CHLORIDE 0.9% FLUSH: 3.0000 mL | INTRAVENOUS | Status: DC | PRN

## 2017-08-25 MED ORDER — SODIUM CHLORIDE 0.9% FLUSH
3.0000 mL | Freq: Two times a day (BID) | INTRAVENOUS | Status: DC
Start: 2017-08-25 — End: 2017-08-29
  Administered 2017-08-27 – 2017-08-29 (×4): 3 mL via INTRAVENOUS

## 2017-08-25 MED ORDER — ATORVASTATIN CALCIUM 80 MG PO TABS
80.0000 mg | ORAL_TABLET | Freq: Every day | ORAL | Status: DC
  Administered 2017-08-25 – 2017-08-29 (×5): 80 mg via ORAL
  Filled 2017-08-25 (×5): qty 1

## 2017-08-25 MED ORDER — SODIUM CHLORIDE 0.9 % WEIGHT BASED INFUSION: 1.0000 mL/kg/h | INTRAVENOUS | Status: DC

## 2017-08-25 MED ORDER — ACETAMINOPHEN 325 MG PO TABS: 650.0000 mg | ORAL_TABLET | ORAL | Status: DC | PRN

## 2017-08-25 MED ORDER — ALPRAZOLAM 0.25 MG PO TABS: 0.2500 mg | ORAL_TABLET | Freq: Three times a day (TID) | ORAL | Status: DC | PRN

## 2017-08-25 MED ORDER — SODIUM CHLORIDE 0.9% FLUSH: 3.0000 mL | Freq: Two times a day (BID) | INTRAVENOUS | Status: DC

## 2017-08-25 MED ORDER — HEPARIN (PORCINE) IN NACL 100-0.45 UNIT/ML-% IJ SOLN
650.0000 [IU]/h | INTRAMUSCULAR | Status: DC
  Administered 2017-08-25: 550 [IU]/h via INTRAVENOUS
  Filled 2017-08-25: qty 250

## 2017-08-25 MED ORDER — ZOLPIDEM TARTRATE 5 MG PO TABS: 5.0000 mg | ORAL_TABLET | Freq: Every evening | ORAL | Status: DC | PRN

## 2017-08-25 MED ORDER — ATORVASTATIN CALCIUM 80 MG PO TABS: 80.0000 mg | ORAL_TABLET | Freq: Every day | ORAL | Status: DC

## 2017-08-25 MED ORDER — ASPIRIN 81 MG PO CHEW
81.0000 mg | CHEWABLE_TABLET | ORAL | Status: AC
  Administered 2017-08-26: 81 mg via ORAL
  Filled 2017-08-25: qty 1

## 2017-08-25 MED ORDER — ASPIRIN 81 MG PO CHEW
324.0000 mg | CHEWABLE_TABLET | Freq: Once | ORAL | Status: AC
  Administered 2017-08-25: 324 mg via ORAL
  Filled 2017-08-25 (×2): qty 4

## 2017-08-25 MED ORDER — FUROSEMIDE 20 MG PO TABS
20.0000 mg | ORAL_TABLET | Freq: Every day | ORAL | Status: DC
  Administered 2017-08-25 – 2017-08-26 (×2): 20 mg via ORAL
  Filled 2017-08-25 (×2): qty 1

## 2017-08-25 MED ORDER — ONDANSETRON HCL 4 MG/2ML IJ SOLN
4.0000 mg | Freq: Four times a day (QID) | INTRAMUSCULAR | Status: DC | PRN
  Administered 2017-08-28: 4 mg via INTRAVENOUS
  Filled 2017-08-25: qty 2

## 2017-08-25 MED ORDER — ASPIRIN EC 81 MG PO TBEC
81.0000 mg | DELAYED_RELEASE_TABLET | Freq: Every day | ORAL | Status: DC
  Administered 2017-08-27 – 2017-08-29 (×3): 81 mg via ORAL
  Filled 2017-08-25 (×3): qty 1

## 2017-08-25 MED ORDER — IOPAMIDOL (ISOVUE-370) INJECTION 76%
INTRAVENOUS | Status: AC
  Filled 2017-08-25: qty 100

## 2017-08-25 MED ORDER — SODIUM CHLORIDE 0.9 % WEIGHT BASED INFUSION: 3.0000 mL/kg/h | INTRAVENOUS | Status: DC

## 2017-08-25 MED ORDER — NITROGLYCERIN 0.4 MG SL SUBL: 0.4000 mg | SUBLINGUAL_TABLET | SUBLINGUAL | Status: DC | PRN

## 2017-08-25 MED ORDER — HEPARIN BOLUS VIA INFUSION
2000.0000 [IU] | Freq: Once | INTRAVENOUS | Status: AC
  Administered 2017-08-25: 2000 [IU] via INTRAVENOUS
  Filled 2017-08-25: qty 2000

## 2017-08-25 NOTE — ED Notes (Signed)
Date and time results received: 08/25/17 0208 (use smartphrase ".now" to insert current time)  Test: Troponin Critical Value: 1.01  Name of Provider Notified: [facility], [place]  Orders Received? Or Actions Taken?: Orders Received - See Orders for details

## 2017-08-25 NOTE — ED Notes (Signed)
Bed: KZ99 Expected date:  Expected time:  Means of arrival:  Comments: Res a

## 2017-08-25 NOTE — Progress Notes (Signed)
ANTICOAGULATION CONSULT NOTE - Initial Consult  Pharmacy Consult for Heparin Indication: chest pain/ACS  Allergies  Allergen Reactions  . Latex Other (See Comments)    Blisters on her mouth  . Boniva [Ibandronic Acid]     Muscle aches  . Darvocet [Propoxyphene N-Acetaminophen] Nausea And Vomiting  . Erythromycin Nausea And Vomiting  . Morphine And Related Nausea And Vomiting  . Oxycontin [Oxycodone Hcl] Nausea And Vomiting  . Sulfa Antibiotics Nausea And Vomiting  . Tramadol Nausea And Vomiting  . Vicodin [Hydrocodone-Acetaminophen]     dizzy    Patient Measurements: Height: 4\' 11"  (149.9 cm) Weight: 98 lb (44.5 kg) IBW/kg (Calculated) : 43.2 Heparin Dosing Weight:   Vital Signs: Temp: 97.9 F (36.6 C) (05/08 2324) Temp Source: Oral (05/08 2324) BP: 128/58 (05/09 0330) Pulse Rate: 60 (05/09 0330)  Labs: Recent Labs    08/25/17 0002 08/25/17 0127  HGB 11.3*  --   HCT 34.5*  --   PLT 163  --   CREATININE 1.33*  --   TROPONINI  --  1.01*    Estimated Creatinine Clearance: 21.1 mL/min (A) (by C-G formula based on SCr of 1.33 mg/dL (H)).   Medical History: Past Medical History:  Diagnosis Date  . Cancer ([place])    lung ca  . COPD (chronic obstructive pulmonary disease) ([place])   . Hernia, umbilical   . Hip pain, right   . History of radiation therapy 05/02/12-05/04/12,&05/09/12   rul lung 54Gy/43fx  . HLD (hyperlipidemia)   . Lung mass   . Osteoporosis   . PAC (premature atrial contraction)   . RA (rheumatoid arthritis) ([place])   . Shingles     Medications:  Infusions:  . heparin      Assessment: Patient with ACS.  Baseline coags ordered.  No oral anticoagulants noted on med rec.   Goal of Therapy:  Heparin level 0.3-0.7 units/ml Monitor platelets by anticoagulation protocol: Yes   Plan:  Heparin bolus 2000 units iv x1 Heparin drip at 550 units/hr Daily CBC Next heparin level at [address], [place] 08/25/2017,3:53 AM

## 2017-08-25 NOTE — ED Notes (Signed)
Attempted report. RN not available. Will CB

## 2017-08-25 NOTE — ED Notes (Signed)
I gave critical I Stat troponin results to PA [facility]

## 2017-08-25 NOTE — H&P (Addendum)
Cardiology Admission History and Physical:   Patient ID: [name]; MRN: [id number]; DOB: April 08, 1930   Admission date: 08/24/2017  Primary Care Provider: Mayra Neer, MD Primary Cardiologist: No primary care provider on file.  Primary Electrophysiologist:  n/a  Chief Complaint:  NSTEMI  Patient Profile:   [name] is a 84 y.o. female with a history of lung CA in remission, COPD, HLD, RA, osteoporosis.  History of Present Illness:   [name] came to the ER 05/08 for SOB and nausea, troponin elevated and cards asked to admit.   Pt states she was fine yesterday morning.  Last p.m., she took her usual tramadol and went to bed.  After going to bed, she was very suddenly short of breath.  She also had nausea and describes herself as dripping with sweat.    Is she called 911.  She had a small amount of vomiting.  On the way to the hospital, she got some aspirin, and describes 2 pills under her tongue, probably nitroglycerin.  The nitroglycerin helped her shortness of breath and nausea.  She quit sweating.  At no time, did she have any chest pain.  She has never had symptoms like this before.  She has never had a cardiology evaluation.  She is currently asymptomatic.  She has not had radiation therapy for her lung cancer in several years.  She gets PET scans every 6 months.  Her activity is limited by her rheumatoid arthritis.  She uses a walker reliably.  She does not have a history of frequent falls.  She can go shopping and walk short distances without difficulty.  She has not ever had a cardiology evaluation.  She has had some problems with lower extremity edema.  These improved when she started elevating her legs.  There is no history of orthopnea or PND.   Past Medical History:  Diagnosis Date  . Cancer ([place])    lung ca  . COPD (chronic obstructive pulmonary disease) ([place])   . Hernia, umbilical   . Hip pain, right   . History of radiation therapy  05/02/12-05/04/12,&05/09/12   rul lung 54Gy/21fx  . HLD (hyperlipidemia)   . Lung mass   . Osteoporosis   . PAC (premature atrial contraction)   . RA (rheumatoid arthritis) ([place])   . Shingles     Past Surgical History:  Procedure Laterality Date  . right lobectomy       Medications Prior to Admission: Prior to Admission medications   Medication Sig Start Date End Date Taking? Authorizing Provider  furosemide (LASIX) 20 MG tablet Take 20 mg by mouth daily.  03/31/17  Yes [provider]  predniSONE (DELTASONE) 5 MG tablet Take 1 tablet (5 mg total) by mouth daily with breakfast. 07/28/17  Yes Deveshwar, Abel Presto, MD  traMADol (ULTRAM) 50 MG tablet Take 50 mg by mouth every 6 (six) hours as needed for moderate pain.   Yes [provider]  folic acid (FOLVITE) 1 MG tablet Take 1 tablet (1 mg total) by mouth daily. Patient not taking: Reported on 08/25/2017 12/28/12   Dellinger, Bobby Rumpf, PA-C     Allergies:    Allergies  Allergen Reactions  . Latex Other (See Comments)    Blisters on her mouth  . Boniva [Ibandronic Acid]     Muscle aches  . Darvocet [Propoxyphene N-Acetaminophen] Nausea And Vomiting  . Erythromycin Nausea And Vomiting  . Morphine And Related Nausea And Vomiting  . Oxycontin [Oxycodone Hcl] Nausea And Vomiting  .  Sulfa Antibiotics Nausea And Vomiting  . Tramadol Nausea And Vomiting  . Vicodin [Hydrocodone-Acetaminophen]     dizzy    Social History:   Social History   Socioeconomic History  . Marital status: Widowed    Spouse name: Not on file  . Number of children: Not on file  . Years of education: Not on file  . Highest education level: Not on file  Occupational History  . Occupation: retired    Fish farm manager: RETIRED  Social Needs  . Financial resource strain: Not on file  . Food insecurity:    Worry: Not on file    Inability: Not on file  . Transportation needs:    Medical: Not on file    Non-medical: Not on file  Tobacco Use  .  Smoking status: Former Smoker    Types: Cigarettes    Last attempt to quit: 01/11/2010    Years since quitting: 7.6  . Smokeless tobacco: Never Used  Substance and Sexual Activity  . Alcohol use: No  . Drug use: No  . Sexual activity: Not on file  Lifestyle  . Physical activity:    Days per week: Not on file    Minutes per session: Not on file  . Stress: Not on file  Relationships  . Social connections:    Talks on phone: Not on file    Gets together: Not on file    Attends religious service: Not on file    Active member of club or organization: Not on file    Attends meetings of clubs or organizations: Not on file    Relationship status: Not on file  . Intimate partner violence:    Fear of current or ex partner: Not on file    Emotionally abused: Not on file    Physically abused: Not on file    Forced sexual activity: Not on file  Other Topics Concern  . Not on file  Social History Narrative   Lives in ALF at [facility]    Family History:   The patient's family history includes Cancer in her daughter and mother.   Family Status: The patient indicated that her mother is deceased. She indicated that her father is deceased. She indicated that only one of her two sisters is alive. She indicated that her brother is deceased. She indicated that her daughter is alive.   ROS:  Please see the history of present illness.  All other ROS reviewed and negative.     Physical Exam/Data:   Vitals:   08/25/17 1130 08/25/17 1159 08/25/17 1200 08/25/17 1230  BP: (!) 141/51 (!) 137/58 (!) 137/58 135/61  Pulse: (!) 58  64   Resp: 18 19 17 17   Temp:      TempSrc:      SpO2: 99% 97% 97% 97%  Weight:      Height:       No intake or output data in the 24 hours ending 08/25/17 1249 Filed Weights   08/24/17 2335  Weight: 98 lb (44.5 kg)   Body mass index is 19.79 kg/m.  General:  Well nourished, frail, elderly female, in no acute distress HEENT: normal Lymph: no adenopathy Neck:   JVD not elevated Endocrine:  No thryomegaly Vascular: No carotid bruits; 4/4 extremity pulses 2+ bilaterally without bruits  Cardiac:  normal S1, S2; RRR; 2/6 murmur, no rub or gallop  Lungs: Scattered coarse rhonchi bilaterally, no wheezing, few rales  Abd: soft, nontender, no hepatomegaly  Ext: Minimal pedal  edema Musculoskeletal:  No deformities, BUE and BLE strength weak but equal Skin: warm and dry  Neuro:  CNs 2-12 intact, no focal abnormalities noted Psych:  Normal affect   EKG:  The ECG that was done 05/08 was personally reviewed and demonstrates SR, HR 71, LAFB, LVH  Relevant CV Studies: None previously  Laboratory Data:  Chemistry Recent Labs  Lab 08/25/17 0002  NA 136  K 4.9  CL 100*  CO2 26  GLUCOSE 105*  BUN 30*  CREATININE 1.33*  CALCIUM 8.7*  GFRNONAA 35*  GFRAA 41*  ANIONGAP 10     Hematology Recent Labs  Lab 08/25/17 0002  WBC 7.2  RBC 3.48*  HGB 11.3*  HCT 34.5*  MCV 99.1  MCH 32.5  MCHC 32.8  RDW 14.1  PLT 163   Cardiac Enzymes Recent Labs  Lab 08/25/17 0127  TROPONINI 1.01*    Recent Labs  Lab 08/25/17 0011  TROPIPOC 0.33*    BNP Recent Labs  Lab 08/24/17 2347  BNP 211.0*     Radiology/Studies:  Dg Chest 2 View  Result Date: 08/25/2017 CLINICAL DATA:  84 year old female with shortness of breath. History of right upper lobe carcinoma status post radiation treatment. EXAM: CHEST - 2 VIEW COMPARISON:  Chest CT dated 07/12/2017. FINDINGS: Focal opacity in the right upper lobe similar to prior CT consistent with radiation treatment. No new consolidative changes. There is no pleural effusion or pneumothorax. The cardiac silhouette is within normal limits. There is atherosclerotic calcification of the aorta. Osteopenia with degenerative changes and scoliosis of the spine. No acute osseous pathology. IMPRESSION: 1. No acute cardiopulmonary process. 2. Post treatment changes of the right upper lobe. Electronically Signed   By: Anner Crete M.D.   On: 08/25/2017 01:05   Ct Angio Chest Pe W And/or Wo Contrast  Result Date: 08/25/2017 CLINICAL DATA:  Dyspnea since 20/2 100 hours. Left upper lobe lung cancer with radiation and lobectomy. COPD. EXAM: CT ANGIOGRAPHY CHEST WITH CONTRAST TECHNIQUE: Multidetector CT imaging of the chest was performed using the standard protocol during bolus administration of intravenous contrast. Multiplanar CT image reconstructions and MIPs were obtained to evaluate the vascular anatomy. CONTRAST:  60mL ISOVUE-370 IOPAMIDOL (ISOVUE-370) INJECTION 76% COMPARISON:  07/12/2017 and 10/24/2012 chest CT FINDINGS: Cardiovascular: Aortic and coronary arteriosclerosis. Borderline cardiomegaly without pericardial effusion. Ectatic thoracic aorta without aneurysm. Satisfactory opacification of the pulmonary arteries without pulmonary embolus. Mediastinum/Nodes: No mediastinal or hilar lymphadenopathy. Patent trachea and mainstem bronchi. Esophagus is unremarkable. Lungs/Pleura: Volume loss with new confluent area of atelectasis and bronchiectasis in the right upper lobe. Stable bibasilar scarring some which appears calcified in the left lower lobe. No pneumothorax. Stable nodularity in the medial right upper lobe adjacent to the major fissure measuring 7.2 x 5.9 mm, unchanged in appearance. Paraseptal adjacent emphysema is seen. Redemonstration of ovoid masslike opacity in the left lower lobe unchanged in appearance measuring 11.9 x 7.3 cm on series 6/60. No effusion or pneumothorax. Upper Abdomen: Splenic granulomas. No space-occupying mass of the liver nor included adrenal glands. Musculoskeletal: No aggressive nor suspicious osseous lesions. Review of the MIP images confirms the above findings. IMPRESSION: 1. Cardiomegaly with coronary arteriosclerosis. Ectatic aorta without aneurysm. No acute pulmonary embolus. 2. New confluent area of atelectasis with bronchiectasis in the right upper lobe. 3. Stable bibasilar scarring  with ovoid pulmonary nodular opacity measuring 11.9 x 7.3 mm in the left lower lobe consistent with a benign finding given stability dating back to 2014. Aortic Atherosclerosis (ICD10-I70.0).  Electronically Signed   By: Ashley Royalty M.D.   On: 08/25/2017 03:02    Assessment and Plan:   Active Problems: 1.  NSTEMI (non-ST elevated myocardial infarction) ([place]) - tx [facility], admit, continue to cycle ez - continue ASA - ck echo - no cath possible today, can put on board for tomorrow. - HR 50s at baseline so no BB - add high-dose statin, ck lipids, liver  2. Depression - per daughter, pt has been crying all the time, was previously on Zoloft - MD advise if we should start rx or let her f/u with PCP, per dtr, pt has appt this month.  3. RA - continue home rx   Severity of Illness: The appropriate patient status for this patient is INPATIENT. Inpatient status is judged to be reasonable and necessary in order to provide the required intensity of service to ensure the patient's safety. The patient's presenting symptoms, physical exam findings, and initial radiographic and laboratory data in the context of their chronic comorbidities is felt to place them at high risk for further clinical deterioration. Furthermore, it is not anticipated that the patient will be medically stable for discharge from the hospital within 2 midnights of admission. The following factors support the patient status of inpatient.   " The patient's presenting symptoms include N&V. " The worrisome physical exam findings include elevated troponin. " The initial radiographic and laboratory data are worrisome because pt ruled in for NSTEMI. " The chronic co-morbidities include hyperlipidemia, age.   * I certify that at the point of admission it is my clinical judgment that the patient will require inpatient hospital care spanning beyond 2 midnights from the point of admission due to high intensity of service, high risk for  further deterioration and high frequency of surveillance required.*    For questions or updates, please contact [place] Please consult [email or web address] for contact info under Cardiology/STEMI.    Signed, Rosaria Ferries, PA-C  08/25/2017 12:49 PM   I have seen and examined the patient along with Rosaria Ferries, PA-C .  I have reviewed the chart, notes and new data.  I agree with PA's note.  Key new complaints: Presenting syndrome highly compatible with an acute myocardial infarction, thankfully with only limited myocardial injury and now asymptomatic after treatment with aspirin, heparin, nitrates.  No recurrent angina or dyspnea at rest.  Past medical history includes lung cancer and radiation therapy, as well as disabling rheumatoid arthritis. Key examination changes: No overt findings of hypervolemia/heart failure, no meaningful arrhythmia, prominent joint deformities consistent with rheumatoid arthritis especially obvious in the bilateral metacarpophalangeal and proximal interphalangeal joints.  Does not have pulmonary rales, but has loud noisy left pleural rubs, possibly sequelae of previous chest radiation therapy.  Rare rhonchi, no wheezing. Key new findings / data: ECG shows very subtle nonspecific ST segment elevation in lead V1-V2 and T wave inversion in 1, aVL suggesting possible LAD artery disease (or diagonal or ramus intermedius) and peak troponin is only 1.1 so far.  Creatinine is 1.33, but in view of her age and very lean body habitus this actually is consistent with significant chronic kidney disease with a GFR of no more than 35.  PLAN: Recommend cardiac catheterization, unfortunately will not be able to perform today. Continue heparin, aspirin.  Can try a very low dose of beta-blocker, carefully monitoring for worsening COPD or diastolic hypotension.  Statin.  Echocardiogram. Diagnostic cardiac catheterization and possible angioplasty/stent discussed in detail with the patient  and her  daughter.This procedure has been fully reviewed with the patient and written informed consent has been obtained.  Will likely be performed tomorrow morning.   Sanda Klein, MD, [place] [phone] 08/25/2017, 2:29 PM

## 2017-08-25 NOTE — ED Notes (Signed)
Attempted report. RN note available. Will CB

## 2017-08-25 NOTE — Progress Notes (Signed)
ANTICOAGULATION CONSULT NOTE - Initial Consult  Pharmacy Consult for Heparin Indication: chest pain/ACS  Allergies  Allergen Reactions  . Latex Other (See Comments)    Blisters on her mouth  . Boniva [Ibandronic Acid]     Muscle aches  . Darvocet [Propoxyphene N-Acetaminophen] Nausea And Vomiting  . Erythromycin Nausea And Vomiting  . Morphine And Related Nausea And Vomiting  . Oxycontin [Oxycodone Hcl] Nausea And Vomiting  . Sulfa Antibiotics Nausea And Vomiting  . Tramadol Nausea And Vomiting  . Vicodin [Hydrocodone-Acetaminophen]     dizzy    Patient Measurements: Height: 4\' 11"  (149.9 cm) Weight: 98 lb (44.5 kg) IBW/kg (Calculated) : 43.2 Heparin Dosing Weight:   Vital Signs: BP: 121/61 (05/09 1330) Pulse Rate: 69 (05/09 1330)  Labs: Recent Labs    08/25/17 0002 08/25/17 0127 08/25/17 0343 08/25/17 1306  HGB 11.3*  --   --   --   HCT 34.5*  --   --   --   PLT 163  --   --   --   APTT  --   --  25  --   LABPROT  --   --  14.3  --   INR  --   --  1.12  --   HEPARINUNFRC  --   --   --  0.43  CREATININE 1.33*  --   --   --   TROPONINI  --  1.01*  --   --     Estimated Creatinine Clearance: 21.1 mL/min (A) (by C-G formula based on SCr of 1.33 mg/dL (H)).   Medications:  Infusions:  . heparin 550 Units/hr (08/25/17 0352)    Assessment: 56 yoF with PMH Lung CA in remission, COPD, HLD, RA, osteoporosis, admitted by Cardiology on 5/8 with NSTEMI   Baseline INR, aPTT: wnl  Prior anticoagulation: none  Significant events:  Today, 08/25/2017:  CBC: Hgb slightly low, Plt wnl  Serial troponins elevated  Most recent heparin level therapeutic on 550 units/hr  No bleeding or infusion issues per nursing  CrCl: 21 ml/min  Goal of Therapy: Heparin level 0.3-0.7 units/ml Monitor platelets by anticoagulation protocol: Yes  Plan:  Continue heparin IV infusion at 550 units/hr  Recheck confirmatory heparin level along with last troponin at 0100 tomorrow  (~12 hr level)  Daily CBC, daily heparin level once stable  Monitor for signs of bleeding or thrombosis   Reuel Boom, PharmD, BCPS [phone] 08/25/2017, 2:08 PM

## 2017-08-26 ENCOUNTER — Encounter (HOSPITAL_COMMUNITY): Payer: Self-pay | Admitting: Cardiology

## 2017-08-26 ENCOUNTER — Encounter (HOSPITAL_COMMUNITY)
Admission: EM | Disposition: A | Discharge status: Skilled Nursing Facility | Payer: Self-pay | Source: Home / Self Care | Attending: Cardiovascular Disease

## 2017-08-26 ENCOUNTER — Inpatient Hospital Stay (HOSPITAL_COMMUNITY): Payer: Medicare Other

## 2017-08-26 ENCOUNTER — Other Ambulatory Visit (HOSPITAL_COMMUNITY): Payer: Medicare Other

## 2017-08-26 DIAGNOSIS — R079 Chest pain, unspecified: Secondary | ICD-10-CM

## 2017-08-26 DIAGNOSIS — F329 Major depressive disorder, single episode, unspecified: Secondary | ICD-10-CM

## 2017-08-26 HISTORY — PX: LEFT HEART CATH AND CORONARY ANGIOGRAPHY: CATH118249

## 2017-08-26 LAB — COMPREHENSIVE METABOLIC PANEL
ALT: 11 U/L — ABNORMAL LOW (ref 14–54)
AST: 20 U/L (ref 15–41)
Albumin: 2.8 g/dL — ABNORMAL LOW (ref 3.5–5.0)
Alkaline Phosphatase: 39 U/L (ref 38–126)
Anion gap: 8 (ref 5–15)
BUN: 18 mg/dL (ref 6–20)
CO2: 30 mmol/L (ref 22–32)
Calcium: 8.2 mg/dL — ABNORMAL LOW (ref 8.9–10.3)
Chloride: 99 mmol/L — ABNORMAL LOW (ref 101–111)
Creatinine, Ser: 1.27 mg/dL — ABNORMAL HIGH (ref 0.44–1.00)
GFR calc Af Amer: 43 mL/min — ABNORMAL LOW (ref >60)
GFR calc non Af Amer: 37 mL/min — ABNORMAL LOW (ref >60)
Glucose, Bld: 90 mg/dL (ref 65–99)
Potassium: 3.7 mmol/L (ref 3.5–5.1)
Sodium: 137 mmol/L (ref 135–145)
Total Bilirubin: 0.9 mg/dL (ref 0.3–1.2)
Total Protein: 5.3 g/dL — ABNORMAL LOW (ref 6.5–8.1)

## 2017-08-26 LAB — LIPID PANEL
Cholesterol: 168 mg/dL (ref 0–200)
HDL: 68 mg/dL (ref >40)
LDL Cholesterol: 86 mg/dL (ref 0–99)
Total CHOL/HDL Ratio: 2.5 RATIO
Triglycerides: 68 mg/dL (ref <150)
VLDL: 14 mg/dL (ref 0–40)

## 2017-08-26 LAB — ECHOCARDIOGRAM COMPLETE
Height: 59 in
Weight: 1499.13 oz

## 2017-08-26 LAB — CBC
HCT: 35.7 % — ABNORMAL LOW (ref 36.0–46.0)
Hemoglobin: 11.4 g/dL — ABNORMAL LOW (ref 12.0–15.0)
MCH: 31.9 pg (ref 26.0–34.0)
MCHC: 31.9 g/dL (ref 30.0–36.0)
MCV: 100 fL (ref 78.0–100.0)
Platelets: 214 10*3/uL (ref 150–400)
RBC: 3.57 MIL/uL — ABNORMAL LOW (ref 3.87–5.11)
RDW: 14.2 % (ref 11.5–15.5)
WBC: 8.9 10*3/uL (ref 4.0–10.5)

## 2017-08-26 LAB — HEPARIN LEVEL (UNFRACTIONATED)
Heparin Unfractionated: 0.28 IU/mL — ABNORMAL LOW (ref 0.30–0.70)
Heparin Unfractionated: 0.49 IU/mL (ref 0.30–0.70)

## 2017-08-26 SURGERY — LEFT HEART CATH AND CORONARY ANGIOGRAPHY
Anesthesia: LOCAL

## 2017-08-26 MED ORDER — HEPARIN (PORCINE) IN NACL 1000-0.9 UT/500ML-% IV SOLN
INTRAVENOUS | Status: AC
  Filled 2017-08-26: qty 1000

## 2017-08-26 MED ORDER — VERAPAMIL HCL 2.5 MG/ML IV SOLN
INTRAVENOUS | Status: AC
  Filled 2017-08-26: qty 2

## 2017-08-26 MED ORDER — IOPAMIDOL (ISOVUE-370) INJECTION 76%
INTRAVENOUS | Status: AC
  Filled 2017-08-26: qty 100

## 2017-08-26 MED ORDER — MIDAZOLAM HCL 2 MG/2ML IJ SOLN
INTRAMUSCULAR | Status: AC
  Filled 2017-08-26: qty 2

## 2017-08-26 MED ORDER — HEPARIN SODIUM (PORCINE) 1000 UNIT/ML IJ SOLN
INTRAMUSCULAR | Status: AC
  Filled 2017-08-26: qty 1

## 2017-08-26 MED ORDER — MIDAZOLAM HCL 2 MG/2ML IJ SOLN
INTRAMUSCULAR | Status: DC | PRN
  Administered 2017-08-26: 1 mg via INTRAVENOUS

## 2017-08-26 MED ORDER — LIDOCAINE HCL (PF) 1 % IJ SOLN
INTRAMUSCULAR | Status: AC
  Filled 2017-08-26: qty 30

## 2017-08-26 MED ORDER — HEPARIN SODIUM (PORCINE) 1000 UNIT/ML IJ SOLN
INTRAMUSCULAR | Status: DC | PRN
  Administered 2017-08-26: 2200 [IU] via INTRAVENOUS

## 2017-08-26 MED ORDER — SODIUM CHLORIDE 0.9 % WEIGHT BASED INFUSION
1.0000 mL/kg/h | INTRAVENOUS | Status: DC
  Administered 2017-08-26: 1 mL/kg/h via INTRAVENOUS

## 2017-08-26 MED ORDER — IOPAMIDOL (ISOVUE-370) INJECTION 76%
INTRAVENOUS | Status: DC | PRN
  Administered 2017-08-26: 60 mL

## 2017-08-26 MED ORDER — HEPARIN SODIUM (PORCINE) 5000 UNIT/ML IJ SOLN
5000.0000 [IU] | Freq: Three times a day (TID) | INTRAMUSCULAR | Status: DC
  Administered 2017-08-26 – 2017-08-29 (×8): 5000 [IU] via SUBCUTANEOUS
  Filled 2017-08-26 (×8): qty 1

## 2017-08-26 MED ORDER — LIDOCAINE HCL (PF) 1 % IJ SOLN
INTRAMUSCULAR | Status: DC | PRN
  Administered 2017-08-26: 2 mL

## 2017-08-26 MED ORDER — HEPARIN (PORCINE) IN NACL 2-0.9 UNITS/ML
INTRAMUSCULAR | Status: AC | PRN
  Administered 2017-08-26 (×2): 500 mL

## 2017-08-26 MED ORDER — SODIUM CHLORIDE 0.9 % WEIGHT BASED INFUSION: 1.0000 mL/kg/h | INTRAVENOUS | Status: AC

## 2017-08-26 MED ORDER — LOPERAMIDE HCL 2 MG PO CAPS
2.0000 mg | ORAL_CAPSULE | ORAL | Status: DC | PRN
  Administered 2017-08-26: 2 mg via ORAL
  Filled 2017-08-26: qty 1

## 2017-08-26 MED ORDER — SODIUM CHLORIDE 0.9 % WEIGHT BASED INFUSION
3.0000 mL/kg/h | INTRAVENOUS | Status: DC
  Administered 2017-08-26: 3 mL/kg/h via INTRAVENOUS

## 2017-08-26 MED ORDER — SERTRALINE HCL 50 MG PO TABS
25.0000 mg | ORAL_TABLET | Freq: Every day | ORAL | Status: DC
  Administered 2017-08-26 – 2017-08-29 (×4): 25 mg via ORAL
  Filled 2017-08-26 (×4): qty 1

## 2017-08-26 MED ORDER — VERAPAMIL HCL 2.5 MG/ML IV SOLN
INTRAVENOUS | Status: DC | PRN
  Administered 2017-08-26: 10 mL via INTRA_ARTERIAL

## 2017-08-26 MED ORDER — SODIUM CHLORIDE 0.9 % IV SOLN: 250.0000 mL | INTRAVENOUS | Status: DC | PRN

## 2017-08-26 MED ORDER — SODIUM CHLORIDE 0.9% FLUSH
3.0000 mL | Freq: Two times a day (BID) | INTRAVENOUS | Status: DC
  Administered 2017-08-27 – 2017-08-29 (×5): 3 mL via INTRAVENOUS

## 2017-08-26 MED ORDER — SODIUM CHLORIDE 0.9% FLUSH: 3.0000 mL | INTRAVENOUS | Status: DC | PRN

## 2017-08-26 SURGICAL SUPPLY — 16 items
CATH INFINITI 5 FR JL3.5 (CATHETERS) ×1 IMPLANT
CATH INFINITI 5FR ANG PIGTAIL (CATHETERS) ×1 IMPLANT
CATH INFINITI JR4 5F (CATHETERS) ×1 IMPLANT
DEVICE RAD COMP TR BAND LRG (VASCULAR PRODUCTS) ×1 IMPLANT
DEVICE RAD TR BAND REGULAR (VASCULAR PRODUCTS) ×1 IMPLANT
GUIDEWIRE INQWIRE 1.5J.035X260 (WIRE) IMPLANT
INQWIRE 1.5J .035X260CM (WIRE) ×2
KIT HEART LEFT (KITS) ×2 IMPLANT
NDL PERC 21GX4CM (NEEDLE) IMPLANT
NEEDLE PERC 21GX4CM (NEEDLE) ×2 IMPLANT
PACK CARDIAC CATHETERIZATION (CUSTOM PROCEDURE TRAY) ×2 IMPLANT
SHEATH RAIN RADIAL 21G 6FR (SHEATH) ×1 IMPLANT
SYR MEDRAD MARK V 150ML (SYRINGE) ×2 IMPLANT
TRANSDUCER W/STOPCOCK (MISCELLANEOUS) ×2 IMPLANT
TUBING CIL FLEX 10 FLL-RA (TUBING) ×2 IMPLANT
WIRE HI TORQ VERSACORE-J 145CM (WIRE) ×1 IMPLANT

## 2017-08-26 NOTE — H&P (View-Only) (Signed)
Progress Note  Patient Name: [name] Date of Encounter: 08/26/2017  Primary Cardiologist: No primary care provider on file.   Subjective   No further angina since admission.  Denies dyspnea and is able to lie fully supine in bed. Still has some L ankle edema. She seems very tearful and makes repeated statements that suggest depression.  Her daughter reports that she used to take sertraline, but decided to take herself off that medication several months ago. Troponin has peaked at 1.1, but ECG shows marked changes subtle ST segment elevation and prominent T wave inversion across the anterior and inferior leads with prolongation of the QTC 490 ms.  Inpatient Medications    Scheduled Meds: . aspirin EC  81 mg Oral Daily  . atorvastatin  80 mg Oral q1800  . furosemide  20 mg Oral Daily  . sodium chloride flush  3 mL Intravenous Q12H  . sodium chloride flush  3 mL Intravenous Q12H   Continuous Infusions: . sodium chloride    . sodium chloride    . sodium chloride 1 mL/kg/hr (08/26/17 0657)  . heparin 650 Units/hr (08/26/17 0416)   PRN Meds: sodium chloride, sodium chloride, acetaminophen, ALPRAZolam, nitroGLYCERIN, ondansetron (ZOFRAN) IV, sodium chloride flush, sodium chloride flush, traMADol, zolpidem   Vital Signs    Vitals:   08/25/17 2331 08/26/17 0420 08/26/17 0423 08/26/17 0804  BP: (!) 143/53 (!) 132/44  (!) 137/56  Pulse:    (!) 59  Resp: 19 (!) 23  20  Temp: 98.7 F (37.1 C) 98.1 F (36.7 C)  (!) 97.5 F (36.4 C)  TempSrc: Oral Oral  Oral  SpO2: 99% 99%  99%  Weight:   93 lb 11.1 oz (42.5 kg)   Height:   4\' 11"  (1.499 m)     Intake/Output Summary (Last 24 hours) at 08/26/2017 0923 Last data filed at 08/26/2017 0530 Gross per 24 hour  Intake 260.73 ml  Output 650 ml  Net -389.27 ml   Filed Weights   08/24/17 2335 08/26/17 0423  Weight: 98 lb (44.5 kg) 93 lb 11.1 oz (42.5 kg)    Telemetry    NSR, mild sinus bradycardia - Personally  Reviewed  ECG    Normal sinus rhythm, posterior fascicular block, nonspecific ST segment elevation across the anterior and inferior leads, moderately prolonged QTC 492 ms - Personally Reviewed  Physical Exam  Appears very old and frail, GEN: No acute distress.   Neck: No JVD Cardiac: RRR, no murmurs, rubs, or gallops.  Respiratory: Clear to auscultation bilaterally. GI: Soft, nontender, non-distended  MS:  1-2+ left ankle edema, soft; ulnar deviation and finger deformities consistent with arthritis Neuro:  Nonfocal  Psych: Depressed mood  Labs    Chemistry Recent Labs  Lab 08/25/17 0002 08/26/17 0038  NA 136 137  K 4.9 3.7  CL 100* 99*  CO2 26 30  GLUCOSE 105* 90  BUN 30* 18  CREATININE 1.33* 1.27*  CALCIUM 8.7* 8.2*  PROT  --  5.3*  ALBUMIN  --  2.8*  AST  --  20  ALT  --  11*  ALKPHOS  --  39  BILITOT  --  0.9  GFRNONAA 35* 37*  GFRAA 41* 43*  ANIONGAP 10 8     Hematology Recent Labs  Lab 08/25/17 0002 08/26/17 0038  WBC 7.2 8.9  RBC 3.48* 3.57*  HGB 11.3* 11.4*  HCT 34.5* 35.7*  MCV 99.1 100.0  MCH 32.5 31.9  MCHC 32.8 31.9  RDW  14.1 14.2  PLT 163 214    Cardiac Enzymes Recent Labs  Lab 08/25/17 0127 08/25/17 1306  TROPONINI 1.01* 1.16*    Recent Labs  Lab 08/25/17 0011  TROPIPOC 0.33*     BNP Recent Labs  Lab 08/24/17 2347  BNP 211.0*     DDimer No results for input(s): DDIMER in the last 168 hours.   Radiology    Dg Chest 2 View  Result Date: 08/25/2017 CLINICAL DATA:  84 year old female with shortness of breath. History of right upper lobe carcinoma status post radiation treatment. EXAM: CHEST - 2 VIEW COMPARISON:  Chest CT dated 07/12/2017. FINDINGS: Focal opacity in the right upper lobe similar to prior CT consistent with radiation treatment. No new consolidative changes. There is no pleural effusion or pneumothorax. The cardiac silhouette is within normal limits. There is atherosclerotic calcification of the aorta.  Osteopenia with degenerative changes and scoliosis of the spine. No acute osseous pathology. IMPRESSION: 1. No acute cardiopulmonary process. 2. Post treatment changes of the right upper lobe. Electronically Signed   By: Anner Crete M.D.   On: 08/25/2017 01:05   Ct Angio Chest Pe W And/or Wo Contrast  Result Date: 08/25/2017 CLINICAL DATA:  Dyspnea since 20/2 100 hours. Left upper lobe lung cancer with radiation and lobectomy. COPD. EXAM: CT ANGIOGRAPHY CHEST WITH CONTRAST TECHNIQUE: Multidetector CT imaging of the chest was performed using the standard protocol during bolus administration of intravenous contrast. Multiplanar CT image reconstructions and MIPs were obtained to evaluate the vascular anatomy. CONTRAST:  77mL ISOVUE-370 IOPAMIDOL (ISOVUE-370) INJECTION 76% COMPARISON:  07/12/2017 and 10/24/2012 chest CT FINDINGS: Cardiovascular: Aortic and coronary arteriosclerosis. Borderline cardiomegaly without pericardial effusion. Ectatic thoracic aorta without aneurysm. Satisfactory opacification of the pulmonary arteries without pulmonary embolus. Mediastinum/Nodes: No mediastinal or hilar lymphadenopathy. Patent trachea and mainstem bronchi. Esophagus is unremarkable. Lungs/Pleura: Volume loss with new confluent area of atelectasis and bronchiectasis in the right upper lobe. Stable bibasilar scarring some which appears calcified in the left lower lobe. No pneumothorax. Stable nodularity in the medial right upper lobe adjacent to the major fissure measuring 7.2 x 5.9 mm, unchanged in appearance. Paraseptal adjacent emphysema is seen. Redemonstration of ovoid masslike opacity in the left lower lobe unchanged in appearance measuring 11.9 x 7.3 cm on series 6/60. No effusion or pneumothorax. Upper Abdomen: Splenic granulomas. No space-occupying mass of the liver nor included adrenal glands. Musculoskeletal: No aggressive nor suspicious osseous lesions. Review of the MIP images confirms the above findings.  IMPRESSION: 1. Cardiomegaly with coronary arteriosclerosis. Ectatic aorta without aneurysm. No acute pulmonary embolus. 2. New confluent area of atelectasis with bronchiectasis in the right upper lobe. 3. Stable bibasilar scarring with ovoid pulmonary nodular opacity measuring 11.9 x 7.3 mm in the left lower lobe consistent with a benign finding given stability dating back to 2014. Aortic Atherosclerosis (ICD10-I70.0). Electronically Signed   By: Ashley Royalty M.D.   On: 08/25/2017 03:02    Cardiac Studies   n/a  Patient Profile     84 y.o. cachectic female with long-standing rheumatoid arthritis, lung cancer in remission after XRT, COPD, presenting with non-STEMI, now with worsening ECG changes.  Atherosclerotic changes in the thoracic aorta and coronaries by CT as well as femoral arteries on previous US.  Assessment & Plan    1.  NSTEMI: Currently asymptomatic at rest.  ECG changes are out of proportion with the brevity of chest discomfort and the minor increase in cardiac enzymes.  There is a possibility that she  has a much larger area of myocardium in jeopardy, for example proximal LAD disease. Takotsubo sd is a less likely explanation.  Plan aspirin, statins, intravenous heparin, coronary angiography and revascularization as indicated today.  No beta-blockers due to bradycardia. 2.  Depression: Fairly obvious diagnosis in my opinion.  Will restart very low-dose of sertraline.  Needs to follow-up with primary provider after DC. 3.  Aortic atherosclerosis without aneurysm.  4.  RA: Disabling, probably explains her depression to some degree. Dr. Estanislado Pandy.  Note long-standing treatment with prednisone.  Need to be aware of possible iatrogenic adrenal suppression and need for stress dose corticosteroids if she becomes more acutely ill. 5.  COPD and Rt Lung CA in remission after XRT 2017 6. Ankle edema: Chronic problem, etiology uncertain, seems to be receding with diuretics.   For questions or  updates, please contact [place] Please consult [email or web address] for contact info under Cardiology/STEMI.      Signed, Sanda Klein, MD  08/26/2017, 9:23 AM

## 2017-08-26 NOTE — Progress Notes (Signed)
Floor called because the pt had a hematoma and it was getting larger at the TR band site. This RN came up and assessed the site. With assistance from floor staff, repositioned the TR band, held pressure on the hematoma distal to the band and was able to express some of the blood. Pt did not tolerate very well as she stated it was very painful. Floor nurses at the bedside and reassuring the patient. Coban applied distally to the TR band as well. Staff mentioned the hematoma had gone down in size. Encouraged pt to keep arm on pillow and not to move and if she noticed any more swelling or bleeding to notify her nurse immediately. Primary RN made aware of coban application. TR band left on with 17 cc's of air in band.

## 2017-08-26 NOTE — Interval H&P Note (Signed)
History and Physical Interval Note:  08/26/2017 9:52 AM  [name]  has presented today for surgery, with the diagnosis of nstemi  The various methods of treatment have been discussed with the patient and family. After consideration of risks, benefits and other options for treatment, the patient has consented to  Procedure(s): LEFT HEART CATH AND CORONARY ANGIOGRAPHY (N/A) as a surgical intervention .  The patient's history has been reviewed, patient examined, no change in status, stable for surgery.  I have reviewed the patient's chart and labs.  Questions were answered to the patient's satisfaction.    Cath Lab Visit (complete for each Cath Lab visit)  Clinical Evaluation Leading to the Procedure:   ACS: Yes.    Non-ACS:    Anginal Classification: CCS IV  Anti-ischemic medical therapy: No Therapy  Non-Invasive Test Results: No non-invasive testing performed  Prior CABG: No previous CABG       Collier Salina [facility] 08/26/2017 9:52 AM

## 2017-08-26 NOTE — Progress Notes (Signed)
[place] for Heparin Indication: chest pain/ACS  Allergies  Allergen Reactions  . Latex Other (See Comments)    Blisters on her mouth  . Boniva [Ibandronic Acid]     Muscle aches  . Darvocet [Propoxyphene N-Acetaminophen] Nausea And Vomiting  . Erythromycin Nausea And Vomiting  . Morphine And Related Nausea And Vomiting  . Oxycontin [Oxycodone Hcl] Nausea And Vomiting  . Sulfa Antibiotics Nausea And Vomiting  . Tramadol Nausea And Vomiting  . Vicodin [Hydrocodone-Acetaminophen]     dizzy   Patient Measurements: Height: 4\' 11"  (149.9 cm) Weight: 98 lb (44.5 kg) IBW/kg (Calculated) : 43.2  Vital Signs: Temp: 98.7 F (37.1 C) (05/09 2331) Temp Source: Oral (05/09 2331) BP: 143/53 (05/09 2331) Pulse Rate: 70 (05/09 1915)  Labs: Recent Labs    08/25/17 0002 08/25/17 0127 08/25/17 0343 08/25/17 1306 08/26/17 0038  HGB 11.3*  --   --   --  11.4*  HCT 34.5*  --   --   --  35.7*  PLT 163  --   --   --  214  APTT  --   --  25  --   --   LABPROT  --   --  14.3  --   --   INR  --   --  1.12  --   --   HEPARINUNFRC  --   --   --  0.43 0.28*  CREATININE 1.33*  --   --   --  1.27*  TROPONINI  --  1.01*  --  1.16*  --     Estimated Creatinine Clearance: 22.1 mL/min (A) (by C-G formula based on SCr of 1.27 mg/dL (H)).   Medications:  Infusions:  . sodium chloride    . sodium chloride    . sodium chloride     Followed by  . sodium chloride    . heparin 550 Units/hr (08/25/17 0352)    Assessment: 49 yoF with PMH Lung CA in remission, COPD, HLD, RA, osteoporosis, admitted by Cardiology on 5/8 with NSTEMI   Baseline INR, aPTT: wnl  Prior anticoagulation: none  Significant events:  Today, 08/26/2017:  Heparin level low this AM  Hgb stable  Likely cath later today  Goal of Therapy: Heparin level 0.3-0.7 units/ml Monitor platelets by anticoagulation protocol: Yes  Plan:  Inc heparin to 650 units/hr  1000  HL  Possible cath today  Narda Bonds, PharmD, BCPS Clinical Pharmacist Phone: [phone]

## 2017-08-26 NOTE — Progress Notes (Signed)
Absence of meaningful coronary stenoses on ECG and absence of regional wall motion abnormalities suggests this was not a NSTEMI. There are nevertheless marked ECG changes that could be compatible with Takotsubo syndrome. Will wait for further evaluation of wall motion by echo. Sanda Klein, MD, [facility] [phone] office [phone] pager

## 2017-08-26 NOTE — Progress Notes (Addendum)
Progress Note  Patient Name: [name] Date of Encounter: 08/26/2017  Primary Cardiologist: No primary care provider on file.   Subjective   No further angina since admission.  Denies dyspnea and is able to lie fully supine in bed. Still has some L ankle edema. She seems very tearful and makes repeated statements that suggest depression.  Her daughter reports that she used to take sertraline, but decided to take herself off that medication several months ago. Troponin has peaked at 1.1, but ECG shows marked changes subtle ST segment elevation and prominent T wave inversion across the anterior and inferior leads with prolongation of the QTC 490 ms.  Inpatient Medications    Scheduled Meds: . aspirin EC  81 mg Oral Daily  . atorvastatin  80 mg Oral q1800  . furosemide  20 mg Oral Daily  . sodium chloride flush  3 mL Intravenous Q12H  . sodium chloride flush  3 mL Intravenous Q12H   Continuous Infusions: . sodium chloride    . sodium chloride    . sodium chloride 1 mL/kg/hr (08/26/17 0657)  . heparin 650 Units/hr (08/26/17 0416)   PRN Meds: sodium chloride, sodium chloride, acetaminophen, ALPRAZolam, nitroGLYCERIN, ondansetron (ZOFRAN) IV, sodium chloride flush, sodium chloride flush, traMADol, zolpidem   Vital Signs    Vitals:   08/25/17 2331 08/26/17 0420 08/26/17 0423 08/26/17 0804  BP: (!) 143/53 (!) 132/44  (!) 137/56  Pulse:    (!) 59  Resp: 19 (!) 23  20  Temp: 98.7 F (37.1 C) 98.1 F (36.7 C)  (!) 97.5 F (36.4 C)  TempSrc: Oral Oral  Oral  SpO2: 99% 99%  99%  Weight:   93 lb 11.1 oz (42.5 kg)   Height:   4\' 11"  (1.499 m)     Intake/Output Summary (Last 24 hours) at 08/26/2017 0923 Last data filed at 08/26/2017 0530 Gross per 24 hour  Intake 260.73 ml  Output 650 ml  Net -389.27 ml   Filed Weights   08/24/17 2335 08/26/17 0423  Weight: 98 lb (44.5 kg) 93 lb 11.1 oz (42.5 kg)    Telemetry    NSR, mild sinus bradycardia - Personally  Reviewed  ECG    Normal sinus rhythm, posterior fascicular block, nonspecific ST segment elevation across the anterior and inferior leads, moderately prolonged QTC 492 ms - Personally Reviewed  Physical Exam  Appears very old and frail, GEN: No acute distress.   Neck: No JVD Cardiac: RRR, no murmurs, rubs, or gallops.  Respiratory: Clear to auscultation bilaterally. GI: Soft, nontender, non-distended  MS:  1-2+ left ankle edema, soft; ulnar deviation and finger deformities consistent with arthritis Neuro:  Nonfocal  Psych: Depressed mood  Labs    Chemistry Recent Labs  Lab 08/25/17 0002 08/26/17 0038  NA 136 137  K 4.9 3.7  CL 100* 99*  CO2 26 30  GLUCOSE 105* 90  BUN 30* 18  CREATININE 1.33* 1.27*  CALCIUM 8.7* 8.2*  PROT  --  5.3*  ALBUMIN  --  2.8*  AST  --  20  ALT  --  11*  ALKPHOS  --  39  BILITOT  --  0.9  GFRNONAA 35* 37*  GFRAA 41* 43*  ANIONGAP 10 8     Hematology Recent Labs  Lab 08/25/17 0002 08/26/17 0038  WBC 7.2 8.9  RBC 3.48* 3.57*  HGB 11.3* 11.4*  HCT 34.5* 35.7*  MCV 99.1 100.0  MCH 32.5 31.9  MCHC 32.8 31.9  RDW  14.1 14.2  PLT 163 214    Cardiac Enzymes Recent Labs  Lab 08/25/17 0127 08/25/17 1306  TROPONINI 1.01* 1.16*    Recent Labs  Lab 08/25/17 0011  TROPIPOC 0.33*     BNP Recent Labs  Lab 08/24/17 2347  BNP 211.0*     DDimer No results for input(s): DDIMER in the last 168 hours.   Radiology    Dg Chest 2 View  Result Date: 08/25/2017 CLINICAL DATA:  84 year old female with shortness of breath. History of right upper lobe carcinoma status post radiation treatment. EXAM: CHEST - 2 VIEW COMPARISON:  Chest CT dated 07/12/2017. FINDINGS: Focal opacity in the right upper lobe similar to prior CT consistent with radiation treatment. No new consolidative changes. There is no pleural effusion or pneumothorax. The cardiac silhouette is within normal limits. There is atherosclerotic calcification of the aorta.  Osteopenia with degenerative changes and scoliosis of the spine. No acute osseous pathology. IMPRESSION: 1. No acute cardiopulmonary process. 2. Post treatment changes of the right upper lobe. Electronically Signed   By: Anner Crete M.D.   On: 08/25/2017 01:05   Ct Angio Chest Pe W And/or Wo Contrast  Result Date: 08/25/2017 CLINICAL DATA:  Dyspnea since 20/2 100 hours. Left upper lobe lung cancer with radiation and lobectomy. COPD. EXAM: CT ANGIOGRAPHY CHEST WITH CONTRAST TECHNIQUE: Multidetector CT imaging of the chest was performed using the standard protocol during bolus administration of intravenous contrast. Multiplanar CT image reconstructions and MIPs were obtained to evaluate the vascular anatomy. CONTRAST:  74mL ISOVUE-370 IOPAMIDOL (ISOVUE-370) INJECTION 76% COMPARISON:  07/12/2017 and 10/24/2012 chest CT FINDINGS: Cardiovascular: Aortic and coronary arteriosclerosis. Borderline cardiomegaly without pericardial effusion. Ectatic thoracic aorta without aneurysm. Satisfactory opacification of the pulmonary arteries without pulmonary embolus. Mediastinum/Nodes: No mediastinal or hilar lymphadenopathy. Patent trachea and mainstem bronchi. Esophagus is unremarkable. Lungs/Pleura: Volume loss with new confluent area of atelectasis and bronchiectasis in the right upper lobe. Stable bibasilar scarring some which appears calcified in the left lower lobe. No pneumothorax. Stable nodularity in the medial right upper lobe adjacent to the major fissure measuring 7.2 x 5.9 mm, unchanged in appearance. Paraseptal adjacent emphysema is seen. Redemonstration of ovoid masslike opacity in the left lower lobe unchanged in appearance measuring 11.9 x 7.3 cm on series 6/60. No effusion or pneumothorax. Upper Abdomen: Splenic granulomas. No space-occupying mass of the liver nor included adrenal glands. Musculoskeletal: No aggressive nor suspicious osseous lesions. Review of the MIP images confirms the above findings.  IMPRESSION: 1. Cardiomegaly with coronary arteriosclerosis. Ectatic aorta without aneurysm. No acute pulmonary embolus. 2. New confluent area of atelectasis with bronchiectasis in the right upper lobe. 3. Stable bibasilar scarring with ovoid pulmonary nodular opacity measuring 11.9 x 7.3 mm in the left lower lobe consistent with a benign finding given stability dating back to 2014. Aortic Atherosclerosis (ICD10-I70.0). Electronically Signed   By: Ashley Royalty M.D.   On: 08/25/2017 03:02    Cardiac Studies   n/a  Patient Profile     84 y.o. cachectic female with long-standing rheumatoid arthritis, lung cancer in remission after XRT, COPD, presenting with non-STEMI, now with worsening ECG changes.  Atherosclerotic changes in the thoracic aorta and coronaries by CT as well as femoral arteries on previous US.  Assessment & Plan    1.  NSTEMI: Currently asymptomatic at rest.  ECG changes are out of proportion with the brevity of chest discomfort and the minor increase in cardiac enzymes.  There is a possibility that she  has a much larger area of myocardium in jeopardy, for example proximal LAD disease. Takotsubo sd is a less likely explanation.  Plan aspirin, statins, intravenous heparin, coronary angiography and revascularization as indicated today.  No beta-blockers due to bradycardia. 2.  Depression: Fairly obvious diagnosis in my opinion.  Will restart very low-dose of sertraline.  Needs to follow-up with primary provider after DC. 3.  Aortic atherosclerosis without aneurysm.  4.  RA: Disabling, probably explains her depression to some degree. Dr. Estanislado Pandy.  Note long-standing treatment with prednisone.  Need to be aware of possible iatrogenic adrenal suppression and need for stress dose corticosteroids if she becomes more acutely ill. 5.  COPD and Rt Lung CA in remission after XRT 2017 6. Ankle edema: Chronic problem, etiology uncertain, seems to be receding with diuretics.  7. CKD stage 3  For  questions or updates, please contact [facility] Please consult [email or web address] for contact info under Cardiology/STEMI.      Signed, Sanda Klein, MD  08/26/2017, 9:23 AM

## 2017-08-26 NOTE — CV Procedure (Signed)
2D echo attempted, set up machine, but patient had to use bedside toilet again. Will try echo at a later time.

## 2017-08-26 NOTE — Progress Notes (Signed)
  Echocardiogram 2D Echocardiogram has been performed.  [name] 08/26/2017, 3:33 PM

## 2017-08-27 LAB — CBC
HCT: 42.1 % (ref 36.0–46.0)
Hemoglobin: 13.5 g/dL (ref 12.0–15.0)
MCH: 32.1 pg (ref 26.0–34.0)
MCHC: 32.1 g/dL (ref 30.0–36.0)
MCV: 100.2 fL — ABNORMAL HIGH (ref 78.0–100.0)
Platelets: 177 10*3/uL (ref 150–400)
RBC: 4.2 MIL/uL (ref 3.87–5.11)
RDW: 14.5 % (ref 11.5–15.5)
WBC: 9.8 10*3/uL (ref 4.0–10.5)

## 2017-08-27 LAB — OCCULT BLOOD X 1 CARD TO LAB, STOOL: Fecal Occult Bld: POSITIVE — AB

## 2017-08-27 MED ORDER — CARVEDILOL 3.125 MG PO TABS: 3.1250 mg | ORAL_TABLET | Freq: Two times a day (BID) | ORAL | 3 refills | Status: DC

## 2017-08-27 MED ORDER — CARVEDILOL 3.125 MG PO TABS
3.1250 mg | ORAL_TABLET | Freq: Two times a day (BID) | ORAL | Status: DC
  Administered 2017-08-27 – 2017-08-29 (×5): 3.125 mg via ORAL
  Filled 2017-08-27 (×5): qty 1

## 2017-08-27 MED ORDER — SERTRALINE HCL 25 MG PO TABS: 25.0000 mg | ORAL_TABLET | Freq: Every day | ORAL | 1 refills | Status: DC

## 2017-08-27 NOTE — Discharge Summary (Addendum)
Discharge Summary    Patient ID: [name],  MRN: [id number], DOB/AGE: [birth year] 84 y.o.  Admit date: 08/24/2017 Discharge date: 08/29/2017  Primary Care Provider: Mayra Neer Primary Cardiologist: New to Dr. Sallyanne Kuster   Discharge Diagnoses    Active Problems:   NSTEMI (non-ST elevated myocardial infarction) [facility])   Aortic atherosclerosis without aneurysm  RA  Depression  COPA and TR lung cancer in remission  CKD stage III  Ankle edema  Positive stool occult  Allergies Allergies  Allergen Reactions  . Latex Other (See Comments)    Blisters on her mouth  . Boniva [Ibandronic Acid]     Muscle aches  . Darvocet [Propoxyphene N-Acetaminophen] Nausea And Vomiting  . Erythromycin Nausea And Vomiting  . Morphine And Related Nausea And Vomiting  . Oxycontin [Oxycodone Hcl] Nausea And Vomiting  . Sulfa Antibiotics Nausea And Vomiting  . Tramadol Nausea And Vomiting  . Vicodin [Hydrocodone-Acetaminophen]     dizzy    Diagnostic Studies/Procedures    LEFT HEART CATH AND CORONARY ANGIOGRAPHY  08/26/17  Conclusion     Mid LAD lesion is 30% stenosed.  The left ventricular systolic function is normal.  LV end diastolic pressure is normal.  The left ventricular ejection fraction is 55-65% by visual estimate.   1. No significant obstructive CAD 2. Normal LV function 3. Normal LVEDP  Plan: medical therapy    Echo 08/26/17 Study Conclusions  - Left ventricle: The cavity size was normal. There was severe   concentric hypertrophy. Systolic function was vigorous. The   estimated ejection fraction was in the range of 65% to 70%. Wall   motion was normal; there were no regional wall motion   abnormalities. Doppler parameters are consistent with abnormal   left ventricular relaxation (grade 1 diastolic dysfunction). The   E/e&' ratio is between 8-15, suggesting indeterminate LV filling   pressure. - Aortic valve: Sclerosis without stenosis. There was  trivial   regurgitation. - Mitral valve: Calcified annulus. Mildly thickened leaflets .   There was trivial regurgitation. - Left atrium: The atrium was normal in size. - Inferior vena cava: The vessel was normal in size. The   respirophasic diameter changes were in the normal range (>= 50%),   consistent with normal central venous pressure.  Impressions:  - LVEF 65-70%, severe LVH, normal wall motion, grade 1 DD,   indeterminate LV filling pressure, aortic valve sclerosis with   trivial AI, trivial MR, normal LA size, normal IVC.    History of Present Illness     [name] is a 84 y.o. female with a history of lung CA in remission, COPD, HLD, RA, osteoporosis came to the ER 05/08 for SOB and nausea, troponin elevated and cards asked to admit.   Pt states she was fine up until PM of 08/24/17. After going to bed, she had sudden short of breath.  She also had nausea and describes herself as dripping with sweat.  EMS activated. She had a small amount of vomiting.  On the way to the hospital, she got some aspirin, and described 2 pills under her tongue, probably nitroglycerin.  The nitroglycerin helped her shortness of breath and nausea.  She quit sweating. Asymptomatic since then.  She has never had symptoms like this before.  She has never had a cardiology evaluation.   She uses a walker reliably.  She does not have a history of frequent falls.  She can go shopping and walk short distances without difficulty. She  has had some problems with lower extremity edema.  These improved when she started elevating her legs.  There is no history of orthopnea or PND.  Presenting syndrome highly compatible with an acute myocardial infarction. ECG shows very subtle nonspecific ST segment elevation in lead V1-V2 and T wave inversion in 1, aVL suggesting possible LAD artery disease (or diagonal or ramus intermedius) and peak troponin is only 1.1 so far. Admitted for further evaluation.   Hospital  Course     Consultants: None  1.  NSTEMI: Peak of troponin 1.16. ECG changes are out of proportion with the brevity of chest discomfort and the minor increase in cardiac enzymes.  CT angio of chest did not showed PE whowever had ectatic aorta without aneurysm and pulmonary nodules. Cath showed mild non obstructive CAD. Normal LVEF and LVEDP. Echo showed LVEF of 65-70% with severe LVH. No WM abnormality. Grade 1 DD, indeterminate LV filling pressure, aortic valve sclerosis with trivial AI, trivial MR, normal LA size, normal IVC. ? takatsubo but reassuring echo. Added low dose BB and follow up with PCP for further management.   2.  Depression: Fairly obvious diagnosis. Restarted very low-dose of sertraline.  Needs to follow-up with primary provider after DC.  3.  Aortic atherosclerosis without aneurysm.   4.  RA: Disabling, probably explains her depression to some degree. Follows by Dr. Estanislado Pandy.  Note long-standing treatment with prednisone.  Need to be aware of possible iatrogenic adrenal suppression and need for stress dose corticosteroids if she becomes more acutely ill.  5.  COPD and Rt Lung CA in remission after XRT 2017  6. Ankle edema: Chronic problem, etiology uncertain, normal LVEDP.   7. CKD stage 3: Scr stable  8. Anemia She had positive stool guaiac but hemoglobin remain fairly stable. Given no evidence of CAD, she will not need ASA. She will follow follow up with PCP for further work up for her + stool guaiac.   9. Deconditioning: Recommended SNF  The patient has been seen by Dr. Johnsie Cancel  today and deemed ready for discharge home. All follow-up appointments have been scheduled. Discharge medications are listed below.    Discharge Vitals Blood pressure (!) 140/43, pulse 66, temperature 98.6 F (37 C), temperature source Oral, resp. rate (!) 28, height 4\' 11"  (1.499 m), weight 93 lb 7.6 oz (42.4 kg), SpO2 90 %.  Filed Weights   08/27/17 0500 08/28/17 0500 08/29/17 0440    Weight: 93 lb 7.6 oz (42.4 kg) 92 lb 9.5 oz (42 kg) 93 lb 7.6 oz (42.4 kg)    Labs & Radiologic Studies     CBC Recent Labs    08/28/17 0520 08/29/17 0444  WBC 9.4 11.1*  HGB 9.8* 10.5*  HCT 31.4* 32.2*  MCV 100.3* 100.6*  PLT 194 218   BDg Chest 2 View  Result Date: 08/25/2017 CLINICAL DATA:  84 year old female with shortness of breath. History of right upper lobe carcinoma status post radiation treatment. EXAM: CHEST - 2 VIEW COMPARISON:  Chest CT dated 07/12/2017. FINDINGS: Focal opacity in the right upper lobe similar to prior CT consistent with radiation treatment. No new consolidative changes. There is no pleural effusion or pneumothorax. The cardiac silhouette is within normal limits. There is atherosclerotic calcification of the aorta. Osteopenia with degenerative changes and scoliosis of the spine. No acute osseous pathology. IMPRESSION: 1. No acute cardiopulmonary process. 2. Post treatment changes of the right upper lobe. Electronically Signed   By: Laren Everts.D.  On: 08/25/2017 01:05   Ct Angio Chest Pe W And/or Wo Contrast  Result Date: 08/25/2017 CLINICAL DATA:  Dyspnea since 20/2 100 hours. Left upper lobe lung cancer with radiation and lobectomy. COPD. EXAM: CT ANGIOGRAPHY CHEST WITH CONTRAST TECHNIQUE: Multidetector CT imaging of the chest was performed using the standard protocol during bolus administration of intravenous contrast. Multiplanar CT image reconstructions and MIPs were obtained to evaluate the vascular anatomy. CONTRAST:  6mL ISOVUE-370 IOPAMIDOL (ISOVUE-370) INJECTION 76% COMPARISON:  07/12/2017 and 10/24/2012 chest CT FINDINGS: Cardiovascular: Aortic and coronary arteriosclerosis. Borderline cardiomegaly without pericardial effusion. Ectatic thoracic aorta without aneurysm. Satisfactory opacification of the pulmonary arteries without pulmonary embolus. Mediastinum/Nodes: No mediastinal or hilar lymphadenopathy. Patent trachea and mainstem bronchi.  Esophagus is unremarkable. Lungs/Pleura: Volume loss with new confluent area of atelectasis and bronchiectasis in the right upper lobe. Stable bibasilar scarring some which appears calcified in the left lower lobe. No pneumothorax. Stable nodularity in the medial right upper lobe adjacent to the major fissure measuring 7.2 x 5.9 mm, unchanged in appearance. Paraseptal adjacent emphysema is seen. Redemonstration of ovoid masslike opacity in the left lower lobe unchanged in appearance measuring 11.9 x 7.3 cm on series 6/60. No effusion or pneumothorax. Upper Abdomen: Splenic granulomas. No space-occupying mass of the liver nor included adrenal glands. Musculoskeletal: No aggressive nor suspicious osseous lesions. Review of the MIP images confirms the above findings. IMPRESSION: 1. Cardiomegaly with coronary arteriosclerosis. Ectatic aorta without aneurysm. No acute pulmonary embolus. 2. New confluent area of atelectasis with bronchiectasis in the right upper lobe. 3. Stable bibasilar scarring with ovoid pulmonary nodular opacity measuring 11.9 x 7.3 mm in the left lower lobe consistent with a benign finding given stability dating back to 2014. Aortic Atherosclerosis (ICD10-I70.0). Electronically Signed   By: Ashley Royalty M.D.   On: 08/25/2017 03:02    Disposition   Pt is being discharged home today in good condition.  Follow-up Plans & Appointments     Contact information for follow-up providers    Mayra Neer, MD. Schedule an appointment as soon as possible for a visit in 5 day(s).   Specialty:  Family Medicine Why:  for post hospital follow up  Contact information: [address] [place] [address] [phone]            Contact information for after-discharge care    Destination    [facility] .   Service:  Skilled Nursing Contact information: [address] [place] [phone]                  Discharge Instructions    Diet - low sodium heart healthy   Complete by:  As directed    Diet - low sodium heart healthy   Complete by:  As directed    Discharge instructions   Complete by:  As directed    No driving for 72 hours. No lifting over 5 lbs for 2 week. No sexual activity for 1 week.  Keep procedure site clean & dry. If you notice increased pain, swelling, bleeding or pus, call/return!  You may shower, but no soaking baths/hot tubs/pools for 1 week.   Increase activity slowly   Complete by:  As directed    Increase activity slowly   Complete by:  As directed       Discharge Medications   Allergies as of 08/29/2017      Reactions   Latex Other (See Comments)  Blisters on her mouth   Boniva [ibandronic Acid]    Muscle aches   Darvocet [propoxyphene N-acetaminophen] Nausea And Vomiting   Erythromycin Nausea And Vomiting   Morphine And Related Nausea And Vomiting   Oxycontin [oxycodone Hcl] Nausea And Vomiting   Sulfa Antibiotics Nausea And Vomiting   Tramadol Nausea And Vomiting   Vicodin [hydrocodone-acetaminophen]    dizzy      Medication List    TAKE these medications   carvedilol 3.125 MG tablet Commonly known as:  COREG Take 1 tablet (3.125 mg total) by mouth 2 (two) times daily with a meal.   folic acid 1 MG tablet Commonly known as:  FOLVITE Take 1 tablet (1 mg total) by mouth daily.   furosemide 20 MG tablet Commonly known as:  LASIX Take 1 tablet (20 mg total) by mouth as needed. For SOB or ankle edema What changed:    when to take this  reasons to take this  additional instructions   predniSONE 5 MG tablet Commonly known as:  DELTASONE Take 1 tablet (5 mg total) by mouth daily with breakfast.   sertraline 25 MG tablet Commonly known as:  ZOLOFT Take 1 tablet (25 mg total) by mouth daily.   traMADol 50 MG tablet Commonly known as:  ULTRAM Take 50 mg by mouth every 6 (six) hours as needed for moderate pain.        Aspirin  prescribed at discharge?  N/A High Intensity Statin Prescribed? (Lipitor 40-80mg  or Crestor 20-40mg ): N/A Beta Blocker Prescribed? Yes For EF 45% or less, Was ACEI/ARB Prescribed? N/A ADP Receptor Inhibitor Prescribed? (i.e. Plavix etc.-Includes Medically Managed Patients): N/A For EF <40%, Aldosterone Inhibitor Prescribed? N/A Was EF assessed during THIS hospitalization? Yes Was Cardiac Rehab II ordered? (Included Medically managed Patients): N/A  Outstanding Labs/Studies   None  Duration of Discharge Encounter   Greater than 30 minutes including physician time.  Signed, Crista Luria Fishel Wamble PA-C 08/29/2017, 3:11 PM

## 2017-08-27 NOTE — Evaluation (Signed)
Physical Therapy Evaluation Patient Details Name: [name] MRN: [id number] DOB: 03/07/1930 Today's Date: 08/27/2017   History of Present Illness  Roseana Rhine a 84 y.o.femalewith a history of lung CA in remission, COPD, HLD, RA, osteoporosis came to the ER 05/08 for SOB and nausea, troponin elevated and cards asked to admit.Admit with SOB and Nausea.  MD questioning if pt has Takutsobo.    Clinical Impression  Pt admitted with above diagnosis. Pt currently with functional limitations due to the deficits listed below (see PT Problem List). Pt needing min to mod assist for mobility for transfers and unable to walk without mod assist and RW and chair follow. Only ambulated 4 feet due to desat to 79% on RA.  Will benefit from SNF at d/c as pt very deconditioned.  Will follow acutely.   Pt will benefit from skilled PT to increase their independence and safety with mobility to allow discharge to the venue listed below.      Follow Up Recommendations SNF;Supervision/Assistance - 24 hour    Equipment Recommendations  None recommended by PT    Recommendations for Other Services       Precautions / Restrictions Precautions Precautions: Fall Restrictions Weight Bearing Restrictions: No      Mobility  Bed Mobility Overal bed mobility: Needs Assistance Bed Mobility: Supine to Sit     Supine to sit: Independent     General bed mobility comments: Incr time and DOE with movement to EOB 3/4.    Transfers Overall transfer level: Needs assistance Equipment used: 1 person hand held assist;Rolling walker (2 wheeled) Transfers: Sit to/from [facility] Sit to Stand: Min assist Stand pivot transfers: Min assist       General transfer comment: Pt pivoted to 3N1 needing min assist for balance.  Pt with positive blood in BM and notified nursing.    Ambulation/Gait Ambulation/Gait assistance: Min assist;+2 safety/equipment;Mod assist Ambulation Distance (Feet): 4  Feet Assistive device: Rolling walker (2 wheeled) Gait Pattern/deviations: Step-to pattern;Decreased step length - right;Decreased step length - left;Decreased stride length;Staggering left;Staggering right;Wide base of support;Antalgic;Trunk flexed   Gait velocity interpretation: <1.31 ft/sec, indicative of household ambulator General Gait Details: Pt flexed at trunk with poor postural stability.  Needed min to mod assist for safety with gait with RW.  Poor endurance with need for chair follow and pt desats to 79% on RA with activity.   Stairs            Wheelchair Mobility    Modified Rankin (Stroke Patients Only)       Balance Overall balance assessment: Needs assistance Sitting-balance support: No upper extremity supported;Feet supported Sitting balance-Leahy Scale: Fair     Standing balance support: Bilateral upper extremity supported;During functional activity Standing balance-Leahy Scale: Poor Standing balance comment: relies on UE support                             Pertinent Vitals/Pain Pain Assessment: No/denies pain  Sats at rest 94% on RA but drop to 79% with activity on RA.   Home Living Family/patient expects to be discharged to:: Private residence Living Arrangements: Alone Available Help at Discharge: Available PRN/intermittently Type of Home: Apartment Home Access: [place]: One level Home Equipment: Environmental consultant - 4 wheels;Cane - single point;Bedside commode;Shower seat;Grab bars - toilet      Prior Function Level of Independence: Independent with assistive device(s);Needs assistance   Gait / Transfers Assistance  Needed: Ambulated with rollator   ADL's / Homemaking Assistance Needed: Did what she could sink bath per pt  Comments: Meals on Wheels     Hand Dominance   Dominant Hand: Right    Extremity/Trunk Assessment   Upper Extremity Assessment Upper Extremity Assessment: Defer to OT evaluation    Lower  Extremity Assessment Lower Extremity Assessment: Generalized weakness    Cervical / Trunk Assessment Cervical / Trunk Assessment: Kyphotic  Communication   Communication: No difficulties  Cognition Arousal/Alertness: Awake/alert Behavior During Therapy: Impulsive;Anxious Overall Cognitive Status: History of cognitive impairments - at baseline                                 General Comments: Poor overall safety awareness.        General Comments      Exercises     Assessment/Plan    PT Assessment Patient needs continued PT services  PT Problem List Decreased activity tolerance;Decreased balance;Decreased mobility;Decreased knowledge of use of DME;Decreased safety awareness;Decreased knowledge of precautions;Cardiopulmonary status limiting activity       PT Treatment Interventions DME instruction;Gait training;Functional mobility training;Therapeutic activities;Therapeutic exercise;Balance training;Patient/family education;Cognitive remediation    PT Goals (Current goals can be found in the Care Plan section)  Acute Rehab PT Goals Patient Stated Goal: to get stronger PT Goal Formulation: With patient Time For Goal Achievement: 09/10/17 Potential to Achieve Goals: Good    Frequency Min 2X/week   Barriers to discharge Decreased caregiver support      Co-evaluation               AM-PAC PT "6 Clicks" Daily Activity  Outcome Measure Difficulty turning over in bed (including adjusting bedclothes, sheets and blankets)?: None Difficulty moving from lying on back to sitting on the side of the bed? : None Difficulty sitting down on and standing up from a chair with arms (e.g., wheelchair, bedside commode, etc,.)?: A Little Help needed moving to and from a bed to chair (including a wheelchair)?: A Lot Help needed walking in hospital room?: A Lot Help needed climbing 3-5 steps with a railing? : Total 6 Click Score: 16    End of Session Equipment  Utilized During Treatment: Gait belt Activity Tolerance: Patient limited by fatigue Patient left: in bed;with call bell/phone within reach;with bed alarm set Nurse Communication: Mobility status PT Visit Diagnosis: Muscle weakness (generalized) (M62.81);History of falling (Z91.81);Unsteadiness on feet (R26.81)    Time: 4174-0814 PT Time Calculation (min) (ACUTE ONLY): 23 min   Charges:   PT Evaluation $PT Eval Moderate Complexity: 1 Mod PT Treatments $Therapeutic Activity: 8-22 mins   PT G Codes:        Grier Vu,PT Acute Rehabilitation [phone] (pager)   Denice Paradise 08/27/2017, 1:29 PM

## 2017-08-27 NOTE — Progress Notes (Signed)
Progress Note  Patient Name: [name] Date of Encounter: 08/27/2017  Primary Cardiologist: No primary care provider on file.   Subjective   No chest pain some bruising in both arms especially right cat sight   Inpatient Medications    Scheduled Meds: . aspirin EC  81 mg Oral Daily  . atorvastatin  80 mg Oral q1800  . furosemide  20 mg Oral Daily  . heparin  5,000 Units Subcutaneous Q8H  . sertraline  25 mg Oral Daily  . sodium chloride flush  3 mL Intravenous Q12H  . sodium chloride flush  3 mL Intravenous Q12H   Continuous Infusions: . sodium chloride    . sodium chloride     PRN Meds: sodium chloride, sodium chloride, acetaminophen, ALPRAZolam, loperamide, nitroGLYCERIN, ondansetron (ZOFRAN) IV, sodium chloride flush, sodium chloride flush, traMADol, zolpidem   Vital Signs    Vitals:   08/26/17 1027 08/26/17 2036 08/26/17 2200 08/27/17 0500  BP: (!) 122/49 (!) 147/55  (!) 152/57  Pulse: 60 71 82 66  Resp: 15 (!) 26 18 (!) 22  Temp:  98.7 F (37.1 C)  99.1 F (37.3 C)  TempSrc:  Oral  Oral  SpO2: 96% 99% 96% 97%  Weight:    93 lb 7.6 oz (42.4 kg)  Height:        Intake/Output Summary (Last 24 hours) at 08/27/2017 0912 Last data filed at 08/26/2017 1200 Gross per 24 hour  Intake 240 ml  Output -  Net 240 ml   Filed Weights   08/24/17 2335 08/26/17 0423 08/27/17 0500  Weight: 98 lb (44.5 kg) 93 lb 11.1 oz (42.5 kg) 93 lb 7.6 oz (42.4 kg)    Telemetry    NSR, mild sinus bradycardia - Personally Reviewed  ECG    Normal sinus rhythm, posterior fascicular block, nonspecific ST segment elevation across the anterior and inferior leads, moderately prolonged QTC 492 ms - Personally Reviewed  Physical Exam  Appears very old and frail, GEN: No acute distress.   Neck: No JVD Cardiac: RRR, no murmurs, rubs, or gallops.  Respiratory: Clear to auscultation bilaterally. GI: Soft, nontender, non-distended  MS:  1-2+ left ankle edema, soft; ulnar deviation  and finger deformities consistent with arthritis Neuro:  Nonfocal  Psych: Depressed mood Right arm with echymosis and bruising up to elbow hand wam and well perfused  Labs    Chemistry Recent Labs  Lab 08/25/17 0002 08/26/17 0038  NA 136 137  K 4.9 3.7  CL 100* 99*  CO2 26 30  GLUCOSE 105* 90  BUN 30* 18  CREATININE 1.33* 1.27*  CALCIUM 8.7* 8.2*  PROT  --  5.3*  ALBUMIN  --  2.8*  AST  --  20  ALT  --  11*  ALKPHOS  --  39  BILITOT  --  0.9  GFRNONAA 35* 37*  GFRAA 41* 43*  ANIONGAP 10 8     Hematology Recent Labs  Lab 08/25/17 0002 08/26/17 0038 08/27/17 0514  WBC 7.2 8.9 9.8  RBC 3.48* 3.57* 4.20  HGB 11.3* 11.4* 13.5  HCT 34.5* 35.7* 42.1  MCV 99.1 100.0 100.2*  MCH 32.5 31.9 32.1  MCHC 32.8 31.9 32.1  RDW 14.1 14.2 14.5  PLT 163 214 177    Cardiac Enzymes Recent Labs  Lab 08/25/17 0127 08/25/17 1306  TROPONINI 1.01* 1.16*    Recent Labs  Lab 08/25/17 0011  TROPIPOC 0.33*     BNP Recent Labs  Lab 08/24/17 2347  BNP 211.0*     DDimer No results for input(s): DDIMER in the last 168 hours.   Radiology    No results found.  Cardiac Studies   n/a  Patient Profile     84 y.o. cachectic female with long-standing rheumatoid arthritis, lung cancer in remission after XRT, COPD, presenting with non-STEMI, now with worsening ECG changes.  Atherosclerotic changes in the thoracic aorta and coronaries by CT as well as femoral arteries on previous US.  Assessment & Plan    1.  NSTEMI: ? takatsubo no CAD at cath and echo with normal EF no RWMA;s no need for DAT add low dose beta blocker  2.  Depression: significant sertraline restarted  3.  Aortic atherosclerosis without aneurysm.  4.  RA: Disabling, probably explains her depression to some degree. Dr. Estanislado Pandy.  Note long-standing treatment with prednisone.  Need to be aware of possible iatrogenic adrenal suppression and need for stress dose corticosteroids if she becomes more acutely  ill. 5.  COPD and Rt Lung CA in remission after XRT 2017 6. Ankle edema: Chronic problem, etiology uncertain, seems to be receding with diuretics.  7. CKD stage 3  D/c home outpatient f/u with Dr Brigitte Pulse   For questions or updates, please contact [facility] Please consult [email or web address] for contact info under Cardiology/STEMI.      Signed, Jenkins Rouge, MD  08/27/2017, 9:12 AM

## 2017-08-27 NOTE — Progress Notes (Signed)
Patient slept most of the night but has been showing signs of being anxious and worry with what is happening to her.  I have spoken to patient and explained several things to her to calm her down.  Her right arm bruising is being monitored, so far, the bruising has spread since day shift and it is still tender at the site.  I will keep monitoring the patient.

## 2017-08-28 LAB — CBC
HCT: 31.4 % — ABNORMAL LOW (ref 36.0–46.0)
Hemoglobin: 9.8 g/dL — ABNORMAL LOW (ref 12.0–15.0)
MCH: 31.3 pg (ref 26.0–34.0)
MCHC: 31.2 g/dL (ref 30.0–36.0)
MCV: 100.3 fL — ABNORMAL HIGH (ref 78.0–100.0)
Platelets: 194 10*3/uL (ref 150–400)
RBC: 3.13 MIL/uL — ABNORMAL LOW (ref 3.87–5.11)
RDW: 14.4 % (ref 11.5–15.5)
WBC: 9.4 10*3/uL (ref 4.0–10.5)

## 2017-08-28 NOTE — Plan of Care (Signed)
Pt remains anxious, quiet environment maintained, family members informed of treatments and plan of care, frequent assessments for needs, call light in reach.

## 2017-08-28 NOTE — Progress Notes (Signed)
Progress Note  Patient Name: [name] Date of Encounter: 08/28/2017  Primary Cardiologist: No primary care provider on file.   Subjective   No chest pain apparently d/c cancelled and social services looking for SNF Notes indicate desat with ambulation but patient says she was not walked yesterday  Inpatient Medications    Scheduled Meds: . aspirin EC  81 mg Oral Daily  . atorvastatin  80 mg Oral q1800  . carvedilol  3.125 mg Oral BID WC  . heparin  5,000 Units Subcutaneous Q8H  . sertraline  25 mg Oral Daily  . sodium chloride flush  3 mL Intravenous Q12H  . sodium chloride flush  3 mL Intravenous Q12H   Continuous Infusions: . sodium chloride    . sodium chloride     PRN Meds: sodium chloride, sodium chloride, acetaminophen, ALPRAZolam, loperamide, nitroGLYCERIN, ondansetron (ZOFRAN) IV, sodium chloride flush, sodium chloride flush, traMADol, zolpidem   Vital Signs    Vitals:   08/27/17 1437 08/27/17 1758 08/27/17 2035 08/28/17 0500  BP: (!) 165/75 (!) 152/48 (!) 117/52 (!) 150/50  Pulse: 85 88 66 64  Resp:   (!) 24 18  Temp: 98.7 F (37.1 C)  99.3 F (37.4 C) 98.3 F (36.8 C)  TempSrc: Oral  Oral Oral  SpO2: 92%  100% 100%  Weight:    92 lb 9.5 oz (42 kg)  Height:       No intake or output data in the 24 hours ending 08/28/17 0848 Filed Weights   08/26/17 0423 08/27/17 0500 08/28/17 0500  Weight: 93 lb 11.1 oz (42.5 kg) 93 lb 7.6 oz (42.4 kg) 92 lb 9.5 oz (42 kg)    Telemetry    NSR, mild sinus bradycardia - Personally Reviewed  ECG    Normal sinus rhythm, posterior fascicular block, nonspecific ST segment elevation across the anterior and inferior leads, moderately prolonged QTC 492 ms - Personally Reviewed  Physical Exam  Appears very old and frail, GEN: No acute distress.   Neck: No JVD Cardiac: RRR, no murmurs, rubs, or gallops.  Respiratory: Clear to auscultation bilaterally. GI: Soft, nontender, non-distended  MS:  1-2+ left ankle  edema, soft; ulnar deviation and finger deformities consistent with arthritis Neuro:  Nonfocal  Psych: Depressed mood Right arm with echymosis and bruising up to elbow hand wam and well perfused  Labs    Chemistry Recent Labs  Lab 08/25/17 0002 08/26/17 0038  NA 136 137  K 4.9 3.7  CL 100* 99*  CO2 26 30  GLUCOSE 105* 90  BUN 30* 18  CREATININE 1.33* 1.27*  CALCIUM 8.7* 8.2*  PROT  --  5.3*  ALBUMIN  --  2.8*  AST  --  20  ALT  --  11*  ALKPHOS  --  39  BILITOT  --  0.9  GFRNONAA 35* 37*  GFRAA 41* 43*  ANIONGAP 10 8     Hematology Recent Labs  Lab 08/26/17 0038 08/27/17 0514 08/28/17 0520  WBC 8.9 9.8 9.4  RBC 3.57* 4.20 3.13*  HGB 11.4* 13.5 9.8*  HCT 35.7* 42.1 31.4*  MCV 100.0 100.2* 100.3*  MCH 31.9 32.1 31.3  MCHC 31.9 32.1 31.2  RDW 14.2 14.5 14.4  PLT 214 177 194    Cardiac Enzymes Recent Labs  Lab 08/25/17 0127 08/25/17 1306  TROPONINI 1.01* 1.16*    Recent Labs  Lab 08/25/17 0011  TROPIPOC 0.33*     BNP Recent Labs  Lab 08/24/17 2347  BNP 211.0*  DDimer No results for input(s): DDIMER in the last 168 hours.   Radiology    No results found.  Cardiac Studies   n/a  Patient Profile     84 y.o. cachectic female with long-standing rheumatoid arthritis, lung cancer in remission after XRT, COPD, presenting with non-STEMI, now with worsening ECG changes.  Atherosclerotic changes in the thoracic aorta and coronaries by CT as well as femoral arteries on previous US.  Assessment & Plan    1.  NSTEMI: ? takatsubo no CAD at cath and echo with normal EF no RWMA;s no need for DAT add low dose beta blocker  2.  Depression: significant sertraline restarted  3.  Aortic atherosclerosis without aneurysm.  4.  RA: Disabling, probably explains her depression to some degree. Dr. Estanislado Pandy.  Note long-standing treatment with prednisone.  Need to be aware of possible iatrogenic adrenal suppression and need for stress dose corticosteroids if  she becomes more acutely ill. 5.  COPD and Rt Lung CA in remission after XRT 2017 6. Ankle edema: Chronic problem, etiology uncertain, seems to be receding with diuretics.  7. CKD stage 3  SNF placement if this is going to take time would transfer to hospitalist service in am    For questions or updates, please contact [place] Please consult [email or web address] for contact info under Cardiology/STEMI.      Signed, Jenkins Rouge, MD  08/28/2017, 8:48 AM

## 2017-08-29 LAB — CBC
HCT: 32.2 % — ABNORMAL LOW (ref 36.0–46.0)
Hemoglobin: 10.5 g/dL — ABNORMAL LOW (ref 12.0–15.0)
MCH: 32.8 pg (ref 26.0–34.0)
MCHC: 32.6 g/dL (ref 30.0–36.0)
MCV: 100.6 fL — ABNORMAL HIGH (ref 78.0–100.0)
Platelets: 218 10*3/uL (ref 150–400)
RBC: 3.2 MIL/uL — ABNORMAL LOW (ref 3.87–5.11)
RDW: 14.3 % (ref 11.5–15.5)
WBC: 11.1 10*3/uL — ABNORMAL HIGH (ref 4.0–10.5)

## 2017-08-29 MED ORDER — FUROSEMIDE 20 MG PO TABS: 20.0000 mg | ORAL_TABLET | ORAL | 1 refills | Status: DC | PRN

## 2017-08-29 NOTE — Progress Notes (Signed)
CSW spoke to patient at bedside and daughter Barney Drain via phone - full assessment to follow. Updated PT within 24 hours and an OT evaluation are required to complete patient's [facility] authorization for SNF. Paged MD for orders. CSW sending available clinicals to [facility] for review. CSW to follow.  Estanislado Emms, [place]

## 2017-08-29 NOTE — Clinical Social Work Placement (Signed)
   CLINICAL SOCIAL WORK PLACEMENT  NOTE  Date:  08/29/2017  Patient Details  Name: [name] MRN: [id number] Date of Birth: 1929-08-28  Clinical Social Work is seeking post-discharge placement for this patient at the [place] level of care (*CSW will initial, date and re-position this form in  chart as items are completed):  Yes   Patient/family provided with [facility] Work Department's list of facilities offering this level of care within the geographic area requested by the patient (or if unable, by the patient's family).  Yes   Patient/family informed of their freedom to choose among providers that offer the needed level of care, that participate in Medicare, Medicaid or managed care program needed by the patient, have an available bed and are willing to accept the patient.  Yes   Patient/family informed of [facility]'s ownership interest in [facility] and [facility], as well as of the fact that they are under no obligation to receive care at these facilities.  PASRR submitted to EDS on       PASRR number received on       Existing PASRR number confirmed on 08/29/17     FL2 transmitted to all facilities in geographic area requested by pt/family on 08/29/17     FL2 transmitted to all facilities within larger geographic area on       Patient informed that his/her managed care company has contracts with or will negotiate with certain facilities, including the following:        Yes   Patient/family informed of bed offers received.  Patient chooses bed at [facility] and patient chooses bed at      Patient to be transferred to [facility] on 08/29/17.  Patient to be transferred to facility by ptar     Patient family notified on 08/29/17 of transfer.  Name of family member notified:  pt daughter  Barney Drain is aware      PHYSICIAN       Additional Comment:     _______________________________________________ Wende Neighbors, LCSW 08/29/2017, 3:39 PM

## 2017-08-29 NOTE — Progress Notes (Signed)
Physical Therapy Treatment Patient Details Name: [name] MRN: [id number] DOB: Oct 17, 1929 Today's Date: 08/29/2017    History of Present Illness [name] a 84 y.o.femalewith a history of lung CA in remission, COPD, HLD, RA, osteoporosis came to the ER 05/08 for SOB and nausea, troponin elevated and cards asked to admit.Admit with SOB and Nausea.  MD questioning if pt has Tak[place]sobo.      PT Comments    Patient seen for activity progression. Tolerated multiple sit <> stands and minimal in room ambulation. Remains limited be weakness and fatigue. Current POC remains appropriate. SNF upon acute discharge. Patient in agreement. Will continue to see and progress as tolerated.   Follow Up Recommendations  SNF;Supervision/Assistance - 24 hour     Equipment Recommendations  None recommended by PT    Recommendations for Other Services       Precautions / Restrictions Precautions Precautions: Fall Restrictions Weight Bearing Restrictions: No    Mobility  Bed Mobility Overal bed mobility: Needs Assistance Bed Mobility: Supine to Sit;Sit to Supine     Supine to sit: Modified independent (Device/Increase time) Sit to supine: Modified independent (Device/Increase time)   General bed mobility comments: increased time to come to EOB, no physical assist required  Transfers Overall transfer level: Needs assistance Equipment used: 1 person hand held assist Transfers: Sit to/from [employer] Transfers Sit to Stand: Min assist         General transfer comment: Min assist for stability to power up from multiple surfaces  Ambulation/Gait Ambulation/Gait assistance: Min assist Ambulation Distance (Feet): 18 Feet Assistive device: 1 person hand held assist Gait Pattern/deviations: Step-to pattern;Decreased step length - right;Decreased step length - left;Decreased stride length;Staggering left;Staggering right;Wide base of support;Antalgic;Trunk flexed Gait  velocity: decreased Gait velocity interpretation: <1.31 ft/sec, indicative of household ambulator General Gait Details: Patient with flexed posture and noted instbaility, limited by fatigue. Patient very deconditioned and required hands on assist   Stairs             Wheelchair Mobility    Modified Rankin (Stroke Patients Only)       Balance Overall balance assessment: Needs assistance Sitting-balance support: No upper extremity supported;Feet supported Sitting balance-Leahy Scale: Fair     Standing balance support: Bilateral upper extremity supported;During functional activity Standing balance-Leahy Scale: Poor Standing balance comment: relies on UE support                            Cognition Arousal/Alertness: Awake/alert Behavior During Therapy: WFL for tasks assessed/performed Overall Cognitive Status: History of cognitive impairments - at baseline                                        Exercises      General Comments        Pertinent Vitals/Pain Pain Assessment: Faces Faces Pain Scale: Hurts little more Pain Location: right forarm Pain Descriptors / Indicators: Sore Pain Intervention(s): Monitored during session    Home Living                      Prior Function            PT Goals (current goals can now be found in the care plan section) Acute Rehab PT Goals Patient Stated Goal: to get stronger PT Goal Formulation: With patient Time For Goal Achievement:  09/10/17 Potential to Achieve Goals: Good Progress towards PT goals: Progressing toward goals    Frequency    Min 2X/week      PT Plan Current plan remains appropriate    Co-evaluation              AM-PAC PT "6 Clicks" Daily Activity  Outcome Measure  Difficulty turning over in bed (including adjusting bedclothes, sheets and blankets)?: None Difficulty moving from lying on back to sitting on the side of the bed? : None Difficulty  sitting down on and standing up from a chair with arms (e.g., wheelchair, bedside commode, etc,.)?: A Little Help needed moving to and from a bed to chair (including a wheelchair)?: A Lot Help needed walking in hospital room?: A Lot Help needed climbing 3-5 steps with a railing? : Total 6 Click Score: 16    End of Session Equipment Utilized During Treatment: Gait belt Activity Tolerance: Patient limited by fatigue Patient left: in bed;with call bell/phone within reach;with bed alarm set Nurse Communication: Mobility status PT Visit Diagnosis: Muscle weakness (generalized) (M62.81);History of falling (Z91.81);Unsteadiness on feet (R26.81)     Time: 6761-9509 PT Time Calculation (min) (ACUTE ONLY): 19 min  Charges:  $Therapeutic Activity: 8-22 mins                    G Codes:       Alben Deeds, PT DPT  Board Certified Neurologic Specialist [place] 08/29/2017, 2:55 PM

## 2017-08-29 NOTE — Progress Notes (Signed)
Clinical Social Worker facilitated patient discharge including contacting patient family and facility to confirm patient discharge plans.  Clinical information faxed to facility and family agreeable with plan.  CSW arranged ambulance transport via [facility] to [facility]  .  RN to call [phone] (pt will go in room 224) for report prior to discharge.  Clinical Social Worker will sign off for now as social work intervention is no longer needed. Please consult [place] again if new need arises.  Rhea Pink, MSW, [employer]

## 2017-08-29 NOTE — Evaluation (Signed)
Occupational Therapy Evaluation Patient Details Name: [name] MRN: [id number] DOB: [birth year] Today's Date: 08/29/2017    History of Present Illness Skylinn Vialpando a 84 y.o.femalewith a history of lung CA in remission, COPD, HLD, RA, osteoporosis came to the ER 05/08 for SOB and nausea, troponin elevated and cards asked to admit.Admit with SOB and Nausea.  MD questioning if pt has Tak[place]sobo.     Clinical Impression   PTA, pt was living alone and was independent with ADLs and functional mobility with rollator. Pt currently requiring Min A for LB ADLs and Min A for functional mobility. Pt presenting with decreased activity tolerance, balance, strength, and cognition. Recommend dc to SNF for further OT to optimize safety, independence with ADLs, and return to PLOF. Pt planning for dc later today. All further OT needs may be met at next venue and will sign off. Thank you.      Follow Up Recommendations  SNF;Supervision/Assistance - 24 hour    Equipment Recommendations  None recommended by OT    Recommendations for Other Services       Precautions / Restrictions Precautions Precautions: Fall Restrictions Weight Bearing Restrictions: No      Mobility Bed Mobility Overal bed mobility: Needs Assistance Bed Mobility: Supine to Sit;Sit to Supine     Supine to sit: Modified independent (Device/Increase time) Sit to supine: Modified independent (Device/Increase time)   General bed mobility comments: increased time to come to EOB, no physical assist required  Transfers Overall transfer level: Needs assistance Equipment used: Rolling walker (2 wheeled) Transfers: Sit to/from [employer] Sit to Stand: Min assist Stand pivot transfers: Min guard       General transfer comment: Min assist for stability to power up from multiple surfaces    Balance Overall balance assessment: Needs assistance Sitting-balance support: No upper extremity supported;Feet  supported Sitting balance-Leahy Scale: Fair     Standing balance support: Bilateral upper extremity supported;During functional activity Standing balance-Leahy Scale: Poor Standing balance comment: relies on UE support                           ADL either performed or assessed with clinical judgement   ADL Overall ADL's : Needs assistance/impaired Eating/Feeding: Set up;Sitting   Grooming: Set up;Sitting   Upper Body Bathing: Minimal assistance;Sitting   Lower Body Bathing: Minimal assistance;Sit to/from stand   Upper Body Dressing : Sitting;Set up;Supervision/safety Upper Body Dressing Details (indicate cue type and reason): Pt donning shirt at EOB with set up and supervision. VCs for sequencing Lower Body Dressing: Minimal assistance;Sit to/from stand Lower Body Dressing Details (indicate cue type and reason): Min A to don pants over feet (LLE with decreased ROM). Educating pt on donning LLE first to increase independence.  Toilet Transfer: Min Geophysical data processor Details (indicate cue type and reason): Pt performing stand pivot with Min Guard A for safety Toileting- Clothing Manipulation and Hygiene: Min guard;Sit to/from stand       Functional mobility during ADLs: Min guard General ADL Comments: Pt demonstrating decreased activity tolerance and functional performance.     Vision Baseline Vision/History: Wears glasses Wears Glasses: Reading only Patient Visual Report: No change from baseline       Perception     Praxis      Pertinent Vitals/Pain Pain Assessment: Faces Faces Pain Scale: Hurts a little bit Pain Location: Neck Pain Descriptors / Indicators: Sore Pain Intervention(s): Monitored during session     Hand Dominance  Right   Extremity/Trunk Assessment Upper Extremity Assessment Upper Extremity Assessment: Generalized weakness   Lower Extremity Assessment Lower Extremity Assessment: Generalized weakness   Cervical  / Trunk Assessment Cervical / Trunk Assessment: Kyphotic   Communication Communication Communication: No difficulties   Cognition Arousal/Alertness: Awake/alert Behavior During Therapy: WFL for tasks assessed/performed Overall Cognitive Status: History of cognitive impairments - at baseline                                 General Comments: Pt stating "ok, what's next after each step in a task." Pt repeating herself throughout session and requiring increased time for processing   General Comments       Exercises     Shoulder Instructions      Home Living Family/patient expects to be discharged to:: Private residence Living Arrangements: Alone Available Help at Discharge: Available PRN/intermittently Type of Home: Apartment Home Access: Elevator     Home Layout: One level     Bathroom Shower/Tub: Teacher, early years/pre: Standard     Home Equipment: Environmental consultant - 4 wheels;Cane - single point;Bedside commode;Shower seat;Grab bars - toilet   Additional Comments: Reports she lives at a retirement community      Prior Functioning/Environment Level of Independence: Independent with assistive device(s);Needs assistance  Gait / Transfers Assistance Needed: Ambulated with rollator  ADL's / Homemaking Assistance Needed: Did what she could sink bath per pt   Comments: Meals on Wheels        OT Problem List: Decreased strength;Decreased range of motion;Decreased activity tolerance;Impaired balance (sitting and/or standing);Decreased cognition;Decreased safety awareness;Decreased knowledge of use of DME or AE;Decreased knowledge of precautions      OT Treatment/Interventions:      OT Goals(Current goals can be found in the care plan section) Acute Rehab OT Goals Patient Stated Goal: to get stronger OT Goal Formulation: All assessment and education complete, DC therapy  OT Frequency:     Barriers to D/C:            Co-evaluation               AM-PAC PT "6 Clicks" Daily Activity     Outcome Measure Help from another person eating meals?: A Little Help from another person taking care of personal grooming?: A Little Help from another person toileting, which includes using toliet, bedpan, or urinal?: A Little Help from another person bathing (including washing, rinsing, drying)?: A Little Help from another person to put on and taking off regular upper body clothing?: A Little Help from another person to put on and taking off regular lower body clothing?: A Little 6 Click Score: 18   End of Session Equipment Utilized During Treatment: Rolling walker Nurse Communication: Mobility status  Activity Tolerance: Patient tolerated treatment well Patient left: in bed;with call bell/phone within reach;with nursing/sitter in room  OT Visit Diagnosis: Unsteadiness on feet (R26.81);Other abnormalities of gait and mobility (R26.89);Muscle weakness (generalized) (M62.81);Other symptoms and signs involving cognitive function                Time: 6387-5643 OT Time Calculation (min): 25 min Charges:  OT General Charges $OT Visit: 1 Visit OT Evaluation $OT Eval Moderate Complexity: 1 Mod OT Treatments $Self Care/Home Management : 8-22 mins G-Codes:     Keshun Berrett MSOT, OTR/L Acute Rehab Pager: [phone] Office: [place] 08/29/2017, 5:06 PM

## 2017-08-29 NOTE — Progress Notes (Signed)
Transferred out via stretcher by [facility]; awake, alert, and conversant. Called Daughter Zehmel  @ [phone] to notify but got voice mail. Message not left.

## 2017-08-29 NOTE — Progress Notes (Signed)
Report called to Theresia Bough, LPN [facility] receiving  Nurse. Questions answered. Awaiting PT[facility] to transfer patient out.

## 2017-08-29 NOTE — Clinical Social Work Note (Signed)
Clinical Social Work Assessment  Patient Details  Name: [name] MRN: [id number] Date of Birth: 03/13/1930  Date of referral:  08/29/17               Reason for consult:  Facility Placement, Discharge Planning                Permission sought to share information with:  Facility Sport and exercise psychologist, Family Supports Permission granted to share information::  Yes, Verbal Permission Granted  Name::     Lawyer::  SNFs  Relationship::  Daughter   Contact Information:  [phone]  Housing/Transportation Living arrangements for the past 2 months:  Single Family Home Source of Information:  Patient Patient Interpreter Needed:  None Criminal Activity/Legal Involvement Pertinent to Current Situation/Hospitalization:  No - Comment as needed Significant Relationships:  Adult Children Lives with:  Self Do you feel safe going back to the place where you live?  No Need for family participation in patient care:  Yes (Comment)  Care giving concerns: Patient from home. PT recommending SNF.   Social Worker assessment / plan: CSW met with patient at bedside. Patient alert and oriented. CSW introduced self and role and discussed PT recommendation for SNF. Patient agreeable to SNF and hopeful for [facility] or [facility]. CSW sent out initial referrals and [facility] offered bed. No beds at [facility].   Patient agreeable to [facility]. CSW started insurance authorization for SNF with [employer]. Patient's insurance requires PT and OT notes within 24 hours. Paged MD and requested orders for updated PT and for new OT, as no OT eval completed. CSW sent available clinicals to [facility] and will send updated PT/OT when available. CSW awaiting insurance auth. Will support with discharge when auth received.  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  Patient/Family's Understanding of and Emotional Response to Diagnosis, Current Treatment, and Prognosis: Patient and daughter with understanding of recommendation for SNF.  Emotional Assessment Appearance:  Appears stated age Attitude/Demeanor/Rapport:  Engaged Affect (typically observed):  Accepting, Anxious, Pleasant Orientation:  Oriented to Self, Oriented to Place, Oriented to  Time Alcohol / Substance use:  Not Applicable Psych involvement (Current and /or in the community):  No (Comment)  Discharge Needs  Concerns to be addressed:  Discharge Planning Concerns, Care Coordination Readmission within the last 30 days:  No Current discharge risk:  Physical Impairment Barriers to Discharge:  Continued Medical Work up, [place], [place] 08/29/2017, 11:27 AM

## 2017-08-29 NOTE — [place] FL2 (Signed)
[place] MEDICAID FL2 LEVEL OF CARE SCREENING TOOL     IDENTIFICATION  Patient Name: [name] Birthdate: October 29, 1929 Sex: female Admission Date (Current Location): 08/24/2017  [facility] and [place] Number:  Herbalist and Address:  [facility], [place] [address] [place], [place] [address]      Provider Number: [id number]  Attending Physician Name and Address:  Sanda Klein, MD  Relative Name and Phone Number:  Lenn Sink, Daughter, [phone]     Current Level of Care: Hospital Recommended Level of Care: [place] Prior Approval Number:    Date Approved/Denied:   PASRR Number: [id number]  Discharge Plan: SNF    Current Diagnoses: Patient Active Problem List   Diagnosis Date Noted  . NSTEMI (non-ST elevated myocardial infarction) ([place]) 08/25/2017  . Rheumatoid nodulosis ([place]) 06/09/2016  . Primary osteoarthritis of both hands 06/09/2016  . Primary osteoarthritis of both feet 06/09/2016  . History of lung cancer 06/09/2016  . High risk medication use 06/09/2016  . Aortic calcification ([place]) 05/10/2013  . Coronary artery calcification 05/10/2013  . Protein-calorie malnutrition, severe ([place]) 12/25/2012  . Depression 12/25/2012  . Anemia of chronic disease 12/24/2012  . Thrombocytopenia, unspecified 12/24/2012  . Aphthous stomatitis 12/24/2012  . Bleeding from nasopharynx 12/24/2012  . Cellulitis 12/23/2012  . Hemoptysis? Secondary to either irritation by dentures, vs. secondary to lung neoplasm 12/23/2012  . Rheumatoid arthritis-severe 12/23/2012  . COPD, mild ([place]) 12/23/2012  . Osteoporosis 12/23/2012  . Primary cancer of right upper lobe of lung ([place]) 03/21/2012    Orientation RESPIRATION BLADDER Height & Weight     Self, Time, Place  Normal Incontinent Weight: 93 lb 7.6 oz (42.4 kg) Height:  4\' 11"  (149.9 cm)  BEHAVIORAL SYMPTOMS/MOOD NEUROLOGICAL BOWEL NUTRITION STATUS      Continent Diet(please  see DC summary)  AMBULATORY STATUS COMMUNICATION OF NEEDS Skin   Extensive Assist Verbally Normal                       Personal Care Assistance Level of Assistance  Bathing, Feeding, Dressing Bathing Assistance: Limited assistance Feeding assistance: Independent Dressing Assistance: Limited assistance     Functional Limitations Info  Sight, Hearing, Speech Sight Info: Adequate Hearing Info: Adequate Speech Info: Adequate    SPECIAL CARE FACTORS FREQUENCY  PT (By licensed PT), OT (By licensed OT)     PT Frequency: 5x/week OT Frequency: 5x/week            Contractures Contractures Info: Not present    Additional Factors Info  Code Status, Allergies, Psychotropic Code Status Info: Full Allergies Info: Latex, Boniva Ibandronic Acid, Darvocet Propoxyphene N-acetaminophen, Erythromycin, Morphine And Related, Oxycontin Oxycodone Hcl, Sulfa Antibiotics, Tramadol, Vicodin Hydrocodone-acetaminophen Psychotropic Info: zoloft         Current Medications (08/29/2017):  This is the current hospital active medication list Current Facility-Administered Medications  Medication Dose Route Frequency Provider Last Rate Last Dose  . 0.9 %  sodium chloride infusion  250 mL Intravenous PRN Martinique, Peter M, MD      . 0.9 %  sodium chloride infusion  250 mL Intravenous PRN Martinique, Peter M, MD      . acetaminophen (TYLENOL) tablet 650 mg  650 mg Oral Q4H PRN Martinique, Peter M, MD      . ALPRAZolam Duanne Moron) tablet 0.25 mg  0.25 mg Oral TID PRN Martinique, Peter M, MD      . aspirin EC tablet 81 mg  81  mg Oral Daily Martinique, Peter M, MD   81 mg at 08/28/17 0900  . atorvastatin (LIPITOR) tablet 80 mg  80 mg Oral q1800 Martinique, Peter M, MD   80 mg at 08/28/17 1700  . carvedilol (COREG) tablet 3.125 mg  3.125 mg Oral BID WC Josue Hector, MD   3.125 mg at 08/28/17 1700  . heparin injection 5,000 Units  5,000 Units Subcutaneous Q8H Martinique, Peter M, MD   5,000 Units at 08/29/17 [phone]  . loperamide  (IMODIUM) capsule 2 mg  2 mg Oral PRN Barrett, Evelene Croon, PA-C   2 mg at 08/26/17 1735  . nitroGLYCERIN (NITROSTAT) SL tablet 0.4 mg  0.4 mg Sublingual Q5 Min x 3 PRN Martinique, Peter M, MD      . ondansetron [facility]) injection 4 mg  4 mg Intravenous Q6H PRN Martinique, Peter M, MD   4 mg at 08/28/17 2210  . sertraline (ZOLOFT) tablet 25 mg  25 mg Oral Daily Martinique, Peter M, MD   25 mg at 08/28/17 0902  . sodium chloride flush (NS) 0.9 % injection 3 mL  3 mL Intravenous Q12H Martinique, Peter M, MD   3 mL at 08/28/17 2209  . sodium chloride flush (NS) 0.9 % injection 3 mL  3 mL Intravenous PRN Martinique, Peter M, MD      . sodium chloride flush (NS) 0.9 % injection 3 mL  3 mL Intravenous Q12H Martinique, Peter M, MD   3 mL at 08/28/17 2209  . sodium chloride flush (NS) 0.9 % injection 3 mL  3 mL Intravenous PRN Martinique, Peter M, MD      . traMADol Veatrice Bourbon) tablet 50 mg  50 mg Oral Q6H PRN Martinique, Peter M, MD      . zolpidem [facility]) tablet 5 mg  5 mg Oral QHS PRN Martinique, Peter M, MD         Discharge Medications: Please see discharge summary for a list of discharge medications.  Relevant Imaging Results:  Relevant Lab Results:   Additional Information SSN: [id number]  Estanislado Emms, LCSW

## 2017-08-29 NOTE — Progress Notes (Signed)
Progress Note  Patient Name: [name] Date of Encounter: 08/29/2017  Primary Cardiologist: No primary care provider on file.   Subjective   Waiting placement mild cough   Inpatient Medications    Scheduled Meds: . aspirin EC  81 mg Oral Daily  . atorvastatin  80 mg Oral q1800  . carvedilol  3.125 mg Oral BID WC  . heparin  5,000 Units Subcutaneous Q8H  . sertraline  25 mg Oral Daily  . sodium chloride flush  3 mL Intravenous Q12H  . sodium chloride flush  3 mL Intravenous Q12H   Continuous Infusions: . sodium chloride    . sodium chloride     PRN Meds: sodium chloride, sodium chloride, acetaminophen, ALPRAZolam, loperamide, nitroGLYCERIN, ondansetron (ZOFRAN) IV, sodium chloride flush, sodium chloride flush, traMADol, zolpidem   Vital Signs    Vitals:   08/28/17 2020 08/28/17 2210 08/29/17 0000 08/29/17 0440  BP: (!) 136/53   (!) 148/45  Pulse: (!) 55   77  Resp: 15 (!) 24 (!) 21 (!) 28  Temp: 98.6 F (37 C)   98.4 F (36.9 C)  TempSrc: Oral   Oral  SpO2: 96%   98%  Weight:    93 lb 7.6 oz (42.4 kg)  Height:       No intake or output data in the 24 hours ending 08/29/17 0803 Filed Weights   08/27/17 0500 08/28/17 0500 08/29/17 0440  Weight: 93 lb 7.6 oz (42.4 kg) 92 lb 9.5 oz (42 kg) 93 lb 7.6 oz (42.4 kg)    Telemetry    NSR, mild sinus bradycardia - Personally Reviewed  ECG    Normal sinus rhythm, posterior fascicular block, nonspecific ST segment elevation across the anterior and inferior leads, moderately prolonged QTC 492 ms - Personally Reviewed  Physical Exam  Appears very old and frail, GEN: No acute distress.   Neck: No JVD Cardiac: RRR, no murmurs, rubs, or gallops.  Respiratory: Clear to auscultation bilaterally. GI: Soft, nontender, non-distended  MS:  1-2+ left ankle edema, soft; ulnar deviation and finger deformities consistent with arthritis Neuro:  Nonfocal  Psych: Depressed mood Right arm with echymosis and bruising up to  elbow hand wam and well perfused  Labs    Chemistry Recent Labs  Lab 08/25/17 0002 08/26/17 0038  NA 136 137  K 4.9 3.7  CL 100* 99*  CO2 26 30  GLUCOSE 105* 90  BUN 30* 18  CREATININE 1.33* 1.27*  CALCIUM 8.7* 8.2*  PROT  --  5.3*  ALBUMIN  --  2.8*  AST  --  20  ALT  --  11*  ALKPHOS  --  39  BILITOT  --  0.9  GFRNONAA 35* 37*  GFRAA 41* 43*  ANIONGAP 10 8     Hematology Recent Labs  Lab 08/27/17 0514 08/28/17 0520 08/29/17 0444  WBC 9.8 9.4 11.1*  RBC 4.20 3.13* 3.20*  HGB 13.5 9.8* 10.5*  HCT 42.1 31.4* 32.2*  MCV 100.2* 100.3* 100.6*  MCH 32.1 31.3 32.8  MCHC 32.1 31.2 32.6  RDW 14.5 14.4 14.3  PLT 177 194 218    Cardiac Enzymes Recent Labs  Lab 08/25/17 0127 08/25/17 1306  TROPONINI 1.01* 1.16*    Recent Labs  Lab 08/25/17 0011  TROPIPOC 0.33*     BNP Recent Labs  Lab 08/24/17 2347  BNP 211.0*     DDimer No results for input(s): DDIMER in the last 168 hours.   Radiology    No  results found.  Cardiac Studies   n/a  Patient Profile     84 y.o. cachectic female with long-standing rheumatoid arthritis, lung cancer in remission after XRT, COPD, presenting with non-STEMI, now with worsening ECG changes.  Atherosclerotic changes in the thoracic aorta and coronaries by CT as well as femoral arteries on previous US.  Assessment & Plan    1.  NSTEMI: ? takatsubo no CAD at cath and echo with normal EF no RWMA;s no need for DAT low dose beta blocker   2.  Depression: significant sertraline restarted  3.  Aortic atherosclerosis without aneurysm.  4.  RA: Disabling, probably on chronic steroids  5.  COPD and Rt Lung CA in remission after XRT 2017 desats with ambulation improved  6. CKD stage 3 stable Cr 1.27  SNF / consult placed patient expressed interest in [facility] to hospitalist service if possible    For questions or updates, please contact [place] Please consult [email or web address] for contact info  under Cardiology/STEMI.      Signed, Jenkins Rouge, MD  08/29/2017, 8:03 AM

## 2017-08-30 ENCOUNTER — Non-Acute Institutional Stay (SKILLED_NURSING_FACILITY): Payer: Medicare Other | Admitting: Internal Medicine

## 2017-08-30 ENCOUNTER — Encounter: Payer: Self-pay | Admitting: Internal Medicine

## 2017-08-30 DIAGNOSIS — M0609 Rheumatoid arthritis without rheumatoid factor, multiple sites: Secondary | ICD-10-CM

## 2017-08-30 DIAGNOSIS — R110 Nausea: Secondary | ICD-10-CM

## 2017-08-30 DIAGNOSIS — I214 Non-ST elevation (NSTEMI) myocardial infarction: Secondary | ICD-10-CM

## 2017-08-30 DIAGNOSIS — E43 Unspecified severe protein-calorie malnutrition: Secondary | ICD-10-CM | POA: Diagnosis not present

## 2017-08-30 DIAGNOSIS — D638 Anemia in other chronic diseases classified elsewhere: Secondary | ICD-10-CM | POA: Diagnosis not present

## 2017-08-30 DIAGNOSIS — R419 Unspecified symptoms and signs involving cognitive functions and awareness: Secondary | ICD-10-CM | POA: Diagnosis not present

## 2017-08-30 MED FILL — Heparin Sod (Porcine)-NaCl IV Soln 1000 Unit/500ML-0.9%: INTRAVENOUS | Qty: 1000 | Status: AC

## 2017-08-30 NOTE — Patient Instructions (Signed)
See assessment and plan under each diagnosis in the problem list and acutely for this visit 

## 2017-08-30 NOTE — Assessment & Plan Note (Signed)
08/30/17 patient denies chest pain at this time

## 2017-08-30 NOTE — Assessment & Plan Note (Addendum)
Iron panel and B12 assessments will be updated if not in Dr. Raul Del records

## 2017-08-30 NOTE — Assessment & Plan Note (Addendum)
Encouraged her to wear her upper plate to improve meal intake Nutrition consult

## 2017-08-30 NOTE — Assessment & Plan Note (Signed)
Continue tramadol

## 2017-08-30 NOTE — Progress Notes (Signed)
NURSING HOME LOCATION:  [facility] ROOM NUMBER:  [id number]  CODE STATUS:  DNR  PCP:  Mayra Neer, MD  [address] [place] [address]  This is a comprehensive admission note to [facility] performed on this date less than 30 days from date of admission. Included are preadmission medical/surgical history;reconciled medication list; family history; social history and comprehensive review of systems.  Corrections and additions to the records were documented. Comprehensive physical exam was also performed. Additionally a clinical summary was entered for each active diagnosis pertinent to this admission in the Problem List to enhance continuity of care.  HPI: Patient was hospitalized 5/8-5/13/19 with non-STEMI. Troponin peaked at 1.16. There was discordance between the EKG changes and the brevity of chest discomfort and the minor increase in cardiac enzymes. CT angiogram revealed ectatic aorta and pulmonary nodules. Cardiac catheterization 5/10 revealed a 30% stenosed mid LAD lesion. Grade 1 diastolic dysfunction was present. Aortic valve sclerosis was noted. Low-dose sertraline was prescribed for depression. Stool guaiac was positive ;hemoglobin initially dropped from 11.4 to 9.8 but then remained stable. In the absence of significant CAD, aspirin was not initiated. Workup for positive guaiac was deferred to her PCP by the Hospitalists. SNF placement was recommended for deconditioning. Labs 08/29/17 revealed mild leukocytosis with white count 11,100 and hemoglobin 10.5 and hematocrit 32.2.  Macrocytic indices were present with an MCV of 100.6. Last B12 on record was 845 on 12/24/12; iron was normal at 85 that date. On 5/10 creatinine was 1.27 BUN 2.8, total protein 5.3, and GFR 37.  Past medical and surgical history: Includes arthritis, depression, COPD, lung cancer in remission, CKD stage III, osteoporosis and dyslipidemia  Social history: Quit smoking 2011,  nondrinker. Family history: Cancer in mother and daughter.   Review of systems:  She scored 19 out of 30 on the [employer] mental status testing. She was unaware she came to the facility yesterday. Staff thought she felt tramadol was causing her nausea according to her statements . She denies that today. She states that tramadol helps her musculo skeletal pain. Her major complaint today is pain in the right neck. She has some nausea as well but no other active GI symptoms. She specifically denies chest pain or shortness of breath. She denies any active bleeding dyscrasias. She attributes  bruising to multiple blood draws. She made the comment that she thought she would have an anxiety attack.  Constitutional: No fever,significant weight change, fatigue  Eyes: No redness, discharge, pain, vision change ENT/mouth: No nasal congestion, purulent discharge, earache, change in hearing, sore throat  Cardiovascular: No chest pain, palpitations, paroxysmal nocturnal dyspnea, claudication, edema  Respiratory: No cough, sputum production, hemoptysis, DOE, significant snoring, apnea Gastrointestinal: No heartburn, dysphagia, abdominal pain, vomiting, rectal bleeding, melena, change in bowels Genitourinary: No dysuria, hematuria, pyuria, incontinence, nocturia Dermatologic: No rash, pruritus, change in appearance of skin Neurologic: No dizziness, headache, syncope, seizures, numbness, tingling Endocrine: No change in hair/skin/ nails, excessive thirst, excessive hunger, excessive urination  Hematologic/lymphatic: No significant  lymphadenopathy, abnormal bleeding Allergy/immunology: No itchy/watery eyes, significant sneezing, urticaria, angioedema  Physical exam:  Pertinent or positive findings: She is markedly hard of hearing. Her hair is disheveled and appears "electrified". She is cachectic with atrophy of limbs. She does not wear upper plate ;she has coated lower teeth. She speaks in a whispered, husky voice.  She has minor scattered rales. She has inspiratory pops and wheezes over the right chest posterior and anteriorly. Heart rhythm  and rate are regular. Pedal pulses are decreased. She has severe rheumatoid changes in the hands, greater on the right than the left. She also has deformities of the toes. Clubbing is suggested. Fusiform changes of the left knee are noted. She indicates this is tender. I cannot appreciate any definite effusion. She has extensive bruising over the right forearm and to a lesser degree over the left. Generalized weakness to opposition.  General appearance:  no acute distress, increased work of breathing is present.   Lymphatic: No lymphadenopathy about the head, neck, axilla. Eyes: No conjunctival inflammation or lid edema is present. There is no scleral icterus. Ears:  External ear exam shows no significant lesions or deformities.   Nose:  External nasal examination shows no deformity or inflammation. Nasal mucosa are pink and moist without lesions, exudates Oral exam: Lips and gums are healthy appearing.There is no oropharyngeal erythema or exudate. Neck:  No thyromegaly, masses, tenderness noted.    Heart:  Normal rate and regular rhythm. S1 and S2 normal without gallop, murmur, click, rub .  Abdomen: Bowel sounds are normal.  Abdomen is soft and nontender with no organomegaly, hernias, masses. GU: Deferred  Extremities:  No cyanosis, clubbing, edema  Neurologic exam: Balance, Rhomberg, finger to nose testing could not be completed due to clinical state Deep tendon reflexes are equal Skin: Warm & dry w/o tenting. No significant  rash.  See clinical summary under each active problem in the Problem List with associated updated therapeutic plan

## 2017-08-30 NOTE — Assessment & Plan Note (Signed)
Reassessment by PCP after rehabilitation at the SNF

## 2017-09-01 ENCOUNTER — Non-Acute Institutional Stay (SKILLED_NURSING_FACILITY): Payer: Medicare Other | Admitting: Internal Medicine

## 2017-09-01 ENCOUNTER — Encounter: Payer: Self-pay | Admitting: Internal Medicine

## 2017-09-01 DIAGNOSIS — R0902 Hypoxemia: Secondary | ICD-10-CM

## 2017-09-01 DIAGNOSIS — R9389 Abnormal findings on diagnostic imaging of other specified body structures: Secondary | ICD-10-CM

## 2017-09-01 DIAGNOSIS — F339 Major depressive disorder, recurrent, unspecified: Secondary | ICD-10-CM | POA: Diagnosis not present

## 2017-09-01 DIAGNOSIS — R627 Adult failure to thrive: Secondary | ICD-10-CM

## 2017-09-01 NOTE — Assessment & Plan Note (Addendum)
See plan under depression Nutrition consult See notes: Hospice consult may be indicated

## 2017-09-01 NOTE — Progress Notes (Signed)
NURSING HOME LOCATION:  [facility] ROOM NUMBER:  [id number]  CODE STATUS:  DNR  PCP:  Mayra Neer, MD  [address] [place] [address]  Room: 224 DNR status  This is a nursing facility follow up for specific acute issue of hypoxemia.  Interim medical record and care since last [place] visit was updated with review of diagnostic studies and change in clinical status since last visit were documented.  HPI: Staff reports O2 sats of 77% on FiO2 of 4 L. This is in the context of advanced COPD & post radiation for lung cancer. Most recent chest x-ray was personally reviewed. This showed advanced emphysema and irregular mass in the right upper lobe with interstitial changes superiorly and inferiorly to the irregular mass. Radiologist interpreted this as post radiation changes with no acute change. Severe osteoporosis was found along with atherosclerosis of the descending aorta and marked scoliosis of the cervical-thoracic spine. CT angiogram on the same date revealed new atelectasis with bronchiectatic changes in the right upper lobe. Stable left lower lobe mass 11.9 x 7.3 mm was  unchanged since 2014. The patient has been noted to be intermittently confused resulting in a variable history. Her daughter, Emmaline Kluver, was primary historian. She states that her mother was having difficulty using the MDI inhalers due to coordination problems as well as rheumatoid arthritis changes in her hands. Her daughter stated that oncology did not recommend additional radiation or other therapy based on the most recent imaging. Her daughter was somewhat unclear about her resuscitation status,but DO NOT RESUSCITATE was confirmed. Her daughter was anxious to have the sertraline increased, but it was only started 5/9. The patient is on tramadol, she states that she's only taking it once a day for pain. The potential for serotonin syndrome with high-dose sertraline and concomitant  tramadol was discussed.  Review of systems: Validity is in question as her history is variable. The patient's main issues are nausea and weakness. She's also had some anterior chest pain intermittently. It is nonradiating. She is unable to quantitate it or qualify it. According to daughter the patient is ready to progress to Hospice as 8 months ago she expressed a desire to simply "go to sleep". At this time the patient states she is just not hungry as explanation for not eating. She does describe intermittent hemoptysis less than a teaspoon @ a time.    Physical exam:  Pertinent or positive findings: She does use accessory muscles for respirations. This is most obvious observing the abdomen. Again hair is disheveled and uncombed. She is not wearing upper plate. Breath sounds are decreased. The previously noted respiratory pops and wheezes on the right side are not obvious today. She has a grade 1 systolic murmur. Abdomen is soft but protuberant. Posterior tibial pulses are decreased. When these were checked she states that palpation causes pain. Changes in the left knee.She has severe arthritic changes of rheumatoid character in the hands and feet.  Lymphatic: No lymphadenopathy about the head, neck, axilla. Eyes: No conjunctival inflammation or lid edema is present. There is no scleral icterus. Ears:  External ear exam shows no significant lesions or deformities.   Nose:  External nasal examination shows no deformity or inflammation. Nasal mucosa are pink and moist without lesions, exudates Oral exam:  Lips and gums are healthy appearing. There is no oropharyngeal erythema or exudate. Neck:  No thyromegaly, masses, tenderness noted.    Heart:  Normal rate and regular rhythm. S1  and S2 normal without gallop, click, rub .  Lungs:without wheezes, rhonchi,rales , rubs. Abdomen:Bowel sounds are normal. Abdomen is soft and nontender with no organomegaly, hernias,masses. GU: deferred  Extremities:  No  cyanosis, clubbing,edema  No significant  rash.  See summary under each active problem in the Problem List with associated updated therapeutic plan

## 2017-09-01 NOTE — Assessment & Plan Note (Addendum)
Consider increasing sertraline to 50 mg as of 5/23 Monitor for possible serotonin syndrome as she is on tramadol as needed. At this time she states she only takes it once a day which would not significantly increase the risk of serotonin syndrome.

## 2017-09-01 NOTE — Patient Instructions (Signed)
See assessment and plan under each diagnosis in the problem list and acutely for this visit Total time 42  minutes; greater than 50% of the visit spent counseling patient and her daughter coordinating care for problems addressed at this encounter

## 2017-09-01 NOTE — Assessment & Plan Note (Addendum)
Azithromycin, burst of steroids, scheduled pulmonary toilet Pulmonary follow-up Richardson Landry Minor PA with [facility] is family friend)

## 2017-09-05 ENCOUNTER — Non-Acute Institutional Stay: Payer: Self-pay | Admitting: Hospice and Palliative Medicine

## 2017-09-05 DIAGNOSIS — Z515 Encounter for palliative care: Secondary | ICD-10-CM

## 2017-09-05 NOTE — Progress Notes (Signed)
PALLIATIVE CARE CONSULT VISIT   PATIENT NAME: [name] DOB: [birth year] MRN: [id number]  PRIMARY CARE PROVIDER:   Mayra Neer, MD  REFERRING PROVIDER:    Dr. Linna Darner   RESPONSIBLE PARTY:   Self   RECOMMENDATIONS and PLAN:  1. Weakness - she is working with therapy and able to take some steps with assistance. At baseline, patient was living in a senior/low income apartment and was rather debilitated. She ambulated with a walker and used a bedside commode. Family questioned whether patient was previously able to bathe herself. 2. Dyspnea - improved on prednisone and oxygen. Staff say that patient tolerated staying off the O2 today for short period of time with SaO2 of 97% on RA.  3. Pain - stable on tramadol 4. Nutrition - family report that patient has had chronically poor oral intake over the past few months. Daughter says that she feels patient has given up. Patient has mostly eaten bites/sips over the past week. However, she ate 50% of her breakfast this AM. Recommend oral supplements TID.  5. Depression - SSRI recently adjusted. May benefit from LCSW to discuss emotional coping 6. GOC - I called and soke spoke with patient's daughter, she says that patient has declined over the past several months and she does not anticipate patient having a great outcome from this illness. She does not think that patient will return home and is looking for long term care placement. We talked about the need for Medicaid. We also discussed hospice involvement once patient switched to LTC. Daughter was very familiar with hospice and would be interested in this. She says that patient has frequently discussed hospice and just being ready to die. Will follow.   I spent 75 minutes providing this consultation,  from 1000 to 1115. More than 50% of the time in this consultation was spent coordinating communication.   HISTORY OF PRESENT ILLNESS:  [name] is a 84 y.o. year old female with multiple  medical problems including COPD, h/o lung cancer s/p XRT in 2017, HLD, RA, CKD III, depression, who was recently hospitalized 08/24/17 to 08/29/17 with NSTEMI. She underwent cardiac catheterization but no vessels were required intervention. She was noted to have diastolic dysfunction. Patient was also started on treatment for depression and was discharged to rehab due to deconditioning. Palliative Care has been asked to help address goals of care. Her rehab stay has been complicated by poor oral intake and hypoxic respiratory failure likely due to COPD exacerbation. However, today, staff tell me that patient is eating better and breathing seems more stable.   CODE STATUS: DNR  PPS: 30% HOSPICE ELIGIBILITY/DIAGNOSIS: YES  PAST MEDICAL HISTORY:  Past Medical History:  Diagnosis Date  . Cancer ([place])    lung ca  . COPD (chronic obstructive pulmonary disease) ([place])   . Hernia, umbilical   . Hip pain, right   . History of radiation therapy 05/02/12-05/04/12,&05/09/12   rul lung 54Gy/64fx  . HLD (hyperlipidemia)   . Lung mass   . Osteoporosis   . PAC (premature atrial contraction)   . RA (rheumatoid arthritis) ([place])   . Shingles     SOCIAL HX:  Social History   Tobacco Use  . Smoking status: Former Smoker    Types: Cigarettes    Last attempt to quit: 01/11/2010    Years since quitting: 7.6  . Smokeless tobacco: Never Used  Substance Use Topics  . Alcohol use: No    ALLERGIES:  Allergies  Allergen Reactions  .  Latex Other (See Comments)    Blisters on her mouth  . Boniva [Ibandronic Acid]     Muscle aches  . Darvocet [Propoxyphene N-Acetaminophen] Nausea And Vomiting  . Erythromycin Nausea And Vomiting  . Morphine And Related Nausea And Vomiting  . Oxycontin [Oxycodone Hcl] Nausea And Vomiting  . Sulfa Antibiotics Nausea And Vomiting  . Tramadol Nausea And Vomiting    08/30/17 patient denies intolerance to tramadol  . Vicodin [Hydrocodone-Acetaminophen]     dizzy     PERTINENT  MEDICATIONS:  Outpatient Encounter Medications as of 09/05/2017  Medication Sig  . carvedilol (COREG) 3.125 MG tablet Take 1 tablet (3.125 mg total) by mouth 2 (two) times daily with a meal.  . folic acid (FOLVITE) 1 MG tablet Take 1 tablet (1 mg total) by mouth daily.  . furosemide (LASIX) 20 MG tablet Take 1 tablet (20 mg total) by mouth as needed. For SOB or ankle edema  . NUTRITIONAL SUPPLEMENT LIQD Take 120 mLs by mouth 3 (three) times daily.  . ondansetron (ZOFRAN) 4 MG tablet Take 4 mg by mouth every 8 (eight) hours as needed for nausea or vomiting.  . predniSONE (DELTASONE) 5 MG tablet Take 1 tablet (5 mg total) by mouth daily with breakfast.  . ranitidine (ZANTAC) 150 MG tablet Take 150 mg by mouth 2 (two) times daily.  . sertraline (ZOLOFT) 25 MG tablet Take 1 tablet (25 mg total) by mouth daily.  . traMADol (ULTRAM) 50 MG tablet Take 50 mg by mouth every 6 (six) hours as needed for moderate pain.   No facility-administered encounter medications on file as of 09/05/2017.     PHYSICAL EXAM:   General: NAD, frail appearing, thin, lying in bed Cardiovascular: regular rate and rhythm Pulmonary: poor air movement, on O2 Abdomen: soft, nontender, + bowel sounds GU: no suprapubic tenderness Extremities: no edema, joint deformities in hands Skin: no rashes Neurological: Weakness but otherwise nonfocal  Irean Hong, NP

## 2017-09-08 ENCOUNTER — Telehealth: Payer: Self-pay | Admitting: Rheumatology

## 2017-09-08 NOTE — Telephone Encounter (Signed)
FYI: Patients daughter called to cancel patients appt in July. Patient has had a heart attack, and is working with Rehab. They will reschedule if, and when she is able to come back.

## 2017-09-20 ENCOUNTER — Non-Acute Institutional Stay (SKILLED_NURSING_FACILITY): Payer: Medicare Other | Admitting: Adult Health

## 2017-09-20 ENCOUNTER — Encounter: Payer: Self-pay | Admitting: Adult Health

## 2017-09-20 DIAGNOSIS — H6123 Impacted cerumen, bilateral: Secondary | ICD-10-CM

## 2017-09-20 DIAGNOSIS — D638 Anemia in other chronic diseases classified elsewhere: Secondary | ICD-10-CM

## 2017-09-20 DIAGNOSIS — I214 Non-ST elevation (NSTEMI) myocardial infarction: Secondary | ICD-10-CM

## 2017-09-20 DIAGNOSIS — M0609 Rheumatoid arthritis without rheumatoid factor, multiple sites: Secondary | ICD-10-CM

## 2017-09-20 DIAGNOSIS — M542 Cervicalgia: Secondary | ICD-10-CM

## 2017-09-20 DIAGNOSIS — F32A Depression, unspecified: Secondary | ICD-10-CM

## 2017-09-20 DIAGNOSIS — F329 Major depressive disorder, single episode, unspecified: Secondary | ICD-10-CM

## 2017-09-20 NOTE — Progress Notes (Signed)
Location:  [facility] Room Number: 580 Place of Service:  SNF (31) Provider:  Durenda Age, NP  Patient Care Team: Mayra Neer, MD as PCP - General (Family Medicine) Gaye Pollack, MD as Attending Physician (Cardiothoracic Surgery) Clarene Essex, MD as Attending Physician (Gastroenterology)  Extended Emergency Contact Information Primary Emergency Contact: Newt Lukes, [place] Phone: [phone] Work Phone: [phone] Mobile Phone: [phone] Relation: Daughter Secondary Emergency Contact: Kranz,[name] Address: [address]          [place], [place] [address] [place] of [place] Phone: [phone] Mobile Phone: [phone] Relation: Daughter  Code Status:  DNR  Goals of care: Advanced Directive information Advanced Directives 09/20/2017  Does Patient Have a Medical Advance Directive? Yes  Type of Advance Directive Out of facility DNR (pink MOST or yellow form)  Does patient want to make changes to medical advance directive? No - Patient declined  Copy of [place] in Chart? -  Would patient like information on creating a medical advance directive? No - Patient declined  Pre-existing out of facility DNR order (yellow form or pink MOST form) -     Chief Complaint  Patient presents with  . Medical Management of Chronic Issues    Patient is seen for a routine [facility] visit    HPI:  Pt is an 84 y.o. [name] seen today for medical management of chronic diseases.  She is a short-term rehabilitation resident at [facility].  She has a PMH of NSTEMI, lung cancer in remission, arthritis, depression, COPD, CKD stage III, osteoporosis, and dyslipidemia. She was seen in the room today and complained of right neck pain. She has chronic neck pain and follows up with Dr, Estanislado Pandy. Talked to daughter on the phone who requested prescription for   transport chair. Checked bilateral ears and noted to have dry impacted cerumen on both ears. No fever has been reported.    She has been admitted to [facility] on 08/29/17 from a recent hospitalization for NSTEMI. Troponin peaked at 1.16. CT angio of chest did not show PE. Cath showed mild nonobstructive CAD. Low dose BB was added. She has right lung cancer in remission after XRT 2017. She had +stool guaiac and will not need ASA.    Past Medical History:  Diagnosis Date  . Cancer ([place])    lung ca  . COPD (chronic obstructive pulmonary disease) ([place])   . Hernia, umbilical   . Hip pain, right   . History of radiation therapy 05/02/12-05/04/12,&05/09/12   rul lung 54Gy/49fx  . HLD (hyperlipidemia)   . Lung mass   . Osteoporosis   . PAC (premature atrial contraction)   . RA (rheumatoid arthritis) ([place])   . Shingles    Past Surgical History:  Procedure Laterality Date  . LEFT HEART CATH AND CORONARY ANGIOGRAPHY N/A 08/26/2017   Procedure: LEFT HEART CATH AND CORONARY ANGIOGRAPHY;  Surgeon: Martinique, Peter M, MD;  Location: [facility];  Service: Cardiovascular;  Laterality: N/A;  . right lobectomy      Allergies  Allergen Reactions  . Latex Other (See Comments)    Blisters on her mouth  . Boniva [Ibandronic Acid]     Muscle aches  . Darvocet [Propoxyphene N-Acetaminophen] Nausea And Vomiting  . Erythromycin Nausea And Vomiting  . Morphine And Related Nausea And Vomiting  . Oxycontin [Oxycodone Hcl] Nausea And  Vomiting  . Sulfa Antibiotics Nausea And Vomiting  . Tramadol Nausea And Vomiting    08/30/17 patient denies intolerance to tramadol  . Vicodin [Hydrocodone-Acetaminophen]     dizzy    Outpatient Encounter Medications as of 09/20/2017  Medication Sig  . carvedilol (COREG) 3.125 MG tablet Take 1 tablet (3.125 mg total) by mouth 2 (two) times daily with a meal.  . folic acid (FOLVITE) 1 MG tablet Take 1 tablet (1 mg total) by mouth daily.  .  furosemide (LASIX) 20 MG tablet Take 1 tablet (20 mg total) by mouth as needed. For SOB or ankle edema  . ipratropium-albuterol (DUONEB) 0.5-2.5 (3) MG/3ML SOLN Take 3 mLs by nebulization every 6 (six) hours as needed.  . Menthol, Topical Analgesic, (BIOFREEZE) 4 % GEL Apply 1 application topically 2 (two) times daily. Apply to right neck  . NUTRITIONAL SUPPLEMENT LIQD Take 120 mLs by mouth 3 (three) times daily. Med[employer]  . ondansetron (ZOFRAN) 4 MG tablet Take 4 mg by mouth every 8 (eight) hours as needed for nausea or vomiting.  . predniSONE (DELTASONE) 5 MG tablet Take 1 tablet (5 mg total) by mouth daily with breakfast.  . sertraline (ZOLOFT) 25 MG tablet Take 1 tablet (25 mg total) by mouth daily.  . traMADol (ULTRAM) 50 MG tablet Take 50 mg by mouth every 6 (six) hours as needed for moderate pain.  . [DISCONTINUED] ranitidine (ZANTAC) 150 MG tablet Take 150 mg by mouth 2 (two) times daily.   No facility-administered encounter medications on file as of 09/20/2017.     Review of Systems  GENERAL: No change in appetite, no fatigue, no weight changes, no fever, chills or weakness MOUTH and THROAT: Denies oral discomfort, gingival pain or bleeding RESPIRATORY: no cough, SOB, DOE, wheezing, hemoptysis CARDIAC: No chest pain, or palpitations GI: No abdominal pain, diarrhea, constipation, heart burn, nausea or vomiting PSYCHIATRIC: Denies feelings of depression or anxiety. No report of hallucinations, insomnia, paranoia, or agitation   Immunization History  Administered Date(s) Administered  . Influenza Whole 02/09/2012  . Influenza-Unspecified 01/17/2017  . Pneumococcal-Unspecified 04/19/2014   Pertinent  Health Maintenance Due  Topic Date Due  . PNA vac Low Risk Adult (1 of 2 - PCV13) 01/11/1997  . INFLUENZA VACCINE  11/17/2017  . DEXA SCAN  Discontinued   Fall Risk  01/12/2017 07/08/2016 03/25/2016 09/11/2015 06/26/2015  Falls in the past year? No No No No No  Risk for fall due to : - -  - - -      Vitals:   09/20/17 1120  BP: 122/63  Pulse: Emily  Resp: 17  Temp: 98.4 F (36.9 C)  TempSrc: Oral  SpO2: 97%  Weight: 91 lb 9.6 oz (41.5 kg)  Height: 4\' 11"  (1.499 m)   Body mass index is 18.5 kg/m.  Physical Exam  GENERAL APPEARANCE:  In no acute distress.  SKIN:  Skin is warm and dry.  EARS:  Has dry impacted cerumen on bilateral ears MOUTH and THROAT: Lips are without lesions. Oral mucosa is moist and without lesions. Tongue is normal in shape, size, and color and without lesions NECK: supple, trachea midline, no neck masses, no thyroid tenderness, no thyromegaly RESPIRATORY: Breathing is even & unlabored, BS CTAB CARDIAC: RRR, no murmur,no extra heart sounds,BLE 1+ edema GI: Abdomen soft, normal BS, no masses, no tenderness EXTREMITIES:  Able to move X 4 extremities PSYCHIATRIC: Alert and oriented X 3. Affect and behavior are appropriate   Labs reviewed: Recent Labs  07/12/17 1219 08/25/17 0002 08/26/17 0038  NA 140 136 137  K 4.3 4.9 3.7  CL 107 100* 99*  CO2 24 26 30   GLUCOSE 123 105* 90  BUN 23 30* 18  CREATININE 1.02 1.33* 1.27*  CALCIUM 9.1 8.7* 8.2*   Recent Labs    07/12/17 1219 08/26/17 0038  AST 15 20  ALT 11 11*  ALKPHOS 66 39  BILITOT 0.8 0.9  PROT 6.7 5.3*  ALBUMIN 3.4* 2.8*   Recent Labs    08/25/17 0002  08/27/17 0514 08/28/17 0520 08/29/17 0444  WBC 7.2   < > 9.8 9.4 11.1*  NEUTROABS 5.5  --   --   --   --   HGB 11.3*   < > 13.5 9.8* 10.5*  HCT 34.5*   < > 42.1 31.4* 32.2*  MCV 99.1   < > 100.2* 100.3* 100.6*  PLT 163   < > 177 194 218   < > = values in this interval not displayed.    Lab Results  Component Value Date   CHOL 168 08/26/2017   HDL 68 08/26/2017   LDLCALC 86 08/26/2017   TRIG 68 08/26/2017   CHOLHDL 2.5 08/26/2017    Significant Diagnostic Results in last 30 days:  Dg Chest 2 View  Result Date: 08/25/2017 CLINICAL DATA:  84 year old [name] with shortness of breath. History of right upper  lobe carcinoma status post radiation treatment. EXAM: CHEST - 2 VIEW COMPARISON:  Chest CT dated 07/12/2017. FINDINGS: Focal opacity in the right upper lobe similar to prior CT consistent with radiation treatment. No new consolidative changes. There is no pleural effusion or pneumothorax. The cardiac silhouette is within normal limits. There is atherosclerotic calcification of the aorta. Osteopenia with degenerative changes and scoliosis of the spine. No acute osseous pathology. IMPRESSION: 1. No acute cardiopulmonary process. 2. Post treatment changes of the right upper lobe. Electronically Signed   By: Anner Crete M.D.   On: 08/25/2017 01:05   Ct Angio Chest Pe W And/or Wo Contrast  Result Date: 08/25/2017 CLINICAL DATA:  Dyspnea since 20/2 100 hours. Left upper lobe lung cancer with radiation and lobectomy. COPD. EXAM: CT ANGIOGRAPHY CHEST WITH CONTRAST TECHNIQUE: Multidetector CT imaging of the chest was performed using the standard protocol during bolus administration of intravenous contrast. Multiplanar CT image reconstructions and MIPs were obtained to evaluate the vascular anatomy. CONTRAST:  80mL ISOVUE-370 IOPAMIDOL (ISOVUE-370) INJECTION 76% COMPARISON:  07/12/2017 and 10/24/2012 chest CT FINDINGS: Cardiovascular: Aortic and coronary arteriosclerosis. Borderline cardiomegaly without pericardial effusion. Ectatic thoracic aorta without aneurysm. Satisfactory opacification of the pulmonary arteries without pulmonary embolus. Mediastinum/Nodes: No mediastinal or hilar lymphadenopathy. Patent trachea and mainstem bronchi. Esophagus is unremarkable. Lungs/Pleura: Volume loss with new confluent area of atelectasis and bronchiectasis in the right upper lobe. Stable bibasilar scarring some which appears calcified in the left lower lobe. No pneumothorax. Stable nodularity in the medial right upper lobe adjacent to the major fissure measuring 7.2 x 5.9 mm, unchanged in appearance. Paraseptal adjacent  emphysema is seen. Redemonstration of ovoid masslike opacity in the left lower lobe unchanged in appearance measuring 11.9 x 7.3 cm on series 6/60. No effusion or pneumothorax. Upper Abdomen: Splenic granulomas. No space-occupying mass of the liver nor included adrenal glands. Musculoskeletal: No aggressive nor suspicious osseous lesions. Review of the MIP images confirms the above findings. IMPRESSION: 1. Cardiomegaly with coronary arteriosclerosis. Ectatic aorta without aneurysm. No acute pulmonary embolus. 2. New confluent area of atelectasis with bronchiectasis in the  right upper lobe. 3. Stable bibasilar scarring with ovoid pulmonary nodular opacity measuring 11.9 x 7.3 mm in the left lower lobe consistent with a benign finding given stability dating back to 2014. Aortic Atherosclerosis (ICD10-I70.0). Electronically Signed   By: Ashley Royalty M.D.   On: 08/25/2017 03:02    Assessment/Plan  1. NSTEMI (non-ST elevated myocardial infarction) (HCC) - stable, continue Carvedilol 3.125 mg BID   2. Neck pain - start Biofreeze 4% gel topically to neck Q AM and HS (may self medicate), Tramadol 50 mg Q 6 hours PRN, follow-up with Dr. Estanislado Pandy, rheumatology, will do x-ray of neck   3. Rheumatoid arthritis of multiple sites with negative rheumatoid factor (HCC) - continue Prednisone 5 mg daily   4. Bilateral impacted cerumen - will start Debrox otic gtts 6 gtts to both ears BID X 4 days and flush on 5th day with NS   5. Chronic depression - mood is stable, continue Sertraline 25 mg daily  6. Anemia of chronic disease - had + stool guaiac, will recheck hgb Lab Results  Component Value Date   HGB 10.5 (L) 08/29/2017      Family/ staff Communication: Discussed plan of care with resident.  Labs/tests ordered:  CBC, BMP  Goals of care:   Short-term care   Durenda Age, NP [facility] and Adult Medicine [phone] (Monday-Friday 8:00 a.m. - 5:00 p.m.) [phone] (after  hours)

## 2017-09-21 LAB — CBC AND DIFFERENTIAL
HCT: 27 — AB (ref 36–46)
Hemoglobin: 9.2 — AB (ref 12.0–16.0)
Neutrophils Absolute: 4
Platelets: 171 (ref 150–399)
WBC: 6.1

## 2017-09-21 LAB — BASIC METABOLIC PANEL
BUN: 25 — AB (ref 4–21)
Creatinine: 0.9 (ref 0.5–1.1)
Glucose: 88
Potassium: 4 (ref 3.4–5.3)
Sodium: 144 (ref 137–147)

## 2017-09-26 LAB — BASIC METABOLIC PANEL
BUN: 18 (ref 4–21)
Creatinine: 0.9 (ref 0.5–1.1)
Glucose: 91
Potassium: 3.9 (ref 3.4–5.3)
Sodium: 142 (ref 137–147)

## 2017-09-28 ENCOUNTER — Institutional Professional Consult (permissible substitution): Payer: Medicare Other | Admitting: Internal Medicine

## 2017-10-03 NOTE — Progress Notes (Signed)
Office Visit Note  Patient: [name]             Date of Birth: 11-Aug-1929           MRN: [id number]             PCP: Mayra Neer, MD Referring: Mayra Neer, MD Visit Date: 10/11/2017 Occupation: @GUAROCC @    Subjective:  Pain and swelling in both hands.   History of Present Illness: Ian Cavey is a 84 y.o. female history of seronegative rheumatoid arthritis, nodulosis and osteoarthritis.  She continues to have pain and swelling in her bilateral hands.  She was recently hospitalized with MI and recovered from that gradually.  Activities of Daily Living:  Patient reports morning stiffness for 30 minutes.   Patient Denies nocturnal pain.  Difficulty dressing/grooming: Reports Difficulty climbing stairs: Reports Difficulty getting out of chair: Reports Difficulty using hands for taps, buttons, cutlery, and/or writing: Reports   Review of Systems  Constitutional: Negative for fatigue.  HENT: Negative for mouth sores and mouth dryness.   Eyes: Negative for dryness.  Respiratory: Negative for shortness of breath and difficulty breathing.   Cardiovascular: Negative for chest pain and swelling in legs/feet.  Gastrointestinal: Negative for abdominal pain, constipation and diarrhea.  Endocrine: Negative for increased urination.  Genitourinary: Negative for pelvic pain.  Musculoskeletal: Positive for arthralgias, joint pain, joint swelling and morning stiffness.  Skin: Negative for rash.  Allergic/Immunologic: Negative for susceptible to infections.  Neurological: Negative for dizziness, light-headedness, numbness, headaches and weakness.  Hematological: Positive for bruising/bleeding tendency.  Psychiatric/Behavioral: Positive for sleep disturbance.    PMFS History:  Patient Active Problem List   Diagnosis Date Noted  . Abnormal CT of the chest 09/01/2017  . Adult failure to thrive 09/01/2017  . Neurocognitive disorder 08/30/2017  . NSTEMI (non-ST elevated  myocardial infarction) ([place]) 08/25/2017  . Rheumatoid nodulosis ([place]) 06/09/2016  . Primary osteoarthritis of both hands 06/09/2016  . Primary osteoarthritis of both feet 06/09/2016  . History of lung cancer 06/09/2016  . High risk medication use 06/09/2016  . Aortic calcification ([place]) 05/10/2013  . Coronary artery calcification 05/10/2013  . Protein-calorie malnutrition, severe ([place]) 12/25/2012  . Depression, recurrent ([place]) 12/25/2012  . Anemia of chronic disease 12/24/2012  . Thrombocytopenia, unspecified 12/24/2012  . Aphthous stomatitis 12/24/2012  . Bleeding from nasopharynx 12/24/2012  . Cellulitis 12/23/2012  . Hemoptysis? Secondary to either irritation by dentures, vs. secondary to lung neoplasm 12/23/2012  . Rheumatoid arthritis-severe 12/23/2012  . COPD, mild ([place]) 12/23/2012  . Osteoporosis 12/23/2012  . Primary cancer of right upper lobe of lung ([place]) 03/21/2012    Past Medical History:  Diagnosis Date  . Cancer ([place])    lung ca  . COPD (chronic obstructive pulmonary disease) ([place])   . Hernia, umbilical   . Hip pain, right   . History of radiation therapy 05/02/12-05/04/12,&05/09/12   rul lung 54Gy/95fx  . HLD (hyperlipidemia)   . Lung mass   . Osteoporosis   . PAC (premature atrial contraction)   . RA (rheumatoid arthritis) ([place])   . Shingles     Family History  Problem Relation Age of Onset  . Cancer Daughter   . Cancer Mother    Past Surgical History:  Procedure Laterality Date  . LEFT HEART CATH AND CORONARY ANGIOGRAPHY N/A 08/26/2017   Procedure: LEFT HEART CATH AND CORONARY ANGIOGRAPHY;  Surgeon: Martinique, Peter M, MD;  Location: [facility];  Service: Cardiovascular;  Laterality: N/A;  . right  lobectomy     Social History   Social History Narrative   Lives in ALF at [facility].  08/2017 resident of [facility].       Objective: Vital Signs: BP (!) 118/58 (BP Location: Left Arm, Patient Position: Sitting, Cuff Size:  Normal)   Pulse 62   Resp 14   Ht 4\' 11"  (1.499 m)   Wt 91 lb (41.3 kg)   BMI 18.38 kg/m    Physical Exam  Constitutional: She is oriented to person, place, and time. She appears well-developed and well-nourished.  HENT:  Head: Normocephalic and atraumatic.  Eyes: Conjunctivae and EOM are normal.  Neck: Normal range of motion.  Cardiovascular: Normal rate, regular rhythm, normal heart sounds and intact distal pulses.  Pulmonary/Chest: Effort normal and breath sounds normal.  Abdominal: Soft. Bowel sounds are normal.  Lymphadenopathy:    She has no cervical adenopathy.  Neurological: She is alert and oriented to person, place, and time.  Skin: Skin is warm and dry. Capillary refill takes less than 2 seconds.  Psychiatric: She has a normal mood and affect. Her behavior is normal.  Nursing note and vitals reviewed.    Musculoskeletal Exam: Limited range of motion of her cervical spine.  Shoulder joints elbow joints are good range of motion.  She has limited range of motion of her wrist joints.  She is in complete fist formation.  She is ulnar deviation overall MCP joints with synovial thickening.  Nodulosis was noted over her left fourth MCP.  Hip joints were difficult to assess in the sitting position.  She had effusion and warmth in her left knee joint.  Ankle joints were thickened.  CDAI Exam: CDAI Homunculus Exam:   Tenderness:  LLE: tibiofemoral  Swelling:  LLE: tibiofemoral  Joint Counts:  CDAI Tender Joint count: 1 CDAI Swollen Joint count: 1  Global Assessments:  Patient Global Assessment: 8 Provider Global Assessment: 8  CDAI Calculated Score: 18    Investigation: No additional findings. CBC Latest Ref Rng & Units 09/21/2017 08/29/2017 08/28/2017  WBC - 6.1 11.1(H) 9.4  Hemoglobin 12.0 - 16.0 9.2(A) 10.5(L) 9.8(L)  Hematocrit 36 - 46 27(A) 32.2(L) 31.4(L)  Platelets 150 - 399 171 218 194   CMP Latest Ref Rng & Units 09/26/2017 09/21/2017 08/26/2017  Glucose 65 -  99 mg/dL - - 90  BUN 4 - 21 18 25(A) 18  Creatinine 0.5 - 1.1 0.9 0.9 1.27(H)  Sodium 137 - 147 142 144 137  Potassium 3.4 - 5.3 3.9 4.0 3.7  Chloride 101 - 111 mmol/L - - 99(L)  CO2 22 - 32 mmol/L - - 30  Calcium 8.9 - 10.3 mg/dL - - 8.2(L)  Total Protein 6.5 - 8.1 g/dL - - 5.3(L)  Total Bilirubin 0.3 - 1.2 mg/dL - - 0.9  Alkaline Phos 38 - 126 U/L - - 39  AST 15 - 41 U/L - - 20  ALT 14 - 54 U/L - - 11(L)     Imaging: No results found.  Speciality Comments: No specialty comments available.    Procedures:  Large Joint Inj: L knee on 10/11/2017 4:18 PM Indications: pain Details: 27 G 1.5 in needle, medial approach  Arthrogram: No  Medications: 1.5 mL lidocaine (PF) 1 %; 40 mg triamcinolone acetonide 40 MG/ML Aspirate: 0 mL Outcome: tolerated well, no immediate complications Procedure, treatment alternatives, risks and benefits explained, specific risks discussed. Consent was given by the patient. Immediately prior to procedure a time out was  called to verify the correct patient, procedure, equipment, support staff and site/side marked as required. Patient was prepped and draped in the usual sterile fashion.     Allergies: Latex; Boniva [ibandronic acid]; Darvocet [propoxyphene n-acetaminophen]; Erythromycin; Morphine and related; Oxycontin [oxycodone hcl]; Sulfa antibiotics; Tramadol; and Vicodin [hydrocodone-acetaminophen]   Assessment / Plan:     Visit Diagnoses: Rheumatoid arthritis of multiple sites with negative rheumatoid factor (HCC) - Severe disease with nodulosisPrednisone 5 mg daily.  She has severe arthritis with subluxation of all MCP joints.  She has some synovial thickening noted with synovitis in her hands today.  Patient was treated with methotrexate in the past which she stopped due to side effects.  We had detailed discussion regarding possible use of Plaquenil or sulfasalazine.  Patient refused the medication as she does not want frequent blood work for  monitoring the medication.  She wants to continue on prednisone.  I refilled her prednisone prescription today.  Rheumatoid nodulosis (HCC)-stable  High risk medication use - Prednisone 5 mg p.o. daily she does not want to take SSZ or PLQ.   Primary osteoarthritis and rheumatoid arthritis of both hands-she has decreased and limited range of motion of her hands with decreased fist formation.  Chronic pain of left knee-she had warmth and swelling in her left knee joint.  She requested a cortisone injection.  Side effects and contraindications were discussed.  Left knee joint was injected as described above.  Primary osteoarthritis of both feet-she continues to have discomfort.  Age-related osteoporosis without current pathological fracture  History of lung cancer  History of COPD    Orders: Orders Placed This Encounter  Procedures  . Large Joint Inj   Meds ordered this encounter  Medications  . predniSONE (DELTASONE) 5 MG tablet    Sig: Take 1 tablet (5 mg total) by mouth daily with breakfast.    Dispense:  30 tablet    Refill:  3    Face-to-face time spent with patient was 30 minutes. >50% of time was spent in counseling and coordination of care.  Follow-Up Instructions: Return in about 6 months (around 04/12/2018) for Rheumatoid arthritis, Osteoarthritis.   Bo Merino, MD  Note - This record has been created using Editor, commissioning.  Chart creation errors have been sought, but may not always  have been located. Such creation errors do not reflect on  the standard of medical care.

## 2017-10-07 ENCOUNTER — Ambulatory Visit: Payer: Medicare Other | Admitting: Physician Assistant

## 2017-10-10 ENCOUNTER — Telehealth: Payer: Self-pay | Admitting: Rheumatology

## 2017-10-10 ENCOUNTER — Ambulatory Visit: Payer: Medicare Other | Admitting: Pulmonary Disease

## 2017-10-10 ENCOUNTER — Encounter: Payer: Self-pay | Admitting: Pulmonary Disease

## 2017-10-10 ENCOUNTER — Telehealth (INDEPENDENT_AMBULATORY_CARE_PROVIDER_SITE_OTHER): Payer: Self-pay | Admitting: Radiology

## 2017-10-10 VITALS — BP 136/74 | HR 64 | Ht 59.0 in | Wt 94.8 lb

## 2017-10-10 DIAGNOSIS — J438 Other emphysema: Secondary | ICD-10-CM | POA: Diagnosis not present

## 2017-10-10 MED ORDER — IPRATROPIUM BROMIDE 0.02 % IN SOLN: 0.5000 mg | RESPIRATORY_TRACT | 12 refills | Status: DC | PRN

## 2017-10-10 NOTE — Telephone Encounter (Signed)
Patient's daughter [name] called requesting billing and payment information from her appointments with Dr. Landis Gandy from June 2018 to present.  Patient was given the billing phone number to request the information, but she was only able to leave a message with no response.  Patient is being moved from the nursing facility back to [facility].  Patient states [facility] is requesting this information asap.  Patient asked to speak with an office manager due to her frustration about our billing center.  Patient was transferred to [facility] due to [employer] being out of the office.

## 2017-10-10 NOTE — Progress Notes (Signed)
[name]    [id number]    1930/01/22  Primary Care Physician:Shaw, [name], MD  Referring Physician: Mayra Neer, MD 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  Follows with Dr. Estanislado Pandy for rheumatoid arthritis.  Previously treated with methotrexate but is currently on chronic prednisone. She has a history of COPD but no PFTs on record.  Hospitalized in May 19 for non-ST elevation MI, she underwent a cardiac cath which did not show significant obstructive coronary artery disease.  She is currently at a nursing home and is due to move to her own house this week.  His rehab stay has been complicated with acute on chronic respiratory failure thought to be secondary to COPD. Denies dyspnea on exertion, cough, sputum production, wheezing.    Outpatient Encounter Medications as of 10/10/2017  Medication Sig  . carvedilol (COREG) 3.125 MG tablet Take 1 tablet (3.125 mg total) by mouth 2 (two) times daily with a meal.  . folic acid (FOLVITE) 1 MG tablet Take 1 tablet (1 mg total) by mouth daily.  . furosemide (LASIX) 20 MG tablet Take 1 tablet (20 mg total) by mouth as needed. For SOB or ankle edema  . Menthol, Topical Analgesic, (BIOFREEZE) 4 % GEL Apply 1 application topically 2 (two) times daily. Apply to right neck  . NUTRITIONAL SUPPLEMENT LIQD Take 120 mLs by mouth 3 (three) times daily. MedPass  . ondansetron (ZOFRAN) 4 MG tablet Take 4 mg by mouth every 8 (eight) hours as needed for nausea or vomiting.  . predniSONE (DELTASONE) 5 MG tablet Take 1 tablet (5 mg total) by mouth daily with breakfast.  . sertraline (ZOLOFT) 25 MG tablet Take 1 tablet (25 mg total) by mouth daily.  . traMADol (ULTRAM) 50 MG tablet Take 50 mg by mouth every 6 (six) hours as needed for  moderate pain.  Marland Kitchen ipratropium-albuterol (DUONEB) 0.5-2.5 (3) MG/3ML SOLN Take 3 mLs by nebulization every 6 (six) hours as needed.   No facility-administered encounter medications on file as of 10/10/2017.     Allergies as of 10/10/2017 - Review Complete 10/10/2017  Allergen Reaction Noted  . Latex Other (See Comments) 03/21/2012  . Boniva [ibandronic acid]  03/21/2012  . Darvocet [propoxyphene n-acetaminophen] Nausea And Vomiting 03/21/2012  . Erythromycin Nausea And Vomiting 03/21/2012  . Morphine and related Nausea And Vomiting 03/21/2012  . Oxycontin [oxycodone hcl] Nausea And Vomiting 03/21/2012  . Sulfa antibiotics Nausea And Vomiting 03/21/2012  . Tramadol Nausea And Vomiting 12/23/2012  . Vicodin [hydrocodone-acetaminophen]  03/21/2012    Past Medical History:  Diagnosis Date  . Cancer ([place])    lung ca  . COPD (chronic obstructive pulmonary disease) ([place])   . Hernia, umbilical   . Hip pain, right   . History of radiation therapy 05/02/12-05/04/12,&05/09/12   rul lung 54Gy/79fx  . HLD (hyperlipidemia)   . Lung mass   . Osteoporosis   . PAC (premature atrial contraction)   . RA (rheumatoid arthritis) ([place])   . Shingles     Past Surgical History:  Procedure Laterality Date  . LEFT HEART CATH AND CORONARY ANGIOGRAPHY N/A 08/26/2017   Procedure: LEFT HEART CATH AND CORONARY ANGIOGRAPHY;  Surgeon: Martinique, Peter M, MD;  Location: [facility];  Service: Cardiovascular;  Laterality: N/A;  .  right lobectomy      Family History  Problem Relation Age of Onset  . Cancer Daughter   . Cancer Mother     Social History   Socioeconomic History  . Marital status: Widowed    Spouse name: Not on file  . Number of children: Not on file  . Years of education: Not on file  . Highest education level: Not on file  Occupational History  . Occupation: retired    Fish farm manager: RETIRED  Social Needs  . Financial resource strain: Not on file  . Food insecurity:    Worry: Not on  file    Inability: Not on file  . Transportation needs:    Medical: Not on file    Non-medical: Not on file  Tobacco Use  . Smoking status: Former Smoker    Types: Cigarettes    Last attempt to quit: 1995    Years since quitting: 24.4  . Smokeless tobacco: Never Used  Substance and Sexual Activity  . Alcohol use: No  . Drug use: No  . Sexual activity: Not on file  Lifestyle  . Physical activity:    Days per week: Not on file    Minutes per session: Not on file  . Stress: Not on file  Relationships  . Social connections:    Talks on phone: Not on file    Gets together: Not on file    Attends religious service: Not on file    Active member of club or organization: Not on file    Attends meetings of clubs or organizations: Not on file    Relationship status: Not on file  . Intimate partner violence:    Fear of current or ex partner: Not on file    Emotionally abused: Not on file    Physically abused: Not on file    Forced sexual activity: Not on file  Other Topics Concern  . Not on file  Social History Narrative   Lives in ALF at [facility].  08/2017 resident of [facility].      Review of systems: Review of Systems  Constitutional: Negative for fever and chills.  HENT: Negative.   Eyes: Negative for blurred vision.  Respiratory: as per HPI  Cardiovascular: Negative for chest pain and palpitations.  Gastrointestinal: Negative for vomiting, diarrhea, blood per rectum. Genitourinary: Negative for dysuria, urgency, frequency and hematuria.  Musculoskeletal: Negative for myalgias, back pain and joint pain.  Skin: Negative for itching and rash.  Neurological: Negative for dizziness, tremors, focal weakness, seizures and loss of consciousness.  Endo/Heme/Allergies: Negative for environmental allergies.  Psychiatric/Behavioral: Negative for depression, suicidal ideas and hallucinations.  All other systems reviewed and are negative.  Physical  Exam: Blood pressure 136/74, pulse 64, height 4\' 11"  (1.499 m), weight 94 lb 12.8 oz (43 kg), SpO2 94 %. Gen:      No acute distress HEENT:  EOMI, sclera anicteric Neck:     No masses; no thyromegaly Lungs:    Clear to auscultation bilaterally; normal respiratory effort CV:         Regular rate and rhythm; no murmurs Abd:      + bowel sounds; soft, non-tender; no palpable masses, no distension Ext:    No edema; adequate peripheral perfusion Skin:      Warm and dry; no rash Neuro: alert and oriented x 3 Psych: normal mood and affect  Data Reviewed: Echo cardiogram 08/26/17 LVEF 65-70%, severe LVH, normal wall motion, grade 1 DD,  indeterminate LV filling pressure, aortic valve sclerosis with trivial AI, trivial MR, normal LA size, normal IVC.  CT Angio 08/25/2017- cardiomegaly, coronary atherosclerosis.  Atelectasis, bronchiectasis in the right upper lobe.  Bibasilar scarring with nodular opacity in the left lower lobe stable compared to 2014.  Assessment:  Evaluation for COPD Schedule pulmonary function test We will order nebulizers and duo nebs every 4 hours as needed for home.  She is due to go home from her nursing home in the next few days.  Based on review of PFTs we may add long-acting medications. I doubt she will do well with inhalers given lack of dexterity due to rheumatoid arthritis.  Lung cancer status post XRT Imaging reviewed which shows evolving consolidation, bronchiectasis in the right upper lobe which could be post radiation changes.  In addition there are some nodular opacities that have remained stable.  We will continue to follow this  Plan/Recommendations: - Pulmonary function test - Nebulizer, duo nebs every 4 hours.  Marshell Garfinkel MD [facility] 10/10/2017, 12:01 PM  CC: Mayra Neer, MD

## 2017-10-10 NOTE — Patient Instructions (Signed)
Will order nebulizer and duo nebs every 4 hours as needed for home We will check with pharmacy or DME to see which is the cheapest option for you We will schedule you for pulmonary function test Return to clinic in 1 to 2 months.

## 2017-10-10 NOTE — Telephone Encounter (Signed)
Shirleen Schirmer, daughter, called this morning requesting assistance obtaining a statement of charges for patient.  Copy at front for her to pickup tomorrow, also emailed a copy to her.

## 2017-10-11 ENCOUNTER — Encounter: Payer: Self-pay | Admitting: Physician Assistant

## 2017-10-11 ENCOUNTER — Ambulatory Visit (INDEPENDENT_AMBULATORY_CARE_PROVIDER_SITE_OTHER): Payer: Medicare Other | Admitting: Physician Assistant

## 2017-10-11 VITALS — BP 118/58 | HR 62 | Resp 14 | Ht 59.0 in | Wt 91.0 lb

## 2017-10-11 DIAGNOSIS — M0609 Rheumatoid arthritis without rheumatoid factor, multiple sites: Secondary | ICD-10-CM

## 2017-10-11 DIAGNOSIS — Z8709 Personal history of other diseases of the respiratory system: Secondary | ICD-10-CM

## 2017-10-11 DIAGNOSIS — M25562 Pain in left knee: Secondary | ICD-10-CM | POA: Diagnosis not present

## 2017-10-11 DIAGNOSIS — M19041 Primary osteoarthritis, right hand: Secondary | ICD-10-CM | POA: Diagnosis not present

## 2017-10-11 DIAGNOSIS — G8929 Other chronic pain: Secondary | ICD-10-CM

## 2017-10-11 DIAGNOSIS — Z79899 Other long term (current) drug therapy: Secondary | ICD-10-CM

## 2017-10-11 DIAGNOSIS — M19042 Primary osteoarthritis, left hand: Secondary | ICD-10-CM

## 2017-10-11 DIAGNOSIS — M19071 Primary osteoarthritis, right ankle and foot: Secondary | ICD-10-CM

## 2017-10-11 DIAGNOSIS — M810 Age-related osteoporosis without current pathological fracture: Secondary | ICD-10-CM | POA: Diagnosis not present

## 2017-10-11 DIAGNOSIS — M0630 Rheumatoid nodule, unspecified site: Secondary | ICD-10-CM | POA: Diagnosis not present

## 2017-10-11 DIAGNOSIS — M19072 Primary osteoarthritis, left ankle and foot: Secondary | ICD-10-CM

## 2017-10-11 DIAGNOSIS — Z85118 Personal history of other malignant neoplasm of bronchus and lung: Secondary | ICD-10-CM

## 2017-10-11 MED ORDER — PREDNISONE 5 MG PO TABS: 5.0000 mg | ORAL_TABLET | Freq: Every day | ORAL | 3 refills | Status: DC

## 2017-10-11 MED ORDER — LIDOCAINE HCL (PF) 1 % IJ SOLN
1.5000 mL | INTRAMUSCULAR | Status: AC | PRN
  Administered 2017-10-11: 1.5 mL

## 2017-10-11 MED ORDER — TRIAMCINOLONE ACETONIDE 40 MG/ML IJ SUSP
40.0000 mg | INTRAMUSCULAR | Status: AC | PRN
  Administered 2017-10-11: 40 mg via INTRA_ARTICULAR

## 2017-10-14 ENCOUNTER — Non-Acute Institutional Stay (SKILLED_NURSING_FACILITY): Payer: Medicare Other | Admitting: Adult Health

## 2017-10-14 ENCOUNTER — Encounter: Payer: Self-pay | Admitting: Adult Health

## 2017-10-14 DIAGNOSIS — F339 Major depressive disorder, recurrent, unspecified: Secondary | ICD-10-CM

## 2017-10-14 DIAGNOSIS — J449 Chronic obstructive pulmonary disease, unspecified: Secondary | ICD-10-CM

## 2017-10-14 DIAGNOSIS — D638 Anemia in other chronic diseases classified elsewhere: Secondary | ICD-10-CM

## 2017-10-14 DIAGNOSIS — M0579 Rheumatoid arthritis with rheumatoid factor of multiple sites without organ or systems involvement: Secondary | ICD-10-CM | POA: Diagnosis not present

## 2017-10-14 DIAGNOSIS — I214 Non-ST elevation (NSTEMI) myocardial infarction: Secondary | ICD-10-CM

## 2017-10-14 DIAGNOSIS — R600 Localized edema: Secondary | ICD-10-CM

## 2017-10-14 MED ORDER — IPRATROPIUM-ALBUTEROL 0.5-2.5 (3) MG/3ML IN SOLN: 3.0000 mL | Freq: Four times a day (QID) | RESPIRATORY_TRACT | 0 refills | Status: DC | PRN

## 2017-10-14 MED ORDER — PREDNISONE 5 MG PO TABS: 5.0000 mg | ORAL_TABLET | Freq: Every day | ORAL | 0 refills | Status: DC

## 2017-10-14 MED ORDER — MENTHOL (TOPICAL ANALGESIC) 4 % EX GEL: 1.0000 "application " | Freq: Two times a day (BID) | CUTANEOUS | 0 refills | Status: DC

## 2017-10-14 MED ORDER — FUROSEMIDE 20 MG PO TABS: 20.0000 mg | ORAL_TABLET | Freq: Every day | ORAL | 0 refills | Status: DC | PRN

## 2017-10-14 MED ORDER — SERTRALINE HCL 25 MG PO TABS: 25.0000 mg | ORAL_TABLET | Freq: Every day | ORAL | 0 refills | Status: DC

## 2017-10-14 MED ORDER — CARVEDILOL 3.125 MG PO TABS: 3.1250 mg | ORAL_TABLET | Freq: Two times a day (BID) | ORAL | 0 refills | Status: DC

## 2017-10-14 NOTE — Progress Notes (Signed)
Location:  [facility] Room Number: 313-A Place of Service:  SNF (31) Provider:  Durenda Age, NP  Patient Care Team: Mayra Neer, MD as PCP - General (Family Medicine) Gaye Pollack, MD as Attending Physician (Cardiothoracic Surgery) Clarene Essex, MD as Attending Physician (Gastroenterology)  Extended Emergency Contact Information Primary Emergency Contact: Newt Lukes, [place] of [place] Phone: [phone] Work Phone: [phone] Mobile Phone: [phone] Relation: Daughter Secondary Emergency Contact: Kranz,[name] Address: [address]          [place], [place] [address] Johnnette Litter of [place] Phone: [phone] Mobile Phone: [phone] Relation: Daughter  Code Status:  DNR  Goals of care: Advanced Directive information Advanced Directives 10/14/2017  Does Patient Have a Medical Advance Directive? Yes  Type of Advance Directive Out of facility DNR (pink MOST or yellow form)  Does patient want to make changes to medical advance directive? No - Patient declined  Copy of [place] in Chart? -  Would patient like information on creating a medical advance directive? No - Patient declined  Pre-existing out of facility DNR order (yellow form or pink MOST form) -     Chief Complaint  Patient presents with  . Discharge Note    The patient is seen for discharge.  Plan is for her to discharge 10/16/17.    HPI:  Pt is a 84 y.o. female seen today for discharge.  She is to discharge home on 10/16/17 with home health PT, OT, and Nursing services.     She has been admitted to [facility] on 08/29/17 from a recent hospitalization with NSTEMI. Troponin peaked at 1.16. CT angio of chest did not show PE. Cath showed mild nonobstructive CAD. Low dose betablocker was added. She has right lung cancer in remission after XRT in 2017. She had +stool guaiac and will not need  ASA. She has a PMH of NSTEMI, lung cancer in remission, arthritis, depression, COPD, CKD stage III, osteoporosis, and dyslipidemia.    Patient was admitted to this facility for short-term rehabilitation after the patient's recent hospitalization.  Patient has completed SNF rehabilitation and therapy has cleared the patient for discharge.   Past Medical History:  Diagnosis Date  . Cancer ([place])    lung ca  . COPD (chronic obstructive pulmonary disease) ([place])   . Hernia, umbilical   . Hip pain, right   . History of radiation therapy 05/02/12-05/04/12,&05/09/12   rul lung 54Gy/35f  . HLD (hyperlipidemia)   . Lung mass   . Osteoporosis   . PAC (premature atrial contraction)   . RA (rheumatoid arthritis) ([place]   . Shingles    Past Surgical History:  Procedure Laterality Date  . LEFT HEART CATH AND CORONARY ANGIOGRAPHY N/A 08/26/2017   Procedure: LEFT HEART CATH AND CORONARY ANGIOGRAPHY;  Surgeon: JMartinique Peter M, MD;  [facility];  Service: Cardiovascular;  Laterality: N/A;  . right lobectomy      Allergies  Allergen Reactions  . Latex Other (See Comments)    Blisters on her mouth  . Boniva [Ibandronic Acid]     Muscle aches  . Darvocet [Propoxyphene N-Acetaminophen] Nausea And Vomiting  . Erythromycin Nausea And Vomiting  . Morphine And Related Nausea And Vomiting  . Oxycontin [Oxycodone Hcl] Nausea And Vomiting  . Sulfa Antibiotics Nausea And Vomiting  . Tramadol Nausea And Vomiting  . Vicodin [Hydrocodone-Acetaminophen]  dizzy    Outpatient Encounter Medications as of 10/14/2017  Medication Sig  . acetaminophen (TYLENOL) 325 MG tablet Take 650 mg by mouth every 8 (eight) hours as needed.  . carvedilol (COREG) 3.125 MG tablet Take 1 tablet (3.125 mg total) by mouth 2 (two) times daily with a meal.  . folic acid (FOLVITE) 1 MG tablet Take 1 tablet (1 mg total) by mouth daily.  . furosemide (LASIX) 20 MG tablet Take 1 tablet (20 mg total) by mouth as needed.  For SOB or ankle edema  . ipratropium-albuterol (DUONEB) 0.5-2.5 (3) MG/3ML SOLN Take 3 mLs by nebulization every 6 (six) hours as needed.  . Menthol, Topical Analgesic, (BIOFREEZE) 4 % GEL Apply 1 application topically 2 (two) times daily. Apply to right neck  . NUTRITIONAL SUPPLEMENT LIQD Take 120 mLs by mouth 3 (three) times daily. MedPass  . ondansetron (ZOFRAN) 4 MG tablet Take 4 mg by mouth every 8 (eight) hours as needed for nausea or vomiting.  . predniSONE (DELTASONE) 5 MG tablet Take 1 tablet (5 mg total) by mouth daily with breakfast.  . sertraline (ZOLOFT) 25 MG tablet Take 1 tablet (25 mg total) by mouth daily.  . [DISCONTINUED] traMADol (ULTRAM) 50 MG tablet Take 50 mg by mouth every 6 (six) hours as needed for moderate pain.  . [DISCONTINUED] Melatonin 3 MG TABS Take 1 tablet by mouth at bedtime.   No facility-administered encounter medications on file as of 10/14/2017.     Review of Systems  GENERAL: No change in appetite, no fatigue, no weight changes, no fever, chills or weakness MOUTH and THROAT: Denies oral discomfort, gingival pain or bleeding, pain from teeth or hoarseness   RESPIRATORY: no cough, SOB, DOE, wheezing, hemoptysis CARDIAC: No chest pain, edema or palpitations GI: No abdominal pain, diarrhea, constipation, heart burn, nausea or vomiting GU: Denies dysuria, frequency, hematuria, incontinence, or discharge PSYCHIATRIC: Denies feelings of depression or anxiety. No report of hallucinations, insomnia, paranoia, or agitation   Immunization History  Administered Date(s) Administered  . Influenza Whole 02/09/2012  . Influenza-Unspecified 01/17/2017  . Pneumococcal-Unspecified 04/19/2014   Pertinent  Health Maintenance Due  Topic Date Due  . PNA vac Low Risk Adult (2 of 2 - PCV13) 04/20/2015  . INFLUENZA VACCINE  11/17/2017  . DEXA SCAN  Discontinued   Fall Risk  01/12/2017 07/08/2016 03/25/2016 09/11/2015 06/26/2015  Falls in the past year? _0     Risk for fall due to : - - - - -    Vitals:   10/14/17 0859  BP: 124/68  Pulse: 69  Resp: 19  Temp: (!) 97 F (36.1 C)  TempSrc: Oral  SpO2: 98%  Weight: 93 lb 6.4 oz (42.4 kg)  Height: 4' 11" (1.499 m)   Body mass index is 18.86 kg/m.  Physical Exam  GENERAL APPEARANCE:  In no acute distress.  SKIN:  Skin is warm and dry.  MOUTH and THROAT: Lips are without lesions. Oral mucosa is moist and without lesions. Tongue is normal in shape, size, and color and without lesions RESPIRATORY: Breathing is even & unlabored, BS CTAB CARDIAC: RRR, no murmur,no extra heart sounds, trace left foot edema GI: Abdomen soft, normal BS, no masses, no tenderness EXTREMITIES:  Able to move X 4 extremities, bilateral hand fingers have arthritic deformities PSYCHIATRIC: Alert and oriented X 3. Affect and behavior are appropriate   Labs reviewed: 09/26/17  Glucose 91  Ca 8.1  Creatinine 0.91  eGFR 57.43 09/21/17  Wbc 6.1  hgb 9.2  hct 36.8  mcv 97.2  Platelet 171  Glucose 88  Ca 8.2  Creatinine 0.86  BUN 25.2  Na 144  k 4.0  eGFR 61.50 Recent Labs    07/12/17 1219 08/25/17 0002 08/26/17 0038 09/21/17 09/26/17  NA 140 136 137 144 142  K 4.3 4.9 3.7 4.0 3.9  CL 107 100* 99*  --   --   CO2 _0 --   --   GLUCOSE 123 105* 90  --   --   BUN 23 30* 18 25* 18  CREATININE 1.02 1.33* 1.27* 0.9 0.9  CALCIUM 9.1 8.7* 8.2*  --   --    Recent Labs    07/12/17 1219 08/26/17 0038  AST 15 20  ALT 11 11*  ALKPHOS 66 39  BILITOT 0.8 0.9  PROT 6.7 5.3*  ALBUMIN 3.4* 2.8*   Recent Labs    08/25/17 0002  08/27/17 0514 08/28/17 0520 08/29/17 0444 09/21/17  WBC 7.2   < > 9.8 9.4 11.1* 6.1  NEUTROABS 5.5  --   --   --   --  4  HGB 11.3*   < > 13.5 9.8* 10.5* 9.2*  HCT 34.5*   < > 42.1 31.4* 32.2* 27*  MCV 99.1   < > 100.2* 100.3* 100.6*  --   PLT 163   < > 177 194 218 171   < > = values in this interval not displayed.    Lab Results  Component Value Date   CHOL 168 08/26/2017   HDL 68  08/26/2017   LDLCALC 86 08/26/2017   TRIG 68 08/26/2017   CHOLHDL 2.5 08/26/2017    Assessment/Plan  1. NSTEMI (non-ST elevated myocardial infarction) (HCC) - stable - carvedilol (COREG) 3.125 MG tablet; Take 1 tablet (3.125 mg total) by mouth 2 (two) times daily with a meal.  Dispense: 60 tablet; Refill: 0  2. Rheumatoid arthritis involving multiple sites with positive rheumatoid factor ([place]) - was seen at rheumatology office and was given cortisone injection on left knee on 10/11/17 - Menthol, Topical Analgesic, (BIOFREEZE) 4 % GEL; Apply 1 application topically 2 (two) times daily. Apply to right neck  Dispense: 89 mL; Refill: 0 - predniSONE (DELTASONE) 5 MG tablet; Take 1 tablet (5 mg total) by mouth daily with breakfast.  Dispense: 30 tablet; Refill: 0  3. Anemia of chronic disease -  09/21/17 hgb 9.2, follow up with PCP regarding further work up of +stool guaiac   4. Depression, recurrent ([place]) - mood is stable - sertraline (ZOLOFT) 25 MG tablet; Take 1 tablet (25 mg total) by mouth daily.  Dispense: 30 tablet; Refill: 0  5. Edema of lower extremity - trace edema on left foot - furosemide (LASIX) 20 MG tablet; Take 1 tablet (20 mg total) by mouth daily as needed. For SOB or ankle edema  Dispense: 30 tablet; Refill: 0  6. COPD, mild ([place]) - no wheezing nor SOB, was last seen by pulmonology office on 10/10/17 - ipratropium-albuterol (DUONEB) 0.5-2.5 (3) MG/3ML SOLN; Take 3 mLs by nebulization every 6 (six) hours as needed.  Dispense: 360 mL; Refill: 0     I have filled out patient's discharge paperwork and written prescriptions.  Patient will receive home health PT, OT, and Nursing.  DME provided:  None  Total discharge time: Greater than 30 minutes Greater than 50% was spent in counseling and coordination of care.   Discharge time involved coordination of the  discharge process with Education officer, museum, nursing staff and therapy department. Medical justification for home health  services verified.   Durenda Age, NP [facility] [phone] (Monday-Friday 8:00 a.m. - 5:00 p.m.) [phone] (after hours)

## 2017-10-28 ENCOUNTER — Ambulatory Visit: Payer: Medicare Other | Admitting: Podiatry

## 2017-11-01 ENCOUNTER — Ambulatory Visit: Payer: Medicare Other | Admitting: Rheumatology

## 2017-11-04 ENCOUNTER — Encounter: Payer: Self-pay | Admitting: Podiatry

## 2017-11-04 ENCOUNTER — Ambulatory Visit (INDEPENDENT_AMBULATORY_CARE_PROVIDER_SITE_OTHER): Payer: Medicare Other | Admitting: Podiatry

## 2017-11-04 DIAGNOSIS — B351 Tinea unguium: Secondary | ICD-10-CM | POA: Diagnosis not present

## 2017-11-04 DIAGNOSIS — M79675 Pain in left toe(s): Secondary | ICD-10-CM

## 2017-11-04 DIAGNOSIS — M79674 Pain in right toe(s): Secondary | ICD-10-CM

## 2017-11-06 NOTE — Progress Notes (Signed)
Subjective: [name] presents today with cc of painful, discolored, thick toenails which interfere with daily activities. Pain is aggravated when wearing enclosed shoe gear. Pain is getting progressively worse and relieved with periodic professional debridement.  Her daughter has accompanied [name] to her visit on today.   [name] also has plantar calluses and refuses debridement of calluses on today's visit.   Objective: There were no vitals filed for this visit. Vascular Examination: Capillary refill time immediate x 10 digits Dorsalis pedis and posterior tibial pulses present b/l No digital hair x 10 digits Skin temperature warm to warm b/l  Dermatological Examination: Skin thin and atrophic b/l Toenails 1-5 b/l discolored, thick, dystrophic with subungual debris and pain with palpation to nailbeds due to thickness of nails. Plantar tylomas ball of both feet.  Musculoskeletal: Muscle strength 5/5 to all LE muscle groups Foot deformities consistent with RA: severe bunion defomity b/l and contracted digits 2-5 b/l.  Neurological: Sensation intact with 10 gram monofilament. Vibratory sensation intact.  Assessment: Painful onychomycosis toenails 1-5 b/l   Plan: 1. Toenails 1-5 b/l were debrided in length and girth without iatrogenic bleeding. 2. Patient declined debridement of calluses on today's visit. 3. Patient to continue soft, supportive shoe gear 4. Patient to report any pedal injuries to medical professional immediately. 5. Follow up 3 months.  6. Patient/POA to call should there be a concern in the interim.

## 2017-11-21 ENCOUNTER — Ambulatory Visit: Payer: Medicare Other | Admitting: Pulmonary Disease

## 2017-11-23 ENCOUNTER — Telehealth: Payer: Self-pay | Admitting: Pulmonary Disease

## 2017-11-23 DIAGNOSIS — J449 Chronic obstructive pulmonary disease, unspecified: Secondary | ICD-10-CM

## 2017-11-23 MED ORDER — IPRATROPIUM-ALBUTEROL 0.5-2.5 (3) MG/3ML IN SOLN: 3.0000 mL | RESPIRATORY_TRACT | 5 refills | Status: DC | PRN

## 2017-11-23 NOTE — Telephone Encounter (Signed)
Signed Rx received and given to PCCs Nothing further needed; will sign off

## 2017-11-23 NOTE — Telephone Encounter (Signed)
Called spoke with patient's daughter [name] who reported that they are having difficulty with insurance wanted to pay for patient's nebulizer medication.  [name] unsure if the medication is the Duoneb as she was at work, but this is the only neb med on file for patient currently).  Duoneb was sent to local [facility]. Maudry Mayhew that this is typically the case when nebs are sent to a local pharmacy so we try to send the Rx to a DME company.  [name] okay with this and voiced her understanding.  Rx sent

## 2017-11-25 ENCOUNTER — Encounter: Payer: Self-pay | Admitting: Pulmonary Disease

## 2017-11-25 ENCOUNTER — Ambulatory Visit (INDEPENDENT_AMBULATORY_CARE_PROVIDER_SITE_OTHER): Payer: Medicare Other | Admitting: Pulmonary Disease

## 2017-11-25 VITALS — BP 132/62 | HR 63 | Ht 59.0 in | Wt 90.8 lb

## 2017-11-25 DIAGNOSIS — J438 Other emphysema: Secondary | ICD-10-CM

## 2017-11-25 DIAGNOSIS — J449 Chronic obstructive pulmonary disease, unspecified: Secondary | ICD-10-CM

## 2017-11-25 LAB — PULMONARY FUNCTION TEST
DL/VA % pred: 93 %
DL/VA: 3.65 ml/min/mmHg/L
DLCO unc % pred: 49 %
DLCO unc: 8 ml/min/mmHg
FEF 25-75 Post: 0.38 L/sec
FEF 25-75 Pre: 0.41 L/sec
FEF2575-%Change-Post: -8 %
FEF2575-%Pred-Post: 48 %
FEF2575-%Pred-Pre: 53 %
FEV1-%Change-Post: -2 %
FEV1-%Pred-Post: 66 %
FEV1-%Pred-Pre: 67 %
FEV1-Post: 0.79 L
FEV1-Pre: 0.8 L
FEV1FVC-%Change-Post: -1 %
FEV1FVC-%Pred-Pre: 82 %
FEV6-%Change-Post: -3 %
FEV6-%Pred-Post: 84 %
FEV6-%Pred-Pre: 87 %
FEV6-Post: 1.29 L
FEV6-Pre: 1.33 L
FEV6FVC-%Change-Post: -2 %
FEV6FVC-%Pred-Post: 106 %
FEV6FVC-%Pred-Pre: 108 %
FVC-%Change-Post: 0 %
FVC-%Pred-Post: 79 %
FVC-%Pred-Pre: 80 %
FVC-Post: 1.32 L
FVC-Pre: 1.33 L
Post FEV1/FVC ratio: 60 %
Post FEV6/FVC ratio: 98 %
Pre FEV1/FVC ratio: 61 %
Pre FEV6/FVC Ratio: 100 %
RV % pred: 105 %
RV: 2.34 L
TLC % pred: 86 %
TLC: 3.61 L

## 2017-11-25 NOTE — Progress Notes (Signed)
[name]    [id number]    Jul 29, 1929  Primary Care Physician:Shaw, [name], MD  Referring Physician: Mayra Neer, MD 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  Follows with Dr. Estanislado Pandy for rheumatoid arthritis.  Previously treated with methotrexate but is currently on chronic prednisone. She has a history of COPD but no PFTs on record.  Hospitalized in May 19 for non-ST elevation MI, she underwent a cardiac cath which did not show significant obstructive coronary artery disease.  She is currently at a nursing home and is due to move to her own house this week.  His rehab stay has been complicated with acute on chronic respiratory failure thought to be secondary to COPD. Denies dyspnea on exertion, cough, sputum production, wheezing.   Interim history: Now moved to home from the nursing facility.  States that breathing is doing well Currently on duo nebs twice daily.  No dyspnea, cough, sputum production, wheezing.  Outpatient Encounter Medications as of 11/25/2017  Medication Sig  . acetaminophen (TYLENOL) 325 MG tablet Take 650 mg by mouth every 8 (eight) hours as needed.  . carvedilol (COREG) 3.125 MG tablet Take 1 tablet (3.125 mg total) by mouth 2 (two) times daily with a meal.  . folic acid (FOLVITE) 1 MG tablet Take 1 tablet (1 mg total) by mouth daily.  . furosemide (LASIX) 20 MG tablet Take 1 tablet (20 mg total) by mouth daily as needed. For SOB or ankle edema  . ipratropium-albuterol (DUONEB) 0.5-2.5 (3) MG/3ML SOLN Take 3 mLs by nebulization every 4 (four) hours as needed (wheezing, shortness of breath). Dx: J44.9  . Menthol, Topical Analgesic, (BIOFREEZE) 4 % GEL Apply 1 application topically 2 (two) times daily. Apply to right neck  . NUTRITIONAL  SUPPLEMENT LIQD Take 120 mLs by mouth 3 (three) times daily. MedPass  . predniSONE (DELTASONE) 5 MG tablet Take 1 tablet (5 mg total) by mouth daily with breakfast.  . sertraline (ZOLOFT) 25 MG tablet Take 1 tablet (25 mg total) by mouth daily.  . [DISCONTINUED] ciprofloxacin (CIPRO) 500 MG tablet    No facility-administered encounter medications on file as of 11/25/2017.     Allergies as of 11/25/2017 - Review Complete 11/25/2017  Allergen Reaction Noted  . Latex Other (See Comments) 03/21/2012  . Boniva [ibandronic acid]  03/21/2012  . Darvocet [propoxyphene n-acetaminophen] Nausea And Vomiting 03/21/2012  . Erythromycin Nausea And Vomiting 03/21/2012  . Morphine and related Nausea And Vomiting 03/21/2012  . Oxycontin [oxycodone hcl] Nausea And Vomiting 03/21/2012  . Sulfa antibiotics Nausea And Vomiting 03/21/2012  . Tramadol Nausea And Vomiting 12/23/2012  . Vicodin [hydrocodone-acetaminophen]  03/21/2012    Past Medical History:  Diagnosis Date  . Cancer ([place])    lung ca  . COPD (chronic obstructive pulmonary disease) ([place])   . Hernia, umbilical   . Hip pain, right   . History of radiation therapy 05/02/12-05/04/12,&05/09/12   rul lung 54Gy/40fx  . HLD (hyperlipidemia)   . Lung mass   . Osteoporosis   . PAC (premature atrial contraction)   . RA (rheumatoid arthritis) ([place])   . Shingles     Past Surgical History:  Procedure Laterality Date  . LEFT HEART CATH AND CORONARY ANGIOGRAPHY N/A 08/26/2017   Procedure: LEFT HEART CATH AND  CORONARY ANGIOGRAPHY;  Surgeon: Martinique, Peter M, MD;  Location: [facility];  Service: Cardiovascular;  Laterality: N/A;  . right lobectomy      Family History  Problem Relation Age of Onset  . Cancer Daughter   . Cancer Mother     Social History   Socioeconomic History  . Marital status: Widowed    Spouse name: Not on file  . Number of children: Not on file  . Years of education: Not on file  . Highest education level: Not on  file  Occupational History  . Occupation: retired    Fish farm manager: RETIRED  Social Needs  . Financial resource strain: Not on file  . Food insecurity:    Worry: Not on file    Inability: Not on file  . Transportation needs:    Medical: Not on file    Non-medical: Not on file  Tobacco Use  . Smoking status: Former Smoker    Types: Cigarettes    Last attempt to quit: 1995    Years since quitting: 24.6  . Smokeless tobacco: Never Used  Substance and Sexual Activity  . Alcohol use: No  . Drug use: Never  . Sexual activity: Not on file  Lifestyle  . Physical activity:    Days per week: Not on file    Minutes per session: Not on file  . Stress: Not on file  Relationships  . Social connections:    Talks on phone: Not on file    Gets together: Not on file    Attends religious service: Not on file    Active member of club or organization: Not on file    Attends meetings of clubs or organizations: Not on file    Relationship status: Not on file  . Intimate partner violence:    Fear of current or ex partner: Not on file    Emotionally abused: Not on file    Physically abused: Not on file    Forced sexual activity: Not on file  Other Topics Concern  . Not on file  Social History Narrative   Lives in ALF at [facility].  08/2017 resident of [facility].      Review of systems: Review of Systems  Constitutional: Negative for fever and chills.  HENT: Negative.   Eyes: Negative for blurred vision.  Respiratory: as per HPI  Cardiovascular: Negative for chest pain and palpitations.  Gastrointestinal: Negative for vomiting, diarrhea, blood per rectum. Genitourinary: Negative for dysuria, urgency, frequency and hematuria.  Musculoskeletal: Negative for myalgias, back pain and joint pain.  Skin: Negative for itching and rash.  Neurological: Negative for dizziness, tremors, focal weakness, seizures and loss of consciousness.  Endo/Heme/Allergies: Negative for  environmental allergies.  Psychiatric/Behavioral: Negative for depression, suicidal ideas and hallucinations.  All other systems reviewed and are negative.  Physical Exam: Blood pressure 132/62, pulse 63, height 4\' 11"  (1.499 m), weight 90 lb 12.8 oz (41.2 kg), SpO2 95 %. Gen:      No acute distress HEENT:  EOMI, sclera anicteric Neck:     No masses; no thyromegaly Lungs:    Clear to auscultation bilaterally; normal respiratory effort CV:         Regular rate and rhythm; no murmurs Abd:      + bowel sounds; soft, non-tender; no palpable masses, no distension Ext:    No edema; adequate peripheral perfusion Skin:      Warm and dry; no rash Neuro: alert and oriented x  3 Psych: normal mood and affect  Data Reviewed: Echo cardiogram 08/26/17 LVEF 65-70%, severe LVH, normal wall motion, grade 1 DD, indeterminate LV filling pressure, aortic valve sclerosis with trivial AI, trivial MR, normal LA size, normal IVC.  CT Angio 08/25/2017- cardiomegaly, coronary atherosclerosis.  Atelectasis, bronchiectasis in the right upper lobe.  Bibasilar scarring with nodular opacity in the left lower lobe stable compared to 2014.  I reviewed the images personally  PFTs 11/25/2017 FVC 1.32 [79%], FEV1 0.79 [66%], F/F 60, TLC 86%, DLCO 49% Moderate obstruction, severe diffusion defect.  Assessment:  Moderate COPD PFTs reviewed with moderate obstructive airway disease.  She is currently on duo nebs which she is using twice daily Symptoms are well controlled so we will continue the same.  There is no need to escalate therapy at this point. We are avoiding inhalers given lack of dexterity due to rheumatoid arthritis.  Lung cancer status post XRT Imaging reviewed which shows evolving consolidation, bronchiectasis in the right upper lobe which could be post radiation changes.  In addition there are some nodular opacities that have remained stable.  We will continue to follow  this  Plan/Recommendations: -Continue duo nebs  Marshell Garfinkel MD [facility] 11/25/2017, 4:21 PM  CC: Mayra Neer, MD

## 2017-11-25 NOTE — Progress Notes (Signed)
PFT done today. 

## 2017-11-25 NOTE — Patient Instructions (Signed)
We will get you in touch with the DME company to ensure the duo nebs are delivered Continue duo nebs twice daily Follow-up in 6 months.

## 2017-11-30 ENCOUNTER — Telehealth: Payer: Self-pay | Admitting: *Deleted

## 2017-11-30 NOTE — Telephone Encounter (Signed)
RETURNED PATIENT'S DAUGHTER PHONE CALL, SPOKE WITH DAUGHTER

## 2018-01-09 ENCOUNTER — Telehealth: Payer: Self-pay | Admitting: *Deleted

## 2018-01-09 NOTE — Telephone Encounter (Signed)
CALLED PATIENT TO INFORM OF LAB ON 01-18-18 @ 11 AM AND HIS CT ON 01-18-18, - PATIENT TO ARRIVE @ 11:45 AM @ [facility], PT. TO BE NPO- 4 HRS. PRIOR TO TEST, PT. TO GET RESULTS ON 01-19-18 @ 1:30 PM WITH ALISON PERKINS, SPOKE WITH PATIENT'S DAUGHTER- MAXINE AND SHE IS AWARE OF THESE APPTS.

## 2018-01-18 ENCOUNTER — Ambulatory Visit
Admission: RE | Admit: 2018-01-18 | Discharge: 2018-01-18 | Disposition: A | Discharge status: Home / Self Care | Payer: Medicare Other | Source: Ambulatory Visit | Attending: Radiation Oncology | Admitting: Radiation Oncology

## 2018-01-18 ENCOUNTER — Ambulatory Visit (HOSPITAL_COMMUNITY)
Admission: RE | Admit: 2018-01-18 | Discharge: 2018-01-18 | Disposition: A | Discharge status: Home / Self Care | Payer: Medicare Other | Source: Ambulatory Visit | Attending: Radiation Oncology | Admitting: Radiation Oncology

## 2018-01-18 DIAGNOSIS — J439 Emphysema, unspecified: Secondary | ICD-10-CM | POA: Insufficient documentation

## 2018-01-18 DIAGNOSIS — C3411 Malignant neoplasm of upper lobe, right bronchus or lung: Secondary | ICD-10-CM | POA: Diagnosis not present

## 2018-01-18 DIAGNOSIS — I2510 Atherosclerotic heart disease of native coronary artery without angina pectoris: Secondary | ICD-10-CM | POA: Diagnosis not present

## 2018-01-18 DIAGNOSIS — Z923 Personal history of irradiation: Secondary | ICD-10-CM | POA: Insufficient documentation

## 2018-01-18 DIAGNOSIS — R911 Solitary pulmonary nodule: Secondary | ICD-10-CM | POA: Insufficient documentation

## 2018-01-18 DIAGNOSIS — I700 Atherosclerosis of aorta: Secondary | ICD-10-CM | POA: Insufficient documentation

## 2018-01-18 LAB — BUN & CREATININE (CHCC)
BUN: 26 mg/dL — ABNORMAL HIGH (ref 8–23)
Creatinine: 1.1 mg/dL — ABNORMAL HIGH (ref 0.44–1.00)
GFR, Est AFR Am: 51 mL/min — ABNORMAL LOW (ref >60)
GFR, Estimated: 44 mL/min — ABNORMAL LOW (ref >60)

## 2018-01-18 MED ORDER — SODIUM CHLORIDE 0.9 % IJ SOLN
INTRAMUSCULAR | Status: AC
  Filled 2018-01-18: qty 50

## 2018-01-18 MED ORDER — IOHEXOL 300 MG/ML  SOLN
75.0000 mL | Freq: Once | INTRAMUSCULAR | Status: AC | PRN
  Administered 2018-01-18: 60 mL via INTRAVENOUS

## 2018-01-18 NOTE — Progress Notes (Signed)
[name] 84 y.o.woman with a 2.7 cm clinical stage IA squamous cell carcinoma of the right upper lung radiation completed 05-09-12, 01-18-18 review CT chest w contrast 6 month FU.   Respiratory complaints, if any: Denies SOB,coughing,wheezing at times Swallowing Problems/Pain/Difficulty swallowing: No problems Appetite :Good Pain:No Swelling to feet and legs. Wt Readings from Last 3 Encounters:  01/19/18 90 lb 3.2 oz (40.9 kg)  11/25/17 90 lb 12.8 oz (41.2 kg)  10/14/17 93 lb 6.4 oz (42.4 kg)  BP (!) 136/45 (BP Location: Right Arm, Patient Position: Sitting)   Pulse 60   Temp 98.4 F (36.9 C) (Oral)   Resp 20   Ht 4\' 11"  (1.499 m)   Wt 90 lb 3.2 oz (40.9 kg)   SpO2 95%   BMI 18.22 kg/m

## 2018-01-19 ENCOUNTER — Other Ambulatory Visit: Payer: Self-pay

## 2018-01-19 ENCOUNTER — Encounter: Payer: Self-pay | Admitting: Radiation Oncology

## 2018-01-19 ENCOUNTER — Ambulatory Visit
Admission: RE | Admit: 2018-01-19 | Discharge: 2018-01-19 | Disposition: A | Discharge status: Home / Self Care | Payer: Medicare Other | Source: Ambulatory Visit | Attending: Radiation Oncology | Admitting: Radiation Oncology

## 2018-01-19 VITALS — BP 136/45 | HR 60 | Temp 98.4°F | Resp 20 | Ht 59.0 in | Wt 90.2 lb

## 2018-01-19 DIAGNOSIS — Z923 Personal history of irradiation: Secondary | ICD-10-CM | POA: Diagnosis not present

## 2018-01-19 DIAGNOSIS — Z881 Allergy status to other antibiotic agents status: Secondary | ICD-10-CM | POA: Insufficient documentation

## 2018-01-19 DIAGNOSIS — Z79899 Other long term (current) drug therapy: Secondary | ICD-10-CM | POA: Diagnosis not present

## 2018-01-19 DIAGNOSIS — I252 Old myocardial infarction: Secondary | ICD-10-CM | POA: Insufficient documentation

## 2018-01-19 DIAGNOSIS — Z888 Allergy status to other drugs, medicaments and biological substances status: Secondary | ICD-10-CM | POA: Diagnosis not present

## 2018-01-19 DIAGNOSIS — M069 Rheumatoid arthritis, unspecified: Secondary | ICD-10-CM | POA: Insufficient documentation

## 2018-01-19 DIAGNOSIS — C3411 Malignant neoplasm of upper lobe, right bronchus or lung: Secondary | ICD-10-CM | POA: Diagnosis present

## 2018-01-19 DIAGNOSIS — J449 Chronic obstructive pulmonary disease, unspecified: Secondary | ICD-10-CM | POA: Insufficient documentation

## 2018-01-19 DIAGNOSIS — Z882 Allergy status to sulfonamides status: Secondary | ICD-10-CM | POA: Insufficient documentation

## 2018-01-19 DIAGNOSIS — Z885 Allergy status to narcotic agent status: Secondary | ICD-10-CM | POA: Diagnosis not present

## 2018-01-19 DIAGNOSIS — Z87891 Personal history of nicotine dependence: Secondary | ICD-10-CM | POA: Diagnosis not present

## 2018-01-19 HISTORY — DX: Acute myocardial infarction, unspecified: I21.9

## 2018-01-22 ENCOUNTER — Other Ambulatory Visit: Payer: Self-pay | Admitting: Radiation Oncology

## 2018-01-22 DIAGNOSIS — C3411 Malignant neoplasm of upper lobe, right bronchus or lung: Secondary | ICD-10-CM

## 2018-01-22 NOTE — Progress Notes (Addendum)
Radiation Oncology         [phone] ________________________________  Name: [name] MRN: [id number]  Date: 01/19/2018  DOB: Aug 29, 1929    Follow-Up Visit Note  CC: Mayra Neer, MD  Gaye Pollack, MD  Diagnosis: 84 y.o. woman with a history of a 2.7 cm clinical stage IA squamous cell carcinoma of the right upper lung (She has left lower lung nodule also, which was negative on PET and has not been biopsied)    ICD-10-CM   1. Malignant neoplasm of upper lobe of right lung (HCC) C34.11   2. Primary cancer of right upper lobe of lung (HCC) C34.11     Interval Since Last Radiation: 5 years, 9 months  05/02/2012, 05/04/2012, 1/21/2014SBRT Treatment:  The RUL tumor was treated to 54 Gy in 3 fractions of 18 Gy.  Narrative:  The patient returns today for routine follow-up. In summary she was treated surgically for a stage I lung cancer in the 1990s. She was found to have a PET positive lesion in 2014 and this was biopsied consistent with SCC of the right upper lobe. She received SBRT, and has been since followed in surveillance. She has had several short interval scans due to some changes in the primary lesion and development of another small right upper lobe lesion. Follow up imaging confirmed these to be radiation mass like fibrosis. She has since been stable without progression. She had a reassuring CT of the chest on 01/18/18. She comes today to review these findings.  On review of systems, the patient reports that she is doing well overall. She denies any chest pain, shortness of breath, cough, fevers, chills, night sweats, unintended weight changes. She denies any bowel or bladder disturbances, and denies abdominal pain, nausea or vomiting. She denies any new musculoskeletal or joint aches or pains, new skin lesions or concerns. A complete review of systems is obtained and is otherwise negative.    Past Medical History:  Past Medical History:  Diagnosis Date  . Cancer  ([place])    lung ca  . COPD (chronic obstructive pulmonary disease) ([place])   . Hernia, umbilical   . Hip pain, right   . History of radiation therapy 05/02/12-05/04/12,&05/09/12   rul lung 54Gy/38fx  . HLD (hyperlipidemia)   . Lung mass   . Myocardial infarction ([place]) 08/2017  . Osteoporosis   . PAC (premature atrial contraction)   . RA (rheumatoid arthritis) ([place])   . Shingles     Past Surgical History: Past Surgical History:  Procedure Laterality Date  . LEFT HEART CATH AND CORONARY ANGIOGRAPHY N/A 08/26/2017   Procedure: LEFT HEART CATH AND CORONARY ANGIOGRAPHY;  Surgeon: Martinique, Peter M, MD;  Location: [facility];  Service: Cardiovascular;  Laterality: N/A;  . right lobectomy      Social History:  Social History   Socioeconomic History  . Marital status: Widowed    Spouse name: Not on file  . Number of children: Not on file  . Years of education: Not on file  . Highest education level: Not on file  Occupational History  . Occupation: retired    Fish farm manager: RETIRED  Social Needs  . Financial resource strain: Not on file  . Food insecurity:    Worry: Not on file    Inability: Not on file  . Transportation needs:    Medical: Not on file    Non-medical: Not on file  Tobacco Use  . Smoking status: Former Smoker    Types:  Cigarettes    Last attempt to quit: 1995    Years since quitting: 24.7  . Smokeless tobacco: Never Used  Substance and Sexual Activity  . Alcohol use: No  . Drug use: Never  . Sexual activity: Not on file  Lifestyle  . Physical activity:    Days per week: Not on file    Minutes per session: Not on file  . Stress: Not on file  Relationships  . Social connections:    Talks on phone: Not on file    Gets together: Not on file    Attends religious service: Not on file    Active member of club or organization: Not on file    Attends meetings of clubs or organizations: Not on file    Relationship status: Not on file  . Intimate partner  violence:    Fear of current or ex partner: Not on file    Emotionally abused: Not on file    Physically abused: Not on file    Forced sexual activity: Not on file  Other Topics Concern  . Not on file  Social History Narrative   Lives in ALF at [facility].  08/2017 resident of [facility].    01-19-18 Unable to ask abuse questions daughter with her today.    Family History:  Family History  Problem Relation Age of Onset  . Cancer Daughter   . Cancer Mother       ALLERGIES:  is allergic to latex; boniva [ibandronic acid]; darvocet [propoxyphene n-acetaminophen]; erythromycin; morphine and related; oxycontin [oxycodone hcl]; sulfa antibiotics; tramadol; and vicodin [hydrocodone-acetaminophen].  Meds: Current Outpatient Medications  Medication Sig Dispense Refill  . acetaminophen (TYLENOL) 325 MG tablet Take 650 mg by mouth every 8 (eight) hours as needed.    . carvedilol (COREG) 3.125 MG tablet Take 1 tablet (3.125 mg total) by mouth 2 (two) times daily with a meal. 60 tablet 0  . folic acid (FOLVITE) 1 MG tablet Take 1 tablet (1 mg total) by mouth daily.    . furosemide (LASIX) 20 MG tablet Take 1 tablet (20 mg total) by mouth daily as needed. For SOB or ankle edema 30 tablet 0  . ipratropium-albuterol (DUONEB) 0.5-2.5 (3) MG/3ML SOLN Take 3 mLs by nebulization every 4 (four) hours as needed (wheezing, shortness of breath). Dx: J44.9 360 mL 5  . NUTRITIONAL SUPPLEMENT LIQD Take 120 mLs by mouth 3 (three) times daily. MedPass    . predniSONE (DELTASONE) 5 MG tablet Take 1 tablet (5 mg total) by mouth daily with breakfast. 30 tablet 0  . sertraline (ZOLOFT) 25 MG tablet Take 1 tablet (25 mg total) by mouth daily. (Patient taking differently: Take 100 mg by mouth daily. Started 100 mg po daily recently per daughter) 30 tablet 0  . Menthol, Topical Analgesic, (BIOFREEZE) 4 % GEL Apply 1 application topically 2 (two) times daily. Apply to right neck (Patient not  taking: Reported on 01/19/2018) 89 mL 0   No current facility-administered medications for this encounter.     Physical Findings:  height is 4\' 11"  (1.499 m) and weight is 90 lb 3.2 oz (40.9 kg). Her oral temperature is 98.4 F (36.9 C). Her blood pressure is 136/45 (abnormal) and her pulse is 60. Her respiration is 20 and oxygen saturation is 95%.   In general this is a elderly, well appearing Caucasian female in no acute distress. She's alert and oriented x4 and appropriate throughout the examination. Cardiovascular exam reveals a regular  rate and rhythm, no clicks or rubs  are auscultated. Holosystolic murmur is noted over the precordium consistent with prior examination. Chest is clear to auscultation bilaterally.   Lab Findings: Lab Results  Component Value Date   WBC 6.1 09/21/2017   WBC 11.1 (H) 08/29/2017   HGB 9.2 (A) 09/21/2017   HCT 27 (A) 09/21/2017   PLT 171 09/21/2017    Lab Results  Component Value Date   NA 142 09/26/2017   NA 142 07/01/2016   K 3.9 09/26/2017   K 4.2 07/01/2016   CHLORIDE 105 07/01/2016   CO2 30 08/26/2017   CO2 26 07/01/2016   GLUCOSE 90 08/26/2017   GLUCOSE 105 07/01/2016   BUN 26 (H) 01/18/2018   BUN 18 09/26/2017   BUN 28.2 (H) 01/07/2017   CREATININE 1.10 (H) 01/18/2018   CREATININE 1.1 01/07/2017   BILITOT 0.9 08/26/2017   ALKPHOS 39 08/26/2017   AST 20 08/26/2017   ALT 11 (L) 08/26/2017   PROT 5.3 (L) 08/26/2017   ALBUMIN 2.8 (L) 08/26/2017   CALCIUM 8.2 (L) 08/26/2017   CALCIUM 9.7 07/01/2016   ANIONGAP 8 08/26/2017    Radiographic Findings: Ct Chest W Contrast  Result Date: 01/18/2018 CLINICAL DATA:  History of right lung cancer.  Follow-up. EXAM: CT CHEST WITH CONTRAST TECHNIQUE: Multidetector CT imaging of the chest was performed during intravenous contrast administration. CONTRAST:  57mL OMNIPAQUE IOHEXOL 300 MG/ML  SOLN COMPARISON:  08/25/2017 FINDINGS: Cardiovascular: Moderate cardiac enlargement. No pericardial  effusion. Aortic atherosclerosis. Calcification in the left main coronary artery noted and RCA. Mediastinum/Nodes: Normal appearance of the thyroid gland. The trachea appears patent and is midline. Normal appearance of the esophagus. No axillary or supraclavicular adenopathy. No mediastinal or hilar adenopathy. Lungs/Pleura: Postsurgical changes and scarring within the right upper lung zone identified with associated volume loss. Masslike architectural distortion within the anterolateral right upper lung measures 4.2 x 3.0 cm, image 54/2. Unchanged from previous exam. Pulmonary nodule within the central left lower lobe measures 1 by 0.6 cm, image 72/5. Stable from previous exam. Left upper lobe lung nodule measures 6 mm, image 30/5. Unchanged from previous exam. No new or enlarging pulmonary nodules or masses identified. Upper Abdomen: Tiny low-density structures along the dome of liver are unchanged from 07/12/2017. No acute abnormality identified within the upper abdomen. Cyst identified within the upper pole of left kidney. Musculoskeletal: Scoliosis and degenerative disc disease. IMPRESSION: 1. Stable exam. 2. Masslike architectural distortion within the right upper lung is stable from previous exam compatible with post treatment changes. 3. Stable left lower lobe pulmonary nodule. 4. Aortic Atherosclerosis (ICD10-I70.0) and Emphysema (ICD10-J43.9). 5. Left main and RCA coronary artery atherosclerotic calcifications. Electronically Signed   By: Kerby Moors M.D.   On: 01/18/2018 16:26   Impression:  1. Stage IA, squamous cell carcinoma of the right upper lobe lung cancer. The patient appears to be clinically without progressive or new disease. As she is now past 5 years from her treatment, we discussed that NCCN guidelines support annual imaging. She is in agreement with this plan but will contact us sooner if she has questions or concerns prior to that visit.      Carola Rhine, PAC

## 2018-01-25 ENCOUNTER — Encounter: Payer: Self-pay | Admitting: Internal Medicine

## 2018-01-25 ENCOUNTER — Ambulatory Visit (INDEPENDENT_AMBULATORY_CARE_PROVIDER_SITE_OTHER): Payer: Medicare Other | Admitting: Internal Medicine

## 2018-01-25 VITALS — BP 118/54 | HR 57 | Ht 59.0 in | Wt 91.0 lb

## 2018-01-25 DIAGNOSIS — I214 Non-ST elevation (NSTEMI) myocardial infarction: Secondary | ICD-10-CM | POA: Diagnosis not present

## 2018-01-25 DIAGNOSIS — R609 Edema, unspecified: Secondary | ICD-10-CM

## 2018-01-25 NOTE — Patient Instructions (Signed)
Medication Instructions:  Continue current medications If you need a refill on your cardiac medications before your next appointment, please call your pharmacy.     Follow-Up: Follow up as needed with Dr. Debara Pickett

## 2018-01-25 NOTE — Progress Notes (Signed)
OFFICE NOTE  Chief Complaint:  Evaluate edema  Primary Care Physician: Mayra Neer, MD  HPI:  [name] is a 84 y.o. female with a past medial history significant for lung cancer, COPD, history of x-ray therapy, PACs, dyslipidemia and recent hospitalization in May with acute shortness of breath and nausea, seen by my partners but not myself.  She was eventually found to have elevated troponin and admitted to cardiology.  Work-up included left heart catheterization which showed mild coronary artery disease and normal LV function.  Subsequently an echocardiogram also showed normal LV function with severe LVH.  She is on a diuretic as well as low-dose beta-blocker.  Since then she is done well without any recurrent symptoms.  She continues to have edema, which is mostly dependent.  There is a history of PVD.  She is intolerant of compression stockings.  PMHx:  Past Medical History:  Diagnosis Date  . Cancer ([place])    lung ca  . COPD (chronic obstructive pulmonary disease) ([place])   . Hernia, umbilical   . Hip pain, right   . History of radiation therapy 05/02/12-05/04/12,&05/09/12   rul lung 54Gy/38fx  . HLD (hyperlipidemia)   . Lung mass   . Myocardial infarction ([place]) 08/2017  . Osteoporosis   . PAC (premature atrial contraction)   . RA (rheumatoid arthritis) ([place])   . Shingles     Past Surgical History:  Procedure Laterality Date  . LEFT HEART CATH AND CORONARY ANGIOGRAPHY N/A 08/26/2017   Procedure: LEFT HEART CATH AND CORONARY ANGIOGRAPHY;  Surgeon: Martinique, Peter M, MD;  Location: [facility];  Service: Cardiovascular;  Laterality: N/A;  . right lobectomy      FAMHx:  Family History  Problem Relation Age of Onset  . Cancer Daughter   . Cancer Mother     SOCHx:   reports that she quit smoking about 24 years ago. Her smoking use included cigarettes. She has never used smokeless tobacco. She reports that she does not drink alcohol or use drugs.  ALLERGIES:    Allergies  Allergen Reactions  . Latex Other (See Comments)    Blisters on her mouth  . Boniva [Ibandronic Acid]     Muscle aches  . Darvocet [Propoxyphene N-Acetaminophen] Nausea And Vomiting  . Erythromycin Nausea And Vomiting  . Morphine And Related Nausea And Vomiting  . Oxycontin [Oxycodone Hcl] Nausea And Vomiting  . Sulfa Antibiotics Nausea And Vomiting  . Tramadol Nausea And Vomiting  . Vicodin [Hydrocodone-Acetaminophen]     dizzy    ROS: Pertinent items noted in HPI and remainder of comprehensive ROS otherwise negative.  HOME MEDS: Current Outpatient Medications on File Prior to Visit  Medication Sig Dispense Refill  . acetaminophen (TYLENOL) 325 MG tablet Take 650 mg by mouth every 8 (eight) hours as needed.    . carvedilol (COREG) 3.125 MG tablet Take 1 tablet (3.125 mg total) by mouth 2 (two) times daily with a meal. 60 tablet 0  . folic acid (FOLVITE) 1 MG tablet Take 1 tablet (1 mg total) by mouth daily.    . furosemide (LASIX) 20 MG tablet Take 1 tablet (20 mg total) by mouth daily as needed. For SOB or ankle edema 30 tablet 0  . ipratropium-albuterol (DUONEB) 0.5-2.5 (3) MG/3ML SOLN Take 3 mLs by nebulization every 4 (four) hours as needed (wheezing, shortness of breath). Dx: J44.9 360 mL 5  . NUTRITIONAL SUPPLEMENT LIQD Take 120 mLs by mouth 3 (three) times daily. MedPass    .  predniSONE (DELTASONE) 5 MG tablet Take 1 tablet (5 mg total) by mouth daily with breakfast. 30 tablet 0  . sertraline (ZOLOFT) 100 MG tablet TK 1 T PO QD  3   No current facility-administered medications on file prior to visit.     LABS/IMAGING: No results found for this or any previous visit (from the past 48 hour(s)). No results found.  LIPID PANEL:    Component Value Date/Time   CHOL 168 08/26/2017 0038   TRIG 68 08/26/2017 0038   HDL 68 08/26/2017 0038   CHOLHDL 2.5 08/26/2017 0038   VLDL 14 08/26/2017 0038   LDLCALC 86 08/26/2017 0038     WEIGHTS: Wt Readings from Last  3 Encounters:  01/25/18 91 lb (41.3 kg)  01/19/18 90 lb 3.2 oz (40.9 kg)  11/25/17 90 lb 12.8 oz (41.2 kg)    VITALS: BP (!) 118/54   Pulse (!) 57   Ht 4\' 11"  (1.499 m)   Wt 91 lb (41.3 kg)   BMI 18.38 kg/m   EXAM: General appearance: alert and no distress Neck: no carotid bruit, no JVD and thyroid not enlarged, symmetric, no tenderness/mass/nodules Lungs: clear to auscultation bilaterally Heart: regular rate and rhythm Abdomen: soft, non-tender; bowel sounds normal; no masses,  no organomegaly Extremities: edema Trace right lower extremity edema Pulses: 2+ and symmetric Skin: Skin color, texture, turgor normal. No rashes or lesions Neurologic: Grossly normal Psych: Pleasant  EKG: Sinus bradycardia at 57-personally reviewed personally reviewed  ASSESSMENT: 1. NSTEMI-unknown etiology, possible Takatsubo 2. No significant obstructive coronary disease (08/2017 cath) 3. COPD 4. History of lung cancer status post XRT 5. Dependent edema  PLAN: 1.   [name] has had persistent dependent edema which is improved somewhat with the diuretic.  Although she has severe LVH and likely diastolic dysfunction, I doubt this is diastolic heart failure.  It may be more likely related to right heart dysfunction in the setting of long-standing COPD or peripheral venous disease.  Her symptoms do improve however with elevation of her feet at night and she would benefit from compression stockings.  As those are hard to get on, she may consider zip up stocking.  She should continue her diuretic.  I agree with low-dose beta-blocker.  This could help mitigate any recurrent heart failure symptoms or possible Takatsubo events.  No further suggestions at this time.  Follow-up as needed.  Pixie Casino, MD, [facility] [place] Director of the Advanced Lipid Disorders &  Cardiovascular Risk Reduction Clinic Diplomate of the American Board of Clinical Lipidology Attending  Cardiologist  Direct Dial: [phone]  Fax: [phone]  Website:  [email or web address] [email or web address] 01/25/2018, 4:25 PM

## 2018-02-20 ENCOUNTER — Telehealth: Payer: Self-pay | Admitting: Pulmonary Disease

## 2018-02-20 DIAGNOSIS — J449 Chronic obstructive pulmonary disease, unspecified: Secondary | ICD-10-CM

## 2018-02-20 MED ORDER — IPRATROPIUM-ALBUTEROL 0.5-2.5 (3) MG/3ML IN SOLN: 3.0000 mL | RESPIRATORY_TRACT | 5 refills | Status: DC | PRN

## 2018-02-20 NOTE — Telephone Encounter (Signed)
Spoke with pt's daughter (dpr on file), rx sent to preferred pharmacy.  Nothing further needed.

## 2018-03-01 ENCOUNTER — Other Ambulatory Visit: Payer: Self-pay | Admitting: Rheumatology

## 2018-03-01 NOTE — Telephone Encounter (Signed)
Last Visit: 10/11/17 Next Visit: due December 2019. Message sent to the front to schedule.   Okay to refill per Dr, Estanislado Pandy

## 2018-03-01 NOTE — Telephone Encounter (Signed)
Please schedule follow up visit. Patient due December 2019. Thanks!

## 2018-03-07 ENCOUNTER — Ambulatory Visit (INDEPENDENT_AMBULATORY_CARE_PROVIDER_SITE_OTHER): Payer: Medicare Other | Admitting: Podiatry

## 2018-03-07 DIAGNOSIS — B351 Tinea unguium: Secondary | ICD-10-CM | POA: Diagnosis not present

## 2018-03-07 DIAGNOSIS — M79674 Pain in right toe(s): Secondary | ICD-10-CM | POA: Diagnosis not present

## 2018-03-07 DIAGNOSIS — M79675 Pain in left toe(s): Secondary | ICD-10-CM | POA: Diagnosis not present

## 2018-03-07 DIAGNOSIS — L853 Xerosis cutis: Secondary | ICD-10-CM

## 2018-03-07 MED ORDER — AQUAPHOR EX OINT: TOPICAL_OINTMENT | CUTANEOUS | 3 refills | Status: DC | PRN

## 2018-03-07 NOTE — Patient Instructions (Signed)

## 2018-03-15 NOTE — Progress Notes (Deleted)
Office Visit Note  Patient: [name]             Date of Birth: 1929/10/01           MRN: [id number]             PCP: Mayra Neer, MD Referring: Mayra Neer, MD Visit Date: 03/29/2018 Occupation: @GUAROCC @  Subjective:  No chief complaint on file.   History of Present Illness: [name] is a 84 y.o. female ***   Activities of Daily Living:  Patient reports morning stiffness for *** {minute/hour:19697}.   Patient {ACTIONS;DENIES/REPORTS:21021675::"Denies"} nocturnal pain.  Difficulty dressing/grooming: {ACTIONS;DENIES/REPORTS:21021675::"Denies"} Difficulty climbing stairs: {ACTIONS;DENIES/REPORTS:21021675::"Denies"} Difficulty getting out of chair: {ACTIONS;DENIES/REPORTS:21021675::"Denies"} Difficulty using hands for taps, buttons, cutlery, and/or writing: {ACTIONS;DENIES/REPORTS:21021675::"Denies"}  No Rheumatology ROS completed.   PMFS History:  Patient Active Problem List   Diagnosis Date Noted  . Abnormal CT of the chest 09/01/2017  . Adult failure to thrive 09/01/2017  . Neurocognitive disorder 08/30/2017  . NSTEMI (non-ST elevated myocardial infarction) ([place]) 08/25/2017  . 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 Primary osteoarthritis of both hands 06/09/2016  . Primary osteoarthritis of both feet 06/09/2016  . History of lung cancer 06/09/2016  . High risk medication use 06/09/2016  . Aortic calcification ([place]) 05/10/2013  . Coronary artery calcification 05/10/2013  . Protein-calorie malnutrition, severe ([place]) 12/25/2012  . Depression, recurrent ([place]) 12/25/2012  . Anemia of chronic disease 12/24/2012  . Thrombocytopenia, unspecified 12/24/2012  . Aphthous stomatitis 12/24/2012  . Bleeding from nasopharynx 12/24/2012  . Cellulitis 12/23/2012  . Hemoptysis? Secondary to either irritation by dentures, vs. secondary to lung neoplasm 12/23/2012  . Rheumatoid arthritis-severe 12/23/2012  . COPD, mild ([place]) 12/23/2012  . Osteoporosis 12/23/2012   . Primary cancer of right upper lobe of lung ([place]) 03/21/2012    Past Medical History:  Diagnosis Date  . Cancer ([place])    lung ca  . COPD (chronic obstructive pulmonary disease) ([place])   . Hernia, umbilical   . Hip pain, right   . History of radiation therapy 05/02/12-05/04/12,&05/09/12   rul lung 54Gy/92fx  . HLD (hyperlipidemia)   . Lung mass   . Myocardial infarction ([place]) 08/2017  . Osteoporosis   . PAC (premature atrial contraction)   . RA (rheumatoid arthritis) ([place])   . Shingles     Family History  Problem Relation Age of Onset  . Cancer Daughter   . Cancer Mother    Past Surgical History:  Procedure Laterality Date  . LEFT HEART CATH AND CORONARY ANGIOGRAPHY N/A 08/26/2017   Procedure: LEFT HEART CATH AND CORONARY ANGIOGRAPHY;  Surgeon: Martinique, Peter M, MD;  Location: [facility];  Service: Cardiovascular;  Laterality: N/A;  . right lobectomy     Social History   Social History Narrative   Lives in ALF at [facility].  08/2017 resident of [facility].    01-19-18 Unable to ask abuse questions daughter with her today.    Objective: Vital Signs: There were no vitals taken for this visit.   Physical Exam   Musculoskeletal Exam: ***  CDAI Exam: CDAI Score: Not documented Patient Global Assessment: Not documented; Provider Global Assessment: Not documented Swollen: Not documented; Tender: Not documented Joint Exam   Not documented   There is currently no information documented on the homunculus. Go to the Rheumatology activity and complete the homunculus joint exam.  Investigation: No additional findings.  Imaging: No results found.  Recent Labs: Lab Results  Component Value Date  WBC 6.1 09/21/2017   HGB 9.2 (A) 09/21/2017   PLT 171 09/21/2017   NA 142 09/26/2017   K 3.9 09/26/2017   CL 99 (L) 08/26/2017   CO2 30 08/26/2017   GLUCOSE 90 08/26/2017   BUN 26 (H) 01/18/2018   CREATININE 1.10 (H) 01/18/2018   BILITOT  0.9 08/26/2017   ALKPHOS 39 08/26/2017   AST 20 08/26/2017   ALT 11 (L) 08/26/2017   PROT 5.3 (L) 08/26/2017   ALBUMIN 2.8 (L) 08/26/2017   CALCIUM 8.2 (L) 08/26/2017   GFRAA 51 (L) 01/18/2018    Speciality Comments: No specialty comments available.  Procedures:  No procedures performed Allergies: Latex; Boniva [ibandronic acid]; Darvocet [propoxyphene n-acetaminophen]; Erythromycin; Morphine and related; Oxycontin [oxycodone hcl]; Sulfa antibiotics; Tramadol; and Vicodin [hydrocodone-acetaminophen]   Assessment / Plan:     Visit Diagnoses: Rheumatoid arthritis of multiple sites with negative rheumatoid factor (HCC) - Severe disease with nodulosisPrednisone 5 mg daily.  She has severe arthritis with subluxation of all MCP joints.  Rheumatoid nodulosis (HCC)  High risk medication use -  Prednisone 5 mg p.o. daily she does not want to take SSZ or PLQ.   Primary osteoarthritis of both hands  Primary osteoarthritis of both feet  Age-related osteoporosis without current pathological fracture  Primary cancer of right upper lobe of lung (HCC)  Coronary artery calcification  NSTEMI (non-ST elevated myocardial infarction) ([place])  Neurocognitive disorder  History of COPD   Orders: No orders of the defined types were placed in this encounter.  No orders of the defined types were placed in this encounter.   Face-to-face time spent with patient was *** minutes. Greater than 50% of time was spent in counseling and coordination of care.  Follow-Up Instructions: No follow-ups on file.   Ofilia Neas, PA-C  Note - This record has been created using Dragon software.  Chart creation errors have been sought, but may not always  have been located. Such creation errors do not reflect on  the standard of medical care.

## 2018-03-27 ENCOUNTER — Encounter: Payer: Self-pay | Admitting: Podiatry

## 2018-03-27 NOTE — Progress Notes (Signed)
Subjective: [name] presents today with painful, thick toenails 1-5 b/l that she cannot cut and which interfere with daily activities.  Pain is aggravated when wearing enclosed shoe gear and relieved with periodic debridement.  Today, [name] is requesting medication for dry skin of her feet.   Medications    acetaminophen (TYLENOL) 325 MG tablet    carvedilol (COREG) 1.937 MG tablet    folic acid (FOLVITE) 1 MG tablet    furosemide (LASIX) 20 MG tablet    ipratropium-albuterol (DUONEB) 0.5-2.5 (3) MG/3ML SOLN    NUTRITIONAL SUPPLEMENT LIQD    predniSONE (DELTASONE) 5 MG tablet    predniSONE (DELTASONE) 5 MG tablet    sertraline (ZOLOFT) 100 MG tablet     Allergies  Allergen Reactions  . Latex Other (See Comments)    Blisters on her mouth  . Boniva [Ibandronic Acid]     Muscle aches  . Darvocet [Propoxyphene N-Acetaminophen] Nausea And Vomiting  . Erythromycin Nausea And Vomiting  . Morphine And Related Nausea And Vomiting  . Oxycontin [Oxycodone Hcl] Nausea And Vomiting  . Sulfa Antibiotics Nausea And Vomiting  . Tramadol Nausea And Vomiting  . Vicodin [Hydrocodone-Acetaminophen]     dizzy    Objective:  Vascular Examination: Capillary refill time immediate x 10 digits Dorsalis pedis and Posterior tibial pulses palpable b/l Digital hair present x 10 digits Skin temperature gradient WNL b/l  Dermatological Examination: Skin thin and atrophic b/l  Toenails 1-5 b/l discolored, thick, dystrophic with subungual debris and pain with palpation to nailbeds due to thickness of nails.  Plantar ball tylomas b/l. No erythema, no edema, no drainage or swelling noted b/l. No flocculence.  Musculoskeletal: Muscle strength 5/5 to all LE muscle groups  Foot deformity with severe RA; contracted digits 2-5 b/l and severe bunion deformity.  Neurological: Sensation intact with 10 gram monofilament. Vibratory sensation intact.  Assessment: Painful onychomycosis toenails  1-5 b/l   Plan: 1. Toenails 1-5 b/l were debrided in length and girth without iatrogenic bleeding. 2. Patient declined callus debridement on today. 3. Prescription sent to Rx for Aquaphor ointment to be applied once daily to both feet  4. Patient to continue soft, supportive shoe gear 5. Patient to report any pedal injuries to medical professional immediately. 6. Follow up 3 months. Patient/POA to call should there be a concern in the interim.

## 2018-03-29 ENCOUNTER — Ambulatory Visit: Payer: Medicare Other | Admitting: Physician Assistant

## 2018-04-11 ENCOUNTER — Other Ambulatory Visit: Payer: Self-pay | Admitting: Rheumatology

## 2018-04-13 ENCOUNTER — Telehealth: Payer: Self-pay | Admitting: Rheumatology

## 2018-04-13 NOTE — Telephone Encounter (Signed)
Left message to advise [facility] prescription has been sent to the pharmacy.

## 2018-04-13 NOTE — Telephone Encounter (Signed)
Last Visit: 10/11/17 Next Visit: 05/16/18  Okay to refill per Dr. Estanislado Pandy

## 2018-04-13 NOTE — Telephone Encounter (Signed)
Patient's daughter called requesting prescription refill of Prednisone to be sent to [facility] on [address].

## 2018-05-02 NOTE — Progress Notes (Signed)
Office Visit Note  Patient: [name]             Date of Birth: 03-23-1930           MRN: [id number]             PCP: Mayra Neer, MD Referring: Mayra Neer, MD Visit Date: 05/16/2018 Occupation: @GUAROCC @  Subjective:  Pain in multiple joints   History of Present Illness: [name] is a 85 y.o. female with history of seronegative rheumatoid arthritis and osteoarthritis.  She has pain in multiple joints including bilateral hands, bilateral wrists, bilateral knee joints, and bilateral ankle joints.  She states she has swelling in both wrist joints and both knee joints. She rates her current pain a 8/10. She states joint stiffness has been lasting all day. She uses a walker to assess her when walking.  She denies any recent falls. She would like to increase her dose of prednisone since she has been experiencing increased pain and stiffness.   Activities of Daily Living:  Patient reports morning stiffness all day.   Patient Reports nocturnal pain.  Difficulty dressing/grooming: Reports Difficulty climbing stairs: Reports Difficulty getting out of chair: Reports Difficulty using hands for taps, buttons, cutlery, and/or writing: Reports  Review of Systems  Constitutional: Negative for fatigue.  HENT: Negative for mouth sores and mouth dryness.   Eyes: Negative for pain, redness and dryness.  Respiratory: Negative for shortness of breath and wheezing.   Cardiovascular: Negative for chest pain and hypertension.  Gastrointestinal: Negative for abdominal pain, constipation and diarrhea.  Endocrine: Negative for increased urination.  Genitourinary: Negative for painful urination, nocturia and pelvic pain.  Musculoskeletal: Positive for arthralgias, joint pain, joint swelling and morning stiffness.  Skin: Negative for rash.  Allergic/Immunologic: Negative for susceptible to infections.  Neurological: Negative for dizziness, numbness, headaches and weakness.    Hematological: Positive for bruising/bleeding tendency.  Psychiatric/Behavioral: The patient is not nervous/anxious.     PMFS History:  Patient Active Problem List   Diagnosis Date Noted  . Abnormal CT of the chest 09/01/2017  . Adult failure to thrive 09/01/2017  . Neurocognitive disorder 08/30/2017  . NSTEMI (non-ST elevated myocardial infarction) ([place]) 08/25/2017  . 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 Primary osteoarthritis of both hands 06/09/2016  . Primary osteoarthritis of both feet 06/09/2016  . History of lung cancer 06/09/2016  . High risk medication use 06/09/2016  . Aortic calcification ([place]) 05/10/2013  . Coronary artery calcification 05/10/2013  . Protein-calorie malnutrition, severe ([place]) 12/25/2012  . Depression, recurrent ([place]) 12/25/2012  . Anemia of chronic disease 12/24/2012  . Thrombocytopenia, unspecified 12/24/2012  . Aphthous stomatitis 12/24/2012  . Bleeding from nasopharynx 12/24/2012  . Cellulitis 12/23/2012  . Hemoptysis? Secondary to either irritation by dentures, vs. secondary to lung neoplasm 12/23/2012  . Rheumatoid arthritis-severe 12/23/2012  . COPD, mild ([place]) 12/23/2012  . Osteoporosis 12/23/2012  . Primary cancer of right upper lobe of lung ([place]) 03/21/2012    Past Medical History:  Diagnosis Date  . Cancer ([place])    lung ca  . COPD (chronic obstructive pulmonary disease) ([place])   . Hernia, umbilical   . Hip pain, right   . History of radiation therapy 05/02/12-05/04/12,&05/09/12   rul lung 54Gy/75fx  . HLD (hyperlipidemia)   . Lung mass   . Myocardial infarction ([place]) 08/2017  . Osteoporosis   . PAC (premature atrial contraction)   . RA (rheumatoid arthritis) ([place])   . Shingles     Family History  Problem Relation Age of Onset  . Cancer Daughter   . Cancer Mother    Past Surgical History:  Procedure Laterality Date  . LEFT HEART CATH AND CORONARY ANGIOGRAPHY N/A 08/26/2017   Procedure: LEFT HEART CATH AND CORONARY  ANGIOGRAPHY;  Surgeon: Martinique, Peter M, MD;  Location: [facility];  Service: Cardiovascular;  Laterality: N/A;  . right lobectomy     Social History   Social History Narrative   Lives in ALF at [facility].  08/2017 resident of [facility].    01-19-18 Unable to ask abuse questions daughter with her today.   Immunization History  Administered Date(s) Administered  . Influenza Whole 02/09/2012  . Influenza-Unspecified 01/17/2017  . Pneumococcal-Unspecified 04/19/2014     Objective: Vital Signs: BP 114/60 (BP Location: Left Arm, Patient Position: Sitting, Cuff Size: Normal)   Resp 14   Ht 4\' 11"  (1.499 m)   Wt 93 lb (42.2 kg) Comment: per patient  BMI 18.78 kg/m    Physical Exam Vitals signs and nursing note reviewed.  Constitutional:      Appearance: She is well-developed.  HENT:     Head: Normocephalic and atraumatic.  Eyes:     Conjunctiva/sclera: Conjunctivae normal.  Neck:     Musculoskeletal: Normal range of motion.  Cardiovascular:     Rate and Rhythm: Normal rate and regular rhythm.     Heart sounds: Normal heart sounds.  Pulmonary:     Effort: Pulmonary effort is normal.     Breath sounds: Wheezing present.  Abdominal:     General: Bowel sounds are normal.     Palpations: Abdomen is soft.  Lymphadenopathy:     Cervical: No cervical adenopathy.  Skin:    General: Skin is warm and dry.     Capillary Refill: Capillary refill takes less than 2 seconds.  Neurological:     Mental Status: She is alert and oriented to person, place, and time.  Psychiatric:        Behavior: Behavior normal.      Musculoskeletal Exam: Limited ROM of C-spine.  Thoracic kyphosis.  Unable to assess lumbar spine ROM while in wheelchair. Shoulder joint abduction to about 90 degrees bilaterally.  Elbow joints good ROM with no tenderness or warmth.  Incomplete fist formation.  No ROM of wrist joints.  Synovial thickening and synovitis of both wrist joints.   Ulnar deviation bilaterally.  Synovial thickening and tenderness of all MCPs.  Hip joints difficult to assess while in wheelchair.  Left knee warmth and effusion.  Right knee mild warmth.  Tenderness of both ankle joints.  Pitting edema bilaterally.   CDAI Exam: CDAI Score: Not documented Patient Global Assessment: Not documented; Provider Global Assessment: Not documented Swollen: Not documented; Tender: Not documented Joint Exam   Not documented   There is currently no information documented on the homunculus. Go to the Rheumatology activity and complete the homunculus joint exam.  Investigation: No additional findings.  Imaging: No results found.  Recent Labs: Lab Results  Component Value Date   WBC 6.1 09/21/2017   HGB 9.2 (A) 09/21/2017   PLT 171 09/21/2017   NA 142 09/26/2017   K 3.9 09/26/2017   CL 99 (L) 08/26/2017   CO2 30 08/26/2017   GLUCOSE 90 08/26/2017   BUN 26 (H) 01/18/2018   CREATININE 1.10 (H) 01/18/2018   BILITOT 0.9 08/26/2017   ALKPHOS 39 08/26/2017   AST 20 08/26/2017   ALT 11 (L) 08/26/2017  PROT 5.3 (L) 08/26/2017   ALBUMIN 2.8 (L) 08/26/2017   CALCIUM 8.2 (L) 08/26/2017   GFRAA 51 (L) 01/18/2018    Speciality Comments: No specialty comments available.  Procedures:  No procedures performed Allergies: Latex; Boniva [ibandronic acid]; Darvocet [propoxyphene n-acetaminophen]; Erythromycin; Morphine and related; Oxycontin [oxycodone hcl]; Sulfa antibiotics; Tramadol; and Vicodin [hydrocodone-acetaminophen]   Assessment / Plan:     Visit Diagnoses: Rheumatoid arthritis of multiple sites with negative rheumatoid factor (HCC) - Severe disease with nodulosis: She continues to have active synovitis and synovial thickening.  She is currently taking Prednisone 5 mg po in the morning.  She has severe arthritis with subluxation of all MCP joints.  She has pain in both hands, both wrist joints, both knee joints, and both ankle joints.  She has chronic neck  pain and stiffness.  She would like to increase her dose of prednisone due to experiencing increased pain and stiffness in multiple joints. We discussed the risks of increasing the dose and long-term side effects of prednisone.  She agreed to increase to 6 mg po daily. She will follow up in 5 months.   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   High risk medication use - Prednisone 5 mg p.o. daily. She does not want to take SSZ or PLQ.  Primary osteoarthritis of both hands/rheumatoid arthritis of both hands: She has incomplete fist formation bilaterally.  She has active synovitis.   Primary osteoarthritis of both feet: Chronic pain. She is in a wheelchair today but typically uses a walker.  Age-related osteoporosis without current pathological fracture: She reports Dr. Brigitte Pulse is managing osteoporosis.  She reports she takes a calcium and vitamin D supplement.    Other medical conditions are listed as follows:   History of COPD  History of lung cancer   Orders: No orders of the defined types were placed in this encounter.  Meds ordered this encounter  Medications  . predniSONE (DELTASONE) 1 MG tablet    Sig: Take 1 tablet (1 mg total) by mouth daily with breakfast.    Dispense:  30 tablet    Refill:  0    Face-to-face time spent with patient was 30 minutes. Greater than 50% of time was spent in counseling and coordination of care.  Follow-Up Instructions: Return in about 5 months (around 10/15/2018) for Rheumatoid arthritis.   Ofilia Neas, PA-C   I examined and evaluated the patient with Hazel Sams PA.  Patient has severe end-stage rheumatoid arthritis.  She only wants prednisone for treatment.  As she has active synovitis we increase the dose of prednisone to 6 mg p.o. daily.  Side effect of prednisone were discussed again.  The plan of care was discussed as noted above.  Bo Merino, MD  Note - This record has been created using Editor, commissioning.  Chart creation  errors have been sought, but may not always  have been located. Such creation errors do not reflect on  the standard of medical care.

## 2018-05-14 ENCOUNTER — Other Ambulatory Visit: Payer: Self-pay | Admitting: Rheumatology

## 2018-05-15 NOTE — Telephone Encounter (Signed)
Last Visit: 10/11/17 Next Visit: 05/16/18  Okay to refill per Dr. Estanislado Pandy

## 2018-05-16 ENCOUNTER — Ambulatory Visit (INDEPENDENT_AMBULATORY_CARE_PROVIDER_SITE_OTHER): Payer: Medicare Other | Admitting: Rheumatology

## 2018-05-16 ENCOUNTER — Encounter: Payer: Self-pay | Admitting: Rheumatology

## 2018-05-16 VITALS — BP 114/60 | Resp 14 | Ht 59.0 in | Wt 93.0 lb

## 2018-05-16 DIAGNOSIS — M0630 Rheumatoid nodule, unspecified site: Secondary | ICD-10-CM | POA: Diagnosis not present

## 2018-05-16 DIAGNOSIS — M19072 Primary osteoarthritis, left ankle and foot: Secondary | ICD-10-CM

## 2018-05-16 DIAGNOSIS — M19071 Primary osteoarthritis, right ankle and foot: Secondary | ICD-10-CM

## 2018-05-16 DIAGNOSIS — Z85118 Personal history of other malignant neoplasm of bronchus and lung: Secondary | ICD-10-CM

## 2018-05-16 DIAGNOSIS — M19042 Primary osteoarthritis, left hand: Secondary | ICD-10-CM

## 2018-05-16 DIAGNOSIS — M19041 Primary osteoarthritis, right hand: Secondary | ICD-10-CM

## 2018-05-16 DIAGNOSIS — Z79899 Other long term (current) drug therapy: Secondary | ICD-10-CM | POA: Diagnosis not present

## 2018-05-16 DIAGNOSIS — Z8709 Personal history of other diseases of the respiratory system: Secondary | ICD-10-CM

## 2018-05-16 DIAGNOSIS — M0609 Rheumatoid arthritis without rheumatoid factor, multiple sites: Secondary | ICD-10-CM | POA: Diagnosis not present

## 2018-05-16 DIAGNOSIS — M810 Age-related osteoporosis without current pathological fracture: Secondary | ICD-10-CM

## 2018-05-16 MED ORDER — PREDNISONE 1 MG PO TABS: 1.0000 mg | ORAL_TABLET | Freq: Every day | ORAL | 0 refills | Status: DC

## 2018-05-30 ENCOUNTER — Encounter: Payer: Self-pay | Admitting: Pulmonary Disease

## 2018-05-30 ENCOUNTER — Ambulatory Visit (INDEPENDENT_AMBULATORY_CARE_PROVIDER_SITE_OTHER): Payer: Medicare Other | Admitting: Pulmonary Disease

## 2018-05-30 VITALS — BP 150/62 | HR 63 | Ht 59.0 in | Wt 95.8 lb

## 2018-05-30 DIAGNOSIS — J449 Chronic obstructive pulmonary disease, unspecified: Secondary | ICD-10-CM | POA: Diagnosis not present

## 2018-05-30 NOTE — Patient Instructions (Signed)
I am glad you are doing well with your breathing Continue the duo nebs Follow-up in 6 months.

## 2018-05-30 NOTE — Progress Notes (Signed)
[name]    [id number]    [birth year]  Primary Care Physician:Shaw, [name], MD  Referring Physician: Mayra Neer, MD 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  Follows with Dr. Estanislado Pandy for rheumatoid arthritis.  Previously treated with methotrexate but is currently on chronic prednisone. She has a history of COPD but no PFTs on record.  Hospitalized in May 19 for non-ST elevation MI, she underwent a cardiac cath which did not show significant obstructive coronary artery disease.  She is currently at a nursing home and is due to move to her own house this week.  His rehab stay has been complicated with acute on chronic respiratory failure thought to be secondary to COPD. Denies dyspnea on exertion, cough, sputum production, wheezing.   Interim history: Breathing is stable.  Continues on duo nebs twice daily She has occasional dyspnea with chest congestion.  No cough, sputum production  Outpatient Encounter Medications as of 05/30/2018  Medication Sig  . acetaminophen (TYLENOL) 325 MG tablet Take 650 mg by mouth every 8 (eight) hours as needed.  . carvedilol (COREG) 3.125 MG tablet Take 1 tablet (3.125 mg total) by mouth 2 (two) times daily with a meal.  . folic acid (FOLVITE) 1 MG tablet Take 1 tablet (1 mg total) by mouth daily.  . furosemide (LASIX) 20 MG tablet Take 1 tablet (20 mg total) by mouth daily as needed. For SOB or ankle edema  . ipratropium-albuterol (DUONEB) 0.5-2.5 (3) MG/3ML SOLN Take 3 mLs by nebulization every 4 (four) hours as needed (wheezing, shortness of breath). Dx: J44.9  . mineral oil-hydrophilic petrolatum (AQUAPHOR) ointment Apply topically as needed for dry skin.  Marland Kitchen NUTRITIONAL SUPPLEMENT LIQD Take 120 mLs by mouth 3 (three) times  daily. Med[employer]  . predniSONE (DELTASONE) 1 MG tablet Take 1 tablet (1 mg total) by mouth daily with breakfast.  . predniSONE (DELTASONE) 5 MG tablet TAKE 1 TABLET BY MOUTH DAILY WITH BREAKFAST  . sertraline (ZOLOFT) 100 MG tablet TK 1 T PO QD   No facility-administered encounter medications on file as of 05/30/2018.     Allergies as of 05/30/2018 - Review Complete 05/30/2018  Allergen Reaction Noted  . Latex Other (See Comments) 03/21/2012  . Boniva [ibandronic acid]  03/21/2012  . Darvocet [propoxyphene n-acetaminophen] Nausea And Vomiting 03/21/2012  . Erythromycin Nausea And Vomiting 03/21/2012  . Morphine and related Nausea And Vomiting 03/21/2012  . Oxycontin [oxycodone hcl] Nausea And Vomiting 03/21/2012  . Sulfa antibiotics Nausea And Vomiting 03/21/2012  . Tramadol Nausea And Vomiting 12/23/2012  . Vicodin [hydrocodone-acetaminophen]  03/21/2012    Past Medical History:  Diagnosis Date  . Cancer ([place])    lung ca  . COPD (chronic obstructive pulmonary disease) ([place])   . Hernia, umbilical   . Hip pain, right   . History of radiation therapy 05/02/12-05/04/12,&05/09/12   rul lung 54Gy/47fx  . HLD (hyperlipidemia)   . Lung mass   . Myocardial infarction ([place]) 08/2017  . Osteoporosis   . PAC (premature atrial contraction)   . RA (rheumatoid arthritis) ([place])   . Shingles     Past Surgical History:  Procedure Laterality Date  . LEFT HEART CATH AND CORONARY ANGIOGRAPHY N/A 08/26/2017   Procedure: LEFT HEART CATH AND CORONARY ANGIOGRAPHY;  Surgeon: Martinique,  Ander Slade, MD;  Location: [facility];  Service: Cardiovascular;  Laterality: N/A;  . right lobectomy      Family History  Problem Relation Age of Onset  . Cancer Daughter   . Cancer Mother     Social History   Socioeconomic History  . Marital status: Widowed    Spouse name: Not on file  . Number of children: Not on file  . Years of education: Not on file  . Highest education level: Not on file    Occupational History  . Occupation: retired    Fish farm manager: RETIRED  Social Needs  . Financial resource strain: Not on file  . Food insecurity:    Worry: Not on file    Inability: Not on file  . Transportation needs:    Medical: Not on file    Non-medical: Not on file  Tobacco Use  . Smoking status: Former Smoker    Types: Cigarettes    Last attempt to quit: 1995    Years since quitting: 25.1  . Smokeless tobacco: Never Used  Substance and Sexual Activity  . Alcohol use: No  . Drug use: Never  . Sexual activity: Not on file  Lifestyle  . Physical activity:    Days per week: Not on file    Minutes per session: Not on file  . Stress: Not on file  Relationships  . Social connections:    Talks on phone: Not on file    Gets together: Not on file    Attends religious service: Not on file    Active member of club or organization: Not on file    Attends meetings of clubs or organizations: Not on file    Relationship status: Not on file  . Intimate partner violence:    Fear of current or ex partner: Not on file    Emotionally abused: Not on file    Physically abused: Not on file    Forced sexual activity: Not on file  Other Topics Concern  . Not on file  Social History Narrative   Lives in ALF at [facility].  08/2017 resident of [facility].    01-19-18 Unable to ask abuse questions daughter with her today.    Review of systems: Review of Systems  Constitutional: Negative for fever and chills.  HENT: Negative.   Eyes: Negative for blurred vision.  Respiratory: as per HPI  Cardiovascular: Negative for chest pain and palpitations.  Gastrointestinal: Negative for vomiting, diarrhea, blood per rectum. Genitourinary: Negative for dysuria, urgency, frequency and hematuria.  Musculoskeletal: Negative for myalgias, back pain and joint pain.  Skin: Negative for itching and rash.  Neurological: Negative for dizziness, tremors, focal weakness, seizures and  loss of consciousness.  Endo/Heme/Allergies: Negative for environmental allergies.  Psychiatric/Behavioral: Negative for depression, suicidal ideas and hallucinations.  All other systems reviewed and are negative.  Physical Exam: Blood pressure (!) 150/62, pulse 63, height 4\' 11"  (1.499 m), weight 95 lb 12.8 oz (43.5 kg), SpO2 100 %. Gen:      No acute distress HEENT:  EOMI, sclera anicteric Neck:     No masses; no thyromegaly Lungs:    Clear to auscultation bilaterally; normal respiratory effort CV:         Regular rate and rhythm; no murmurs Abd:      + bowel sounds; soft, non-tender; no palpable masses, no distension Ext:    No edema; adequate peripheral perfusion Skin:      Warm and  dry; no rash Neuro: alert and oriented x 3 Psych: normal mood and affect  Data Reviewed: Imaging CT Angio 08/25/2017- cardiomegaly, coronary atherosclerosis.  Atelectasis, bronchiectasis in the right upper lobe.  Bibasilar scarring with nodular opacity in the left lower lobe stable compared to 2014.  I reviewed the images personally CT chest 01/18/2018- masslike archetectural distortion within the right lung is stable.  Stable left lower lobe pulmonary nodule.  PFTs 11/25/2017 FVC 1.32 [79%], FEV1 0.79 [66%], F/F 60, TLC 86%, DLCO 49% Moderate obstruction, severe diffusion defect.  Echo cardiogram 08/26/17 LVEF 65-70%, severe LVH, normal wall motion, grade 1 DD, indeterminate LV filling pressure, aortic valve sclerosis with trivial AI, trivial MR, normal LA size, normal IVC.  Assessment:  Moderate COPD PFTs reviewed with moderate obstructive airway disease.  She is currently on duo nebs which she is using twice daily Symptoms are well controlled so we will continue the same.  There is no need to escalate therapy at this point. We are avoiding inhalers given lack of dexterity due to rheumatoid arthritis.  Lung cancer status post XRT Imaging reviewed which shows consolidation, bronchiectasis in the  right upper lobe which could be post radiation changes.  In addition there are some nodular opacities that have remained stable.  We will continue to follow this  Plan/Recommendations: -Continue duo nebs  Marshell Garfinkel MD [facility] 05/30/2018, 3:32 PM  CC: Mayra Neer, MD

## 2018-06-09 ENCOUNTER — Other Ambulatory Visit: Payer: Self-pay | Admitting: Physician Assistant

## 2018-06-09 NOTE — Telephone Encounter (Signed)
Last Visit: 05/16/18 Next visit: 10/17/18  Okay to refill per Dr. Estanislado Pandy

## 2018-06-11 ENCOUNTER — Other Ambulatory Visit: Payer: Self-pay | Admitting: Rheumatology

## 2018-06-12 NOTE — Telephone Encounter (Signed)
Last Visit: 05/16/18 Next visit: 10/17/18  Okay to refill per Dr. Estanislado Pandy

## 2018-06-13 ENCOUNTER — Telehealth: Payer: Self-pay | Admitting: Rheumatology

## 2018-06-13 NOTE — Telephone Encounter (Signed)
Patient's daughter [name] called requesting we notify [facility] and let them know that her mom is now taking 6 mg Prednisone and that there will be 2 prescriptions (one for 1 mg and one for 5 mg).

## 2018-06-13 NOTE — Telephone Encounter (Signed)
Patient's daughter advised pharmacy aware of prednisone prescriptions.

## 2018-07-09 ENCOUNTER — Other Ambulatory Visit: Payer: Self-pay | Admitting: Rheumatology

## 2018-07-10 NOTE — Telephone Encounter (Signed)
Last Visit: 05/16/2018 Next Visit: 10/17/2018  Okay to refill per Dr. Estanislado Pandy.

## 2018-07-11 ENCOUNTER — Ambulatory Visit: Payer: Medicare Other | Admitting: Podiatry
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
## 2018-10-09 ENCOUNTER — Telehealth: Payer: Self-pay | Admitting: Rheumatology

## 2018-10-09 NOTE — Telephone Encounter (Signed)
Patient's daughter [name] called stating she is due for eye surgery and will be unable to drive her mom to her appointment on 10/17/18.  [name] states she was able to get transportation for her mom on Monday, 10/16/18 and is checking if Dr. Estanislado Pandy would be able to see her mom at 1:00 pm.  [name] states her mom is not able to make morning appointments.  Please advise.
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
## 2018-10-10 NOTE — Telephone Encounter (Signed)
Please schedule virtual visit for the patient.

## 2018-10-10 NOTE — Telephone Encounter (Signed)
Patient coming for an appointment on 10/11/18 at 2:20 pm.

## 2018-10-11 ENCOUNTER — Encounter: Payer: Self-pay | Admitting: Physician Assistant

## 2018-10-11 ENCOUNTER — Ambulatory Visit (INDEPENDENT_AMBULATORY_CARE_PROVIDER_SITE_OTHER): Payer: Medicare Other | Admitting: Physician Assistant

## 2018-10-11 ENCOUNTER — Other Ambulatory Visit: Payer: Self-pay

## 2018-10-11 VITALS — BP 163/64 | HR 64 | Resp 12 | Ht 59.0 in | Wt 97.2 lb

## 2018-10-11 DIAGNOSIS — M19041 Primary osteoarthritis, right hand: Secondary | ICD-10-CM | POA: Diagnosis not present

## 2018-10-11 DIAGNOSIS — M19042 Primary osteoarthritis, left hand: Secondary | ICD-10-CM

## 2018-10-11 DIAGNOSIS — Z85118 Personal history of other malignant neoplasm of bronchus and lung: Secondary | ICD-10-CM

## 2018-10-11 DIAGNOSIS — M19071 Primary osteoarthritis, right ankle and foot: Secondary | ICD-10-CM

## 2018-10-11 DIAGNOSIS — M810 Age-related osteoporosis without current pathological fracture: Secondary | ICD-10-CM

## 2018-10-11 DIAGNOSIS — Z79899 Other long term (current) drug therapy: Secondary | ICD-10-CM | POA: Diagnosis not present

## 2018-10-11 DIAGNOSIS — M19072 Primary osteoarthritis, left ankle and foot: Secondary | ICD-10-CM

## 2018-10-11 DIAGNOSIS — M0609 Rheumatoid arthritis without rheumatoid factor, multiple sites: Secondary | ICD-10-CM | POA: Diagnosis not present

## 2018-10-11 DIAGNOSIS — Z8709 Personal history of other diseases of the respiratory system: Secondary | ICD-10-CM

## 2018-10-11 DIAGNOSIS — M0630 Rheumatoid nodule, unspecified site: Secondary | ICD-10-CM | POA: Diagnosis not present

## 2018-10-11 MED ORDER — PREDNISONE 5 MG PO TABS: 5.0000 mg | ORAL_TABLET | Freq: Every day | ORAL | 0 refills | Status: DC

## 2018-10-17 ENCOUNTER — Ambulatory Visit: Payer: Self-pay | Admitting: Rheumatology

## 2018-10-30 ENCOUNTER — Ambulatory Visit (INDEPENDENT_AMBULATORY_CARE_PROVIDER_SITE_OTHER): Payer: Medicare Other | Admitting: Podiatry

## 2018-10-30 ENCOUNTER — Encounter: Payer: Self-pay | Admitting: Podiatry

## 2018-10-30 ENCOUNTER — Other Ambulatory Visit: Payer: Self-pay

## 2018-10-30 DIAGNOSIS — B351 Tinea unguium: Secondary | ICD-10-CM

## 2018-10-30 DIAGNOSIS — M79674 Pain in right toe(s): Secondary | ICD-10-CM

## 2018-10-30 DIAGNOSIS — Q828 Other specified congenital malformations of skin: Secondary | ICD-10-CM

## 2018-10-30 DIAGNOSIS — M79675 Pain in left toe(s): Secondary | ICD-10-CM | POA: Diagnosis not present

## 2018-10-30 DIAGNOSIS — M0579 Rheumatoid arthritis with rheumatoid factor of multiple sites without organ or systems involvement: Secondary | ICD-10-CM

## 2018-10-30 NOTE — Patient Instructions (Signed)

## 2018-10-30 NOTE — Progress Notes (Signed)
Subjective: [name] presents to clinic with h/o RA with cc of painful mycotic toenails and plantar callus left foot which are aggravated when weightbearing with and without shoe gear.  This pain limits her daily activities. Pain symptoms resolve with periodic professional debridement.  Today, she is accompanied by her daughter's friend as her daughter is having cataract surgery on this morning.  Emily Neer, MD is her PCP.    Current Outpatient Medications:  .  acetaminophen (TYLENOL) 325 MG tablet, Take 650 mg by mouth every 8 (eight) hours as needed., Disp: , Rfl:  .  carvedilol (COREG) 3.125 MG tablet, Take 1 tablet (3.125 mg total) by mouth 2 (two) times daily with a meal., Disp: 60 tablet, Rfl: 0 .  ipratropium-albuterol (DUONEB) 0.5-2.5 (3) MG/3ML SOLN, Take 3 mLs by nebulization every 4 (four) hours as needed (wheezing, shortness of breath). Dx: J44.9, Disp: 360 mL, Rfl: 5 .  Multiple Vitamins-Minerals (ALIVE ONCE DAILY WOMENS) TABS, Take 1 tablet by mouth daily., Disp: , Rfl:  .  predniSONE (DELTASONE) 5 MG tablet, Take 1 tablet (5 mg total) by mouth daily with breakfast., Disp: 90 tablet, Rfl: 0   Allergies  Allergen Reactions  . Latex Other (See Comments)    Blisters on her mouth  . Boniva [Ibandronic Acid]     Muscle aches  . Darvocet [Propoxyphene N-Acetaminophen] Nausea And Vomiting  . Erythromycin Nausea And Vomiting  . Morphine And Related Nausea And Vomiting  . Oxycontin [Oxycodone Hcl] Nausea And Vomiting  . Sulfa Antibiotics Nausea And Vomiting  . Tramadol Nausea And Vomiting  . Vicodin [Hydrocodone-Acetaminophen]     dizzy     Objective: Physical Examination:  Vascular  Examination: Capillary refill time immediate x 10 digits.  Palpable DP/PT pulses b/l.  Digital hair sparse b/l.  No edema noted b/l.  Skin temperature gradient WNL b/l.  Dermatological Examination: Pedal skin is thin, shiny and atrophic b/l.  No open wounds b/l.  No  interdigital macerations noted b/l.  Elongated, thick, discolored brittle toenails with subungual debris and pain on dorsal palpation of nailbeds 1-5 b/l.  Porokeratotic lesions submet head 2 left foot with tenderness to palpation. There is subdermal hemorrhage present, but no underlying wound. No erythema, no edema, no drainage, no flocculence.  Musculoskeletal Examination: Muscle strength 5/5 to all muscle groups b/l.  Hammertoes 2-5 b/l.  HAV with bunion b/l.  Neurological Examination: Sensation intact 5/5 b/l with 10 gram monofilament.  Vibratory sensation intact b/l.  Proprioceptive sensation intact b/l.  Assessment: 1. Mycotic nail infection with pain 1-5 b/l 2. Porokeratotic lesion submet head 2 left foot  Plan: 1. Toenails 1-5 b/l were debrided in length and girth without iatrogenic laceration. Porokeratosis submet head 2 left foot pared and enucleated with sterile scalpel blade without incident. Instructed caregiver to apply antibiotic ointment to area once daily for 2 weeks. Continue soft, supportive shoe gear daily. Report any pedal injuries to medical professional. Follow up 3 months. Patient/POA to call should there be a question/concern in there interim.

## 2018-11-28 ENCOUNTER — Ambulatory Visit (INDEPENDENT_AMBULATORY_CARE_PROVIDER_SITE_OTHER): Payer: Medicare Other | Admitting: Pulmonary Disease

## 2018-11-28 ENCOUNTER — Encounter: Payer: Self-pay | Admitting: Pulmonary Disease

## 2018-11-28 ENCOUNTER — Other Ambulatory Visit: Payer: Self-pay

## 2018-11-28 VITALS — BP 120/64 | HR 56 | Temp 98.0°F | Ht 59.0 in | Wt 97.2 lb

## 2018-11-28 DIAGNOSIS — J449 Chronic obstructive pulmonary disease, unspecified: Secondary | ICD-10-CM | POA: Diagnosis not present

## 2018-11-28 NOTE — Patient Instructions (Signed)
Continue using your nebulizers as prescribed We will order a follow-up CT of the chest without contrast in 3 months time We will start a prescription called ramelteon 8 mg at night for insomnia Follow-up in 3 months.

## 2018-11-28 NOTE — Progress Notes (Signed)
[name]    [id number]    1929-08-28  Primary Care Physician:Shaw, [name], MD  Referring Physician: Mayra Neer, MD 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  Follows with Dr. Estanislado Pandy for rheumatoid arthritis.  Previously treated with methotrexate but is currently on chronic prednisone.  She has a history of COPD but no PFTs on record.  Hospitalized in May 19 for non-ST elevation MI, she underwent a cardiac cath which did not show significant obstructive coronary artery disease. Denies dyspnea on exertion, cough, sputum production, wheezing.   Interim history: No new issues.  She is on duo nebs which he uses occasionally as needed States that breathing is doing well Continues on low-dose prednisone for rheumatoid arthritis.  Chief complaint is insomnia and she wants something mild for this  Outpatient Encounter Medications as of 11/28/2018  Medication Sig  . acetaminophen (TYLENOL) 325 MG tablet Take 650 mg by mouth every 8 (eight) hours as needed.  . carvedilol (COREG) 3.125 MG tablet Take 1 tablet (3.125 mg total) by mouth 2 (two) times daily with a meal.  . ipratropium-albuterol (DUONEB) 0.5-2.5 (3) MG/3ML SOLN Take 3 mLs by nebulization every 4 (four) hours as needed (wheezing, shortness of breath). Dx: J44.9  . Multiple Vitamins-Minerals (ALIVE ONCE DAILY WOMENS) TABS Take 1 tablet by mouth daily.  . predniSONE (DELTASONE) 5 MG tablet Take 1 tablet (5 mg total) by mouth daily with breakfast.   No facility-administered encounter medications on file as of 11/28/2018.    Physical Exam: Blood pressure 120/64, pulse (!) 56, temperature 98 F (36.7 C), temperature source Oral, height 4\' 11"  (1.499 m), weight 97 lb 3.2 oz (44.1 kg), SpO2 96 %. Gen:      No  acute distress HEENT:  EOMI, sclera anicteric Neck:     No masses; no thyromegaly Lungs:    Clear to auscultation bilaterally; normal respiratory effort CV:         Regular rate and rhythm; no murmurs Abd:      + bowel sounds; soft, non-tender; no palpable masses, no distension Ext:    No edema; adequate peripheral perfusion Skin:      Warm and dry; no rash Neuro: alert and oriented x 3 Psych: normal mood and affect  Data Reviewed: Imaging CT Angio 08/25/2017- cardiomegaly, coronary atherosclerosis.  Atelectasis, bronchiectasis in the right upper lobe.  Bibasilar scarring with nodular opacity in the left lower lobe stable compared to 2014.  I reviewed the images personally CT chest 01/18/2018- masslike archetectural distortion within the right lung is stable.  Stable left lower lobe pulmonary nodule.  PFTs 11/25/2017 FVC 1.32 [79%], FEV1 0.79 [66%], F/F 60, TLC 86%, DLCO 49% Moderate obstruction, severe diffusion defect.  Echo cardiogram 08/26/17 LVEF 65-70%, severe LVH, normal wall motion, grade 1 DD, indeterminate LV filling pressure, aortic valve sclerosis with trivial AI, trivial MR, normal LA size, normal IVC.  Assessment:  Moderate COPD PFTs reviewed with moderate obstructive airway disease.  She is currently on duo nebs which she is using intermittently Symptoms are well controlled so we will continue the same.  There is no need to escalate therapy at this point. We are avoiding inhalers given lack of dexterity due to rheumatoid arthritis.  Lung cancer status post XRT Imaging reviewed  which shows consolidation, bronchiectasis in the right upper lobe which could be post radiation changes.  In addition there are some nodular opacities that have remained stable.  We will continue to follow this  Insomnia [place] 8mg  at night  Plan/Recommendations: - Continue duo nebs - Follow up CT in 3 months - [place] 8 mg qhs  Marshell Garfinkel MD [facility]  11/28/2018, 12:04 PM  CC: Mayra Neer, MD

## 2018-12-01 ENCOUNTER — Telehealth: Payer: Self-pay | Admitting: Pulmonary Disease

## 2018-12-01 MED ORDER — RAMELTEON 8 MG PO TABS: 8.0000 mg | ORAL_TABLET | Freq: Every day | ORAL | 0 refills | Status: DC

## 2018-12-01 NOTE — Telephone Encounter (Signed)
Instructions from OV with Dr. Vaughan Browner 11/28/2018  Continue using your nebulizers as prescribed We will order a follow-up CT of the chest without contrast in 3 months time We will start a prescription called ramelteon 8 mg at night for insomnia Follow-up in 3 months.     Checked pt's med list and it does not look like the ramelteon was called in to pharmacy after OV. Dr. Vaughan Browner please advise on this. Preferred pharmacy med needs to be sent to is [facility] on [address].

## 2018-12-01 NOTE — Telephone Encounter (Signed)
Call made to [facility]), made aware prescription has been sent in. Voiced understanding. Nothing further is needed at this time.

## 2018-12-01 NOTE — Telephone Encounter (Signed)
Please call in [place] 8 mg qhs

## 2019-01-03 ENCOUNTER — Telehealth: Payer: Self-pay | Admitting: *Deleted

## 2019-01-03 NOTE — Telephone Encounter (Signed)
Returned patient's daughter phone call, spoke with patient's daughterLenn [name]

## 2019-01-19 ENCOUNTER — Telehealth: Payer: Self-pay | Admitting: Pulmonary Disease

## 2019-01-19 NOTE — Telephone Encounter (Signed)
I have left a message for [name] she has called back and I have sent her to Erline Levine to get her set up

## 2019-01-22 ENCOUNTER — Other Ambulatory Visit: Payer: Self-pay | Admitting: Physician Assistant

## 2019-01-22 NOTE — Telephone Encounter (Signed)
Last visit: 10/11/18 Next Visit: 02/20/19  Okay to refill per Dr. Estanislado Pandy

## 2019-01-31 ENCOUNTER — Ambulatory Visit (INDEPENDENT_AMBULATORY_CARE_PROVIDER_SITE_OTHER): Payer: Medicare Other | Admitting: Podiatry

## 2019-01-31 ENCOUNTER — Encounter: Payer: Self-pay | Admitting: Podiatry

## 2019-01-31 ENCOUNTER — Other Ambulatory Visit: Payer: Self-pay

## 2019-01-31 DIAGNOSIS — M79674 Pain in right toe(s): Secondary | ICD-10-CM | POA: Diagnosis not present

## 2019-01-31 DIAGNOSIS — B351 Tinea unguium: Secondary | ICD-10-CM | POA: Diagnosis not present

## 2019-01-31 DIAGNOSIS — M79675 Pain in left toe(s): Secondary | ICD-10-CM

## 2019-01-31 DIAGNOSIS — Q828 Other specified congenital malformations of skin: Secondary | ICD-10-CM | POA: Diagnosis not present

## 2019-01-31 NOTE — Patient Instructions (Addendum)
[email or web address]: Extra thick oval callus pads 1/4" thick felt, 100 felt pads   Corns and Calluses Corns are small areas of thickened skin that occur on the top, sides, or tip of a toe. They contain a cone-shaped core with a point that can press on a nerve below. This causes pain.  Calluses are areas of thickened skin that can occur anywhere on the body, including the hands, fingers, palms, soles of the feet, and heels. Calluses are usually larger than corns. What are the causes? Corns and calluses are caused by rubbing (friction) or pressure, such as from shoes that are too tight or do not fit properly. What increases the risk? Corns are more likely to develop in people who have misshapen toes (toe deformities), such as hammer toes. Calluses can occur with friction to any area of the skin. They are more likely to develop in people who:  Work with their hands.  Wear shoes that fit poorly, are too tight, or are high-heeled.  Have toe deformities. What are the signs or symptoms? Symptoms of a corn or callus include:  A hard growth on the skin.  Pain or tenderness under the skin.  Redness and swelling.  Increased discomfort while wearing tight-fitting shoes, if your feet are affected. If a corn or callus becomes infected, symptoms may include:  Redness and swelling that gets worse.  Pain.  Fluid, blood, or pus draining from the corn or callus. How is this diagnosed? Corns and calluses may be diagnosed based on your symptoms, your medical history, and a physical exam. How is this treated? Treatment for corns and calluses may include:  Removing the cause of the friction or pressure. This may involve: ? Changing your shoes. ? Wearing shoe inserts (orthotics) or other protective layers in your shoes, such as a corn pad. ? Wearing gloves.  Applying medicine to the skin (topical medicine) to help soften skin in the hardened, thickened areas.  Removing layers of dead skin with a file  to reduce the size of the corn or callus.  Removing the corn or callus with a scalpel or laser.  Taking antibiotic medicines, if your corn or callus is infected.  Having surgery, if a toe deformity is the cause. Follow these instructions at home:   Take over-the-counter and prescription medicines only as told by your health care provider.  If you were prescribed an antibiotic, take it as told by your health care provider. Do not stop taking it even if your condition starts to improve.  Wear shoes that fit well. Avoid wearing high-heeled shoes and shoes that are too tight or too loose.  Wear any padding, protective layers, gloves, or orthotics as told by your health care provider.  Soak your hands or feet and then use a file or pumice stone to soften your corn or callus. Do this as told by your health care provider.  Check your corn or callus every day for symptoms of infection. Contact a health care provider if you:  Notice that your symptoms do not improve with treatment.  Have redness or swelling that gets worse.  Notice that your corn or callus becomes painful.  Have fluid, blood, or pus coming from your corn or callus.  Have new symptoms. Summary  Corns are small areas of thickened skin that occur on the top, sides, or tip of a toe.  Calluses are areas of thickened skin that can occur anywhere on the body, including the hands, fingers, palms, and soles of the  feet. Calluses are usually larger than corns.  Corns and calluses are caused by rubbing (friction) or pressure, such as from shoes that are too tight or do not fit properly.  Treatment may include wearing any padding, protective layers, gloves, or orthotics as told by your health care provider. This information is not intended to replace advice given to you by your health care provider. Make sure you discuss any questions you have with your health care provider. Document Released: 01/10/2004 Document Revised:  07/26/2018 Document Reviewed: 02/16/2017 Elsevier Patient Education  2020 [employer].

## 2019-02-01 ENCOUNTER — Telehealth: Payer: Self-pay

## 2019-02-01 DIAGNOSIS — C3490 Malignant neoplasm of unspecified part of unspecified bronchus or lung: Secondary | ICD-10-CM

## 2019-02-01 NOTE — Telephone Encounter (Signed)
Called and spoke to pt's daughter, [name]. She states the pt is needing a routine CT chest with contrast requested by the cancer center. Pt states she has these at least once a year and insurance will not cover a CT chest with contrast and a separate one without contrast (requested by Dr. Vaughan Browner). Pt already has a CT chest with contrast ordered in Epic under [name], [place].   Dr. Vaughan Browner please advise if pt's scan can be cancelled and she can just have the CT WITH contrast or if we can change order to CT chest with and without. Thanks.

## 2019-02-02 NOTE — Telephone Encounter (Signed)
If she already has a CT with contrast ordered then we can cancel our test and she can proceed with the contrast CT

## 2019-02-02 NOTE — Telephone Encounter (Signed)
LMTCB x1 for pt's daughter.

## 2019-02-05 NOTE — Progress Notes (Signed)
Subjective: [name] presents to clinic for painful plantar calluses which are aggravated when weightbearing with and without shoe gear.  This pain limits her daily activities. She has rigid digital deformities b/l secondary to rheumatoid arthritis. Pain symptoms resolve with periodic professional debridement.  Her daughter is present during the visit.  Current Outpatient Medications on File Prior to Visit  Medication Sig Dispense Refill  . acetaminophen (TYLENOL) 325 MG tablet Take 650 mg by mouth every 8 (eight) hours as needed.    . carvedilol (COREG) 3.125 MG tablet Take 1 tablet (3.125 mg total) by mouth 2 (two) times daily with a meal. 60 tablet 0  . ipratropium-albuterol (DUONEB) 0.5-2.5 (3) MG/3ML SOLN Take 3 mLs by nebulization every 4 (four) hours as needed (wheezing, shortness of breath). Dx: J44.9 360 mL 5  . Multiple Vitamins-Minerals (ALIVE ONCE DAILY WOMENS) TABS Take 1 tablet by mouth daily.    . predniSONE (DELTASONE) 5 MG tablet TAKE 1 TABLET(5 MG) BY MOUTH DAILY WITH BREAKFAST 90 tablet 0  . ramelteon (ROZEREM) 8 MG tablet Take 1 tablet (8 mg total) by mouth at bedtime. 30 tablet 0   No current facility-administered medications on file prior to visit.      Allergies  Allergen Reactions  . Latex Other (See Comments)    Blisters on her mouth  . Boniva [Ibandronic Acid]     Muscle aches  . Darvocet [Propoxyphene N-Acetaminophen] Nausea And Vomiting  . Erythromycin Nausea And Vomiting  . Morphine And Related Nausea And Vomiting  . Oxycontin [Oxycodone Hcl] Nausea And Vomiting  . Sulfa Antibiotics Nausea And Vomiting  . Tramadol Nausea And Vomiting  . Vicodin [Hydrocodone-Acetaminophen]     dizzy     Objective: There were no vitals filed for this visit.  Physical Examination:  Vascular  Examination: Capillary refill time immediate x 10 digits.  Palpable DP/PT pulses b/l.  Digital hair sparse b/l.  No edema noted b/l.  Skin temperature gradient WNL  b/l.  Dermatological Examination: Pedal skin is thin, shiny and atrophic b/l.   No open wounds b/l.  No interdigital macerations noted b/l.  Elongated, thick, discolored brittle toenails with subungual debris and pain on dorsal palpation of nailbeds 1-5 b/l.  Porokeratotic lesions submet head 2 left foot with subdermal hemorrhage  with tenderness to palpation. No erythema, no edema, no drainage, no flocculence.   Hyperkeratotic lesion submet head 5 left foot with tenderness to palpation. No edema, no erythema, no drainage, no flocculence.  Musculoskeletal Examination: Muscle strength 5/5 to all muscle groups b/l.  HAV with bunion b/l. Rigid hammertoes 2-5 b/l.   No pain, crepitus or joint discomfort with active/passive ROM.  Neurological Examination: Sensation intact 5/5 b/l with 10 gram monofilament.  Vibratory sensation intact b/l.  Assessment: 1. Mycotic nail infection with pain 1-5 b/l 2. Porokeratotic lesion submet head 2 left foot 3. Callus submet head 5 left foot  Plan: 1. Toenails 1-5 b/l were debrided in length and girth without iatrogenic laceration. 2. Porokeratosis submet head 2 left foot pared and enucleated with sterile scalpel blade without incident. Triple antibiotic ointment and protective callus pad applied. Painful hyperkeratotic lesion pared submet head 5 left. 3. Continue soft, supportive shoe gear daily. 4. Report any pedal injuries to medical professional. 4. Follow up 3 months. 5. Patient/POA to call should there be a question/concern in there interim.

## 2019-02-06 NOTE — Telephone Encounter (Signed)
Called and spoke w/ pt daughter, [name] (on [place]) regarding Dr. Matilde Bash response. [name] verbalized understanding; however, is inquiring if the contrast dye can be added to the CT scan wo contrast scheduled for 02/21/2019. I let her know typically that would likely require a new order. She states when she spoke to the radiology dept with pt's onocologist that they advised her to call our office and see if contrast dye could be added. She further notes the CT with contrast could not be scheduled because insurance would not cover two CTs. I let her know I would get a message to Dr. Vaughan Browner to see if he is ok with changing the order to CT chest with contrast per he request.   Dr. Vaughan Browner, please advise if you are ok with [place] changing the order to CT chest with contrast. Pt daughter is aware a new order, recent bloodwork, and scheduling would have to take place.

## 2019-02-07 NOTE — Telephone Encounter (Signed)
Yes.  Okay to change order to CT chest with contrast

## 2019-02-07 NOTE — Telephone Encounter (Signed)
I spoke to [place] at [place].  She has changed CT to with contrast. Pt's dtr already aware order being changed and labs needed.  Nothing further needed.

## 2019-02-07 NOTE — Telephone Encounter (Signed)
Called and spoke w/ pt daughter (on [place]) regarding Dr. Matilde Bash approval to change the order to CT chest with contrast. Pt daughter is aware pt will have to have BMET labwork done beforehand to check the function of pt's kidneys. I have placed the order for BMET and CT Chest with Contrast.   Routing this message to [facility] for f/u. PCCs, pt has a CT wo contrast already scheduled for 02/21/2019. Please cancel this and schedule pt for CT chest with contrast at the next available. Thank you.

## 2019-02-08 ENCOUNTER — Telehealth: Payer: Self-pay | Admitting: Pulmonary Disease

## 2019-02-08 NOTE — Telephone Encounter (Signed)
Spoke with Barney Drain, pt's daughter and notified to come here to out office to do the labs  Office location provided  Nothing further needed

## 2019-02-09 ENCOUNTER — Other Ambulatory Visit (INDEPENDENT_AMBULATORY_CARE_PROVIDER_SITE_OTHER): Payer: Medicare Other

## 2019-02-09 DIAGNOSIS — C3490 Malignant neoplasm of unspecified part of unspecified bronchus or lung: Secondary | ICD-10-CM

## 2019-02-09 LAB — BASIC METABOLIC PANEL
BUN: 33 mg/dL — ABNORMAL HIGH (ref 6–23)
CO2: 26 mEq/L (ref 19–32)
Calcium: 9.1 mg/dL (ref 8.4–10.5)
Chloride: 106 mEq/L (ref 96–112)
Creatinine, Ser: 1.07 mg/dL (ref 0.40–1.20)
GFR: 48.5 mL/min — ABNORMAL LOW (ref >60.00)
Glucose, Bld: 119 mg/dL — ABNORMAL HIGH (ref 70–99)
Potassium: 4.5 mEq/L (ref 3.5–5.1)
Sodium: 139 mEq/L (ref 135–145)

## 2019-02-20 ENCOUNTER — Encounter: Payer: Self-pay | Admitting: Rheumatology

## 2019-02-20 ENCOUNTER — Other Ambulatory Visit: Payer: Self-pay

## 2019-02-20 ENCOUNTER — Ambulatory Visit (INDEPENDENT_AMBULATORY_CARE_PROVIDER_SITE_OTHER): Payer: Medicare Other | Admitting: Rheumatology

## 2019-02-20 VITALS — BP 160/60 | HR 64 | Resp 16 | Ht 59.0 in | Wt 96.2 lb

## 2019-02-20 DIAGNOSIS — M19042 Primary osteoarthritis, left hand: Secondary | ICD-10-CM

## 2019-02-20 DIAGNOSIS — M0609 Rheumatoid arthritis without rheumatoid factor, multiple sites: Secondary | ICD-10-CM | POA: Diagnosis not present

## 2019-02-20 DIAGNOSIS — M19041 Primary osteoarthritis, right hand: Secondary | ICD-10-CM | POA: Diagnosis not present

## 2019-02-20 DIAGNOSIS — M19071 Primary osteoarthritis, right ankle and foot: Secondary | ICD-10-CM

## 2019-02-20 DIAGNOSIS — Z79899 Other long term (current) drug therapy: Secondary | ICD-10-CM | POA: Diagnosis not present

## 2019-02-20 DIAGNOSIS — M0630 Rheumatoid nodule, unspecified site: Secondary | ICD-10-CM | POA: Diagnosis not present

## 2019-02-20 DIAGNOSIS — Z85118 Personal history of other malignant neoplasm of bronchus and lung: Secondary | ICD-10-CM

## 2019-02-20 DIAGNOSIS — Z8709 Personal history of other diseases of the respiratory system: Secondary | ICD-10-CM

## 2019-02-20 DIAGNOSIS — M19072 Primary osteoarthritis, left ankle and foot: Secondary | ICD-10-CM

## 2019-02-20 DIAGNOSIS — M810 Age-related osteoporosis without current pathological fracture: Secondary | ICD-10-CM

## 2019-02-20 NOTE — Progress Notes (Signed)
Office Visit Note  Patient: [name]             Date of Birth: 1929/07/19           MRN: [id number]             PCP: Mayra Neer, MD Referring: Mayra Neer, MD Visit Date: 02/20/2019 Occupation: @GUAROCC @  Subjective:  Medication monitoring   History of Present Illness: [name] is a 85 y.o. female with history of seronegative rheumatoid arthritis, osteoarthritis, and osteoporosis.  She is taking prednisone 5 mg po daily. She denies any recent rheumatoid arthritis flares.  She denies any joint pain or joint swelling while taking prednisone.  She does not want to take any medications for osteoporosis at this time.    Activities of Daily Living:  Patient reports morning stiffness for  0  minutes.   Patient Denies nocturnal pain.  Difficulty dressing/grooming: Denies Difficulty climbing stairs: Denies Difficulty getting out of chair: Denies Difficulty using hands for taps, buttons, cutlery, and/or writing: Denies  Review of Systems  Constitutional: Negative for fatigue.  HENT: Negative for mouth sores, mouth dryness and nose dryness.   Eyes: Negative for pain, visual disturbance and dryness.  Respiratory: Negative for cough, hemoptysis, shortness of breath and difficulty breathing.   Cardiovascular: Positive for swelling in legs/feet. Negative for chest pain, palpitations and hypertension.  Gastrointestinal: Negative for blood in stool, constipation and diarrhea.  Endocrine: Negative for increased urination.  Genitourinary: Negative for painful urination.  Musculoskeletal: Negative for arthralgias, joint pain, joint swelling, myalgias, muscle weakness, morning stiffness, muscle tenderness and myalgias.  Skin: Negative for color change, pallor, rash, hair loss, nodules/bumps, skin tightness, ulcers and sensitivity to sunlight.  Allergic/Immunologic: Negative for susceptible to infections.  Neurological: Negative for dizziness, numbness, headaches and weakness.    Hematological: Negative for swollen glands.  Psychiatric/Behavioral: Negative for depressed mood and sleep disturbance. The patient is not nervous/anxious.     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 Secondary to either irritation by dentures, vs. secondary to lung neoplasm 12/23/2012   Rheumatoid arthritis-severe 12/23/2012   COPD, mild ([place]) 12/23/2012   Osteoporosis 12/23/2012   Primary cancer of right upper lobe of lung ([place]) 03/21/2012    Past Medical History:  Diagnosis Date   Cancer ([place])    lung ca   COPD (chronic obstructive pulmonary disease) ([place])    Hernia, umbilical    Hip pain, right    History of radiation therapy 05/02/12-05/04/12,&05/09/12   rul lung 54Gy/31fx   HLD (hyperlipidemia)    Lung mass    Myocardial infarction ([place]) 08/2017   Osteoporosis    PAC (premature atrial contraction)    RA (rheumatoid arthritis) ([place])    Shingles     Family History  Problem  Relation Age of Onset   Cancer Daughter    Cancer Mother    Past Surgical History:  Procedure Laterality Date   LEFT HEART CATH AND CORONARY ANGIOGRAPHY N/A 08/26/2017   Procedure:  LEFT HEART CATH AND CORONARY ANGIOGRAPHY;  Surgeon: Martinique, Peter M, MD;  Location: [facility];  Service: Cardiovascular;  Laterality: N/A;   right lobectomy     Social History   Social History Narrative   Lives in ALF at [facility]  08/2017 resident of [facility].    01-19-18 Unable to ask abuse questions daughter with her today.   Immunization History  Administered Date(s) Administered   Influenza Whole 02/09/2012   Influenza-Unspecified 01/17/2017   Pneumococcal-Unspecified 04/19/2014     Objective: Vital Signs: BP (!) 160/60 (BP Location: Left Arm, Patient Position: Sitting, Cuff Size: Normal)    Pulse 64    Resp 16    Ht 4\' 11"  (1.499 m)    Wt 96 lb 3.2 oz (43.6 kg)    BMI 19.43 kg/m    Physical Exam Vitals signs and nursing note reviewed.  Constitutional:      Appearance: She is well-developed.  HENT:     Head: Normocephalic and atraumatic.  Eyes:     Conjunctiva/sclera: Conjunctivae normal.  Neck:     Musculoskeletal: Normal range of motion.  Cardiovascular:     Rate and Rhythm: Normal rate and regular rhythm.     Heart sounds: Normal heart sounds.  Pulmonary:     Effort: Pulmonary effort is normal.     Breath sounds: Normal breath sounds.  Abdominal:     General: Bowel sounds are normal.     Palpations: Abdomen is soft.  Lymphadenopathy:     Cervical: No cervical adenopathy.  Skin:    General: Skin is warm and dry.     Capillary Refill: Capillary refill takes less than 2 seconds.  Neurological:     Mental Status: She is alert and oriented to person, place, and time.  Psychiatric:        Behavior: Behavior normal.      Musculoskeletal Exam: C-spine limited ROM.  Thoracic kyphosis noted.  Limited ROM of lumbar spine.  Shoulder abduction to about 90 degrees bilaterally.  She has limited range of motion of bilateral wrist joints.  Incomplete fist formation bilaterally.  Synovial thickening and ulnar deviation of MCP joints  but no synovitis was noted.  Hip joints difficult to assess in wheelchair.  Knee joints have mild warmth but no effusion.  No tenderness or swelling of ankle joints.   CDAI Exam: CDAI Score: -- Patient Global: --; Provider Global: -- Swollen: --; Tender: -- Joint Exam   No joint exam has been documented for this visit   There is currently no information documented on the homunculus. Go to the Rheumatology activity and complete the homunculus joint exam.  Investigation: No additional findings.  Imaging: No results found.  Recent Labs: Lab Results  Component Value Date   WBC 6.1 09/21/2017   HGB 9.2 (A) 09/21/2017   PLT 171 09/21/2017   NA 139 02/09/2019   K 4.5 02/09/2019   CL 106 02/09/2019   CO2 26 02/09/2019   GLUCOSE 119 (H) 02/09/2019   BUN 33 (H) 02/09/2019   CREATININE 1.07 02/09/2019   BILITOT 0.9 08/26/2017   ALKPHOS 39 08/26/2017   AST 20 08/26/2017   ALT 11 (L) 08/26/2017   PROT 5.3 (L) 08/26/2017   ALBUMIN 2.8 (L) 08/26/2017  CALCIUM 9.1 02/09/2019   GFRAA 51 (L) 01/18/2018    Speciality Comments: No specialty comments available.  Procedures:  No procedures performed Allergies: Latex, Boniva [ibandronic acid], Darvocet [propoxyphene n-acetaminophen], Erythromycin, Morphine and related, Oxycontin [oxycodone hcl], Sulfa antibiotics, Tramadol, and Vicodin [hydrocodone-acetaminophen]   Assessment / Plan:     Visit Diagnoses: Rheumatoid arthritis of multiple sites with negative rheumatoid factor ([place]) - Severe disease with nodulosis: She has no synovitis on exam.  She has not had any recent rheumatoid arthritis flares.  She is clinically doing well on prednisone 5 mg 1 tablet by mouth daily.  She does not want to take any immunosuppressive agents at this time.  She will continue on prednisone as prescribed.  She does not need any refills at this time.  She is advised to notify us if she develops increased joint pain or joint swelling.  She will follow-up in the  office in 6 months.  Rheumatoid nodulosis (H[facility])  High risk medication use - Prednisone 5 mg p.o. daily. She does not want to take SSZ or PLQ.  Primary osteoarthritis of both hands: She is given daily synovial thickening assist with osteoarthritis of both hands.  She has synovial thickening of all MCP joints and ulnar deviation bilaterally.  She has no discomfort or tenderness at this time.  Primary osteoarthritis of both feet: She has no feet pain or joint swelling currently.  She has pedal edema and takes furosemide as prescribed.  Age-related osteoporosis without current pathological fracture - Dr. Brigitte Pulse orders her DEXA scans.  She does not want to be on medication for the management of osteoporosis.   Other medical conditions are listed as follows:  History of lung cancer  History of COPD  Orders: No orders of the defined types were placed in this encounter.  No orders of the defined types were placed in this encounter.    Follow-Up Instructions: Return in about 6 months (around 08/20/2019) for Rheumatoid arthritis, Osteoarthritis, Osteoporosis.   Ofilia Neas, PA-C   I examined and evaluated the patient with Emily Sams PA.  Patient has severe end-stage rheumatoid arthritis.  She is on low-dose prednisone which is controlling her symptoms.  She does not want to take any DMARDs.  She also has osteoporosis and does not want to take any treatment for that.  I have advised her to come back in 6 months for evaluation.  If she develops any symptoms in the meantime she can contact us.  The plan of care was discussed as noted above.  Bo Merino, MD  Note - This record has been created using Editor, commissioning.  Chart creation errors have been sought, but may not always  have been located. Such creation errors do not reflect on  the standard of medical care.

## 2019-02-20 NOTE — Progress Notes (Deleted)
Office Visit Note  Patient: [name]             Date of Birth: Jun 02, 1929           MRN: [id number]             PCP: Mayra Neer, MD Referring: Mayra Neer, MD Visit Date: 02/20/2019 Occupation: @GUAROCC @  Subjective:  Rheumatoid Arthritis (Doing good)   History of Present Illness: [name] is a 85 y.o. female ***   Activities of Daily Living:  Patient reports morning stiffness for *** {minute/hour:19697}.   Patient {ACTIONS;DENIES/REPORTS:21021675::"Denies"} nocturnal pain.  Difficulty dressing/grooming: {ACTIONS;DENIES/REPORTS:21021675::"Denies"} Difficulty climbing stairs: {ACTIONS;DENIES/REPORTS:21021675::"Denies"} Difficulty getting out of chair: {ACTIONS;DENIES/REPORTS:21021675::"Denies"} Difficulty using hands for taps, buttons, cutlery, and/or writing: {ACTIONS;DENIES/REPORTS:21021675::"Denies"}  No Rheumatology ROS completed.   PMFS History:  Patient Active Problem List   Diagnosis Date Noted  . Abnormal CT of the chest 09/01/2017  . Adult failure to thrive 09/01/2017  . Neurocognitive disorder 08/30/2017  . NSTEMI (non-ST elevated myocardial infarction) ([place]) 08/25/2017  . 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 Primary osteoarthritis of both hands 06/09/2016  . Primary osteoarthritis of both feet 06/09/2016  . History of lung cancer 06/09/2016  . High risk medication use 06/09/2016  . Aortic calcification ([place]) 05/10/2013  . Coronary artery calcification 05/10/2013  . Protein-calorie malnutrition, severe ([place]) 12/25/2012  . Depression, recurrent ([place]) 12/25/2012  . Anemia of chronic disease 12/24/2012  . Thrombocytopenia, unspecified 12/24/2012  . Aphthous stomatitis 12/24/2012  . Bleeding from nasopharynx 12/24/2012  . Cellulitis 12/23/2012  . Hemoptysis? Secondary to either irritation by dentures, vs. secondary to lung neoplasm 12/23/2012  . Rheumatoid arthritis-severe 12/23/2012  . COPD, mild ([place]) 12/23/2012  . Osteoporosis  12/23/2012  . Primary cancer of right upper lobe of lung ([place]) 03/21/2012    Past Medical History:  Diagnosis Date  . Cancer ([place])    lung ca  . COPD (chronic obstructive pulmonary disease) ([place])   . Hernia, umbilical   . Hip pain, right   . History of radiation therapy 05/02/12-05/04/12,&05/09/12   rul lung 54Gy/16fx  . HLD (hyperlipidemia)   . Lung mass   . Myocardial infarction ([place]) 08/2017  . Osteoporosis   . PAC (premature atrial contraction)   . RA (rheumatoid arthritis) ([place])   . Shingles     Family History  Problem Relation Age of Onset  . Cancer Daughter   . Cancer Mother    Past Surgical History:  Procedure Laterality Date  . LEFT HEART CATH AND CORONARY ANGIOGRAPHY N/A 08/26/2017   Procedure: LEFT HEART CATH AND CORONARY ANGIOGRAPHY;  Surgeon: Martinique, Peter M, MD;  Location: [facility];  Service: Cardiovascular;  Laterality: N/A;  . right lobectomy     Social History   Social History Narrative   Lives in ALF at [facility].  08/2017 resident of [facility].    01-19-18 Unable to ask abuse questions daughter with her today.   Immunization History  Administered Date(s) Administered  . Influenza Whole 02/09/2012  . Influenza-Unspecified 01/17/2017  . Pneumococcal-Unspecified 04/19/2014     Objective: Vital Signs: BP (!) 160/60 (BP Location: Left Arm, Patient Position: Sitting, Cuff Size: Normal)   Pulse 64   Resp 16   Ht 4\' 11"  (1.499 m)   Wt 96 lb 3.2 oz (43.6 kg)   BMI 19.43 kg/m    Physical Exam   Musculoskeletal Exam: ***  CDAI Exam: CDAI Score: - Patient Global: -; Provider Global: - Swollen: -; Tender: -  Joint Exam   No joint exam has been documented for this visit   There is currently no information documented on the homunculus. Go to the Rheumatology activity and complete the homunculus joint exam.  Investigation: No additional findings.  Imaging: No results found.  Recent Labs: Lab Results   Component Value Date   WBC 6.1 09/21/2017   HGB 9.2 (A) 09/21/2017   PLT 171 09/21/2017   NA 139 02/09/2019   K 4.5 02/09/2019   CL 106 02/09/2019   CO2 26 02/09/2019   GLUCOSE 119 (H) 02/09/2019   BUN 33 (H) 02/09/2019   CREATININE 1.07 02/09/2019   BILITOT 0.9 08/26/2017   ALKPHOS 39 08/26/2017   AST 20 08/26/2017   ALT 11 (L) 08/26/2017   PROT 5.3 (L) 08/26/2017   ALBUMIN 2.8 (L) 08/26/2017   CALCIUM 9.1 02/09/2019   GFRAA 51 (L) 01/18/2018    Speciality Comments: No specialty comments available.  Procedures:  No procedures performed Allergies: Latex, Boniva [ibandronic acid], Darvocet [propoxyphene n-acetaminophen], Erythromycin, Morphine and related, Oxycontin [oxycodone hcl], Sulfa antibiotics, Tramadol, and Vicodin [hydrocodone-acetaminophen]   Assessment / Plan:     Visit Diagnoses: Rheumatoid arthritis of multiple sites with negative rheumatoid factor (HCC) - Severe disease with nodulosis  Rheumatoid nodulosis (HCC)  High risk medication use - Prednisone 5 mg p.o. daily. She does not want to take SSZ or PLQ.  Primary osteoarthritis of both hands  Primary osteoarthritis of both feet  Age-related osteoporosis without current pathological fracture - Dr. Brigitte Pulse orders her DEXA scans.  She does not want to be on medication for the management of osteoporosis.   History of lung cancer  History of COPD  Orders: No orders of the defined types were placed in this encounter.  No orders of the defined types were placed in this encounter.   Face-to-face time spent with patient was *** minutes. Greater than 50% of time was spent in counseling and coordination of care.  Follow-Up Instructions: No follow-ups on file.   Bo Merino, MD  Note - This record has been created using Editor, commissioning.  Chart creation errors have been sought, but may not always  have been located. Such creation errors do not reflect on  the standard of medical care.

## 2019-02-21 ENCOUNTER — Ambulatory Visit (INDEPENDENT_AMBULATORY_CARE_PROVIDER_SITE_OTHER)
Admission: RE | Admit: 2019-02-21 | Discharge: 2019-02-21 | Disposition: A | Discharge status: Home / Self Care | Payer: Medicare Other | Source: Ambulatory Visit | Attending: Pulmonary Disease | Admitting: Pulmonary Disease

## 2019-02-21 ENCOUNTER — Other Ambulatory Visit: Payer: Self-pay

## 2019-02-21 DIAGNOSIS — J449 Chronic obstructive pulmonary disease, unspecified: Secondary | ICD-10-CM

## 2019-02-21 MED ORDER — IOHEXOL 300 MG/ML  SOLN
80.0000 mL | Freq: Once | INTRAMUSCULAR | Status: AC | PRN
  Administered 2019-02-21: 70 mL via INTRAVENOUS

## 2019-02-28 ENCOUNTER — Telehealth: Payer: Self-pay

## 2019-02-28 DIAGNOSIS — J471 Bronchiectasis with (acute) exacerbation: Secondary | ICD-10-CM

## 2019-02-28 NOTE — Telephone Encounter (Signed)
-----   Message from Marshell Garfinkel, MD sent at 02/28/2019  8:53 AM EST ----- Please let patient know that CT scan the lung opacity and scarring in the lung is stable. Please make routine follow-up visit in 3 months and order CT scan without contrast in 1 year

## 2019-03-05 ENCOUNTER — Ambulatory Visit (INDEPENDENT_AMBULATORY_CARE_PROVIDER_SITE_OTHER): Payer: Medicare Other | Admitting: Podiatry

## 2019-03-05 ENCOUNTER — Other Ambulatory Visit: Payer: Self-pay

## 2019-03-05 DIAGNOSIS — B351 Tinea unguium: Secondary | ICD-10-CM

## 2019-03-05 DIAGNOSIS — M79671 Pain in right foot: Secondary | ICD-10-CM | POA: Diagnosis not present

## 2019-03-05 DIAGNOSIS — M0579 Rheumatoid arthritis with rheumatoid factor of multiple sites without organ or systems involvement: Secondary | ICD-10-CM

## 2019-03-05 DIAGNOSIS — M79672 Pain in left foot: Secondary | ICD-10-CM

## 2019-03-05 DIAGNOSIS — M79674 Pain in right toe(s): Secondary | ICD-10-CM | POA: Diagnosis not present

## 2019-03-05 DIAGNOSIS — M79675 Pain in left toe(s): Secondary | ICD-10-CM

## 2019-03-05 DIAGNOSIS — L84 Corns and callosities: Secondary | ICD-10-CM | POA: Diagnosis not present

## 2019-03-11 ENCOUNTER — Encounter: Payer: Self-pay | Admitting: Podiatry

## 2019-03-11 NOTE — Progress Notes (Signed)
Subjective: [name] presents to clinic with cc of painful mycotic toenails and chronic preulcerative calluses left foot which are aggravated when weightbearing with and without shoe gear.  This pain limits her daily activities. Pain symptoms resolve with periodic professional debridement.  She has RA and severe digital deformities which contribute to her plantar lesions.   She is accompanied by her daughter on today's visit.   She wears [employer]fo[employer] slippers with memory foam soles.  Mayra Neer, MD is her PCP.   She is requesting a written Rx for padding for her feet as support for her housing.   Current Outpatient Medications on File Prior to Visit  Medication Sig Dispense Refill  . acetaminophen (TYLENOL) 325 MG tablet Take 650 mg by mouth every 8 (eight) hours as needed.    . carvedilol (COREG) 3.125 MG tablet Take 1 tablet (3.125 mg total) by mouth 2 (two) times daily with a meal. 60 tablet 0  . furosemide (LASIX) 20 MG tablet Take 20 mg by mouth every morning.    Marland Kitchen ipratropium-albuterol (DUONEB) 0.5-2.5 (3) MG/3ML SOLN Take 3 mLs by nebulization every 4 (four) hours as needed (wheezing, shortness of breath). Dx: J44.9 360 mL 5  . Multiple Vitamins-Minerals (ALIVE ONCE DAILY WOMENS) TABS Take 1 tablet by mouth daily.    . predniSONE (DELTASONE) 5 MG tablet TAKE 1 TABLET(5 MG) BY MOUTH DAILY WITH BREAKFAST 90 tablet 0  . ramelteon (ROZEREM) 8 MG tablet Take 1 tablet (8 mg total) by mouth at bedtime. (Patient not taking: Reported on 02/20/2019) 30 tablet 0   No current facility-administered medications on file prior to visit.      Allergies  Allergen Reactions  . Latex Other (See Comments)    Blisters on her mouth  . Boniva [Ibandronic Acid]     Muscle aches  . Darvocet [Propoxyphene N-Acetaminophen] Nausea And Vomiting  . Erythromycin Nausea And Vomiting  . Morphine And Related Nausea And Vomiting  . Oxycontin [Oxycodone Hcl] Nausea And Vomiting  . Sulfa Antibiotics  Nausea And Vomiting  . Tramadol Nausea And Vomiting  . Vicodin [Hydrocodone-Acetaminophen]     dizzy     Objective: There were no vitals filed for this visit.  Physical Examination:  Vascular  Examination: Capillary refill time immediate b/l.   DP/PT pulses palpable b/l.  Digital hair decreased b/l.   No edema noted b/l.  Skin temperature gradient WNL b/l.  Dermatological Examination: Skin thin, shiny and atrophic b/l.  No open wounds b/l.  No interdigital macerations noted b/l.  Elongated, thick, discolored brittle toenails with subungual debris and pain on dorsal palpation of nailbeds 1-5 b/l.  Hyperkeratotic lesion submet head 1, 2 5 left foot and submet head 2 right foot with subdermal hemorrhage. There is tenderness to palpation. No edema, no erythema, no drainage, no flocculence.   Musculoskeletal Examination: Muscle strength 5/5 to all muscle groups b/l.  Rigid hammertoes 1-5 b/l.  Severe bunion deformity b/l.  Neurological Examination: Sensation intact 5/5 b/l with 10 gram monofilament.  Assessment: 1. Mycotic nail infection with pain 1-5 b/l 2. Preulcerative calluses submet heads 1, 2, 5 left foot and submet head 2 right foot 3. Pain in limb b/l 4. RA  Plan: 1. Toenails 1-5 b/l were debrided in length and girth without iatrogenic laceration.  2. Preulcerative calluses pared submet heads 1, 2, 5 left foot and submet head 2 right foot utilizing sterile scalpel blade without incident. 3. Continue soft, supportive shoe gear daily. 4. Report any pedal injuries  to medical professional. 5. Follow up 3 months per daughter's request. 6. Written prescription for accommodative padding will be mailed to her home. 7. Patient/POA to call should there be a question/concern in there interim.

## 2019-03-21 ENCOUNTER — Ambulatory Visit (INDEPENDENT_AMBULATORY_CARE_PROVIDER_SITE_OTHER): Payer: Medicare Other | Admitting: Pulmonary Disease

## 2019-03-21 ENCOUNTER — Encounter: Payer: Self-pay | Admitting: Pulmonary Disease

## 2019-03-21 ENCOUNTER — Other Ambulatory Visit: Payer: Self-pay

## 2019-03-21 VITALS — BP 120/68 | HR 70 | Temp 97.3°F | Ht 59.0 in | Wt 98.6 lb

## 2019-03-21 DIAGNOSIS — J4489 Other specified chronic obstructive pulmonary disease: Secondary | ICD-10-CM

## 2019-03-21 DIAGNOSIS — J449 Chronic obstructive pulmonary disease, unspecified: Secondary | ICD-10-CM | POA: Diagnosis not present

## 2019-03-21 NOTE — Patient Instructions (Addendum)
Your CT scan shows stable lung findings of scarring from your cancer treatment There are no new changes which is good news We will get a follow-up CT with contrast in 1 year time Follow-up in clinic after CT scan

## 2019-03-21 NOTE — Progress Notes (Signed)
[name]    [id number]    1929/05/05  Primary Care Physician:Shaw, [name], MD  Referring Physician: Mayra Neer, MD 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  Follows with Dr. Estanislado Pandy for rheumatoid arthritis.  Previously treated with methotrexate but is currently on chronic prednisone.  She has a history of COPD but no PFTs on record.  Hospitalized in May 19 for non-ST elevation MI, she underwent a cardiac cath which did not show significant obstructive coronary artery disease. Denies dyspnea on exertion, cough, sputum production, wheezing.   Interim history: No new issues.  She is on duo nebs which he uses occasionally as needed States that breathing is doing well Continues on low-dose prednisone for rheumatoid arthritis.  We had prescribed ramelteon for insomnia but she did not pick this up as the co-pay was too high.  Outpatient Encounter Medications as of 03/21/2019  Medication Sig  . acetaminophen (TYLENOL) 325 MG tablet Take 650 mg by mouth every 8 (eight) hours as needed.  . carvedilol (COREG) 3.125 MG tablet Take 1 tablet (3.125 mg total) by mouth 2 (two) times daily with a meal.  . furosemide (LASIX) 20 MG tablet Take 20 mg by mouth every morning.  Marland Kitchen ipratropium-albuterol (DUONEB) 0.5-2.5 (3) MG/3ML SOLN Take 3 mLs by nebulization every 4 (four) hours as needed (wheezing, shortness of breath). Dx: J44.9  . Multiple Vitamins-Minerals (ALIVE ONCE DAILY WOMENS) TABS Take 1 tablet by mouth daily.  . predniSONE (DELTASONE) 5 MG tablet TAKE 1 TABLET(5 MG) BY MOUTH DAILY WITH BREAKFAST  . [DISCONTINUED] ramelteon (ROZEREM) 8 MG tablet Take 1 tablet (8 mg total) by mouth at bedtime.   No facility-administered encounter medications on file as of  03/21/2019.    Physical Exam: Blood pressure 120/68, pulse 70, temperature (!) 97.3 F (36.3 C), temperature source Temporal, height 4\' 11"  (4.627 m), weight 98 lb 9.6 oz (44.7 kg), SpO2 97 %. Gen:      No acute distress HEENT:  EOMI, sclera anicteric Neck:     No masses; no thyromegaly Lungs:    Clear to auscultation bilaterally; normal respiratory effort CV:         Regular rate and rhythm; no murmurs Abd:      + bowel sounds; soft, non-tender; no palpable masses, no distension Ext:    No edema; adequate peripheral perfusion Skin:      Warm and dry; no rash Neuro: alert and oriented x 3 Psych: normal mood and affect  Data Reviewed: Imaging CT Angio 08/25/2017- cardiomegaly, coronary atherosclerosis.  Atelectasis, bronchiectasis in the right upper lobe.  Bibasilar scarring with nodular opacity in the left lower lobe stable compared to 2014.  I reviewed the images personally CT chest 01/18/2018- masslike archetectural distortion within the right lung is stable.  Stable left lower lobe pulmonary nodule. CT chest 02/21/2019-stable post treatment changes in the right lung.  Stable pulmonary nodules I have reviewed the images personally.  PFTs 11/25/2017 FVC 1.32 [79%], FEV1 0.79 [66%], F/F 60, TLC 86%, DLCO 49% Moderate obstruction, severe diffusion defect.  Echo cardiogram 08/26/17 LVEF 65-70%, severe LVH, normal wall motion, grade 1 DD, indeterminate LV filling pressure, aortic valve sclerosis with trivial AI, trivial MR, normal LA size, normal IVC.  Assessment:  Moderate COPD PFTs reviewed  with moderate obstructive airway disease.  She is currently on duo nebs which she is using intermittently Symptoms are well controlled so we will continue the same.  There is no need to escalate therapy at this point. We are avoiding inhalers given lack of dexterity due to rheumatoid arthritis.  Lung cancer status post XRT Imaging reviewed which shows consolidation, bronchiectasis in the right  upper lobe which could be post radiation changes.  In addition there are some nodular opacities that have remained stable.  We will continue to follow this  Plan/Recommendations: - Continue duo nebs - Follow up CT in 1 year.  Marshell Garfinkel MD [facility] 03/21/2019, 1:57 PM  CC: Mayra Neer, MD

## 2019-04-16 ENCOUNTER — Telehealth: Payer: Self-pay

## 2019-04-16 MED ORDER — PREDNISONE 5 MG PO TABS: ORAL_TABLET | ORAL | 0 refills | Status: DC

## 2019-04-16 NOTE — Telephone Encounter (Signed)
Refill request received via fax from [facility] on [address] for prednisone.   Last Visit: 02/20/2019 Next Visit: 08/21/2019  Okay to refill per Dr. Estanislado Pandy.

## 2019-05-23 ENCOUNTER — Encounter: Payer: Self-pay | Admitting: Podiatry

## 2019-05-23 ENCOUNTER — Ambulatory Visit (INDEPENDENT_AMBULATORY_CARE_PROVIDER_SITE_OTHER): Payer: Medicare Other | Admitting: Podiatry

## 2019-05-23 ENCOUNTER — Other Ambulatory Visit: Payer: Self-pay

## 2019-05-23 DIAGNOSIS — L84 Corns and callosities: Secondary | ICD-10-CM

## 2019-05-23 DIAGNOSIS — M79674 Pain in right toe(s): Secondary | ICD-10-CM | POA: Diagnosis not present

## 2019-05-23 DIAGNOSIS — B351 Tinea unguium: Secondary | ICD-10-CM

## 2019-05-23 DIAGNOSIS — M0579 Rheumatoid arthritis with rheumatoid factor of multiple sites without organ or systems involvement: Secondary | ICD-10-CM | POA: Diagnosis not present

## 2019-05-23 DIAGNOSIS — M79671 Pain in right foot: Secondary | ICD-10-CM

## 2019-05-23 DIAGNOSIS — M79672 Pain in left foot: Secondary | ICD-10-CM

## 2019-05-23 DIAGNOSIS — M79675 Pain in left toe(s): Secondary | ICD-10-CM | POA: Diagnosis not present

## 2019-05-23 NOTE — Patient Instructions (Signed)
Rheumatoid Arthritis Rheumatoid arthritis (RA) is a long-term (chronic) disease that causes inflammation in your joints. RA may start slowly. It most often affects the small joints of the hands and feet. Usually, the same joints are affected on both sides of your body. Inflammation from RA can also affect other parts of your body, including your heart, eyes, or lungs. There is no cure for RA, but medicines can help your symptoms and halt or slow down the progression of the disease. What are the causes? RA is an autoimmune disease. When you have an autoimmune disease, your body's defense system (immune system) mistakenly attacks healthy body tissues. The exact cause of RA is not known. What increases the risk? You are more likely to develop this condition if you:  Are a woman.  Have a family history of RA or other autoimmune diseases.  Have a history of smoking.  Are obese.  Have been exposed to pollutants or chemicals. What are the signs or symptoms? The first symptom of this condition may be morning stiffness that lasts longer than 30 minutes.  Symptoms usually start gradually. They are often worse in the morning. As RA progresses, symptoms may include:  Pain, stiffness, swelling, warmth, and tenderness in joints on both sides of your body.  Loss of energy.  Loss of appetite.  Weight loss.  Low-grade fever.  Dry eyes and dry mouth.  Firm lumps (rheumatoid nodules) that grow beneath your skin in areas such as your forearm bones near your elbows and on your hands.  Changes in the appearance of joints (deformity) and loss of joint function. Symptoms of this condition vary from person to person.  Symptoms of RA often come and go.  Sometimes, symptoms get worse for a period of time. These are called flares. How is this diagnosed? This condition is diagnosed based on your symptoms, medical history, and physical exam.  You may have X-rays or an MRI to check for the type of  joint changes that are caused by RA. You may also have blood tests to look for:  Proteins (antibodies) that your immune system may make if you have RA. These include rheumatoid factor (RF) and anti-CCP. ? When blood tests show these proteins, you are said to have "seropositive RA." ? When blood tests do not show these proteins, you may have "seronegative RA."  Inflammation in your blood.  A low number of red blood cells (anemia). How is this treated? The goals of treatment are to relieve pain, reduce inflammation, and slow down or stop joint damage and disability. Treatment may include:  Lifestyle changes. It is important to rest as needed, eat a healthy diet, and exercise.  Medicines. Your health care provider may adjust your medicines every 3 months until treatment goals are reached. Common medicines include: ? Pain relievers (analgesics). ? Corticosteroids and NSAIDs to reduce inflammation. ? Disease-modifying antirheumatic drugs (DMARDs) to try to slow the course of the disease. ? Biologic response modifiers to reduce inflammation and damage.  Physical therapy and occupational therapy.  Surgery, if you have severe joint damage. Joint replacement or fusing of joints may be needed. Your health care provider will work with you to identify the best treatment option for you based on assessment of the overall disease activity in your body. Follow these instructions at home: Activity  Return to your normal activities as told by your health care provider. Ask your health care provider what activities are safe for you.  Rest when you are having a  flare.  Start an exercise program as told by your health care provider. General instructions  Keep all follow-up visits as told by your health care provider. This is important.  Take over-the-counter and prescription medicines only as told by your health care provider. Where to find more information  [employer] of Rheumatology:  www.rheumatology.[place]: www.arthritis.org Contact a health care provider if:  You have a flare-up of RA symptoms.  You have a fever.  You have side effects from your medicines. Get help right away if:  You have chest pain.  You have trouble breathing.  You quickly develop a hot, painful joint that is more severe than your usual joint aches. Summary  Rheumatoid arthritis (RA) is a long-term (chronic) disease that causes inflammation in your joints.  RA is an autoimmune disease.  The goals of treatment are to relieve pain, reduce inflammation, and slow down or stop joint damage and disability. This information is not intended to replace advice given to you by your health care provider. Make sure you discuss any questions you have with your health care provider. Document Revised: 10/17/2018 Document Reviewed: 12/06/2017 Elsevier Patient Education  2020 [employer].

## 2019-05-27 NOTE — Progress Notes (Signed)
Subjective: Mackenzi Krogh presents today for follow up of follow up with history of rheumatoid arthritis with severe pedal digital deformities and painful preulcerative lesions left foot.  She is accompanied by her daughter on today's visit. Most symptomatic is her submet head 2 callus of the left foot..   Allergies  Allergen Reactions  . Latex Other (See Comments)    Blisters on her mouth  . Boniva [Ibandronic Acid]     Muscle aches  . Darvocet [Propoxyphene N-Acetaminophen] Nausea And Vomiting  . Erythromycin Nausea And Vomiting  . Morphine And Related Nausea And Vomiting  . Oxycontin [Oxycodone Hcl] Nausea And Vomiting  . Sulfa Antibiotics Nausea And Vomiting  . Tramadol Nausea And Vomiting  . Vicodin [Hydrocodone-Acetaminophen]     dizzy     Objective: There were no vitals filed for this visit.  Vascular Examination:  Capillary refill time to digits immediate b/l, palpable DP pulses b/l, palpable PT pulses b/l, pedal hair sparse b/l and skin temperature gradient within normal limits b/l  Dermatological Examination: Pedal skin is thin shiny, atrophic bilaterally, no open wounds bilaterally, no interdigital macerations bilaterally, toenails 1-5 b/l elongated, dystrophic, thickened, crumbly with subungual debris and hyperkeratotic lesion(s) submet heads 1, 2, 5 left foot, submet head 2 right foot. Submet head 2 lesion left foot with noted subdermal hemorrhage and tenderness to palpation. No suspected abscess or underlying wound. Fat pad atrophy noted b/l feet.Marland Kitchen  No erythema, no edema, no drainage, no flocculence  Musculoskeletal: Normal muscle strength 5/5 to all lower extremity muscle groups bilaterally, no pain crepitus or joint limitation noted with ROM b/l and Severely plantarflexed metatarsals 1-5 b/l.  Neurological: Protective sensation intact 5/5 intact bilaterally with 10g monofilament b/l  Assessment: Pain due to onychomycosis of toenails of both feet  Pre-ulcerative  calluses submet head 2 left foot  Callus submet head 1, 5 left foot and submet head 2 right foot  Rheumatoid arthritis involving multiple sites with positive rheumatoid factor (HCC)  Pain in both feet  Plan: -Toenails 1-5 b/l were debrided in length and girth without iatrogenic bleeding. -calluses were debrided without complication or incident. Total number debrided =3, submet head 1, 2, 5 left foot and submet head 2 right foot -Patient to continue soft, supportive shoe gear daily. -Patient to report any pedal injuries to medical professional immediately. -Patient/POA to call should there be question/concern in the interim.  Return in about 10 weeks (around 08/01/2019) for nail and callus trim.

## 2019-07-25 ENCOUNTER — Telehealth: Payer: Self-pay | Admitting: Rheumatology

## 2019-07-25 MED ORDER — PREDNISONE 5 MG PO TABS: ORAL_TABLET | ORAL | 0 refills | Status: DC

## 2019-07-25 NOTE — Telephone Encounter (Signed)
Patient called requesting prescription refill of Prednisone 5 mg (90 tablets) to be sent to [facility] at [address].

## 2019-07-25 NOTE — Telephone Encounter (Signed)
Last Visit: 02/20/19 Next Visit: 08/21/19  Current Dose per office note on 02/20/19: prednisone 5 mg 1 tablet by mouth daily.   Okay to refill per Dr. Estanislado Pandy

## 2019-08-14 ENCOUNTER — Encounter: Payer: Self-pay | Admitting: Podiatry

## 2019-08-14 ENCOUNTER — Ambulatory Visit (INDEPENDENT_AMBULATORY_CARE_PROVIDER_SITE_OTHER): Payer: Medicare Other | Admitting: Podiatry

## 2019-08-14 ENCOUNTER — Other Ambulatory Visit: Payer: Self-pay

## 2019-08-14 DIAGNOSIS — M79674 Pain in right toe(s): Secondary | ICD-10-CM | POA: Diagnosis not present

## 2019-08-14 DIAGNOSIS — M79671 Pain in right foot: Secondary | ICD-10-CM | POA: Diagnosis not present

## 2019-08-14 DIAGNOSIS — M79675 Pain in left toe(s): Secondary | ICD-10-CM

## 2019-08-14 DIAGNOSIS — M79672 Pain in left foot: Secondary | ICD-10-CM | POA: Diagnosis not present

## 2019-08-14 DIAGNOSIS — M0579 Rheumatoid arthritis with rheumatoid factor of multiple sites without organ or systems involvement: Secondary | ICD-10-CM

## 2019-08-14 DIAGNOSIS — B351 Tinea unguium: Secondary | ICD-10-CM | POA: Diagnosis not present

## 2019-08-14 DIAGNOSIS — L84 Corns and callosities: Secondary | ICD-10-CM | POA: Diagnosis not present

## 2019-08-14 NOTE — Patient Instructions (Signed)
Corns and Calluses Corns are small areas of thickened skin that occur on the top, sides, or tip of a toe. They contain a cone-shaped core with a point that can press on a nerve below. This causes pain.  Calluses are areas of thickened skin that can occur anywhere on the body, including the hands, fingers, palms, soles of the feet, and heels. Calluses are usually larger than corns. What are the causes? Corns and calluses are caused by rubbing (friction) or pressure, such as from shoes that are too tight or do not fit properly. What increases the risk? Corns are more likely to develop in people who have misshapen toes (toe deformities), such as hammer toes. Calluses can occur with friction to any area of the skin. They are more likely to develop in people who:  Work with their hands.  Wear shoes that fit poorly, are too tight, or are high-heeled.  Have toe deformities. What are the signs or symptoms? Symptoms of a corn or callus include:  A hard growth on the skin.  Pain or tenderness under the skin.  Redness and swelling.  Increased discomfort while wearing tight-fitting shoes, if your feet are affected. If a corn or callus becomes infected, symptoms may include:  Redness and swelling that gets worse.  Pain.  Fluid, blood, or pus draining from the corn or callus. How is this diagnosed? Corns and calluses may be diagnosed based on your symptoms, your medical history, and a physical exam. How is this treated? Treatment for corns and calluses may include:  Removing the cause of the friction or pressure. This may involve: ? Changing your shoes. ? Wearing shoe inserts (orthotics) or other protective layers in your shoes, such as a corn pad. ? Wearing gloves.  Applying medicine to the skin (topical medicine) to help soften skin in the hardened, thickened areas.  Removing layers of dead skin with a file to reduce the size of the corn or callus.  Removing the corn or callus with a  scalpel or laser.  Taking antibiotic medicines, if your corn or callus is infected.  Having surgery, if a toe deformity is the cause. Follow these instructions at home:   Take over-the-counter and prescription medicines only as told by your health care provider.  If you were prescribed an antibiotic, take it as told by your health care provider. Do not stop taking it even if your condition starts to improve.  Wear shoes that fit well. Avoid wearing high-heeled shoes and shoes that are too tight or too loose.  Wear any padding, protective layers, gloves, or orthotics as told by your health care provider.  Soak your hands or feet and then use a file or pumice stone to soften your corn or callus. Do this as told by your health care provider.  Check your corn or callus every day for symptoms of infection. Contact a health care provider if you:  Notice that your symptoms do not improve with treatment.  Have redness or swelling that gets worse.  Notice that your corn or callus becomes painful.  Have fluid, blood, or pus coming from your corn or callus.  Have new symptoms. Summary  Corns are small areas of thickened skin that occur on the top, sides, or tip of a toe.  Calluses are areas of thickened skin that can occur anywhere on the body, including the hands, fingers, palms, and soles of the feet. Calluses are usually larger than corns.  Corns and calluses are caused by   rubbing (friction) or pressure, such as from shoes that are too tight or do not fit properly.  Treatment may include wearing any padding, protective layers, gloves, or orthotics as told by your health care provider. This information is not intended to replace advice given to you by your health care provider. Make sure you discuss any questions you have with your health care provider. Document Revised: 07/26/2018 Document Reviewed: 02/16/2017 Elsevier Patient Education  2020 [employer]  Onychomycosis/Fungal  Toenails  WHAT IS IT? An infection that lies within the keratin of your nail plate that is caused by a fungus.  WHY ME? Fungal infections affect all ages, sexes, races, and creeds.  There may be many factors that predispose you to a fungal infection such as age, coexisting medical conditions such as diabetes, or an autoimmune disease; stress, medications, fatigue, genetics, etc.  Bottom line: fungus thrives in a warm, moist environment and your shoes offer such a location.  IS IT CONTAGIOUS? Theoretically, yes.  You do not want to share shoes, nail clippers or files with someone who has fungal toenails.  Walking around barefoot in the same room or sleeping in the same bed is unlikely to transfer the organism.  It is important to realize, however, that fungus can spread easily from one nail to the next on the same foot.  HOW DO WE TREAT THIS?  There are several ways to treat this condition.  Treatment may depend on many factors such as age, medications, pregnancy, liver and kidney conditions, etc.  It is best to ask your doctor which options are available to you.  4. No treatment.   Unlike many other medical concerns, you can live with this condition.  However for many people this can be a painful condition and may lead to ingrown toenails or a bacterial infection.  It is recommended that you keep the nails cut short to help reduce the amount of fungal nail. 5. Topical treatment.  These range from herbal remedies to prescription strength nail lacquers.  About 40-50% effective, topicals require twice daily application for approximately 9 to 12 months or until an entirely new nail has grown out.  The most effective topicals are medical grade medications available through physicians offices. 6. Oral antifungal medications.  With an 80-90% cure rate, the most common oral medication requires 3 to 4 months of therapy and stays in your system for a year as the new nail grows out.  Oral antifungal medications do  require blood work to make sure it is a safe drug for you.  A liver function panel will be performed prior to starting the medication and after the first month of treatment.  It is important to have the blood work performed to avoid any harmful side effects.  In general, this medication safe but blood work is required. 7. Laser Therapy.  This treatment is performed by applying a specialized laser to the affected nail plate.  This therapy is noninvasive, fast, and non-painful.  It is not covered by insurance and is therefore, out of pocket.  The results have been very good with a 80-95% cure rate.  The [facility] is the only practice in the area to offer this therapy. 8. Permanent Nail Avulsion.  Removing the entire nail so that a new nail will not grow back. 

## 2019-08-16 NOTE — Progress Notes (Signed)
Office Visit Note  Patient: [name]             Date of Birth: 05-17-1929           MRN: [id number]             PCP: Mayra Neer, MD Referring: Mayra Neer, MD Visit Date: 08/21/2019 Occupation: @GUAROCC @  Subjective:  Pain in both hands   History of Present Illness: [name] is a 86 y.o. female with history of seronegative rheumatoid arthritis and osteoarthritis.  Patient is taking long-term prednisone 5 mg by mouth once daily.  She is not taking an immunosuppressive agents at this time.  She has been resistant to start on Plaquenil or sulfasalazine in the past due to potential side effects.  She was evaluated in the emergency department on 08/17/2019 due to having increased pain and inflammation in both elbow joints and both hands.  She was started on a 4-day course of prednisone 20 mg daily.  She notices significant improvement on the higher dose of prednisone.  Today she took 10 mg of prednisone.  She continues to have pain and inflammation in the left elbow, right wrist, left hand, and left knee joint.  She would like to discuss increasing her daily dose of prednisone.    Activities of Daily Living:  Patient reports morning stiffness for 0   minutes.   Patient Denies nocturnal pain.  Difficulty dressing/grooming: Denies Difficulty climbing stairs: Reports Difficulty getting out of chair: Reports Difficulty using hands for taps, buttons, cutlery, and/or writing: Reports  Review of Systems  Constitutional: Negative for fatigue.  HENT: Negative for mouth sores, mouth dryness and nose dryness.   Eyes: Negative for pain, visual disturbance and dryness.  Respiratory: Negative for cough, hemoptysis, shortness of breath and difficulty breathing.   Cardiovascular: Positive for swelling in legs/feet. Negative for chest pain, palpitations and hypertension.  Gastrointestinal: Negative for blood in stool, constipation and diarrhea.  Endocrine: Negative for increased  urination.  Genitourinary: Positive for nocturia. Negative for painful urination.  Musculoskeletal: Positive for arthralgias, joint pain and joint swelling. Negative for myalgias, muscle weakness, morning stiffness, muscle tenderness and myalgias.  Skin: Negative for color change, pallor, rash, hair loss, nodules/bumps, skin tightness, ulcers and sensitivity to sunlight.  Allergic/Immunologic: Negative for susceptible to infections.  Neurological: Negative for dizziness, numbness, headaches and weakness.  Hematological: Negative for swollen glands.  Psychiatric/Behavioral: Negative for depressed mood and sleep disturbance. The patient is not nervous/anxious.     PMFS History:  Patient Active Problem List   Diagnosis Date Noted  . Abnormal CT of the chest 09/01/2017  . Adult failure to thrive 09/01/2017  . Neurocognitive disorder 08/30/2017  . NSTEMI (non-ST elevated myocardial infarction) ([place]) 08/25/2017  . 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 Primary osteoarthritis of both hands 06/09/2016  . Primary osteoarthritis of both feet 06/09/2016  . History of lung cancer 06/09/2016  . High risk medication use 06/09/2016  . Aortic calcification ([place]) 05/10/2013  . Coronary artery calcification 05/10/2013  . Protein-calorie malnutrition, severe ([place]) 12/25/2012  . Depression, recurrent ([place]) 12/25/2012  . Anemia of chronic disease 12/24/2012  . Thrombocytopenia, unspecified 12/24/2012  . Aphthous stomatitis 12/24/2012  . Bleeding from nasopharynx 12/24/2012  . Cellulitis 12/23/2012  . Hemoptysis? Secondary to either irritation by dentures, vs. secondary to lung neoplasm 12/23/2012  . Rheumatoid arthritis-severe 12/23/2012  . COPD, mild ([place]) 12/23/2012  . Osteoporosis 12/23/2012  . Primary cancer of right upper lobe of lung ([place]) 03/21/2012  Past Medical History:  Diagnosis Date  . Cancer ([place])    lung ca  . COPD (chronic obstructive pulmonary disease) ([place])   . Hernia,  umbilical   . Hip pain, right   . History of radiation therapy 05/02/12-05/04/12,&05/09/12   rul lung 54Gy/30fx  . HLD (hyperlipidemia)   . Lung mass   . Myocardial infarction ([place]) 08/2017  . Osteoporosis   . PAC (premature atrial contraction)   . RA (rheumatoid arthritis) ([place])   . Shingles     Family History  Problem Relation Age of Onset  . Cancer Daughter   . Cancer Mother    Past Surgical History:  Procedure Laterality Date  . LEFT HEART CATH AND CORONARY ANGIOGRAPHY N/A 08/26/2017   Procedure: LEFT HEART CATH AND CORONARY ANGIOGRAPHY;  Surgeon: Martinique, Peter M, MD;  Location: [facility];  Service: Cardiovascular;  Laterality: N/A;  . right lobectomy     Social History   Social History Narrative   Lives in ALF at [facility].  08/2017 resident of [facility].    01-19-18 Unable to ask abuse questions daughter with her today.   Immunization History  Administered Date(s) Administered  . Influenza Whole 02/09/2012  . Influenza-Unspecified 01/17/2017  . Pneumococcal-Unspecified 04/19/2014     Objective: Vital Signs: BP (!) 148/63 (BP Location: Left Arm, Patient Position: Sitting, Cuff Size: Normal)   Pulse 63   Resp 18   Ht 4\' 9"  (1.448 m)   Wt 98 lb 3.2 oz (44.5 kg)   BMI 21.25 kg/m    Physical Exam Vitals and nursing note reviewed.  Constitutional:      Appearance: She is well-developed.  HENT:     Head: Normocephalic and atraumatic.  Eyes:     Conjunctiva/sclera: Conjunctivae normal.  Pulmonary:     Effort: Pulmonary effort is normal.  Abdominal:     General: Bowel sounds are normal.     Palpations: Abdomen is soft.  Musculoskeletal:     Cervical back: Normal range of motion.  Lymphadenopathy:     Cervical: No cervical adenopathy.  Skin:    General: Skin is warm and dry.     Capillary Refill: Capillary refill takes less than 2 seconds.  Neurological:     Mental Status: She is alert and oriented to person, place, and  time.  Psychiatric:        Behavior: Behavior normal.      Musculoskeletal Exam: C-spine good range of motion with no discomfort.  Thoracic kyphosis noted.  Lumbar spine difficult to assess in seated position.  Shoulder abduction to about 90 degrees bilaterally.  Mild flexion contracture in the left elbow.  Tenderness and synovitis of the left elbow joint noted.  She has very limited range of motion of both wrist joints.  She has tenderness and inflammation on the ulnar aspect of the right wrist.  Tenderness and synovitis of the left fourth and fifth MCP joints.  Incomplete fist formation bilaterally.  Synovial thickening and ulnar deviation of all MCP joints.  Hip joints difficult to assess in seated position.  Left knee has slight limited extension with warmth.  Right knee has good range of motion no warmth or effusion.  Ankle joints have good range of motion no tenderness.  She has mild pedal edema bilaterally.  CDAI Exam: CDAI Score: 11.4  Patient Global: 8 mm; Provider Global: 6 mm Swollen: 5 ; Tender: 5  Joint Exam 08/21/2019      Right  Left  Elbow     Swollen Tender  Wrist  Swollen Tender     MCP 4     Swollen Tender  MCP 5     Swollen Tender  Knee     Swollen Tender     Investigation: No additional findings.  Imaging: DG Hand Complete Left  Result Date: 08/17/2019 CLINICAL DATA:  Pain and swelling.  Rheumatoid arthritis EXAM: LEFT HAND - COMPLETE 3+ VIEW COMPARISON:  June 22, 2016 FINDINGS: Frontal, oblique, and lateral views were obtained. Bones are diffusely osteoporotic. There is extensive erosive arthropathy in the third and fourth PIP joints. There is erosive arthropathy in the first and second MCP joints. There is subluxation in the third and fourth MCP joint regions with associated erosions. There is marked soft tissue swelling medial to the fifth distal metacarpal dorsally. There is advanced narrowing throughout the entire carpal region with multiple areas of erosion and  subchondral cystic change. Soft tissue swelling is noted involving the first, second, third, and fourth digits, primarily proximally. No acute fracture or frank dislocation. IMPRESSION: Multiple areas of soft tissue swelling, most severe along the dorsal aspect of the hand near the fifth metacarpal. Multiple digits also show soft tissue swelling. Bones are diffusely osteoporotic. There are multiple areas of erosion. There is essentially joint space narrowing at all levels, most severe throughout the wrist region. Subluxation at the fourth and third MCP joints noted. These are changes indicative of fairly advanced rheumatoid arthritis. There is probable secondary osteoarthritic change, particularly in the DIP joints. No acute fracture or frank dislocation. Electronically Signed   By: Lowella Grip III M.D.   On: 08/17/2019 10:25    Recent Labs: Lab Results  Component Value Date   WBC 6.1 09/21/2017   HGB 9.2 (A) 09/21/2017   PLT 171 09/21/2017   NA 139 02/09/2019   K 4.5 02/09/2019   CL 106 02/09/2019   CO2 26 02/09/2019   GLUCOSE 119 (H) 02/09/2019   BUN 33 (H) 02/09/2019   CREATININE 1.07 02/09/2019   BILITOT 0.9 08/26/2017   ALKPHOS 39 08/26/2017   AST 20 08/26/2017   ALT 11 (L) 08/26/2017   PROT 5.3 (L) 08/26/2017   ALBUMIN 2.8 (L) 08/26/2017   CALCIUM 9.1 02/09/2019   GFRAA 51 (L) 01/18/2018    Speciality Comments: No specialty comments available.  Procedures:  No procedures performed Allergies: Latex, Boniva [ibandronic acid], Darvocet [propoxyphene n-acetaminophen], Erythromycin, Morphine and related, Oxycontin [oxycodone hcl], Sulfa antibiotics, Tramadol, and Vicodin [hydrocodone-acetaminophen]   Assessment / Plan:     Visit Diagnoses: Rheumatoid arthritis of multiple sites with negative rheumatoid factor ([place]): She has tenderness and synovitis of the left fourth and fifth MCP joints, ulnar aspect of the right wrist, left elbow joint, and left knee joint.  She continues to  have chronic pain in both elbows, both hands, and both knee joints. She continues to have recurrent flares despite taking prednisone 5 mg po daily. She was evaluated in the emergency department on 08/17/2019 and was started on prednisone 20 mg by mouth daily for 4 days.  She completed the prescription of prednisone yesterday and took 10 mg of prednisone today.  She has been on long-term prednisone 5 mg by mouth daily for several years.  She is not taking any immunosuppressive agents at this time.  She is resistant to take sulfasalazine or Plaquenil due to potential side effects.  She would like to increase the dose of prednisone.  We discussed the long-term risks of prednisone  use.  She voiced understanding.  She will take prednisone 10 mg 1 tablet daily. A new prescription for Prednisone was sent to the pharmacy today.  She will follow-up in the office in 6 months.  Rheumatoid nodulosis (HCC)  High risk medication use - Prednisone 10 mg p.o. daily. She does not want to take SSZ or PLQ.  Primary osteoarthritis of both hands: She has chronic pain and stiffness in both hands.  Primary osteoarthritis of both feet: She is not experiencing any discomfort in her feet at this time.  Age-related osteoporosis without current pathological fracture: Dr. Brigitte Pulse orders her DEXA scans.  She does not want to be on medication for the management of osteoporosis. She is aware of the risks of long term prednisone use.   Other medical conditions are listed as follows:   History of lung cancer  History of COPD  Orders: No orders of the defined types were placed in this encounter.  Meds ordered this encounter  Medications  . predniSONE (DELTASONE) 10 MG tablet    Sig: Take 1 tablet (10 mg total) by mouth daily with breakfast.    Dispense:  90 tablet    Refill:  0     Follow-Up Instructions: Return in about 6 months (around 02/21/2020) for Rheumatoid arthritis, Osteoarthritis.   Hazel Sams, PA-C  I  examined and evaluated the patient with Hazel Sams PA. Patient has severe end-stage rheumatoid arthritis with synovitis and tenosynovitis. She just finished prednisone taper. She is not interested in any DMARDs or Biologics. She was accompanied by her daughter. Her daughter wants her to be on higher dose of prednisone that she is comfortable. Side effects of long-term use of prednisone were discussed. Prednisone dose was increased to 10 mg p.o. daily. The plan of care was discussed as noted above.  Bo Merino, MD  Note - This record has been created using Editor, commissioning.  Chart creation errors have been sought, but may not always  have been located. Such creation errors do not reflect on  the standard of medical care.

## 2019-08-17 ENCOUNTER — Emergency Department (HOSPITAL_COMMUNITY): Payer: Medicare Other

## 2019-08-17 ENCOUNTER — Emergency Department (HOSPITAL_COMMUNITY)
Admission: EM | Admit: 2019-08-17 | Discharge: 2019-08-17 | Disposition: A | Discharge status: Home / Self Care | Payer: Medicare Other | Attending: Emergency Medicine | Admitting: Emergency Medicine

## 2019-08-17 ENCOUNTER — Other Ambulatory Visit: Payer: Self-pay

## 2019-08-17 ENCOUNTER — Encounter (HOSPITAL_COMMUNITY): Payer: Self-pay | Admitting: Emergency Medicine

## 2019-08-17 DIAGNOSIS — M859 Disorder of bone density and structure, unspecified: Secondary | ICD-10-CM | POA: Insufficient documentation

## 2019-08-17 DIAGNOSIS — M069 Rheumatoid arthritis, unspecified: Secondary | ICD-10-CM | POA: Diagnosis not present

## 2019-08-17 DIAGNOSIS — M79642 Pain in left hand: Secondary | ICD-10-CM | POA: Diagnosis present

## 2019-08-17 MED ORDER — PREDNISONE 20 MG PO TABS: 20.0000 mg | ORAL_TABLET | Freq: Every day | ORAL | 0 refills | Status: DC

## 2019-08-17 NOTE — ED Provider Notes (Signed)
[facility] DEPT Provider Note   CSN: [id number] Arrival date & time: 08/17/19  0935     History Chief Complaint  Patient presents with  . Hand Pain    [name] is a 86 y.o. female.  HPI Patient presents with left hand pain.  States began around 4 in the morning.  Woke up with the pain.  Has a history of rheumatoid arthritis.  On 5 mg of prednisone a day.  States she took her steroids this morning did not help with the pain.  No fevers.  No known trauma.  No other pain.  Pain and swelling.  Feels most over the fourth and fifth MCP joint.    Past Medical History:  Diagnosis Date  . Cancer ([place])    lung ca  . COPD (chronic obstructive pulmonary disease) ([place])   . Hernia, umbilical   . Hip pain, right   . History of radiation therapy 05/02/12-05/04/12,&05/09/12   rul lung 54Gy/74fx  . HLD (hyperlipidemia)   . Lung mass   . Myocardial infarction ([place]) 08/2017  . Osteoporosis   . PAC (premature atrial contraction)   . RA (rheumatoid arthritis) ([place])   . Shingles     Patient Active Problem List   Diagnosis Date Noted  . Abnormal CT of the chest 09/01/2017  . Adult failure to thrive 09/01/2017  . Neurocognitive disorder 08/30/2017  . NSTEMI (non-ST elevated myocardial infarction) ([place]) 08/25/2017  . Rheumatoid nodulosis ([place]) 06/09/2016  . Primary osteoarthritis of both hands 06/09/2016  . Primary osteoarthritis of both feet 06/09/2016  . History of lung cancer 06/09/2016  . High risk medication use 06/09/2016  . Aortic calcification ([place]) 05/10/2013  . Coronary artery calcification 05/10/2013  . Protein-calorie malnutrition, severe ([place]) 12/25/2012  . Depression, recurrent ([place]) 12/25/2012  . Anemia of chronic disease 12/24/2012  . Thrombocytopenia, unspecified 12/24/2012  . Aphthous stomatitis 12/24/2012  . Bleeding from nasopharynx 12/24/2012  . Cellulitis 12/23/2012  . Hemoptysis? Secondary to either irritation by dentures, vs.  secondary to lung neoplasm 12/23/2012  . Rheumatoid arthritis-severe 12/23/2012  . COPD, mild ([place]) 12/23/2012  . Osteoporosis 12/23/2012  . Primary cancer of right upper lobe of lung ([place]) 03/21/2012    Past Surgical History:  Procedure Laterality Date  . LEFT HEART CATH AND CORONARY ANGIOGRAPHY N/A 08/26/2017   Procedure: LEFT HEART CATH AND CORONARY ANGIOGRAPHY;  Surgeon: Martinique, Peter M, MD;  Location: [facility];  Service: Cardiovascular;  Laterality: N/A;  . right lobectomy       OB History   No obstetric history on file.     Family History  Problem Relation Age of Onset  . Cancer Daughter   . Cancer Mother     Social History   Tobacco Use  . Smoking status: Former Smoker    Types: Cigarettes    Quit date: 1995    Years since quitting: 26.3  . Smokeless tobacco: Never Used  Substance Use Topics  . Alcohol use: No  . Drug use: Never    Home Medications Prior to Admission medications   Medication Sig Start Date End Date Taking? Authorizing Provider  acetaminophen (TYLENOL) 325 MG tablet Take 650 mg by mouth every 8 (eight) hours as needed.   Yes [provider]  carvedilol (COREG) 3.125 MG tablet Take 1 tablet (3.125 mg total) by mouth 2 (two) times daily with a meal. 10/14/17  Yes Medina-Vargas, Monina C, NP  furosemide (LASIX) 20 MG tablet Take 20 mg  by mouth every morning. 01/31/19  Yes [provider]  ipratropium-albuterol (DUONEB) 0.5-2.5 (3) MG/3ML SOLN Take 3 mLs by nebulization every 4 (four) hours as needed (wheezing, shortness of breath). Dx: J44.9 02/20/18  Yes Mannam, Hart Robinsons, MD  Multiple Vitamins-Minerals (ALIVE ONCE DAILY WOMENS) TABS Take 1 tablet by mouth daily.   Yes [provider]  predniSONE (DELTASONE) 5 MG tablet TAKE 1 TABLET(5 MG) BY MOUTH DAILY WITH BREAKFAST Patient taking differently: Take 5 mg by mouth daily with breakfast.  07/25/19  Yes Deveshwar, Abel Presto, MD  predniSONE (DELTASONE) 20 MG tablet Take 1  tablet (20 mg total) by mouth daily. 08/17/19   Davonna Belling, MD    Allergies    Latex, Boniva [ibandronic acid], Darvocet [propoxyphene n-acetaminophen], Erythromycin, Morphine and related, Oxycontin [oxycodone hcl], Sulfa antibiotics, Tramadol, and Vicodin [hydrocodone-acetaminophen]  Review of Systems   Review of Systems  Constitutional: Negative for appetite change.  HENT: Negative for congestion.   Musculoskeletal:       Left hand pain  Skin: Negative for rash.  Neurological: Negative for weakness and numbness.  Psychiatric/Behavioral: Negative for confusion.    Physical Exam Updated Vital Signs BP (!) 153/59 (BP Location: Right Arm)   Pulse 62   Temp 98.3 F (36.8 C) (Oral)   Resp 16   Ht 4\' 11"  (1.499 m)   Wt 44 kg   SpO2 94%   BMI 19.59 kg/m   Physical Exam Vitals and nursing note reviewed.  HENT:     Head: Normocephalic.  Musculoskeletal:     Comments: Tenderness and swelling over the left fourth and fifth MCP joint.  No real erythema but does have some mild warmth.  Mildly decreased range of motion due to swelling.  Chronic changes on hands due to rheumatoid arthritis.  No swelling proximal but does have some swelling in the fingers throughout the hand.  Skin:    General: Skin is warm.     Capillary Refill: Capillary refill takes less than 2 seconds.  Neurological:     Mental Status: She is alert.     ED Results / Procedures / Treatments   Labs (all labs ordered are listed, but only abnormal results are displayed) Labs Reviewed - No data to display  EKG None  Radiology DG Hand Complete Left  Result Date: 08/17/2019 CLINICAL DATA:  Pain and swelling.  Rheumatoid arthritis EXAM: LEFT HAND - COMPLETE 3+ VIEW COMPARISON:  June 22, 2016 FINDINGS: Frontal, oblique, and lateral views were obtained. Bones are diffusely osteoporotic. There is extensive erosive arthropathy in the third and fourth PIP joints. There is erosive arthropathy in the first and  second MCP joints. There is subluxation in the third and fourth MCP joint regions with associated erosions. There is marked soft tissue swelling medial to the fifth distal metacarpal dorsally. There is advanced narrowing throughout the entire carpal region with multiple areas of erosion and subchondral cystic change. Soft tissue swelling is noted involving the first, second, third, and fourth digits, primarily proximally. No acute fracture or frank dislocation. IMPRESSION: Multiple areas of soft tissue swelling, most severe along the dorsal aspect of the hand near the fifth metacarpal. Multiple digits also show soft tissue swelling. Bones are diffusely osteoporotic. There are multiple areas of erosion. There is essentially joint space narrowing at all levels, most severe throughout the wrist region. Subluxation at the fourth and third MCP joints noted. These are changes indicative of fairly advanced rheumatoid arthritis. There is probable secondary osteoarthritic change, particularly in the  DIP joints. No acute fracture or frank dislocation. Electronically Signed   By: Lowella Grip III M.D.   On: 08/17/2019 10:25    Procedures Procedures (including critical care time)  Medications Ordered in ED Medications - No data to display  ED Course  I have reviewed the triage vital signs and the nursing notes.  Pertinent labs & imaging results that were available during my care of the patient were reviewed by me and considered in my medical decision making (see chart for details).    MDM Rules/Calculators/A&P                      Patient with swelling of the fourth and fifth metacarpal joints.  No trauma.  X-ray abnormal but reassuring.  Doubt infection.  Potential of occult fracture due to osteoporosis but no known trauma.  Will splint for comfort.  Have follow-up with her rheumatologist.  Will give a 4-day course of increased steroids.  Can discuss with Dr. Estanislado Pandy for further dosing. Final Clinical  Impression(s) / ED Diagnoses Final diagnoses:  Rheumatoid arthritis flare ([place])    Rx / DC Orders ED Discharge Orders         Ordered    predniSONE (DELTASONE) 20 MG tablet  Daily     08/17/19 1052           Davonna Belling, MD 08/17/19 1055

## 2019-08-17 NOTE — Discharge Instructions (Signed)
Take the 20 mg tabs for the next 4 days.  Follow-up with Dr. Estanislado Pandy.

## 2019-08-17 NOTE — ED Triage Notes (Signed)
Patient complaining of L forearm pain since last night. Denies injury, no recent falls. Patient is not complaining of any other symptoms at this time. Hx of arthritis. Uses walker at home.    170/68 HR 70 95% RA AOx4

## 2019-08-19 NOTE — Progress Notes (Addendum)
Subjective: [name] presents today for follow up of painful plantar lesions b/l feet.  Pain prevent comfortable ambulation. Aggravating factor is weightbearing with or without shoegear.   Her daughter is present during the visit.   She voices no new pedal concerns on today's visit.  Allergies  Allergen Reactions  . Latex Other (See Comments)    Blisters on her mouth  . Boniva [Ibandronic Acid]     Muscle aches  . Darvocet [Propoxyphene N-Acetaminophen] Nausea And Vomiting  . Erythromycin Nausea And Vomiting  . Morphine And Related Nausea And Vomiting  . Oxycontin [Oxycodone Hcl] Nausea And Vomiting  . Sulfa Antibiotics Nausea And Vomiting  . Tramadol Nausea And Vomiting  . Vicodin [Hydrocodone-Acetaminophen]     dizzy     Objective: There were no vitals filed for this visit.  Pt 86 y.o. year old female  in NAD. AAO x 3.   Vascular Examination:  Capillary refill time to digits immediate b/l. Palpable DP pulses b/l. Palpable PT pulses b/l. Pedal hair present b/l. Skin temperature gradient within normal limits b/l. No edema noted b/l.  Dermatological Examination: Pedal skin with normal turgor, texture and tone bilaterally. No interdigital macerations bilaterally. Toenails 1-5 b/l elongated, dystrophic, thickened, crumbly with subungual debris and tenderness to dorsal palpation.   Hyperkeratotic lesion(s) submet head 3 right foot.  No erythema, no edema, no drainage, no flocculence.   Porokeratotic lesion(s) submet head 2 left foot with tenderness to palpation. There is visible subdermal hemorrhage. No erythema, no edema, no drainage, no flocculence. No surrounding warmth.  Musculoskeletal: Normal muscle strength 5/5 to all lower extremity muscle groups bilaterally. No pain crepitus or joint limitation noted with ROM b/l. Hammertoes noted to the 1-5 bilaterally.  Neurological: Protective sensation intact 5/5 intact bilaterally with 10g monofilament b/l. Vibratory  sensation intact b/l.  Assessment: 1. Pain due to onychomycosis of toenails of both feet   2. Pre-ulcerative calluses   3. Rheumatoid arthritis involving multiple sites with positive rheumatoid factor (HCC)   4. Pain in both feet    Plan: -Toenails 1-5 b/l were debrided in length and girth with sterile nail nippers and dremel without iatrogenic bleeding.  -Callus(es) submet head 3 right foot pared utilizing sterile scalpel blade without complication or incident. Total number debrided =1. -Painful porokeratotic lesion(s) submet head 2 left foot pared and enucleated with sterile scalpel blade without incident. -Patient to continue soft, supportive shoe gear daily. -Patient to report any pedal injuries to medical professional immediately. -Patient/POA to call should there be question/concern in the interim.  Return in about 10 weeks (around 10/23/2019) for nail and callus trim.

## 2019-08-21 ENCOUNTER — Ambulatory Visit (INDEPENDENT_AMBULATORY_CARE_PROVIDER_SITE_OTHER): Payer: Medicare Other | Admitting: Rheumatology

## 2019-08-21 ENCOUNTER — Encounter: Payer: Self-pay | Admitting: Rheumatology

## 2019-08-21 ENCOUNTER — Other Ambulatory Visit: Payer: Self-pay

## 2019-08-21 VITALS — BP 148/63 | HR 63 | Resp 18 | Ht <= 58 in | Wt 98.2 lb

## 2019-08-21 DIAGNOSIS — M19071 Primary osteoarthritis, right ankle and foot: Secondary | ICD-10-CM

## 2019-08-21 DIAGNOSIS — Z85118 Personal history of other malignant neoplasm of bronchus and lung: Secondary | ICD-10-CM

## 2019-08-21 DIAGNOSIS — Z79899 Other long term (current) drug therapy: Secondary | ICD-10-CM | POA: Diagnosis not present

## 2019-08-21 DIAGNOSIS — M0630 Rheumatoid nodule, unspecified site: Secondary | ICD-10-CM

## 2019-08-21 DIAGNOSIS — M0609 Rheumatoid arthritis without rheumatoid factor, multiple sites: Secondary | ICD-10-CM

## 2019-08-21 DIAGNOSIS — M19041 Primary osteoarthritis, right hand: Secondary | ICD-10-CM | POA: Diagnosis not present

## 2019-08-21 DIAGNOSIS — Z8709 Personal history of other diseases of the respiratory system: Secondary | ICD-10-CM

## 2019-08-21 DIAGNOSIS — M810 Age-related osteoporosis without current pathological fracture: Secondary | ICD-10-CM

## 2019-08-21 DIAGNOSIS — M19072 Primary osteoarthritis, left ankle and foot: Secondary | ICD-10-CM

## 2019-08-21 DIAGNOSIS — M19042 Primary osteoarthritis, left hand: Secondary | ICD-10-CM

## 2019-08-21 MED ORDER — PREDNISONE 10 MG PO TABS
10.0000 mg | ORAL_TABLET | Freq: Every day | ORAL | 0 refills | Status: DC
Start: 2019-08-21 — End: 2019-11-19

## 2019-10-31 ENCOUNTER — Encounter: Payer: Self-pay | Admitting: Podiatry

## 2019-10-31 ENCOUNTER — Ambulatory Visit (INDEPENDENT_AMBULATORY_CARE_PROVIDER_SITE_OTHER): Payer: Medicare Other | Admitting: Podiatry

## 2019-10-31 ENCOUNTER — Other Ambulatory Visit: Payer: Self-pay

## 2019-10-31 DIAGNOSIS — M79674 Pain in right toe(s): Secondary | ICD-10-CM

## 2019-10-31 DIAGNOSIS — M79672 Pain in left foot: Secondary | ICD-10-CM

## 2019-10-31 DIAGNOSIS — Q828 Other specified congenital malformations of skin: Secondary | ICD-10-CM | POA: Diagnosis not present

## 2019-10-31 DIAGNOSIS — M79671 Pain in right foot: Secondary | ICD-10-CM | POA: Diagnosis not present

## 2019-10-31 DIAGNOSIS — M79675 Pain in left toe(s): Secondary | ICD-10-CM | POA: Diagnosis not present

## 2019-10-31 DIAGNOSIS — L84 Corns and callosities: Secondary | ICD-10-CM | POA: Diagnosis not present

## 2019-10-31 DIAGNOSIS — M0579 Rheumatoid arthritis with rheumatoid factor of multiple sites without organ or systems involvement: Secondary | ICD-10-CM

## 2019-10-31 DIAGNOSIS — B351 Tinea unguium: Secondary | ICD-10-CM

## 2019-11-03 NOTE — Patient Instructions (Signed)

## 2019-11-03 NOTE — Progress Notes (Signed)
Subjective: [name] presents today for follow up of painful plantar lesions b/l feet.  Pain prevent comfortable ambulation. Aggravating factor is weightbearing with or without shoegear.   Her daughter, [name], is present during today's visit. [name] is asking about information about edema and what causes it.   Allergies  Allergen Reactions  . Latex Other (See Comments)    Blisters on her mouth  . Boniva [Ibandronic Acid]     Muscle aches  . Darvocet [Propoxyphene N-Acetaminophen] Nausea And Vomiting  . Erythromycin Nausea And Vomiting  . Morphine And Related Nausea And Vomiting  . Oxycontin [Oxycodone Hcl] Nausea And Vomiting  . Sulfa Antibiotics Nausea And Vomiting  . Tramadol Nausea And Vomiting  . Vicodin [Hydrocodone-Acetaminophen]     dizzy     Objective: Physical examination unchanged on today's visit. There were no vitals filed for this visit.  Pt 86 y.o. year old female  in NAD. AAO x 3.   Vascular Examination:  Capillary refill time to digits immediate b/l. Palpable DP pulses b/l. Palpable PT pulses b/l. Pedal hair present b/l. Skin temperature gradient within normal limits b/l. No edema noted b/l.  Dermatological Examination: Pedal skin with normal turgor, texture and tone bilaterally. No interdigital macerations bilaterally. Toenails 1-5 b/l elongated, dystrophic, thickened, crumbly with subungual debris and tenderness to dorsal palpation.   Hyperkeratotic lesion(s) submet head 3 right foot.  No erythema, no edema, no drainage, no flocculence.   Porokeratotic lesion(s) submet head 2 left foot with tenderness to palpation. There is visible subdermal hemorrhage. No erythema, no edema, no drainage, no flocculence. No surrounding warmth.  Musculoskeletal: Normal muscle strength 5/5 to all lower extremity muscle groups bilaterally. No pain crepitus or joint limitation noted with ROM b/l. Hammertoes noted to the 1-5 bilaterally.  Neurological: Protective sensation  intact 5/5 intact bilaterally with 10g monofilament b/l. Vibratory sensation intact b/l.  Assessment: 1. Pain due to onychomycosis of toenails of both feet   2. Callus   3. Porokeratosis   4. Pain in both feet   5. Rheumatoid arthritis involving multiple sites with positive rheumatoid factor ([place])    Plan: -We did discuss different conditions which could cause edema on today's visit. -Toenails 1-5 b/l were debrided in length and girth with sterile nail nippers and dremel without iatrogenic bleeding.  -Callus(es) submet head 3 right foot pared utilizing sterile scalpel blade without complication or incident. Total number debrided =1. -Painful porokeratotic lesion(s) submet head 2 left foot pared and enucleated with sterile scalpel blade without incident. Total porokeratotic lesions pared and enucleated=1.  -Patient to continue soft, supportive shoe gear daily. -Patient to report any pedal injuries to medical professional immediately. -Patient/POA to call should there be question/concern in the interim.  Return in about 3 months (around 01/31/2020) for nail and callus trim.

## 2019-11-19 ENCOUNTER — Telehealth: Payer: Self-pay | Admitting: Rheumatology

## 2019-11-19 MED ORDER — PREDNISONE 10 MG PO TABS: 10.0000 mg | ORAL_TABLET | Freq: Every day | ORAL | 0 refills | Status: DC

## 2019-11-19 NOTE — Telephone Encounter (Signed)
Last Visit: 08/21/2019 Next Visit: 02/18/2020  Current Dose per office note on 08/21/2019: Prednisone 10 mg p.o. daily  Okay to refill per Dr. Estanislado Pandy

## 2019-11-19 NOTE — Telephone Encounter (Signed)
Patient called requesting prescription refill of Prednisone 10 mg tablet 90 day supply to be sent to [facility] at [place].  Patient is requesting the prescription be sent today if possible so her daughter can pick it up for her.

## 2020-01-23 ENCOUNTER — Encounter (INDEPENDENT_AMBULATORY_CARE_PROVIDER_SITE_OTHER): Payer: Medicare Other | Admitting: Ophthalmology

## 2020-01-25 ENCOUNTER — Ambulatory Visit (INDEPENDENT_AMBULATORY_CARE_PROVIDER_SITE_OTHER): Payer: Medicare Other

## 2020-01-25 ENCOUNTER — Encounter: Payer: Self-pay | Admitting: Podiatry

## 2020-01-25 ENCOUNTER — Ambulatory Visit (INDEPENDENT_AMBULATORY_CARE_PROVIDER_SITE_OTHER): Payer: Medicare Other | Admitting: Podiatry

## 2020-01-25 ENCOUNTER — Other Ambulatory Visit: Payer: Self-pay

## 2020-01-25 ENCOUNTER — Other Ambulatory Visit: Payer: Self-pay | Admitting: Podiatry

## 2020-01-25 DIAGNOSIS — R52 Pain, unspecified: Secondary | ICD-10-CM

## 2020-01-25 DIAGNOSIS — L97521 Non-pressure chronic ulcer of other part of left foot limited to breakdown of skin: Secondary | ICD-10-CM

## 2020-01-25 DIAGNOSIS — B351 Tinea unguium: Secondary | ICD-10-CM

## 2020-01-25 DIAGNOSIS — M0579 Rheumatoid arthritis with rheumatoid factor of multiple sites without organ or systems involvement: Secondary | ICD-10-CM

## 2020-01-25 DIAGNOSIS — L84 Corns and callosities: Secondary | ICD-10-CM

## 2020-01-25 DIAGNOSIS — M79674 Pain in right toe(s): Secondary | ICD-10-CM

## 2020-01-25 DIAGNOSIS — M79675 Pain in left toe(s): Secondary | ICD-10-CM | POA: Diagnosis not present

## 2020-01-25 DIAGNOSIS — L97522 Non-pressure chronic ulcer of other part of left foot with fat layer exposed: Secondary | ICD-10-CM | POA: Diagnosis not present

## 2020-01-26 NOTE — Progress Notes (Signed)
Subjective: [name] presents today for follow up of painful plantar lesions b/l feet.  Pain prevent comfortable ambulation. Aggravating factor is weightbearing with or without shoegear.   Her daughter, [name], is present during today's visit. Ms. Stoermer states she is having foot pain in left foot where she has chronic callosities due to severe foot deformities secondary to RA.   She denies any drainage or swelling. Denies any fever, chills, night sweats, nausea or vomiting.  Allergies  Allergen Reactions  . Latex Other (See Comments)    Blisters on her mouth  . Boniva [Ibandronic Acid]     Muscle aches  . Darvocet [Propoxyphene N-Acetaminophen] Nausea And Vomiting  . Erythromycin Nausea And Vomiting  . Morphine And Related Nausea And Vomiting  . Oxycontin [Oxycodone Hcl] Nausea And Vomiting  . Sulfa Antibiotics Nausea And Vomiting  . Tramadol Nausea And Vomiting  . Vicodin [Hydrocodone-Acetaminophen]     dizzy     Objective: Physical examination unchanged on today's visit. There were no vitals filed for this visit.  Pt is a pleasant 86 y.o. year old Caucasian female in NAD. AAO x 3.  Vascular Examination:  Capillary refill time to digits immediate b/l. Palpable DP pulses b/l. Palpable PT pulses b/l. Pedal hair present b/l. Skin temperature gradient within normal limits b/l. No edema noted b/l.  Dermatological Examination: Pedal skin with normal turgor, texture and tone bilaterally. No interdigital macerations bilaterally. Toenails 1-5 b/l elongated, dystrophic, thickened, crumbly with subungual debris and tenderness to dorsal palpation.     Wound Location: submet head 3 left foot with hyperkeratotic roof, subdermal hemorrhage and fluctuance There is a small amount of devitalized tissue present in the wound. Predebridement Wound Measurement:  1.5  x 1.5 cm. Postdebridement Wound Measurement: 1.5 x 0.5  x 0.1 cm. Wound Base: Granular/Healthy Peri-wound: Normal Exudate:  Scant/small amount Bloody exudate Blood Loss during debridement: <1/2 cc('s). Material in wound which inhibits healing/promotes adjacent tissue breakdown:  nonviable hyperkeratosis. Description of tissue removed from ulceration today:  nonviable hyperkeratosis. Sign(s) of clinical bacterial infection: no clinical signs of infection noted on examination today.  Hyperkeratotic lesion(s) submet head 3 right foot.  No erythema, no edema, no drainage, no flocculence.   Porokeratotic lesion(s) submet head 2 left foot with tenderness to palpation. There is visible subdermal hemorrhage. No erythema, no edema, no drainage, no fluctuance noted.  No surrounding warmth.  Musculoskeletal: Normal muscle strength 5/5 to all lower extremity muscle groups bilaterally. No pain crepitus or joint limitation noted with ROM b/l. Hammertoes noted to the 1-5 bilaterally. Utilizes wheelchair for mobility assistance.  Neurological: Protective sensation intact 5/5 intact bilaterally with 10g monofilament b/l. Vibratory sensation intact b/l.   Xrays left foot: No gas in tissues Noted demineralization of bones throughout left foot Severe hammertoe deformity Severe bunion deformity with dislocation at MPJ Fibular deviation of digits at MPJ consistent with longstanding RA Erosion of MPJs 1-5 and midfoot consistent with longstanding RA  Assessment: 1. Pain due to onychomycosis of toenails of both feet   2. Pain   3. Ulcer of left foot with fat layer exposed ([place])   4. Rheumatoid arthritis involving multiple sites with positive rheumatoid factor ([place])    Plan: -Examined patient. -Patient was evaluated and treated and all questions answered.  -Patient/Daughter educated on diagnosis and treatment plan of routine ulcer debridement/wound care.  -Ulceration debridement achieved utilizing sharp excisional debridement with sterile scalpel blade.. Type/amount of devitalized tissue removed: nonviable  hyperkeratosis. -Today's ulcer size post-debridement: 1.5 x  0.5 x 0.1 cm. -Ulceration cleansed with wound cleanser. Iodosorb Gel applied to base of ulceration and secured with light dressing. -Wound responded well to today's debridement. -Patient risk factors affecting healing of ulcer: foot deformity, immunosuppressive medications, loss of plantar fat pad, Rheumatoid arthritis. -Daughter [name] given verbal instructions on daily wound care for submet head 3 left foot ulceration. She is to change dressing on Sunday. -Ms. Huesman is an established client of [facility]. Orders for Home Health Wound care to be faxed to: [facility] for ulceration submet head 3 left foot -Dispensed Darco shoe with [employer] for offloading of ulcer for left foot. -Frequency of debridements needed to achieve healing: every 2 weeks. -Radiology ordered today: X-Ray: 3 views left foot. -Toenails 1-5 b/l were debrided in length and girth with sterile nail nippers and dremel without iatrogenic bleeding.  -Callus(es)  pared submetatarsal head(s) 2 left foot utilizing sterile scalpel blade without incident. -Patient to report any pedal injuries to medical professional immediately. -Patient to continue soft, supportive shoe gear daily. -Patient/POA to call should there be question/concern in the interim.  Return in about 2 weeks (around 02/08/2020) for 2 weeks w/Dr. Sherryle Lis for follow up ulcer submet 3 L ulcer & 9 weeks with me for at-risk foot care.

## 2020-01-28 IMAGING — CT CT CHEST W/ CM
2 of 3 series · 15 of 36 positions shown, 18 images · IV contrast (iopamidol)
Comparison: 01/07/2017 and older studies dating back to 05/07/2013

CLINICAL DATA: Followup left lower lobe pulmonary nodule. Right
upper lobe lung carcinoma status post radiation therapy.

EXAM:
CT CHEST WITH CONTRAST
TECHNIQUE: Multidetector CT imaging of the chest was performed during
intravenous contrast administration.
CONTRAST:  75mL [id number] IOPAMIDOL ([id number]) INJECTION 61%

[Series 2: axial st · axial · 0.62mm/px · z∈[-314,-52]mm · 12 of 155 slices shown, 15 images]
[im 12/155  mediastinal]
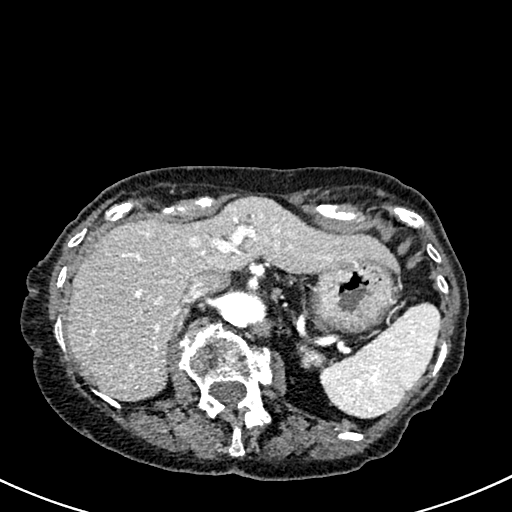
[im 12/155  lung]
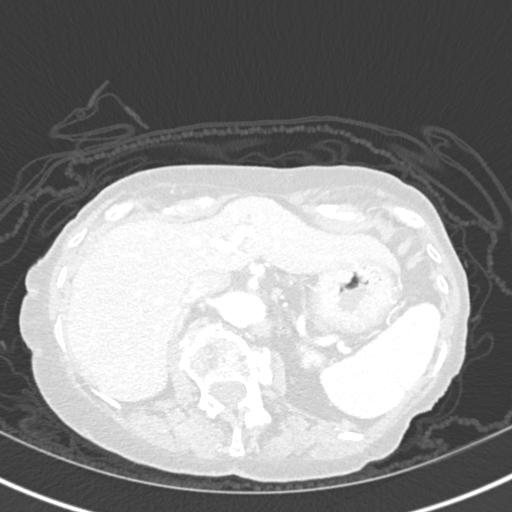
[im 23/155  lung]
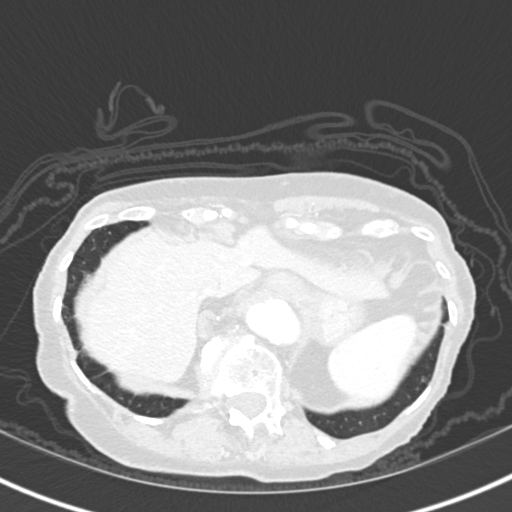
[im 35/155  lung]
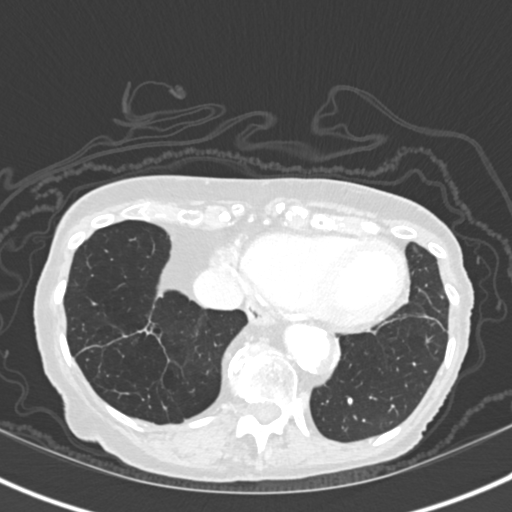
[im 46/155  lung]
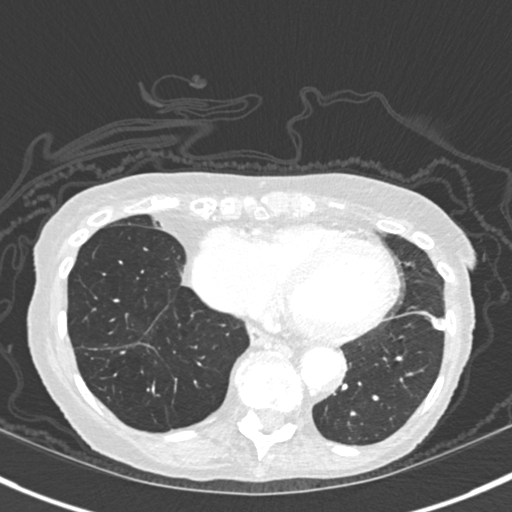
[im 58/155  mediastinal]
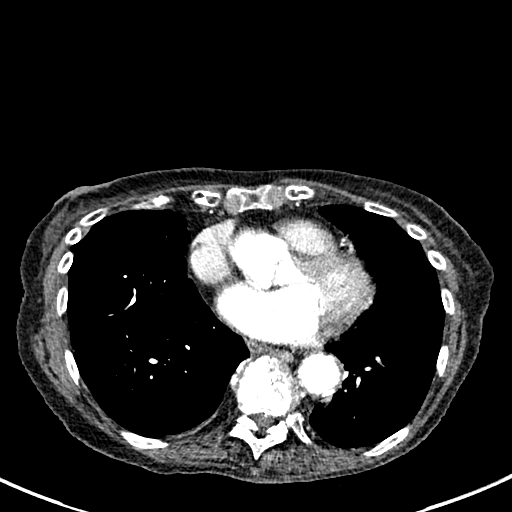
[im 58/155  lung]
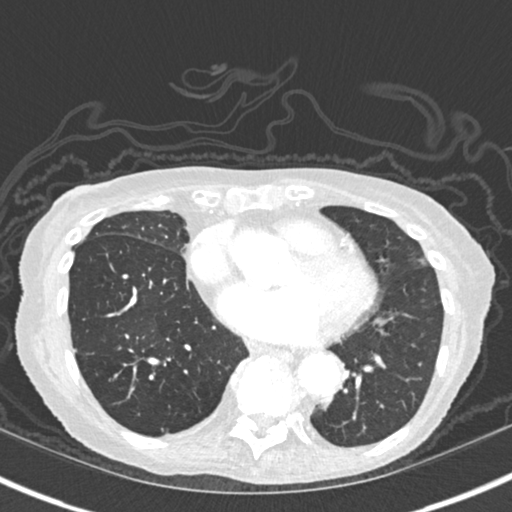
[im 69/155  lung]
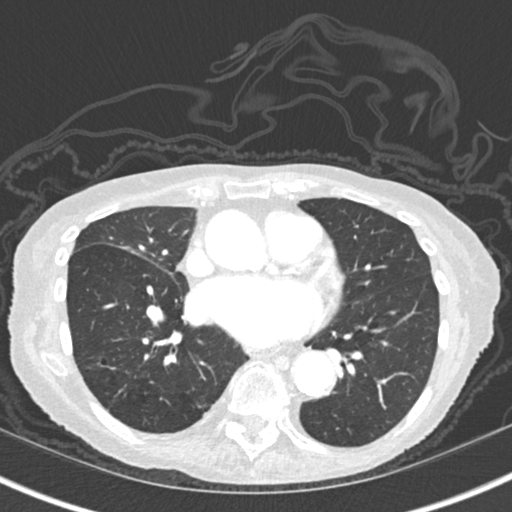
[im 86/155  lung]
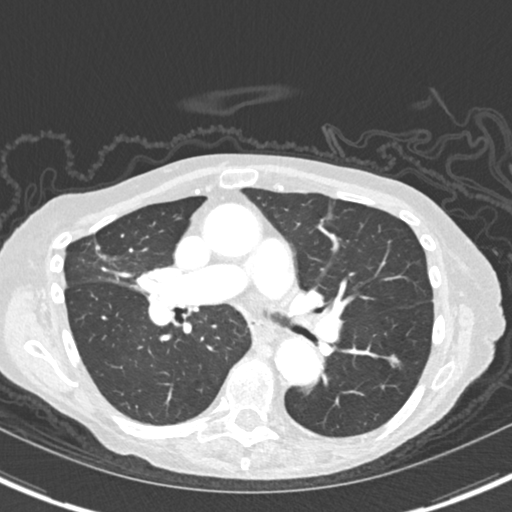
[im 97/155  lung]
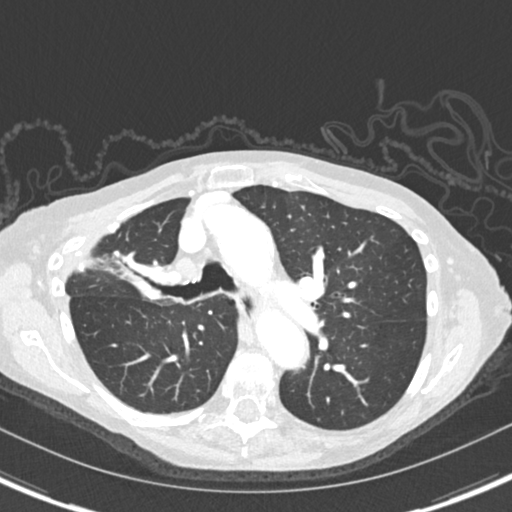
[im 109/155  mediastinal]
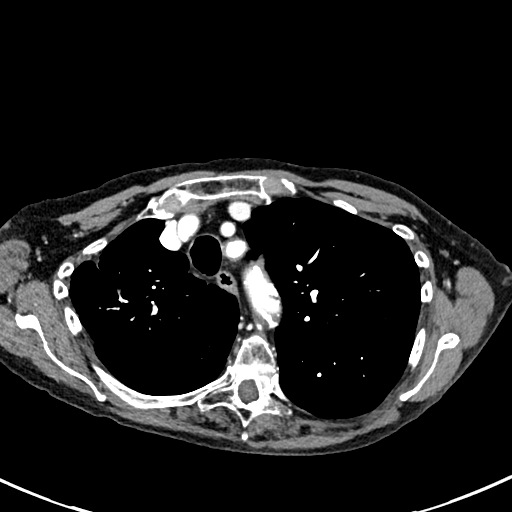
[im 109/155  lung]
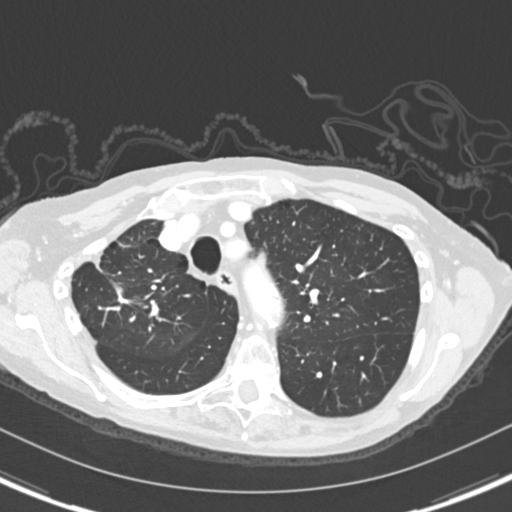
[im 120/155  lung]
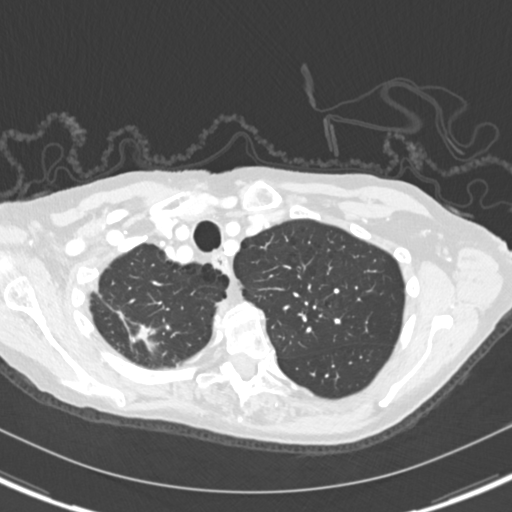
[im 132/155  lung]
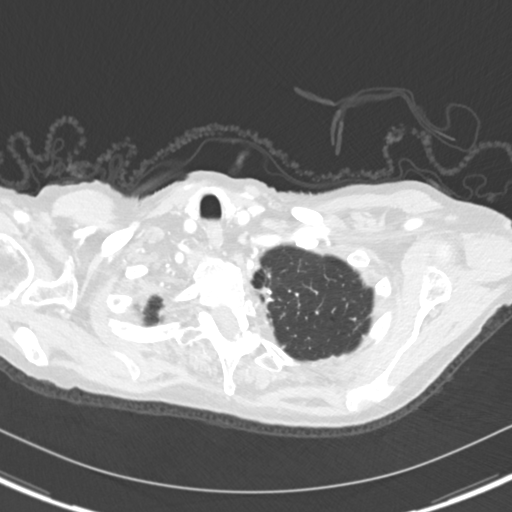
[im 143/155  lung]
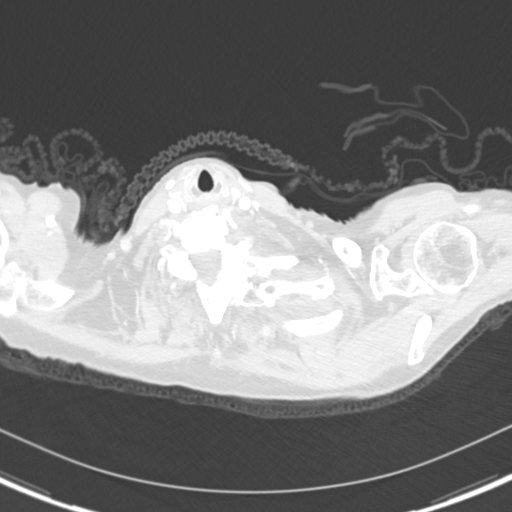

[Series 6: coronal · coronal · 0.64mm/px · 3 of 108 slices shown]
[im 22/108  lung]
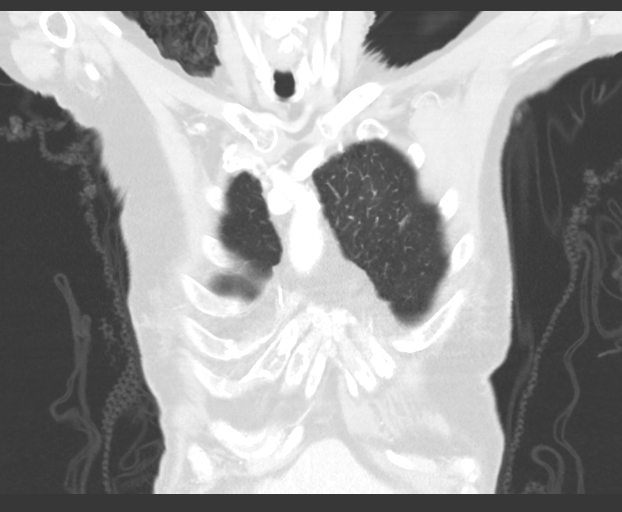
[im 43/108  lung]
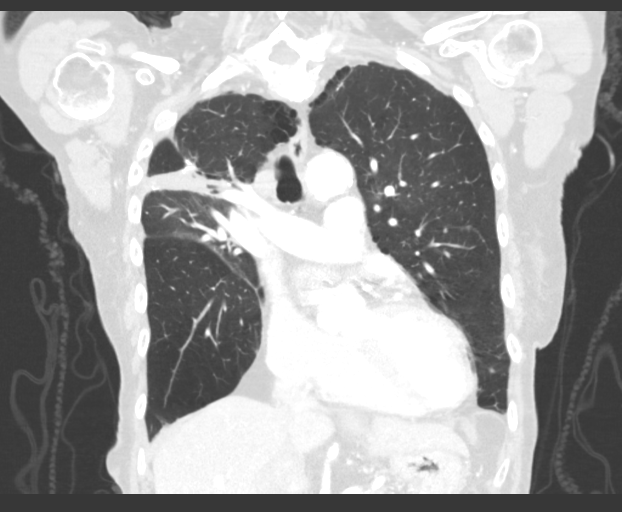
[im 65/108  lung]
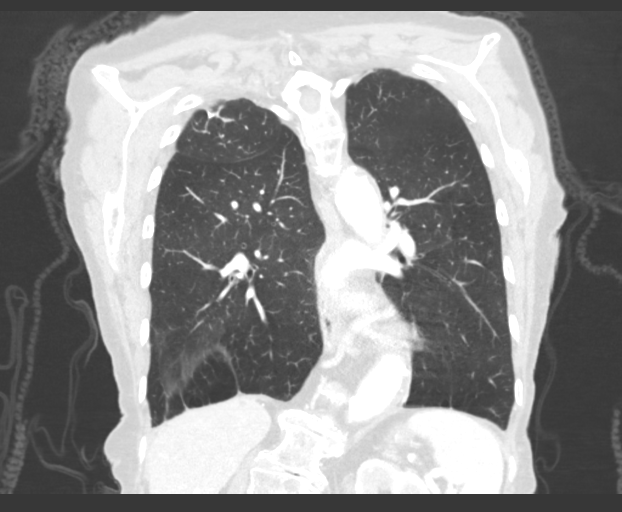

[15 of 36 positions shown; findings below may reference images not displayed]

FINDINGS: Cardiovascular: No acute findings. Aortic and coronary artery
atherosclerosis.

Mediastinum/Nodes: No masses or pathologically enlarged lymph nodes
identified.

Lungs/Pleura: Stable posttreatment changes and associated
bronchiectasis in the right upper lobe. Left lower lobe pulmonary
nodule measuring 6 x 12 mm on image 71/5 is also stable compared to
previous studies. No new or enlarging pulmonary nodules or masses
identified. No evidence of the acute infiltrate or pleural effusion.

Upper Abdomen:  Unremarkable.

Musculoskeletal:  No suspicious bone lesions.
IMPRESSION: Stable left lower lobe pulmonary nodule, consistent with benign
etiology.

Stable posttreatment changes in right upper lobe. No evidence of
recurrent or metastatic carcinoma.

## 2020-02-04 NOTE — Progress Notes (Signed)
Office Visit Note  Patient: [name]             Date of Birth: 1929/07/16           MRN: [id number]             PCP: Mayra Neer, MD Referring: Mayra Neer, MD Visit Date: 02/18/2020 Occupation: @GUAROCC @  Subjective:  Medication monitoring   History of Present Illness: [name] is a 86 y.o. female with history of seronegative rheumatoid arthritis, osteoarthritis, and osteoporosis.  She is on long term prednisone 10 mg daily.  She is not taking any other immunosuppressive agents at this time. She denies any increased joint pain or joint swelling recently. She denies any recent infections. She denies any recent falls or fractures. She typically uses a walker to assist with ambulation or a wheelchair for long distances.  She has no new questions or concerns at this time.   She agreed to have Dr. Brigitte Pulse forward her routine lab work to our office whenever it is updated.    Activities of Daily Living:  Patient reports joint stiffness all day  Patient Denies nocturnal pain.  Difficulty dressing/grooming: Reports Difficulty climbing stairs: Reports Difficulty getting out of chair: Reports Difficulty using hands for taps, buttons, cutlery, and/or writing: Reports  Review of Systems  Constitutional: Negative for fatigue.  HENT: Negative for mouth sores, mouth dryness and nose dryness.   Eyes: Negative for pain, visual disturbance and dryness.  Respiratory: Negative for cough, hemoptysis, shortness of breath and difficulty breathing.   Cardiovascular: Positive for swelling in legs/feet. Negative for chest pain, palpitations and hypertension.  Gastrointestinal: Negative for blood in stool, constipation and diarrhea.  Endocrine: Positive for increased urination.  Genitourinary: Negative for difficulty urinating and painful urination.  Musculoskeletal: Positive for arthralgias, gait problem, joint pain, joint swelling and muscle weakness. Negative for myalgias, morning  stiffness, muscle tenderness and myalgias.  Skin: Negative for color change, pallor, rash, hair loss, nodules/bumps, skin tightness, ulcers and sensitivity to sunlight.  Allergic/Immunologic: Negative for susceptible to infections.  Neurological: Negative for dizziness, numbness and headaches.  Hematological: Positive for bruising/bleeding tendency. Negative for swollen glands.  Psychiatric/Behavioral: Positive for sleep disturbance. Negative for depressed mood. The patient is not nervous/anxious.     PMFS History:  Patient Active Problem List   Diagnosis Date Noted  . Abnormal CT of the chest 09/01/2017  . Adult failure to thrive 09/01/2017  . Neurocognitive disorder 08/30/2017  . NSTEMI (non-ST elevated myocardial infarction) ([place]) 08/25/2017  . 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 Primary osteoarthritis of both hands 06/09/2016  . Primary osteoarthritis of both feet 06/09/2016  . History of lung cancer 06/09/2016  . High risk medication use 06/09/2016  . Aortic calcification ([place]) 05/10/2013  . Coronary artery calcification 05/10/2013  . Protein-calorie malnutrition, severe ([place]) 12/25/2012  . Depression, recurrent ([place]) 12/25/2012  . Anemia of chronic disease 12/24/2012  . Thrombocytopenia, unspecified 12/24/2012  . Aphthous stomatitis 12/24/2012  . Bleeding from nasopharynx 12/24/2012  . Cellulitis 12/23/2012  . Hemoptysis? Secondary to either irritation by dentures, vs. secondary to lung neoplasm 12/23/2012  . Rheumatoid arthritis-severe 12/23/2012  . COPD, mild ([place]) 12/23/2012  . Osteoporosis 12/23/2012  . Primary cancer of right upper lobe of lung ([place]) 03/21/2012    Past Medical History:  Diagnosis Date  . Cancer ([place])    lung ca  . COPD (chronic obstructive pulmonary disease) ([place])   . Hernia, umbilical   . Hip pain, right   .  History of radiation therapy 05/02/12-05/04/12,&05/09/12   rul lung 54Gy/66fx  . HLD (hyperlipidemia)   . Lung mass   . Myocardial  infarction ([place]) 08/2017  . Osteoporosis   . PAC (premature atrial contraction)   . RA (rheumatoid arthritis) ([place])   . Shingles     Family History  Problem Relation Age of Onset  . Cancer Daughter   . Cancer Mother    Past Surgical History:  Procedure Laterality Date  . LEFT HEART CATH AND CORONARY ANGIOGRAPHY N/A 08/26/2017   Procedure: LEFT HEART CATH AND CORONARY ANGIOGRAPHY;  Surgeon: Martinique, Peter M, MD;  Location: [facility];  Service: Cardiovascular;  Laterality: N/A;  . right lobectomy     Social History   Social History Narrative   Lives in ALF at [facility].  08/2017 resident of [facility].    01-19-18 Unable to ask abuse questions daughter with her today.   Immunization History  Administered Date(s) Administered  . Influenza Whole 02/09/2012  . Influenza-Unspecified 01/17/2017  . Pneumococcal-Unspecified 04/19/2014     Objective: Vital Signs: BP (!) 167/61 (BP Location: Right Arm, Patient Position: Sitting, Cuff Size: Normal)   Pulse (!) 56   Resp 18   Ht 4\' 9"  (1.448 m)   Wt 100 lb (45.4 kg)   BMI 21.64 kg/m    Physical Exam Vitals and nursing note reviewed.  Constitutional:      Appearance: She is well-developed.  HENT:     Head: Normocephalic and atraumatic.  Eyes:     Conjunctiva/sclera: Conjunctivae normal.  Pulmonary:     Effort: Pulmonary effort is normal.  Abdominal:     Palpations: Abdomen is soft.  Musculoskeletal:     Cervical back: Normal range of motion.  Skin:    General: Skin is warm and dry.     Capillary Refill: Capillary refill takes less than 2 seconds.  Neurological:     Mental Status: She is alert and oriented to person, place, and time.  Psychiatric:        Behavior: Behavior normal.      Musculoskeletal Exam: C-spine good ROM.  Thoracic kyphosis noted. No midline spinal tenderness.  Shoulder joints good ROM with no discomfort.  Limited ROM of both wrist joints.  Tenderness and synoviits  of the right wrist joint.  Synovial thickening of MCP joints.  Ulnar deviation of the MCPs of the right hand.  Knee joints good ROM with no discomfort.  No warmth or effusion of knee joints.  Tenderness of both ankle joints.   CDAI Exam: CDAI Score: 6.8  Patient Global: 4 mm; Provider Global: 4 mm Swollen: 3 ; Tender: 5  Joint Exam 02/18/2020      Right  Left  Wrist  Swollen Tender     MCP 4     Swollen Tender  MCP 5     Swollen Tender  Ankle   Tender   Tender     Investigation: No additional findings.  Imaging: DG Foot Complete Left  Result Date: 01/25/2020 Please see detailed radiograph report in office note.   Recent Labs: Lab Results  Component Value Date   WBC 6.1 09/21/2017   HGB 9.2 (A) 09/21/2017   PLT 171 09/21/2017   NA 139 02/09/2019   K 4.5 02/09/2019   CL 106 02/09/2019   CO2 26 02/09/2019   GLUCOSE 119 (H) 02/09/2019   BUN 33 (H) 02/09/2019   CREATININE 1.07 02/09/2019   BILITOT 0.9 08/26/2017  ALKPHOS 39 08/26/2017   AST 20 08/26/2017   ALT 11 (L) 08/26/2017   PROT 5.3 (L) 08/26/2017   ALBUMIN 2.8 (L) 08/26/2017   CALCIUM 9.1 02/09/2019   GFRAA 51 (L) 01/18/2018    Speciality Comments: No specialty comments available.  Procedures:  No procedures performed Allergies: Latex, Boniva [ibandronic acid], Darvocet [propoxyphene n-acetaminophen], Erythromycin, Morphine and related, Oxycontin [oxycodone hcl], Sulfa antibiotics, Tramadol, and Vicodin [hydrocodone-acetaminophen]   Assessment / Plan:     Visit Diagnoses: Rheumatoid arthritis of multiple sites with negative rheumatoid factor ([place]): She has chronic synovitis of the right wrist joint and the left 4th and 5th MCP joints. Tenderness and synovial thickening of both ankle joints noted. She is on long term oral prednisone 10 mg daily.  She is not taking any immunosuppressive agents at this time.  She has declined more aggressive therapy in the past and dose not want to make any medication changes at  this time.  She is aware of the long term side effects of systemic prednisone use. She was advised to notify us if she develops increased joint pain or joint swelling.  She will follow up with Dr. Estanislado Pandy in 6 months.   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   High risk medication use - Prednisone 10 mg p.o. daily. She does not want to take SSZ or PLQ.  She has declined in change in therapy at this time.  She declined updated lab work today.  She was advised to have Dr. Brigitte Pulse forward [place] lab results when she updates routine lab work.  She denies any recent infections.  She has not received the covid-19 vaccinations and does not plan to at this time.    Primary osteoarthritis of both hands: Chronic pain and stiffness.  Incomplete fist formation bilaterally.   Primary osteoarthritis of both feet: She has chronic pain in both feet.  Thickening and tenderness of both ankle joints noted.  Subluxation of both ankle joints noted.   Age-related osteoporosis without current pathological fracture: DEXA ordered by Dr. Brigitte Pulse per patient. Thoracic kyphosis noted. No midline spinal tenderness. She has not had any recent falls or fractures.  She was strongly encouraged to use a walker at all times to assist with ambulation. She has declined treatment for the management of osteoporosis despite being on long term prednisone.   Other medical conditions are listed as follows:   History of COPD  History of lung cancer  Orders: No orders of the defined types were placed in this encounter.  No orders of the defined types were placed in this encounter.    Follow-Up Instructions: Return in about 6 months (around 08/17/2020) for Rheumatoid arthritis.   Ofilia Neas, PA-C  Note - This record has been created using Dragon software.  Chart creation errors have been sought, but may not always  have been located. Such creation errors do not reflect on  the standard of medical care.

## 2020-02-06 ENCOUNTER — Ambulatory Visit: Payer: Medicare Other | Admitting: Podiatry

## 2020-02-08 ENCOUNTER — Telehealth: Payer: Self-pay | Admitting: *Deleted

## 2020-02-08 NOTE — Telephone Encounter (Signed)
Labs received from [facility] on 02/06/2020 Reviewed by Hazel Sams, PA-C   RBC 3.73 Hct 36.5 MCH 33.3 NE % 88.2 LY% 7.3 MO % 3.6 LY # 0.60 Glucose 111 BUN 27 GFR 49 ALP 29  Patient on Prednisone 10 mg daily.

## 2020-02-11 ENCOUNTER — Telehealth: Payer: Self-pay

## 2020-02-11 NOTE — Telephone Encounter (Signed)
Nurse Katie from [facility] called today about a few concerns from the pt. The pt doesn't like the wound being covered with the Kerlix dressing and has been refusing dressing changes when [place] goes to her house. So the nurse wanted to know if she could use a foam dressing instead. Also the order is for nursing to changed the dressing twice a week but the pt doesn't want the nurse coming to her house twice week. Nursing feels that she is competent and capable to change the dress on her own at least once a week.  I gave Joellen Jersey a verbal order from Dr. Elisha Ponder that they could start using the foam dressing instead of the Kerlix also that they could teach the pt how to change the dressing and they will go to the pts house once a week instead of twice.

## 2020-02-12 ENCOUNTER — Telehealth: Payer: Self-pay

## 2020-02-12 MED ORDER — PREDNISONE 10 MG PO TABS: 10.0000 mg | ORAL_TABLET | Freq: Every day | ORAL | 0 refills | Status: DC

## 2020-02-12 NOTE — Telephone Encounter (Signed)
Patient called requesting prescription refill of Prednisone 10 mg 90 tablets to [facility] at [address].

## 2020-02-12 NOTE — Telephone Encounter (Signed)
Last Visit: 08/21/2019 Next Visit: 02/18/2020  Current Dose per office note on 08/21/2019: Prednisone10mg  p.o. daily  Okay to refill per Dr. Estanislado Pandy

## 2020-02-14 ENCOUNTER — Ambulatory Visit (INDEPENDENT_AMBULATORY_CARE_PROVIDER_SITE_OTHER): Payer: Medicare Other | Admitting: Podiatry

## 2020-02-14 ENCOUNTER — Other Ambulatory Visit: Payer: Self-pay

## 2020-02-14 DIAGNOSIS — M0579 Rheumatoid arthritis with rheumatoid factor of multiple sites without organ or systems involvement: Secondary | ICD-10-CM | POA: Diagnosis not present

## 2020-02-14 DIAGNOSIS — M216X9 Other acquired deformities of unspecified foot: Secondary | ICD-10-CM

## 2020-02-14 DIAGNOSIS — M24572 Contracture, left ankle: Secondary | ICD-10-CM

## 2020-02-14 DIAGNOSIS — M24575 Contracture, left foot: Secondary | ICD-10-CM

## 2020-02-14 DIAGNOSIS — L97522 Non-pressure chronic ulcer of other part of left foot with fat layer exposed: Secondary | ICD-10-CM | POA: Diagnosis not present

## 2020-02-17 NOTE — Progress Notes (Signed)
  Subjective:  Patient ID: [name], female    DOB: 08/04/1929,  MRN: [id number]  Chief Complaint  Patient presents with  . Wound Check    PT has no concerns and denies pain at this time     86 y.o. female presents with the above complaint. History confirmed with patient.  She is here with her daughter today.  She is referred to me by Dr. Elisha Ponder for a left foot ulcer.  She has severe foot deformity secondary to rheumatoid arthritis.  Has been very painful for her in the past  Objective:  Physical Exam: warm, good capillary refill and normal DP and PT pulses.  She has a severe pes cavus foot type with severe rigid hammertoes and hallux valgus.  There is a preulcerative callus submet 2;, On debridement reveals underlying healed skin without ulceration  Assessment:   1. Ulcer of left foot with fat layer exposed ([place])   2. Rheumatoid arthritis involving multiple sites with positive rheumatoid factor (HCC)   3. Contracture of joints of both ankle and foot of left lower extremity   4. Acquired pes cavus      Plan:  Patient was evaluated and treated and all questions answered.   -Ulceration appears to have healed.  The surgical shoe and offloading device is too large for her due to the size of her foot from her contracture.  I gave him the okay to return to regular shoe gear with an offloading aperture pad and I dispensed several of these today.  I would like to see her again 1 more time to ensure that the ulcer remains healed and does not recur  Return in about 3 weeks (around 03/06/2020).

## 2020-02-18 ENCOUNTER — Ambulatory Visit (INDEPENDENT_AMBULATORY_CARE_PROVIDER_SITE_OTHER): Payer: Medicare Other | Admitting: Physician Assistant

## 2020-02-18 ENCOUNTER — Other Ambulatory Visit: Payer: Self-pay

## 2020-02-18 ENCOUNTER — Encounter: Payer: Self-pay | Admitting: Physician Assistant

## 2020-02-18 VITALS — BP 167/61 | HR 56 | Resp 18 | Ht <= 58 in | Wt 100.0 lb

## 2020-02-18 DIAGNOSIS — Z79899 Other long term (current) drug therapy: Secondary | ICD-10-CM

## 2020-02-18 DIAGNOSIS — M0630 Rheumatoid nodule, unspecified site: Secondary | ICD-10-CM

## 2020-02-18 DIAGNOSIS — M0609 Rheumatoid arthritis without rheumatoid factor, multiple sites: Secondary | ICD-10-CM | POA: Diagnosis not present

## 2020-02-18 DIAGNOSIS — M19072 Primary osteoarthritis, left ankle and foot: Secondary | ICD-10-CM

## 2020-02-18 DIAGNOSIS — M19041 Primary osteoarthritis, right hand: Secondary | ICD-10-CM

## 2020-02-18 DIAGNOSIS — M19071 Primary osteoarthritis, right ankle and foot: Secondary | ICD-10-CM

## 2020-02-18 DIAGNOSIS — M19042 Primary osteoarthritis, left hand: Secondary | ICD-10-CM

## 2020-02-18 DIAGNOSIS — Z8709 Personal history of other diseases of the respiratory system: Secondary | ICD-10-CM

## 2020-02-18 DIAGNOSIS — Z85118 Personal history of other malignant neoplasm of bronchus and lung: Secondary | ICD-10-CM

## 2020-02-18 DIAGNOSIS — M810 Age-related osteoporosis without current pathological fracture: Secondary | ICD-10-CM

## 2020-02-26 ENCOUNTER — Encounter (INDEPENDENT_AMBULATORY_CARE_PROVIDER_SITE_OTHER): Payer: Self-pay | Admitting: Ophthalmology

## 2020-02-26 ENCOUNTER — Ambulatory Visit (INDEPENDENT_AMBULATORY_CARE_PROVIDER_SITE_OTHER): Payer: Medicare Other | Admitting: Ophthalmology

## 2020-02-26 ENCOUNTER — Other Ambulatory Visit: Payer: Self-pay

## 2020-02-26 DIAGNOSIS — H43813 Vitreous degeneration, bilateral: Secondary | ICD-10-CM | POA: Diagnosis not present

## 2020-02-26 DIAGNOSIS — H353131 Nonexudative age-related macular degeneration, bilateral, early dry stage: Secondary | ICD-10-CM

## 2020-02-26 NOTE — Progress Notes (Signed)
02/26/2020     CHIEF COMPLAINT Patient presents for Retina Evaluation   HISTORY OF PRESENT ILLNESS: [name] is a 86 y.o. female who presents to the clinic today for:   HPI    Retina Evaluation    In right eye.  Treatments tried include no treatments.  I, the attending physician,  performed the HPI with the patient and updated documentation appropriately.          Comments    NP CME OD - Ref'd by Dr. Midge Aver  Pt denies any changes to [facility] recently OU. Pt denies any symptoms OU. Pt's daughter sts, "we wouldn't have even come to this appointment."       Last edited by Hurman Horn, MD on 02/26/2020  2:36 PM. (History)      Referring physician: Mayra Neer, MD [address],  [place] [address]  HISTORICAL INFORMATION:   Selected notes from the [facility]: No current outpatient medications on file. (Ophthalmic Drugs)   No current facility-administered medications for this visit. (Ophthalmic Drugs)   Current Outpatient Medications (Other)  Medication Sig  . acetaminophen (TYLENOL) 325 MG tablet Take 650 mg by mouth every 8 (eight) hours as needed.  . carvedilol (COREG) 3.125 MG tablet Take 1 tablet (3.125 mg total) by mouth 2 (two) times daily with a meal.  . furosemide (LASIX) 20 MG tablet Take 20 mg by mouth every morning.  Marland Kitchen ipratropium-albuterol (DUONEB) 0.5-2.5 (3) MG/3ML SOLN Take 3 mLs by nebulization every 4 (four) hours as needed (wheezing, shortness of breath). Dx: J44.9  . Multiple Vitamins-Minerals (ALIVE ONCE DAILY WOMENS) TABS Take 1 tablet by mouth daily.  . predniSONE (DELTASONE) 10 MG tablet Take 1 tablet (10 mg total) by mouth daily with breakfast.  . traMADol (ULTRAM) 50 MG tablet Take 50 mg by mouth every 6 (six) hours as needed.   No current facility-administered medications for this visit. (Other)      REVIEW OF SYSTEMS:    ALLERGIES Allergies  Allergen Reactions  .  Latex Other (See Comments)    Blisters on her mouth  . Boniva [Ibandronic Acid]     Muscle aches  . Darvocet [Propoxyphene N-Acetaminophen] Nausea And Vomiting  . Erythromycin Nausea And Vomiting  . Morphine And Related Nausea And Vomiting  . Oxycontin [Oxycodone Hcl] Nausea And Vomiting  . Sulfa Antibiotics Nausea And Vomiting  . Tramadol Nausea And Vomiting  . Vicodin [Hydrocodone-Acetaminophen]     dizzy    PAST MEDICAL HISTORY Past Medical History:  Diagnosis Date  . Cancer ([place])    lung ca  . COPD (chronic obstructive pulmonary disease) ([place])   . Hernia, umbilical   . Hip pain, right   . History of radiation therapy 05/02/12-05/04/12,&05/09/12   rul lung 54Gy/21fx  . HLD (hyperlipidemia)   . Lung mass   . Myocardial infarction ([place]) 08/2017  . Osteoporosis   . PAC (premature atrial contraction)   . RA (rheumatoid arthritis) ([place])   . Shingles    Past Surgical History:  Procedure Laterality Date  . LEFT HEART CATH AND CORONARY ANGIOGRAPHY N/A 08/26/2017   Procedure: LEFT HEART CATH AND CORONARY ANGIOGRAPHY;  Surgeon: Martinique, Peter M, MD;  Location: [facility];  Service: Cardiovascular;  Laterality: N/A;  . right lobectomy      FAMILY HISTORY Family History  Problem Relation Age of Onset  . Cancer Daughter   .  Cancer Mother     SOCIAL HISTORY Social History   Tobacco Use  . Smoking status: Former Smoker    Types: Cigarettes    Quit date: 1995    Years since quitting: 26.8  . Smokeless tobacco: Never Used  Vaping Use  . Vaping Use: Never used  Substance Use Topics  . Alcohol use: No  . Drug use: Never         OPHTHALMIC EXAM: Base Eye Exam    Visual Acuity (ETDRS)      Right Left   Dist [place] 20/20 -1 20/40 -1   Dist ph [place]  20/25       Tonometry (Tonopen, 1:44 PM)      Right Left   Pressure 12 09       Pupils      Pupils Dark Light Shape React APD   Right PERRL 3 2 Round Brisk None   Left PERRL 3 2 Round Brisk None       Visual  Fields (Counting fingers)      Left Right    Full Full       Extraocular Movement      Right Left    Full Full       Neuro/Psych    Oriented x3: Yes   Mood/Affect: Normal       Dilation    Both eyes: 1.0% Mydriacyl, 2.5% Phenylephrine @ 1:53 PM        Slit Lamp and Fundus Exam    External Exam      Right Left   External Normal Normal       Slit Lamp Exam      Right Left   Lids/Lashes Normal Normal   Conjunctiva/Sclera White and quiet White and quiet   Cornea Clear Clear   Anterior Chamber Deep and quiet Deep and quiet   Iris Round and reactive Round and reactive   Lens Centered posterior chamber intraocular lens Centered posterior chamber intraocular lens   Anterior Vitreous Normal Normal       Fundus Exam      Right Left   Posterior Vitreous Posterior vitreous detachment Posterior vitreous detachment   Disc Thin rim Normal   C/D Ratio 0.7 0.7   Macula Normal Normal   Vessels Normal Normal   Periphery Normal Normal          IMAGING AND PROCEDURES  Imaging and Procedures for 02/26/20  OCT, Retina - OU - Both Eyes       Right Eye Quality was good. Scan locations included subfoveal. Central Foveal Thickness: 269. Progression has been stable. Findings include subretinal hyper-reflective material.   Left Eye Quality was good. Scan locations included subfoveal. Central Foveal Thickness: 266.   Notes OD, with small region PERI papillary of subretinal hyperintense reflectivity, no sign of intraretinal fluid or subretinal fluid does not an active CNVM. otherwise with no active maculopathy.  Incidental posterior vitreous detachmentOU                ASSESSMENT/PLAN:  Early stage nonexudative age-related macular degeneration of both eyes The nature of age--related macular degeneration was discussed with the patient as well as the distinction between dry and wet types. Checking an Amsler Grid daily with advice to return immediately should a distortion  develop, was given to the patient. The patient 's smoking status now and in the past was determined and advice based on the AREDS study was provided regarding the consumption of antioxidant supplements. AREDS 2  vitamin formulation was recommended. Consumption of dark leafy vegetables and fresh fruits of various colors was recommended. Treatment modalities for wet macular degeneration particularly the use of intravitreal injections of anti-blood vessel growth factors was discussed with the patient. Avastin, Lucentis, and Eylea are the available options. On occasion, therapy includes the use of photodynamic therapy and thermal laser. Stressed to the patient do not rub eyes.  Patient was advised to check Amsler Grid daily and return immediately if changes are noted. Instructions on using the grid were given to the patient. All patient questions were answered.      ICD-10-CM   1. Early stage nonexudative age-related macular degeneration of both eyes  H35.3131 OCT, Retina - OU - Both Eyes  2. Posterior vitreous detachment of both eyes  H43.813 OCT, Retina - OU - Both Eyes    1.  2.  3.  Ophthalmic Meds Ordered this visit:  No orders of the defined types were placed in this encounter.      Return in about 1 year (around 02/25/2021) for DILATE OU, OCT.  There are no Patient Instructions on file for this visit.   Explained the diagnoses, plan, and follow up with the patient and they expressed understanding.  Patient expressed understanding of the importance of proper follow up care.   Clent Demark Honor Frison M.D. Diseases & Surgery of the Retina and Vitreous Retina & Diabetic [place] 02/26/20     Abbreviations: M myopia (nearsighted); A astigmatism; H hyperopia (farsighted); P presbyopia; Mrx spectacle prescription;  CTL contact lenses; OD right eye; OS left eye; OU both eyes  XT exotropia; ET esotropia; PEK punctate epithelial keratitis; PEE punctate epithelial erosions; DES dry eye syndrome;  MGD meibomian gland dysfunction; ATs artificial tears; PFAT's preservative free artificial tears; [place] nuclear sclerotic cataract; PSC posterior subcapsular cataract; ERM epi-retinal membrane; PVD posterior vitreous detachment; RD retinal detachment; DM diabetes mellitus; DR diabetic retinopathy; NPDR non-proliferative diabetic retinopathy; PDR proliferative diabetic retinopathy; CSME clinically significant macular edema; DME diabetic macular edema; dbh dot blot hemorrhages; CWS cotton wool spot; POAG primary open angle glaucoma; C/D cup-to-disc ratio; HVF humphrey visual field; GVF goldmann visual field; OCT optical coherence tomography; IOP intraocular pressure; BRVO Branch retinal vein occlusion; CRVO central retinal vein occlusion; CRAO central retinal artery occlusion; BRAO branch retinal artery occlusion; RT retinal tear; SB scleral buckle; PPV pars plana vitrectomy; VH Vitreous hemorrhage; PRP panretinal laser photocoagulation; IVK intravitreal kenalog; VMT vitreomacular traction; MH Macular hole;  NVD neovascularization of the disc; NVE neovascularization elsewhere; AREDS age related eye disease study; ARMD age related macular degeneration; POAG primary open angle glaucoma; EBMD epithelial/anterior basement membrane dystrophy; ACIOL anterior chamber intraocular lens; IOL intraocular lens; PCIOL posterior chamber intraocular lens; Phaco/IOL phacoemulsification with intraocular lens placement; [place] photorefractive keratectomy; LASIK laser assisted in situ keratomileusis; HTN hypertension; DM diabetes mellitus; COPD chronic obstructive pulmonary disease

## 2020-02-26 NOTE — Assessment & Plan Note (Signed)

## 2020-02-26 NOTE — Patient Instructions (Signed)
Age-Related Macular Degeneration  Age-related macular degeneration (AMD) is an eye disease related to aging. The disease causes a loss of central vision. Central vision allows a person to see objects clearly and do daily tasks like reading and driving. There are two main types of AMD:  Dry AMD. People with this type generally lose their vision slowly. This is the most common type of AMD. Some people with dry AMD notice very little change in their vision as they age.  Wet AMD. People with this type can lose their vision quickly. What are the causes? This condition is caused by damage to the part of the eye that provides you with central vision (macula).  Dry AMD happens when deposits in the macula cause light-sensitive cells to slowly break down.  Wet AMD happens when abnormal blood vessels grow under the macula and leak blood and fluid. What increases the risk? You are more likely to develop this condition if you:  Are 50 years old or older, and especially 75 years old or older.  Smoke.  Are obese.  Have a family history of AMD.  Have high cholesterol, high blood pressure, or heart disease.  Have been exposed to high levels of ultraviolet (UV) light and blue light.  Are white (Caucasian).  Are female. What are the signs or symptoms? Common symptoms of this condition include:  Blurred vision, especially when reading print material. The blurred vision often improves in brighter light.  A blurred or blind spot in the center of your field of vision that is small but growing larger.  Bright colors seeming less bright than they used to be.  Decreased ability to recognize and see faces.  One eye seeing worse than the other.  Decreased ability to adapt to dimly lit rooms.  Straight lines appearing crooked or wavy. How is this diagnosed? This condition is diagnosed based on your symptoms and an eye exam. During the eye exam:  Eye drops will be placed into your eyes to  enlarge (dilate) your pupils. This will allow your health care provider to see the back of your eye.  You may be asked to look at an image that looks like a checkerboard (Amsler grid). Early changes in your central vision may cause the grid to appear distorted. After the exam, you may be given one or both of these tests:  Fluorescein angiogram. This test determines whether you have dry or wet AMD.  Optical coherence tomography (OCT) test to evaluate deep layers of the retina. How is this treated? There is no cure for this condition, but treatment can help to slow down progression of the disease. This condition may be treated with:  Supplements, including vitamin C, vitamin E, beta carotene, and zinc.  Laser surgery to destroy new blood vessels or leaking blood vessels in your eye.  Injections of medicines into your eye to slow down the formation of abnormal blood vessels that may leak. These injections may need to be repeated on a routine basis. Follow these instructions at home:  Take over-the-counter and prescription medicines only as told by your health care provider.  Take vitamins and supplements as told by your health care provider.  Ask your health care provider for an Amsler grid. Use it every day to check each eye for vision changes.  Get an eye exam as often as told by your health care provider. Make sure to get an eye exam at least once every year.  Keep all follow-up visits as told by   your health care provider. This is important. Contact a health care provider if:  You notice any new changes in your vision. Get help right away if:  You suddenly lose vision or develop pain in the eye. Summary  Age-related macular degeneration (AMD) is an eye disease related to aging. There are two types of this condition: dry AMD and wet AMD.  This condition is caused by damage to the part of the eye that provides you with central vision (macula).  Once diagnosed with AMD, make sure  to get an eye exam every year, take supplements and vitamins as directed, use an Amsler grid at home, and follow up with your health care provider. This information is not intended to replace advice given to you by your health care provider. Make sure you discuss any questions you have with your health care provider. Document Revised: 10/12/2017 Document Reviewed: 10/12/2017 Elsevier Patient Education  2020 Elsevier Inc.  

## 2020-02-28 ENCOUNTER — Telehealth: Payer: Self-pay | Admitting: Pulmonary Disease

## 2020-02-28 DIAGNOSIS — C3490 Malignant neoplasm of unspecified part of unspecified bronchus or lung: Secondary | ICD-10-CM

## 2020-02-28 NOTE — Telephone Encounter (Signed)
Labs are needed because she was ordered for CT with contrast Please change to CT without contrast

## 2020-02-28 NOTE — Telephone Encounter (Signed)
Pt has a chest  CT order w/ contrast for 03/20/20 and needs to have labs ordered.

## 2020-02-28 NOTE — Telephone Encounter (Signed)
  Pt is scheduled for a CT chest with contrast on 12/8 at [facility].  Labs are being requested- no documentation on what specific labs.  Nobody documented to call back and verify.    Dr. Vaughan Browner please advise on what labs you would like [place] to order prior to CT chest.  Thanks!

## 2020-02-29 ENCOUNTER — Ambulatory Visit: Payer: Medicare Other | Admitting: Podiatry

## 2020-02-29 ENCOUNTER — Other Ambulatory Visit: Payer: Self-pay

## 2020-02-29 DIAGNOSIS — M0579 Rheumatoid arthritis with rheumatoid factor of multiple sites without organ or systems involvement: Secondary | ICD-10-CM

## 2020-02-29 DIAGNOSIS — L84 Corns and callosities: Secondary | ICD-10-CM

## 2020-02-29 DIAGNOSIS — M216X9 Other acquired deformities of unspecified foot: Secondary | ICD-10-CM

## 2020-02-29 NOTE — Telephone Encounter (Signed)
CT wo contrast has been ordered.  PCC's, please advise.  Patient is currently scheduled for CT with contrast on 03/26/2020. This needs to be changed to WO.

## 2020-02-29 NOTE — Telephone Encounter (Signed)
I have let Lattie Haw w/ [facility] know to attach the CT order w/o contrast on 12/8.

## 2020-03-02 NOTE — Progress Notes (Signed)
  Subjective:  Patient ID: [name], female    DOB: Jan 10, 1930,  MRN: [id number]  Chief Complaint  Patient presents with  . Foot Ulcer    Pt guardian states pt has no fever/chills/nausea/vomiting and no new concerns.    86 y.o. female returns with the above complaint. History confirmed with patient. Remains healed, pad has been helpful  Objective:  Physical Exam: warm, good capillary refill and normal DP and PT pulses.  She has a severe pes cavus foot type with severe rigid hammertoes and hallux valgus.  There is a preulcerative callus submet 2;, On debridement reveals underlying healed skin without ulceration  Assessment:   No diagnosis found.   Plan:  Patient was evaluated and treated and all questions answered.   -Remains healed. Continue use of pad. Return for regular care with Dr Elisha Ponder  No follow-ups on file.

## 2020-03-06 ENCOUNTER — Ambulatory Visit: Payer: Medicare Other | Admitting: Podiatry

## 2020-03-26 ENCOUNTER — Inpatient Hospital Stay: Admission: RE | Admit: 2020-03-26 | Payer: Medicare Other | Source: Ambulatory Visit

## 2020-03-26 ENCOUNTER — Other Ambulatory Visit: Payer: Medicare Other

## 2020-03-27 ENCOUNTER — Ambulatory Visit (INDEPENDENT_AMBULATORY_CARE_PROVIDER_SITE_OTHER)
Admission: RE | Admit: 2020-03-27 | Discharge: 2020-03-27 | Disposition: A | Discharge status: Home / Self Care | Payer: Medicare Other | Source: Ambulatory Visit | Attending: Pulmonary Disease | Admitting: Pulmonary Disease

## 2020-03-27 ENCOUNTER — Other Ambulatory Visit: Payer: Self-pay

## 2020-03-27 DIAGNOSIS — C3490 Malignant neoplasm of unspecified part of unspecified bronchus or lung: Secondary | ICD-10-CM | POA: Diagnosis not present

## 2020-04-01 ENCOUNTER — Ambulatory Visit: Payer: Medicare Other | Admitting: Podiatry

## 2020-04-01 ENCOUNTER — Other Ambulatory Visit: Payer: Self-pay

## 2020-04-01 ENCOUNTER — Encounter: Payer: Self-pay | Admitting: Podiatry

## 2020-04-01 DIAGNOSIS — M0579 Rheumatoid arthritis with rheumatoid factor of multiple sites without organ or systems involvement: Secondary | ICD-10-CM | POA: Diagnosis not present

## 2020-04-01 DIAGNOSIS — C3490 Malignant neoplasm of unspecified part of unspecified bronchus or lung: Secondary | ICD-10-CM

## 2020-04-01 DIAGNOSIS — R270 Ataxia, unspecified: Secondary | ICD-10-CM | POA: Insufficient documentation

## 2020-04-01 DIAGNOSIS — G4700 Insomnia, unspecified: Secondary | ICD-10-CM | POA: Insufficient documentation

## 2020-04-01 DIAGNOSIS — Z872 Personal history of diseases of the skin and subcutaneous tissue: Secondary | ICD-10-CM

## 2020-04-01 DIAGNOSIS — R54 Age-related physical debility: Secondary | ICD-10-CM | POA: Insufficient documentation

## 2020-04-01 DIAGNOSIS — N838 Other noninflammatory disorders of ovary, fallopian tube and broad ligament: Secondary | ICD-10-CM | POA: Insufficient documentation

## 2020-04-01 DIAGNOSIS — I519 Heart disease, unspecified: Secondary | ICD-10-CM | POA: Insufficient documentation

## 2020-04-01 DIAGNOSIS — E7800 Pure hypercholesterolemia, unspecified: Secondary | ICD-10-CM | POA: Insufficient documentation

## 2020-04-01 DIAGNOSIS — I214 Non-ST elevation (NSTEMI) myocardial infarction: Secondary | ICD-10-CM | POA: Insufficient documentation

## 2020-04-01 HISTORY — DX: Malignant neoplasm of unspecified part of unspecified bronchus or lung: C34.90

## 2020-04-02 ENCOUNTER — Telehealth: Payer: Self-pay | Admitting: Pulmonary Disease

## 2020-04-02 NOTE — Telephone Encounter (Signed)
Emily Garfinkel, MD  04/01/2020 5:24 AM EST      CT shows stable lung nodules and emphysema. Order follow up CT without contrast for lung nodules in 1 year.    Called and spoke with pt's daughter [name] letting her know the results of pt's CT and that we were going to repeat scan in 1 year. [name] verbalized understanding. Nothing further needed.

## 2020-04-06 NOTE — Progress Notes (Signed)
Subjective: [name] presents today for follow up of follow up of ulceration to the {submet head 3 left foot.   Her daughter, [name], is present during today's visit. [name] states her foot feels better, but area is still sore.   She denies any drainage or swelling. Denies any fever, chills, night sweats, nausea or vomiting.  Allergies  Allergen Reactions  . Latex Other (See Comments)    Blisters on her mouth  . Boniva [Ibandronic Acid]     Muscle aches  . Darvocet [Propoxyphene N-Acetaminophen] Nausea And Vomiting  . Erythromycin Nausea And Vomiting  . Morphine And Related Nausea And Vomiting  . Oxycontin [Oxycodone Hcl] Nausea And Vomiting  . Sulfa Antibiotics Nausea And Vomiting  . Tramadol Nausea And Vomiting  . Vicodin [Hydrocodone-Acetaminophen]     dizzy     Objective: Physical examination unchanged on today's visit. There were no vitals filed for this visit.  Pt is a pleasant 86 y.o. year old Caucasian female in NAD. AAO x 3.  Vascular Examination:  Capillary refill time to digits immediate b/l. Palpable DP pulses b/l. Palpable PT pulses b/l. Pedal hair present b/l. Skin temperature gradient within normal limits b/l. No edema noted b/l.  Dermatological Examination: Pedal skin with normal turgor, texture and tone bilaterally. No interdigital macerations bilaterally. Toenails 1-5 b/l elongated, dystrophic, thickened, crumbly with subungual debris and tenderness to dorsal palpation.   Wound Location: submet head 3 left foot with hyperkeratotic roof, subdermal hemorrhage and fluctuance There is a small amount of devitalized tissue present in the wound. Predebridement Wound Measurement:  0.5 x 0.5 cm  Postdebridement Wound Measurement: 0.2 x 0.2 cm area of subdermal hemorrhage; epithelialized. Wound Base: epithelial Peri-wound: Normal Exudate: None Blood Loss during debridement: 0 cc's. Material in wound which inhibits healing/promotes adjacent tissue breakdown:   nonviable hyperkeratosis. Description of tissue removed from ulceration today:  nonviable hyperkeratosis. Sign(s) of clinical bacterial infection: no clinical signs of infection noted on examination today.   Musculoskeletal: Normal muscle strength 5/5 to all lower extremity muscle groups bilaterally. No pain crepitus or joint limitation noted with ROM b/l. Hammertoes noted to the 1-5 bilaterally. Utilizes wheelchair for mobility assistance.  Neurological: Protective sensation intact 5/5 intact bilaterally with 10g monofilament b/l. Vibratory sensation intact b/l.   Assessment: 1. Healed ulcer of left foot   2. Rheumatoid arthritis involving multiple sites with positive rheumatoid factor ([name])    Plan: -Examined patient. -Patient was evaluated and treated and all questions answered.  -Patient/Daughter educated on diagnosis and treatment plan of routine ulcer debridement/wound care.  -Ulceration debridement achieved utilizing sharp excisional debridement with sterile scalpel blade.. Type/amount of devitalized tissue removed: nonviable hyperkeratosis. -Today's ulcer size post-debridement is healed.  -Ulceration cleansed with wound cleanser.  Triple antibiotic ointment and band-aid applied. -Continue use of offloaded Darco shoe when at home. -Wound responded well to today's debridement. -Patient risk factors affecting healing of ulcer: foot deformity, immunosuppressive medications, loss of plantar fat pad, Rheumatoid arthritis. -Patient to report any pedal injuries to medical professional immediately. -Patient to continue soft, supportive shoe gear daily. -Patient/POA to call should there be question/concern in the interim.  Return in about 9 weeks (around 06/03/2020) for preulcerative calluses, painful mycotic toenails.

## 2020-04-30 ENCOUNTER — Ambulatory Visit: Payer: Medicare Other | Admitting: Podiatry

## 2020-05-12 ENCOUNTER — Other Ambulatory Visit: Payer: Self-pay | Admitting: Rheumatology

## 2020-05-12 MED ORDER — PREDNISONE 10 MG PO TABS: 10.0000 mg | ORAL_TABLET | Freq: Every day | ORAL | 0 refills | Status: DC

## 2020-05-12 NOTE — Telephone Encounter (Signed)
Last Visit: 02/18/2020 Next Visit: 08/11/2020  Current Dose per office note on 02/18/2020, prednisone 10 mg daily Dx: Rheumatoid arthritis of multiple sites with negative rheumatoid factor   Okay to refill Prednisone?

## 2020-05-12 NOTE — Telephone Encounter (Signed)
Patient request Prednisone 10 mg to be sent to [facility] on [address].

## 2020-06-02 ENCOUNTER — Other Ambulatory Visit: Payer: Self-pay

## 2020-06-02 ENCOUNTER — Ambulatory Visit: Payer: Medicare Other | Admitting: Podiatry

## 2020-06-02 ENCOUNTER — Encounter: Payer: Self-pay | Admitting: Podiatry

## 2020-06-02 DIAGNOSIS — M79674 Pain in right toe(s): Secondary | ICD-10-CM | POA: Diagnosis not present

## 2020-06-02 DIAGNOSIS — B351 Tinea unguium: Secondary | ICD-10-CM

## 2020-06-02 DIAGNOSIS — L84 Corns and callosities: Secondary | ICD-10-CM

## 2020-06-02 DIAGNOSIS — M0579 Rheumatoid arthritis with rheumatoid factor of multiple sites without organ or systems involvement: Secondary | ICD-10-CM | POA: Diagnosis not present

## 2020-06-02 DIAGNOSIS — M79672 Pain in left foot: Secondary | ICD-10-CM

## 2020-06-02 DIAGNOSIS — M79675 Pain in left toe(s): Secondary | ICD-10-CM

## 2020-06-02 DIAGNOSIS — M79671 Pain in right foot: Secondary | ICD-10-CM | POA: Diagnosis not present

## 2020-06-08 NOTE — Progress Notes (Signed)
Subjective: Deloris Moger presents today for follow up of follow up of ulceration to the submet head 3 left foot.   Her daughter, [name], is present during today's visit. Ms. Egli states her foot feels better.    She denies any drainage or swelling. Denies any fever, chills, night sweats, nausea or vomiting.  Allergies  Allergen Reactions  . Latex Other (See Comments)    Blisters on her mouth  . Other Shortness Of Breath    Magic Mouthwash  . Boniva [Ibandronic Acid]     Muscle aches  . Darvocet [Propoxyphene N-Acetaminophen] Nausea And Vomiting  . Erythromycin Nausea And Vomiting  . Morphine And Related Nausea And Vomiting  . Oxycontin [Oxycodone Hcl] Nausea And Vomiting  . Oxycontin [Oxycodone]     Other reaction(s): Unknown  . Sulfa Antibiotics Nausea And Vomiting  . Tramadol Nausea And Vomiting  . Vicodin Hp [Hydrocodone-Acetaminophen]     Other reaction(s): dizzy  . Vicodin [Hydrocodone-Acetaminophen]     dizzy     Objective: Physical examination unchanged on today's visit. There were no vitals filed for this visit.  Pt is a pleasant 87 y.o. year old Caucasian female in NAD. AAO x 3.  Vascular Examination:  Capillary refill time to digits immediate b/l. Palpable DP pulses b/l. Palpable PT pulses b/l. Pedal hair present b/l. Skin temperature gradient within normal limits b/l. No edema noted b/l.  Dermatological Examination: Pedal skin with normal turgor, texture and tone bilaterally. No interdigital macerations bilaterally. Toenails 1-5 b/l elongated, dystrophic, thickened, crumbly with subungual debris and tenderness to dorsal palpation.   Hyperkeratotic lesion submet head 3 left foot with tenderness to palpation. +Subdermal hemorrhage.  No edema, no erythema, no drainage, no flocculence.  Musculoskeletal: Normal muscle strength 5/5 to all lower extremity muscle groups bilaterally. No pain crepitus or joint limitation noted with ROM b/l. Hammertoes noted to the 1-5  bilaterally. Utilizes wheelchair for mobility assistance.  Neurological: Protective sensation intact 5/5 intact bilaterally with 10g monofilament b/l. Vibratory sensation intact b/l.   Assessment: 1. Pain due to onychomycosis of toenails of both feet   2. Pre-ulcerative calluses   3. Pain in both feet   4. Rheumatoid arthritis involving multiple sites with positive rheumatoid factor ([place])    Plan: -Examined patient. -Patient was evaluated and treated and all questions answered.  -Preulcerative callus pared submet head 3 left foot. Dispensed hook-on metatarsal pad cushion for daily protection of metatarsals. -Continue use of offloaded Darco shoe when at home. -Patient to report any pedal injuries to medical professional immediately. -Patient to continue soft, supportive shoe gear daily. -Patient/POA to call should there be question/concern in the interim.  Return in about 9 weeks (around 08/04/2020).

## 2020-06-17 ENCOUNTER — Ambulatory Visit: Payer: Medicare Other | Admitting: Podiatry

## 2020-06-24 ENCOUNTER — Ambulatory Visit: Payer: Medicare Other | Admitting: Podiatry

## 2020-07-21 ENCOUNTER — Ambulatory Visit: Payer: Medicare Other | Admitting: Pulmonary Disease

## 2020-07-29 NOTE — Progress Notes (Signed)
Office Visit Note  Patient: [name]             Date of Birth: 05/28/1929           MRN: [id number]             PCP: Mayra Neer, MD Referring: Mayra Neer, MD Visit Date: 08/12/2020 Occupation: @GUAROCC @  Subjective:  Medication monitoring   History of Present Illness: [name] is a 87 y.o. female with history of rheumatoid arthritis and osteoarthritis. She is currently taking prednisone 10 by mouth daily and she is unable to taper.  She states her pain and swelling have been well controlled on the current dose of prednisone.  She has been taking prednisone in the morning with breakfast as advised.  She states that her appetite has been good and she has been trying to increase her strength.  She has been trying to get up several times per day to walk short distances.  She continues to have chronic pain in both feet due to severe pedal edema. She continues to have regular lab work with Dr. Brigitte Pulse.   Activities of Daily Living:  Patient reports morning stiffness for several minutes.   Patient Reports nocturnal pain.  Difficulty dressing/grooming: Reports Difficulty climbing stairs: Reports Difficulty getting out of chair: Reports Difficulty using hands for taps, buttons, cutlery, and/or writing: Reports  Review of Systems  Constitutional: Positive for fatigue.  HENT: Negative for mouth sores, mouth dryness and nose dryness.   Eyes: Negative for pain, itching and dryness.  Respiratory: Negative for shortness of breath and difficulty breathing.   Cardiovascular: Positive for swelling in legs/feet. Negative for chest pain and palpitations.  Gastrointestinal: Negative for blood in stool, constipation and diarrhea.  Endocrine: Positive for increased urination.  Genitourinary: Negative for difficulty urinating and painful urination.  Musculoskeletal: Positive for arthralgias, joint pain, joint swelling and morning stiffness. Negative for myalgias, muscle tenderness and  myalgias.  Skin: Negative for color change, rash and redness.  Allergic/Immunologic: Negative for susceptible to infections.  Neurological: Negative for dizziness, numbness and headaches.  Hematological: Positive for bruising/bleeding tendency.  Psychiatric/Behavioral: Positive for sleep disturbance.    PMFS History:  Patient Active Problem List   Diagnosis Date Noted  . Acute non-ST segment elevation myocardial infarction ([place]) 04/01/2020  . Ataxia 04/01/2020  . Frail elderly 04/01/2020  . Heart disease 04/01/2020  . Hypercholesterolemia 04/01/2020  . Insomnia 04/01/2020  . Ovarian mass 04/01/2020  . Malignant neoplasm of lung ([place]) 04/01/2020  . Early stage nonexudative age-related macular degeneration of both eyes 02/26/2020  . Posterior vitreous detachment of both eyes 02/26/2020  . Abnormal CT of the chest 09/01/2017  . Adult failure to thrive 09/01/2017  . Neurocognitive disorder 08/30/2017  . NSTEMI (non-ST elevated myocardial infarction) ([place]) 08/25/2017  . 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 Primary osteoarthritis of both hands 06/09/2016  . Primary osteoarthritis of both feet 06/09/2016  . History of lung cancer 06/09/2016  . High risk medication use 06/09/2016  . Aortic calcification ([place]) 05/10/2013  . Coronary artery calcification 05/10/2013  . Protein-calorie malnutrition, severe ([place]) 12/25/2012  . Depression, recurrent ([place]) 12/25/2012  . Anemia of chronic disease 12/24/2012  . Thrombocytopenia, unspecified 12/24/2012  . Aphthous stomatitis 12/24/2012  . Bleeding from nasopharynx 12/24/2012  . Cellulitis 12/23/2012  . Hemoptysis? Secondary to either irritation by dentures, vs. secondary to lung neoplasm 12/23/2012  . Rheumatoid arthritis-severe 12/23/2012  . COPD, mild ([place]) 12/23/2012  . Osteoporosis 12/23/2012  . Primary cancer  of right upper lobe of lung ([place]) 03/21/2012    Past Medical History:  Diagnosis Date  . Cancer ([place])    lung ca  .  COPD (chronic obstructive pulmonary disease) ([place])   . Hernia, umbilical   . Hip pain, right   . History of radiation therapy 05/02/12-05/04/12,&05/09/12   rul lung 54Gy/2fx  . HLD (hyperlipidemia)   . Lung mass   . Myocardial infarction ([place]) 08/2017  . Osteoporosis   . PAC (premature atrial contraction)   . RA (rheumatoid arthritis) ([place])   . Shingles     Family History  Problem Relation Age of Onset  . Cancer Daughter   . Cancer Mother    Past Surgical History:  Procedure Laterality Date  . LEFT HEART CATH AND CORONARY ANGIOGRAPHY N/A 08/26/2017   Procedure: LEFT HEART CATH AND CORONARY ANGIOGRAPHY;  Surgeon: Martinique, Peter M, MD;  Location: [facility];  Service: Cardiovascular;  Laterality: N/A;  . right lobectomy     Social History   Social History Narrative   Lives in ALF at [facility].  08/2017 resident of [facility].    01-19-18 Unable to ask abuse questions daughter with her today.   Immunization History  Administered Date(s) Administered  . Influenza Whole 02/09/2012  . Influenza-Unspecified 01/17/2017  . Pneumococcal-Unspecified 04/19/2014     Objective: Vital Signs: BP 129/63 (BP Location: Left Arm, Patient Position: Sitting, Cuff Size: Normal)   Pulse 65   Wt 105 lb (47.6 kg) Comment: per patient, patient in wheelchair  BMI 22.72 kg/m    Physical Exam Vitals and nursing note reviewed.  Constitutional:      Appearance: She is well-developed.  HENT:     Head: Normocephalic and atraumatic.  Eyes:     Conjunctiva/sclera: Conjunctivae normal.  Pulmonary:     Effort: Pulmonary effort is normal.  Abdominal:     Palpations: Abdomen is soft.  Musculoskeletal:     Cervical back: Normal range of motion.  Skin:    General: Skin is warm and dry.     Capillary Refill: Capillary refill takes less than 2 seconds.  Neurological:     Mental Status: She is alert and oriented to person, place, and time.  Psychiatric:         Behavior: Behavior normal.      Musculoskeletal Exam: Patient remained in wheelchair during the examination. C-spine limited ROM with flexion and lateral rotation.  Thoracic kyphosis noted.  No midline spinal tenderness. Tenderness and synovitis of the right wrist. Incomplete fist formation bilaterally. Hip joints had good ROM.  Painful ROM of both knees with mild warmth.  Pedal edema noted bilaterally.    CDAI Exam: CDAI Score: -- Patient Global: --; Provider Global: -- Swollen: --; Tender: -- Joint Exam 08/12/2020   No joint exam has been documented for this visit   There is currently no information documented on the homunculus. Go to the Rheumatology activity and complete the homunculus joint exam.  Investigation: No additional findings.  Imaging: No results found.  Recent Labs: Lab Results  Component Value Date   WBC 6.1 09/21/2017   HGB 9.2 (A) 09/21/2017   PLT 171 09/21/2017   NA 139 02/09/2019   K 4.5 02/09/2019   CL 106 02/09/2019   CO2 26 02/09/2019   GLUCOSE 119 (H) 02/09/2019   BUN 33 (H) 02/09/2019   CREATININE 1.07 02/09/2019   BILITOT 0.9 08/26/2017   ALKPHOS 39 08/26/2017   AST 20 08/26/2017  ALT 11 (L) 08/26/2017   PROT 5.3 (L) 08/26/2017   ALBUMIN 2.8 (L) 08/26/2017   CALCIUM 9.1 02/09/2019   GFRAA 51 (L) 01/18/2018    Speciality Comments: No specialty comments available.  Procedures:  No procedures performed Allergies: Latex, Other, Boniva [ibandronic acid], Darvocet [propoxyphene n-acetaminophen], Erythromycin, Morphine and related, Oxycontin [oxycodone hcl], Oxycontin [oxycodone], Sulfa antibiotics, Tramadol, Vicodin hp [hydrocodone-acetaminophen], and Vicodin [hydrocodone-acetaminophen]   Assessment / Plan:     Visit Diagnoses: Rheumatoid arthritis of multiple sites with negative rheumatoid factor ([place]): She has chronic synovitis and limited ROM of the right wrist joint.  Incomplete fist formation noted bilaterally.  She experiences chronic  pain in both knee joints and has difficulty ambulating without support. Her symptoms have been well managed on prednisone 10 mg by mouth daily.  She has been tolerating prednisone without any side effects.  She is aware of the longterm risks of prednisone use.  Discussed the importance of regular light exercise.  She will remain on prednisone as prescribed.  She does not need a refill at this time. She will follow up in 6 months.   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   High risk medication use - Prednisone 10 mg by mouth daily. She does not want to take SSZ or PLQ.  She has declined in change in therapy at this time.  She is aware of the longterm risks of systemic prednisone use.  She was advised to continue to take prednisone with food and to avoid the use of all NSAIDs.    Primary osteoarthritis of both hands: PIP thickening noted. Incomplete fist formation bilaterally.   Primary osteoarthritis of both feet: Chronic pain.  She has pedal edema bilaterally, which contributes to her discomfort.    Age-related osteoporosis without current pathological fracture - DEXA ordered by Dr. Brigitte Pulse per patient. She has declined treatment in the past.   Other medical conditions are listed as follows:   History of COPD  History of lung cancer  NSTEMI (non-ST elevated myocardial infarction) ([place])  Aortic calcification ([place])  Orders: No orders of the defined types were placed in this encounter.  No orders of the defined types were placed in this encounter.     Follow-Up Instructions: Return in about 6 months (around 02/11/2021) for Rheumatoid arthritis, Osteoarthritis.   Ofilia Neas, PA-C  Note - This record has been created using Dragon software.  Chart creation errors have been sought, but may not always  have been located. Such creation errors do not reflect on  the standard of medical care.

## 2020-07-29 NOTE — Progress Notes (Signed)
Cardiology Clinic Note   Patient Name: [name] Date of Encounter: 07/31/2020  Primary Care Provider:  Mayra Neer, MD Primary Cardiologist:  Pixie Casino, MD  Patient Profile    [name] 87 year old female presents to the clinic today for follow-up evaluation of her coronary artery disease.  Past Medical History    Past Medical History:  Diagnosis Date  . Cancer ([place])    lung ca  . COPD (chronic obstructive pulmonary disease) ([place])   . Hernia, umbilical   . Hip pain, right   . History of radiation therapy 05/02/12-05/04/12,&05/09/12   rul lung 54Gy/63fx  . HLD (hyperlipidemia)   . Lung mass   . Myocardial infarction ([place]) 08/2017  . Osteoporosis   . PAC (premature atrial contraction)   . RA (rheumatoid arthritis) ([place])   . Shingles    Past Surgical History:  Procedure Laterality Date  . LEFT HEART CATH AND CORONARY ANGIOGRAPHY N/A 08/26/2017   Procedure: LEFT HEART CATH AND CORONARY ANGIOGRAPHY;  Surgeon: Martinique, Peter M, MD;  Location: [facility];  Service: Cardiovascular;  Laterality: N/A;  . right lobectomy      Allergies  Allergies  Allergen Reactions  . Latex Other (See Comments)    Blisters on her mouth  . Other Shortness Of Breath    Magic Mouthwash  . Boniva [Ibandronic Acid]     Muscle aches  . Darvocet [Propoxyphene N-Acetaminophen] Nausea And Vomiting  . Erythromycin Nausea And Vomiting  . Morphine And Related Nausea And Vomiting  . Oxycontin [Oxycodone Hcl] Nausea And Vomiting  . Oxycontin [Oxycodone]     Other reaction(s): Unknown  . Sulfa Antibiotics Nausea And Vomiting  . Tramadol Nausea And Vomiting  . Vicodin Hp [Hydrocodone-Acetaminophen]     Other reaction(s): dizzy  . Vicodin [Hydrocodone-Acetaminophen]     dizzy    History of Present Illness    [name] has a PMH of lung cancer, COPD, PACs, dyslipidemia, coronary artery disease, and hyperlipidemia.  She was hospitalized 5/19 with acute shortness of breath  and nausea.  She was found to have elevated troponins and admitted by cardiology.  She underwent cardiac catheterization which showed mild coronary artery disease and normal LV function.  Her echocardiogram showed normal LV function with severe LVH.  She received diuresis as well as low-dose beta-blocker.  She followed up with Dr. Debara Pickett on 01/25/2018.  During that time she continued to do well.  She denied any recurrent symptoms.  She was noted to have continued lower extremity edema that was felt to be dependent.  She was also noted to have history of PVD and was intolerant of compression stockings.  She was lost to follow-up and presents to the clinic today for evaluation.  She states she has daily lower extremity edema that dissipates overnight.  She has severe bilateral arthritis in her hands and is unable to use compression stockings.  She reports dietary indiscretion.  She does do some walking throughout the day around her independent living facility.  She also reports that she consumes a large amount of fluid through the day as well.  We discussed low-salt diet, fluid restriction, light compression with Ace bandages, and allowing someone to assist her with her care needs.  Her lower extremity edema appears to be more related to dependent edema versus venous stasis.  I will give her the salty 6 diet sheet, have her increase her physical activity as tolerated, follow fluid restriction and follow-up in 6 months.  Today  she denies chest pain, shortness of breath,  fatigue, palpitations, melena, hematuria, hemoptysis, diaphoresis, weakness, presyncope, syncope, orthopnea, and PND.   Home Medications    Prior to Admission medications   Medication Sig Start Date End Date Taking? Authorizing Provider  acetaminophen (TYLENOL) 325 MG tablet Take 650 mg by mouth every 8 (eight) hours as needed.    [provider]  carvedilol (COREG) 3.125 MG tablet Take 1 tablet (3.125 mg total) by mouth 2 (two)  times daily with a meal. 10/14/17   Medina-Vargas, Monina C, NP  furosemide (LASIX) 20 MG tablet Take 20 mg by mouth every morning. 01/31/19   [provider]  ipratropium-albuterol (DUONEB) 0.5-2.5 (3) MG/3ML SOLN Take 3 mLs by nebulization every 4 (four) hours as needed (wheezing, shortness of breath). Dx: J44.9 02/20/18   Marshell Garfinkel, MD  Multiple Vitamins-Minerals (ALIVE ONCE DAILY WOMENS) TABS Take 1 tablet by mouth daily.    [provider]  predniSONE (DELTASONE) 10 MG tablet Take 1 tablet (10 mg total) by mouth daily with breakfast. 05/12/20   Ofilia Neas, PA-C  traMADol (ULTRAM) 50 MG tablet Take 50 mg by mouth every 6 (six) hours as needed. 10/16/19   [provider]    Family History    Family History  Problem Relation Age of Onset  . Cancer Daughter   . Cancer Mother    She indicated that her mother is deceased. She indicated that her father is deceased. She indicated that only one of her two sisters is alive. She indicated that her brother is deceased. She indicated that her daughter is alive.  Social History    Social History   Socioeconomic History  . Marital status: Widowed    Spouse name: Not on file  . Number of children: Not on file  . Years of education: Not on file  . Highest education level: Not on file  Occupational History  . Occupation: retired    Fish farm manager: RETIRED  Tobacco Use  . Smoking status: Former Smoker    Types: Cigarettes    Quit date: 1995    Years since quitting: 27.3  . Smokeless tobacco: Never Used  Vaping Use  . Vaping Use: Never used  Substance and Sexual Activity  . Alcohol use: No  . Drug use: Never  . Sexual activity: Not on file  Other Topics Concern  . Not on file  Social History Narrative   Lives in ALF at [facility]  08/2017 resident of [facility].    01-19-18 Unable to ask abuse questions daughter with her today.   Social Determinants of Health   Financial Resource  Strain: Not on file  Food Insecurity: Not on file  Transportation Needs: Not on file  Physical Activity: Not on file  Stress: Not on file  Social Connections: Not on file  Intimate Partner Violence: Not on file     Review of Systems    General:  No chills, fever, night sweats or weight changes.  Cardiovascular:  No chest pain, dyspnea on exertion, edema, orthopnea, palpitations, paroxysmal nocturnal dyspnea. Dermatological: No rash, lesions/masses Respiratory: No cough, dyspnea Urologic: No hematuria, dysuria Abdominal:   No nausea, vomiting, diarrhea, bright red blood per rectum, melena, or hematemesis Neurologic:  No visual changes, wkns, changes in mental status. All other systems reviewed and are otherwise negative except as noted above.  Physical Exam    VS:  BP 130/60   Pulse (!) 59   Ht 4\' 9"  (1.448  m)   Wt 102 lb 12.8 oz (46.6 kg)   BMI 22.25 kg/m  , BMI Body mass index is 22.25 kg/m. GEN: Well nourished, well developed, in no acute distress. HEENT: normal. Neck: Supple, no JVD, carotid bruits, or masses. Cardiac: RRR, no murmurs, rubs, or gallops. No clubbing, cyanosis,1- 2+ pitting bilateral lower extremity edema.  Radials/DP/PT 2+ and equal bilaterally.  Respiratory:  Respirations regular and unlabored, clear to auscultation bilaterally. GI: Soft, nontender, nondistended, BS + x 4. MS: no deformity or atrophy. Skin: warm and dry, no rash. Neuro:  Strength and sensation are intact. Psych: Normal affect.  Accessory Clinical Findings    Recent Labs: No results found for requested labs within last 8760 hours.   Recent Lipid Panel    Component Value Date/Time   CHOL 168 08/26/2017 0038   TRIG 68 08/26/2017 0038   HDL 68 08/26/2017 0038   CHOLHDL 2.5 08/26/2017 0038   VLDL 14 08/26/2017 0038   LDLCALC 86 08/26/2017 0038    ECG personally reviewed by me today-sinus bradycardia left axis deviation 59 bpm- No acute changes  Echocardiogram 08/26/2017 Study  Conclusions   - Left ventricle: The cavity size was normal. There was severe  concentric hypertrophy. Systolic function was vigorous. The  estimated ejection fraction was in the range of 65% to 70%. Wall  motion was normal; there were no regional wall motion  abnormalities. Doppler parameters are consistent with abnormal  left ventricular relaxation (grade 1 diastolic dysfunction). The  E/e&' ratio is between 8-15, suggesting indeterminate LV filling  pressure.  - Aortic valve: Sclerosis without stenosis. There was trivial  regurgitation.  - Mitral valve: Calcified annulus. Mildly thickened leaflets .  There was trivial regurgitation.  - Left atrium: The atrium was normal in size.  - Inferior vena cava: The vessel was normal in size. The  respirophasic diameter changes were in the normal range (>= 50%),  consistent with normal central venous pressure.   Impressions:   - LVEF 65-70%, severe LVH, normal wall motion, grade 1 DD,  indeterminate LV filling pressure, aortic valve sclerosis with  trivial AI, trivial MR, normal LA size, normal IVC.   Cardiac catheterization 08/26/2017  Mid LAD lesion is 30% stenosed.  The left ventricular systolic function is normal.  LV end diastolic pressure is normal.  The left ventricular ejection fraction is 55-65% by visual estimate.   1. No significant obstructive CAD 2. Normal LV function 3. Normal LVEDP  Plan: medical therapy  Diagnostic Dominance: Right    Intervention    Assessment & Plan   1.  Coronary artery disease -no chest pain today.  Underwent cardiac catheterization on 08/26/2017.  She was noted to have 30% mid LAD stenosis and no other disease.  Medical management was recommended. Continue carvedilol, furosemide Heart healthy low-sodium high-fiber diet-salty 6 given Increase physical activity as tolerated  Lower extremity edema-1-2+ pitting bilateral lower extremity  edema.  Appears to be  dependent edema versus venous stasis.  Unable to use compression stockings due to arthritis in bilateral hands. Continue furosemide Heart healthy low-sodium diet-salty 6 given Increase physical activity as tolerated Elevate lower extremities when not active May use Ace bandages for light compression Fluid restriction 48-64 ounces daily  COPD-no increased dyspnea today.  Reports she remains physically active. Continue DuoNeb, prednisone Heart healthy low-sodium diet-salty 6 given Increase physical activity as tolerated Follows with PCP   Disposition: Follow-up with Dr. Debara Pickett in 6 months.  Jossie Ng. Rozalynn Buege NP-C  07/31/2020, 12:47 PM [place] [address] Office [phone] Fax [phone]  Notice: This dictation was prepared with Dragon dictation along with smaller phrase technology. Any transcriptional errors that result from this process are unintentional and may not be corrected upon review.  I spent 20 minutes examining this patient, reviewing medications, and using patient centered shared decision making involving her cardiac care.  Prior to her visit I spent greater than 20 minutes reviewing her past medical history,  medications, and prior cardiac tests.

## 2020-07-31 ENCOUNTER — Encounter: Payer: Self-pay | Admitting: General Practice

## 2020-07-31 ENCOUNTER — Ambulatory Visit: Payer: Medicare Other | Admitting: General Practice

## 2020-07-31 ENCOUNTER — Other Ambulatory Visit: Payer: Self-pay

## 2020-07-31 VITALS — BP 130/60 | HR 59 | Ht <= 58 in | Wt 102.8 lb

## 2020-07-31 DIAGNOSIS — J449 Chronic obstructive pulmonary disease, unspecified: Secondary | ICD-10-CM

## 2020-07-31 DIAGNOSIS — I214 Non-ST elevation (NSTEMI) myocardial infarction: Secondary | ICD-10-CM | POA: Diagnosis not present

## 2020-07-31 DIAGNOSIS — I2510 Atherosclerotic heart disease of native coronary artery without angina pectoris: Secondary | ICD-10-CM | POA: Diagnosis not present

## 2020-07-31 DIAGNOSIS — R600 Localized edema: Secondary | ICD-10-CM | POA: Diagnosis not present

## 2020-07-31 DIAGNOSIS — I2584 Coronary atherosclerosis due to calcified coronary lesion: Secondary | ICD-10-CM

## 2020-07-31 NOTE — Patient Instructions (Signed)
Medication Instructions:  The current medical regimen is effective;  continue present plan and medications as directed. Please refer to the Current Medication list given to you today.; *If you need a refill on your cardiac medications before your next appointment, please call your pharmacy*  Special Instructions MAKE SURE TO APPLY ACE [place]. APPLY UPON DRESSING AND REMOVE BEFORE GOING TO BED AT NIGHT.  FOLLOW FLUID RESTRICTION OF 48-64 OUNCES DAILY-OF ALL YOUR FLUIDS.  PLEASE READ AND FOLLOW SALTY 6-ATTACHED-1,800mg  daily  PLEASE MAINTAIN PHYSICAL ACTIVITY AS TOLERATED  Follow-Up: Your next appointment:  6 month(s) In Person with K. Mali Hilty, MD OR IF UNAVAILABLE JESSE CLEAVER, FNP-C   Please call our office 2 months in advance to schedule this appointment   At [facility], you and your health needs are our priority.  As part of our continuing mission to provide you with exceptional heart care, we have created designated Provider Care Teams.  These Care Teams include your primary Cardiologist (physician) and Advanced Practice Providers (APPs -  Physician Assistants and Nurse Practitioners) who all work together to provide you with the care you need, when you need it.            6 SALTY THINGS TO AVOID     1,800MG  DAILY

## 2020-08-01 ENCOUNTER — Telehealth: Payer: Self-pay | Admitting: General Practice

## 2020-08-01 NOTE — Telephone Encounter (Signed)
Pt c/o medication issue:  1. Name of Medication: sleeping pills  2. How are you currently taking this medication (dosage and times per day)? Not currently taken   3. Are you having a reaction (difficulty breathing--STAT)? no  4. What is your medication issue? PT is calling in requesting information about to the medication sleeping pills DR.Marilynn Rail was suppose to prescribe.Please advise

## 2020-08-01 NOTE — Telephone Encounter (Signed)
Left message for patient to call back (phone # is for [place]) No mention in [place] NP note regarding sleeping pill prescription

## 2020-08-05 ENCOUNTER — Other Ambulatory Visit: Payer: Self-pay | Admitting: Rheumatology

## 2020-08-05 MED ORDER — PREDNISONE 10 MG PO TABS: 10.0000 mg | ORAL_TABLET | Freq: Every day | ORAL | 0 refills | Status: DC

## 2020-08-05 NOTE — Telephone Encounter (Signed)
Next Visit: 08/12/2020  Last Visit: 02/18/2020  Last Fill: 05/12/2020  Dx:  Rheumatoid arthritis of multiple sites with negative rheumatoid factor   Current Dose per office note on 02/18/2020, Prednisone 10 mg p.o. daily.  Okay to refill Prednisone?

## 2020-08-05 NOTE — Telephone Encounter (Signed)
Patient request refill on Prednisone.

## 2020-08-06 NOTE — Telephone Encounter (Signed)
Left message for pt to call if needed.

## 2020-08-12 ENCOUNTER — Encounter: Payer: Self-pay | Admitting: Physician Assistant

## 2020-08-12 ENCOUNTER — Ambulatory Visit (INDEPENDENT_AMBULATORY_CARE_PROVIDER_SITE_OTHER): Payer: Medicare Other | Admitting: Physician Assistant

## 2020-08-12 ENCOUNTER — Other Ambulatory Visit: Payer: Self-pay

## 2020-08-12 VITALS — BP 129/63 | HR 65 | Wt 105.0 lb

## 2020-08-12 DIAGNOSIS — M19042 Primary osteoarthritis, left hand: Secondary | ICD-10-CM

## 2020-08-12 DIAGNOSIS — M19071 Primary osteoarthritis, right ankle and foot: Secondary | ICD-10-CM

## 2020-08-12 DIAGNOSIS — M19041 Primary osteoarthritis, right hand: Secondary | ICD-10-CM | POA: Diagnosis not present

## 2020-08-12 DIAGNOSIS — M19072 Primary osteoarthritis, left ankle and foot: Secondary | ICD-10-CM

## 2020-08-12 DIAGNOSIS — M0630 Rheumatoid nodule, unspecified site: Secondary | ICD-10-CM | POA: Diagnosis not present

## 2020-08-12 DIAGNOSIS — I214 Non-ST elevation (NSTEMI) myocardial infarction: Secondary | ICD-10-CM

## 2020-08-12 DIAGNOSIS — M0609 Rheumatoid arthritis without rheumatoid factor, multiple sites: Secondary | ICD-10-CM

## 2020-08-12 DIAGNOSIS — Z79899 Other long term (current) drug therapy: Secondary | ICD-10-CM

## 2020-08-12 DIAGNOSIS — Z8709 Personal history of other diseases of the respiratory system: Secondary | ICD-10-CM

## 2020-08-12 DIAGNOSIS — I700 Atherosclerosis of aorta: Secondary | ICD-10-CM

## 2020-08-12 DIAGNOSIS — Z85118 Personal history of other malignant neoplasm of bronchus and lung: Secondary | ICD-10-CM

## 2020-08-12 DIAGNOSIS — M810 Age-related osteoporosis without current pathological fracture: Secondary | ICD-10-CM

## 2020-08-13 NOTE — Telephone Encounter (Signed)
Spoke with daughter and they have picked up Melatonin for a sleep aid for pt ./cy

## 2020-08-19 ENCOUNTER — Encounter: Payer: Self-pay | Admitting: Podiatry

## 2020-08-19 ENCOUNTER — Other Ambulatory Visit: Payer: Self-pay

## 2020-08-19 ENCOUNTER — Ambulatory Visit: Payer: Medicare Other | Admitting: Podiatry

## 2020-08-19 DIAGNOSIS — B351 Tinea unguium: Secondary | ICD-10-CM

## 2020-08-19 DIAGNOSIS — M79675 Pain in left toe(s): Secondary | ICD-10-CM

## 2020-08-19 DIAGNOSIS — L97522 Non-pressure chronic ulcer of other part of left foot with fat layer exposed: Secondary | ICD-10-CM

## 2020-08-19 DIAGNOSIS — M0579 Rheumatoid arthritis with rheumatoid factor of multiple sites without organ or systems involvement: Secondary | ICD-10-CM

## 2020-08-19 DIAGNOSIS — M79674 Pain in right toe(s): Secondary | ICD-10-CM | POA: Diagnosis not present

## 2020-08-20 NOTE — Progress Notes (Signed)
Subjective: Patient presents today with diabetes and cc of painful, discolored, thick toenails which interfere with daily activities. Pain is aggravated when wearing enclosed shoe gear. Pain is getting progressively worse and relieved with periodic professional debridement.  Her daughter, [name] is present during today's visit. Ms. Keisler relates her left foot is quite tender on today's visit. She denies any drainage from foot. She does continue to walk around her home in house slippers with adequate padding, but her chronic foot deformity contributes to painful, plantar calluses. She denies any fever, chills, nightsweats, nausea or vomiting.  Mayra Neer, MD is patient's PCP. Last visit was 07/25/2020..  Current Outpatient Medications on File Prior to Visit  Medication Sig Dispense Refill  . acetaminophen (TYLENOL) 325 MG tablet Take 325 mg by mouth every 6 (six) hours as needed. Takes as Needed    . carvedilol (COREG) 3.125 MG tablet Take 1 tablet (3.125 mg total) by mouth 2 (two) times daily with a meal. 60 tablet 0  . furosemide (LASIX) 20 MG tablet Take 20 mg by mouth every morning.    Marland Kitchen ipratropium-albuterol (DUONEB) 0.5-2.5 (3) MG/3ML SOLN Take 3 mLs by nebulization every 4 (four) hours as needed (wheezing, shortness of breath). Dx: J44.9 360 mL 5  . Multiple Vitamins-Minerals (ALIVE ONCE DAILY WOMENS) TABS Take 1 tablet by mouth daily.    . predniSONE (DELTASONE) 10 MG tablet Take 1 tablet (10 mg total) by mouth daily with breakfast. 90 tablet 0  . traMADol (ULTRAM) 50 MG tablet Take 50 mg by mouth every 6 (six) hours as needed.     No current facility-administered medications on file prior to visit.     Allergies  Allergen Reactions  . Latex Other (See Comments)    Blisters on her mouth  . Other Shortness Of Breath    Magic Mouthwash  . Boniva [Ibandronic Acid]     Muscle aches  . Darvocet [Propoxyphene N-Acetaminophen] Nausea And Vomiting  . Erythromycin Nausea And Vomiting   . Morphine And Related Nausea And Vomiting  . Oxycontin [Oxycodone Hcl] Nausea And Vomiting  . Oxycontin [Oxycodone]     Other reaction(s): Unknown  . Sulfa Antibiotics Nausea And Vomiting  . Tramadol Nausea And Vomiting  . Vicodin Hp [Hydrocodone-Acetaminophen]     Other reaction(s): dizzy  . Vicodin [Hydrocodone-Acetaminophen]     dizzy     Objective: There were no vitals filed for this visit.  Nozomi Mettler is a pleasant 87 y.o. female frail, in NAD.Marland Kitchen AAO X 3.  Vascular Examination: Capillary refill time to digits immediate b/l. Palpable pedal pulses b/l LE. Pedal hair sparse. Lower extremity skin temperature gradient within normal limits. +2 pitting edema b/l lower extremities.  Dermatological Examination: Pedal skin is thin shiny, atrophic b/l lower extremities. No open wounds bilaterally. No interdigital macerations bilaterally. Toenails 1-5 b/l elongated, discolored, dystrophic, thickened, crumbly with subungual debris and tenderness to dorsal palpation. No images are attached to the encounter.  Wound Location: submet head 3 left foot There is a minimal amount of devitalized tissue present in the wound. Predebridement Wound Measurement:  0.3  x 0.3 x 0 cm. Postdebridement Wound Measurement: 0.5 x 0.5 x 0.2 cm to level of subq Wound Base: Granular/Healthy Odor:  No odor Tracking/Tunneling:  None Peri-wound: Normal Exudate: None: wound tissue dry Blood Loss during debridement: 0 cc('s). Material in wound which inhibits healing/promotes adjacent tissue breakdown:  nonviable hyperkeratosis. Description of tissue removed from ulceration today:  nonviable hyperkeratosis. Sign(s) of clinical bacterial infection: no  clinical signs of infection noted on examination today.   Musculoskeletal Examination: Normal muscle strength 5/5 to all lower extremity muscle groups bilaterally. Plantarflexed metatarsal(s) 1-5 bilaterally. Hammertoes noted to the 1-5 bilaterally. Utilizes  wheelchair for mobility assistance.  Neurological Examination: Protective sensation intact 5/5 intact bilaterally with 10g monofilament b/l.  Assessment and Plan 1. Pain secondary to onychomycosis 2. Ulcer of left foot with fat layer exposed ([place]) 3. Rheumatoid Arthritis  - Ambulatory referral to Home Health: [facility] for skilled nursing for management of ulcer submet head 3 left foot. -Patient was evaluated and treated and all questions answered.  -Patient/POA/Family member educated on diagnosis and treatment plan of routine ulcer debridement/wound care.  -Ulceration debridement achieved utilizing sharp excisional debridement with sterile scalpel blade and sterile currette. Type/amount of devitalized tissue removed: nonviable hyperkeratosis. -Today's ulcer size post-debridement: 0.5 x 0.5 x 0.2 cm. -Ulceration cleansed with wound cleanser. Iodosorb Gel applied to base of ulceration and secured with light dressing. -Wound responded welll to today's debridement. -Patient risk factors affecting healing of ulcer: foot deformity, immunosuppressive medications, history of recurrent wound -Orders for Home Health Wound care to be faxed to: [facility] for skilled nursing of ulcer submet head 3 left foot. -Frequency of debridements needed to achieve healing: biweekly. -Toenails 1-5 b/l were debrided in length and girth with sterile nail nippers and dremel without iatrogenic bleeding.  -Patient to report any pedal injuries to medical professional immediately. -Patient referred to Dr. Hardie Pulley for follow-up of ulcer submet head 3 left foot. -Patient/POA to call should there be question/concern in the interim.  Return in about 9 weeks (around 10/21/2020).  Marzetta Board, DPM

## 2020-08-21 ENCOUNTER — Telehealth: Payer: Self-pay | Admitting: Podiatry

## 2020-08-21 NOTE — Telephone Encounter (Signed)
[facility] called stating the [facility] have [facility], and need to be resent. Please advise.

## 2020-08-25 ENCOUNTER — Telehealth: Payer: Self-pay | Admitting: *Deleted

## 2020-08-25 NOTE — Telephone Encounter (Signed)
Donita w/ [facility] is calling for nursing orders for patient.(1 x1 week,2x 8 weeks). Please advise.

## 2020-08-25 NOTE — Telephone Encounter (Signed)
Called [facility] w/ [facility],was given verbal order per Dr Elisha Ponder, verbalized understanding but stated that patient's insurance will cover for a period of 9 weeks. Please advise.

## 2020-08-25 NOTE — Telephone Encounter (Signed)
Twice weekly for 6 weeks please.

## 2020-08-29 NOTE — Telephone Encounter (Signed)
Okay for 9 weeks.

## 2020-09-01 ENCOUNTER — Telehealth: Payer: Self-pay | Admitting: *Deleted

## 2020-09-01 NOTE — Telephone Encounter (Signed)
Returned call to [facility] w/[facility] and gave verbal per Dr Elisha Ponder approval for 9 wks instead of 6 wks. She verbalized understanding.

## 2020-09-09 ENCOUNTER — Other Ambulatory Visit: Payer: Self-pay

## 2020-09-09 ENCOUNTER — Ambulatory Visit: Payer: Medicare Other | Admitting: Podiatry

## 2020-09-09 DIAGNOSIS — I872 Venous insufficiency (chronic) (peripheral): Secondary | ICD-10-CM

## 2020-09-09 DIAGNOSIS — L84 Corns and callosities: Secondary | ICD-10-CM | POA: Diagnosis not present

## 2020-09-09 DIAGNOSIS — L97522 Non-pressure chronic ulcer of other part of left foot with fat layer exposed: Secondary | ICD-10-CM

## 2020-09-09 NOTE — Progress Notes (Signed)
  Subjective:  Patient ID: [name], female    DOB: 05/28/1929,  MRN: [id number]  Chief Complaint  Patient presents with  . Wound Check    Denies any f/c/n/v/pain.    87 y.o. female presents for wound care. Hx confirmed with patient. Here for evaluation of the calluses to both feet concerned for ulcer. Objective:  Physical Exam: Wound Location: submet 3 bilat Wound Measurement: pinpoint right Peri-wound: Calloused Exudate: None: wound tissue dry wound without warmth, erythema, signs of acute infection  Assessment:   1. Ulcer of left foot with fat layer exposed ([place])   2. Pre-ulcerative calluses   3. Venous (peripheral) insufficiency    Plan:  Patient was evaluated and treated and all questions answered.  Pre-Ulcer bilateral feet -Gentle debridement today only pinpoint ulceration right -Dispense metatarsal sling pads to promote padding and prevent further ulceration -F/u PRN  Venous Insufficiency  -Dispensed tubigrip bilat.  No follow-ups on file.

## 2020-09-18 ENCOUNTER — Ambulatory Visit: Payer: Medicare Other | Admitting: Podiatry

## 2020-10-02 ENCOUNTER — Telehealth: Payer: Medicare Other | Admitting: *Deleted

## 2020-10-02 NOTE — Telephone Encounter (Signed)
Selinda [facility] w/ [facility] is calling to request verbal orders for skilled nursing for wound care of the foot(2 times wkly, next 4 weeks). Please advise, may leave vmessage.

## 2020-10-06 NOTE — Telephone Encounter (Signed)
Please approve  [place] and dry sterile dressing to wound twice weekly.

## 2020-10-07 ENCOUNTER — Telehealth: Payer: Self-pay | Admitting: *Deleted

## 2020-10-07 NOTE — Telephone Encounter (Signed)
Approved. Can you call and let her know, please? Thanks so much!

## 2020-10-07 NOTE — Telephone Encounter (Signed)
Amy w/ [facility]([phone]) calling to get a verbal order to extend patient's home nursing visits,starting this week to 1 times weekly for 2 more weeks.   This will carry her out til her certification expires in July. Should be able to discharge her at this point. Please advise.

## 2020-10-07 NOTE — Telephone Encounter (Signed)
Returned call to [facility] w/ [place] and gave verbal wound care orders per Dr Damaris Schooner understanding.

## 2020-10-07 NOTE — Telephone Encounter (Signed)
Returned call to [place], no answer, left vmessage for verbal orders per Dr March Rummage instructions.

## 2020-10-21 ENCOUNTER — Ambulatory Visit: Payer: Medicare Other | Admitting: Podiatry

## 2020-10-24 ENCOUNTER — Telehealth: Payer: Self-pay | Admitting: *Deleted

## 2020-10-24 NOTE — Telephone Encounter (Signed)
Labs received from:Dr. Mayra Neer   Drawn on:10/22/2020  Reviewed by:Hazel Sams, PA-C   Labs drawn:CBC, CMP, Lipid Panel  Results: RBC 3.62, Hgb 11.7, Hct 34.9, MPV 6.0, NE% 87.0, LY% 8.9, Total Protein 5.9, ALP 31, MO % 3.6, LY # 0.70, Glucose 138, BUN 31, GFR 47, Chloride 108 Cholesterol 220, HDL 144, LDL 128  Patient is on Prednisone 10 mg po daily.

## 2020-10-27 ENCOUNTER — Ambulatory Visit: Payer: Medicare Other | Admitting: Podiatry

## 2020-10-27 ENCOUNTER — Other Ambulatory Visit: Payer: Self-pay | Admitting: Rheumatology

## 2020-10-27 NOTE — Telephone Encounter (Signed)
Patient requesting refill on Prednisone 10 mg, 90 day supply sent to [facility] on [address].

## 2020-10-28 ENCOUNTER — Ambulatory Visit: Payer: Medicare Other | Admitting: Podiatry

## 2020-10-28 ENCOUNTER — Other Ambulatory Visit: Payer: Self-pay

## 2020-10-28 ENCOUNTER — Encounter: Payer: Self-pay | Admitting: Podiatry

## 2020-10-28 DIAGNOSIS — L84 Corns and callosities: Secondary | ICD-10-CM

## 2020-10-28 DIAGNOSIS — M79674 Pain in right toe(s): Secondary | ICD-10-CM | POA: Diagnosis not present

## 2020-10-28 DIAGNOSIS — B351 Tinea unguium: Secondary | ICD-10-CM

## 2020-10-28 DIAGNOSIS — M79675 Pain in left toe(s): Secondary | ICD-10-CM | POA: Diagnosis not present

## 2020-10-28 DIAGNOSIS — M79672 Pain in left foot: Secondary | ICD-10-CM

## 2020-10-28 DIAGNOSIS — M79671 Pain in right foot: Secondary | ICD-10-CM

## 2020-10-28 DIAGNOSIS — M0579 Rheumatoid arthritis with rheumatoid factor of multiple sites without organ or systems involvement: Secondary | ICD-10-CM

## 2020-10-28 MED ORDER — PREDNISONE 10 MG PO TABS: 10.0000 mg | ORAL_TABLET | Freq: Every day | ORAL | 0 refills | Status: DC

## 2020-10-28 NOTE — Telephone Encounter (Signed)
Next Visit: 01/27/2021  Last Visit: 08/12/2020  Last Fill: 08/05/2020  Dx: Rheumatoid arthritis of multiple sites with negative rheumatoid factor  Current Dose per office note on 08/12/2020: Prednisone 10 mg by mouth daily  Okay to refill Prednisone?

## 2020-10-31 NOTE — Progress Notes (Signed)
Subjective: [name] is a pleasant 87 y.o. female patient seen today for at risk foot care with h/o diabetes and RA. She has severe pedal deformities b/l. She is seen for plantar lesions which ulcerate periodically. Most recently, her plantar ulcers have healed. She is also seen for  painful thick toenails that are difficult to trim. Pain interferes with ambulation. Aggravating factors include wearing enclosed shoe gear. Pain is relieved with periodic professional debridement.  She is accompanied by her daughter, [name], on today's visit.  PCP is Mayra Neer, MD.   Allergies  Allergen Reactions   Latex Other (See Comments)    Blisters on her mouth   Other Shortness Of Breath    Magic Mouthwash   Boniva [Ibandronic Acid]     Muscle aches   Darvocet [Propoxyphene N-Acetaminophen] Nausea And Vomiting   Erythromycin Nausea And Vomiting   Morphine And Related Nausea And Vomiting   Oxycontin [Oxycodone Hcl] Nausea And Vomiting   Oxycontin [Oxycodone]     Other reaction(s): Unknown   Sulfa Antibiotics Nausea And Vomiting   Tramadol Nausea And Vomiting   Vicodin Hp [Hydrocodone-Acetaminophen]     Other reaction(s): dizzy   Vicodin [Hydrocodone-Acetaminophen]     dizzy    Objective: Physical Exam  General: [name] is a pleasant 87 y.o. Caucasian female, frail, in NAD. AAO x 3.   Vascular:  Capillary refill time to digits immediate b/l. Palpable pedal pulses b/l LE. Pedal hair absent. Lower extremity skin temperature gradient within normal limits. +1 pitting edema b/l lower extremities. Evidence of chronic venous insufficiency b/l lower extremities. No ischemia or gangrene noted b/l lower extremities.  Dermatological:  Pedal skin is thin shiny, atrophic b/l lower extremities. No interdigital macerations b/l lower extremities Toenails 1-5 b/l elongated, discolored, dystrophic, thickened, crumbly with subungual debris and tenderness to dorsal palpation. Preulcerative lesion  noted submet head 2 left foot, submet head 2 right foot, and submet head 3 left foot. There is visible subdermal hemorrhage. There is no surrounding erythema, no edema, no drainage, no odor, no fluctuance. Skin changes consistent with venous stasis noted b/l lower extremities  Musculoskeletal:  Noted disuse atrophy b/l LE. No pain crepitus or joint limitation noted with ROM b/l. Plantarflexed metatarsal(s) 1-5 bilaterally. Severe hammertoe deformity noted 1-5 bilaterally. Utilizes wheelchair for mobility assistance.  Neurological:  Protective sensation intact 5/5 intact bilaterally with 10g monofilament b/l. Vibratory sensation intact b/l.  Assessment and Plan:  1. Pre-ulcerative calluses   2. Pain due to onychomycosis of toenails of both feet   3. Rheumatoid arthritis involving multiple sites with positive rheumatoid factor (HCC)   4. Pain in both feet      -Examined patient. -Patient to continue soft, supportive shoe gear daily. -Toenails 1-5 b/l were debrided in length and girth with sterile nail nippers and dremel without iatrogenic bleeding.  -Preulcerative lesion pared submet head 2 left foot, submet head 2 right foot, and submet head 3 left foot. Iodosorb Gel and band-aids applied. Daughter [name] to contact office should there be any concerns in the interim. -Patient to report any pedal injuries to medical professional immediately. -Patient/POA to call should there be question/concern in the interim.  Return in about 9 weeks (around 12/30/2020).  Marzetta Board, DPM

## 2020-12-17 ENCOUNTER — Other Ambulatory Visit: Payer: Self-pay

## 2020-12-17 ENCOUNTER — Encounter: Payer: Self-pay | Admitting: Podiatry

## 2020-12-17 ENCOUNTER — Ambulatory Visit: Payer: Medicare Other | Admitting: Podiatry

## 2020-12-17 DIAGNOSIS — B351 Tinea unguium: Secondary | ICD-10-CM

## 2020-12-17 DIAGNOSIS — I739 Peripheral vascular disease, unspecified: Secondary | ICD-10-CM | POA: Diagnosis not present

## 2020-12-17 DIAGNOSIS — M0579 Rheumatoid arthritis with rheumatoid factor of multiple sites without organ or systems involvement: Secondary | ICD-10-CM | POA: Diagnosis not present

## 2020-12-17 DIAGNOSIS — M79674 Pain in right toe(s): Secondary | ICD-10-CM | POA: Diagnosis not present

## 2020-12-17 DIAGNOSIS — L84 Corns and callosities: Secondary | ICD-10-CM | POA: Diagnosis not present

## 2020-12-17 DIAGNOSIS — M79675 Pain in left toe(s): Secondary | ICD-10-CM

## 2020-12-22 NOTE — Progress Notes (Signed)
Subjective: [name] is a pleasant 87 y.o. female patient seen today for thick, elongated toenails b/l lower extremities which are tender when wearing enclosed shoe gear. and preulcerative {corn(s), callus(es) b/l feet.   Patient states left foot is tender today. She denies any drainage from her lesions today. It is tender to ambulate due to buildup of her lesions.  Her daughter, [name], is present during today's visit.  PCP is Mayra Neer, MD. Last visit was: six months ago.  Allergies  Allergen Reactions   Latex Other (See Comments)    Blisters on her mouth   Other Shortness Of Breath    Magic Mouthwash   Boniva [Ibandronic Acid]     Muscle aches   Darvocet [Propoxyphene N-Acetaminophen] Nausea And Vomiting   Erythromycin Nausea And Vomiting   Morphine And Related Nausea And Vomiting   Oxycontin [Oxycodone Hcl] Nausea And Vomiting   Oxycontin [Oxycodone]     Other reaction(s): Unknown   Sulfa Antibiotics Nausea And Vomiting   Tramadol Nausea And Vomiting   Vicodin Hp [Hydrocodone-Acetaminophen]     Other reaction(s): dizzy   Vicodin [Hydrocodone-Acetaminophen]     dizzy    Objective: Physical Exam  General: [name] is a pleasant 87 y.o. Caucasian female, WD, WN in NAD. AAO x 3.   Vascular:  Capillary refill time to digits immediate b/l. Palpable DP pulse(s) b/l lower extremities Palpable PT pulse(s) b/l lower extremities Pedal hair absent. Lower extremity skin temperature gradient warm to cool. +1 pitting edema b/l lower extremities. Evidence of chronic venous insufficiency b/l lower extremities. No ischemia or gangrene noted b/l lower extremities.  Dermatological:  Pedal skin is thin shiny, atrophic b/l lower extremities. Toenails 1-5 b/l elongated, discolored, dystrophic, thickened, crumbly with subungual debris and tenderness to dorsal palpation. Preulcerative lesion noted submet head 2 left foot, submet head 2 right foot, and submet head 3 left foot. There  is visible subdermal hemorrhage. There is no surrounding erythema, no edema, no drainage, no odor, no fluctuance.  Musculoskeletal:  Noted disuse atrophy b/l lower extremities. Severe hammertoe deformity noted 1-5 bilaterally. Utilizes wheelchair for mobility assistance.  Neurological:  Protective sensation intact 5/5 intact bilaterally with 10g monofilament b/l.  Assessment and Plan:  1. Pain due to onychomycosis of toenails of both feet   2. Pre-ulcerative calluses   3. Rheumatoid arthritis involving multiple sites with positive rheumatoid factor ([place])   -Examined patient. -Daughter [name] was instructed to apply Iodosorb Gel to lesions on plantar aspect of feet 2-3 times per week. -Patient to continue soft, supportive shoe gear daily. -Toenails 1-5 b/l were debrided in length and girth with sterile nail nippers and dremel without iatrogenic bleeding.  -Preulcerative lesion pared submet head 2 left foot, submet head 2 right foot, and submet head 3 left foot. Total number pared=3. Iodosorb Gel and light dressing applied -Patient to report any pedal injuries to medical professional immediately. -Patient/POA to call should there be question/concern in the interim.  Return in about 3 months (around 03/18/2021).  Marzetta Board, DPM

## 2020-12-29 ENCOUNTER — Ambulatory Visit: Payer: Medicare Other | Admitting: Podiatry

## 2020-12-30 ENCOUNTER — Ambulatory Visit: Payer: Medicare Other | Admitting: Podiatry

## 2021-01-13 NOTE — Progress Notes (Signed)
Office Visit Note  Patient: [name]             Date of Birth: Jun 28, 1929           MRN: [id number]             PCP: Mayra Neer, MD Referring: Mayra Neer, MD Visit Date: 01/27/2021 Occupation: @GUAROCC @  Subjective:  Medication monitoring   History of Present Illness: Shemica Meath is a 87 y.o. female with history of seronegative rheumatoid arthritis, osteoarthritis, and osteoporosis.  She is taking prednisone 10 mg daily.  She feels as though the current dose of prednisone has been effective at managing her rheumatoid arthritis.  She has occasional pain and inflammation in her hands and states that since her last office visit there were 2 days where she had to take 20 mg to alleviate her symptoms.  Her overall joint stiffness has been lasting all day.  She denies any recent falls.  She is very fearful of falling.  She uses a Rollator walker at home with ambulation.  Activities of Daily Living:  Patient reports morning stiffness for all day. Patient Denies nocturnal pain.  Difficulty dressing/grooming: Denies Difficulty climbing stairs: Reports Difficulty getting out of chair: Reports Difficulty using hands for taps, buttons, cutlery, and/or writing: Reports  Review of Systems  Constitutional:  Positive for fatigue.  HENT:  Negative for mouth sores, mouth dryness and nose dryness.   Eyes:  Negative for pain, itching and dryness.  Respiratory:  Positive for shortness of breath. Negative for difficulty breathing.   Cardiovascular:  Positive for swelling in legs/feet. Negative for chest pain and palpitations.  Gastrointestinal:  Negative for blood in stool, constipation and diarrhea.  Endocrine: Negative for increased urination.  Genitourinary:  Negative for difficulty urinating.  Musculoskeletal:  Positive for joint pain, joint pain, joint swelling and morning stiffness. Negative for myalgias, muscle tenderness and myalgias.  Skin:  Negative for color change, rash  and redness.  Allergic/Immunologic: Negative for susceptible to infections.  Neurological:  Positive for numbness. Negative for dizziness, headaches, memory loss and weakness.  Hematological:  Positive for bruising/bleeding tendency.  Psychiatric/Behavioral:  Positive for sleep disturbance. Negative for confusion.    PMFS History:  Patient Active Problem List   Diagnosis Date Noted   Acute non-ST segment elevation myocardial infarction ([place]) 04/01/2020   Ataxia 04/01/2020   Frail elderly 04/01/2020   Heart disease 04/01/2020   Hypercholesterolemia 04/01/2020   Insomnia 04/01/2020   Ovarian mass 04/01/2020   Malignant neoplasm of lung ([place]) 04/01/2020   Early stage nonexudative age-related macular degeneration of both eyes 02/26/2020   Posterior vitreous detachment of both eyes 02/26/2020   Abnormal CT of the chest 09/01/2017   Adult failure to thrive 09/01/2017   Neurocognitive disorder 08/30/2017   NSTEMI (non-ST elevated myocardial infarction) ([place]) 08/25/2017   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 Secondary to either irritation by dentures, vs. secondary to lung neoplasm 12/23/2012   Rheumatoid arthritis-severe 12/23/2012   COPD, mild ([place]) 12/23/2012  Osteoporosis 12/23/2012   Primary cancer of right upper lobe of lung ([place]) 03/21/2012    Past Medical History:  Diagnosis Date   Cancer (HCC)    lung ca   COPD (chronic obstructive  pulmonary disease) (HCC)    Hernia, umbilical    Hip pain, right    History of radiation therapy 05/02/12-05/04/12,&05/09/12   rul lung 54Gy/70fx   HLD (hyperlipidemia)    Lung mass    Myocardial infarction ([place]) 08/2017   Osteoporosis    PAC (premature atrial contraction)    RA (rheumatoid arthritis) ([place])    Shingles     Family History  Problem Relation Age of Onset   Cancer Daughter    Cancer Mother    Past Surgical History:  Procedure Laterality Date   LEFT HEART CATH AND CORONARY ANGIOGRAPHY N/A 08/26/2017   Procedure: LEFT HEART CATH AND CORONARY ANGIOGRAPHY;  Surgeon: Martinique, Peter M, MD;  Location: [facility];  Service: Cardiovascular;  Laterality: N/A;   right lobectomy     Social History   Social History Narrative   Lives in ALF at [facility].  08/2017 resident of [facility].    01-19-18 Unable to ask abuse questions daughter with her today.   Immunization History  Administered Date(s) Administered   Influenza Split 02/25/1998, 02/11/2001, 02/10/2002, 03/02/2003, 01/30/2005, 01/17/2006, 01/23/2007, 02/02/2008, 01/18/2011, 02/09/2012   Influenza Whole 02/09/2012   Influenza-Unspecified 01/17/2017   Moderna Sars-Covid-2 Vaccination 11/28/2019, 12/27/2019   Pneumococcal Polysaccharide-23 02/25/1998, 12/27/2003   Pneumococcal-Unspecified 04/19/2014   Td 08/05/1998, 03/03/2009     Objective: Vital Signs: BP (!) 175/62 (BP Location: Left Arm, Patient Position: Sitting, Cuff Size: Normal)   Pulse 61   Wt 96 lb 3.2 oz (43.6 kg)   BMI 20.82 kg/m    Physical Exam Vitals and nursing note reviewed.  Constitutional:      Appearance: She is well-developed.  HENT:     Head: Normocephalic and atraumatic.  Eyes:     Conjunctiva/sclera: Conjunctivae normal.  Pulmonary:     Effort: Pulmonary effort is normal.  Abdominal:     Palpations: Abdomen is soft.  Musculoskeletal:     Cervical back: Normal range of motion.  Skin:    General:  Skin is warm and dry.     Capillary Refill: Capillary refill takes less than 2 seconds.  Neurological:     Mental Status: She is alert and oriented to person, place, and time.  Psychiatric:        Behavior: Behavior normal.     Musculoskeletal Exam: Patient remained seated during examination.  C-spine has limited range of motion with flexion and lateral rotation.  Thoracic kyphosis noted.  No midline spinal tenderness or SI joint tenderness noted.  Shoulder joints have good range of motion with some discomfort and stiffness.  Elbow joints have good range of motion with no tenderness or joint swelling.  Synovial thickening over both wrist joints.  Tenderness and synovitis of the left third PIP joint.  Incomplete fist formation.  Ulnar deviation and contractures of several MCP and PIP joints noted.  Knee joints have good range of motion with no warmth or effusion.  Pedal edema noted bilaterally.  CDAI Exam: CDAI Score: -- Patient Global: --; Provider Global: -- Swollen: --; Tender: -- Joint Exam 01/27/2021   No joint exam has been documented for this visit   There is currently no information documented on the homunculus. Go to the Rheumatology activity and complete the homunculus joint exam.  Investigation: No additional findings.  Imaging: No results found.  Recent Labs: Lab Results  Component Value Date   WBC 6.1 09/21/2017   HGB 9.2 (A) 09/21/2017   PLT 171 09/21/2017   NA 139 02/09/2019   K 4.5 02/09/2019   CL 106 02/09/2019   CO2 26 02/09/2019   GLUCOSE 119 (H) 02/09/2019   BUN 33 (H) 02/09/2019   CREATININE 1.07 02/09/2019   BILITOT 0.9 08/26/2017   ALKPHOS 39 08/26/2017   AST 20 08/26/2017   ALT 11 (L) 08/26/2017   PROT 5.3 (L) 08/26/2017   ALBUMIN 2.8 (L) 08/26/2017   CALCIUM 9.1 02/09/2019   GFRAA 51 (L) 01/18/2018    Speciality Comments: No specialty comments available.  Procedures:  No procedures performed Allergies: Latex, Other, Boniva [ibandronic  acid], Darvocet [propoxyphene n-acetaminophen], Erythromycin, Morphine and related, Oxycontin [oxycodone hcl], Oxycontin [oxycodone], Sulfa antibiotics, Tramadol, Vicodin hp [hydrocodone-acetaminophen], and Vicodin [hydrocodone-acetaminophen]   Assessment / Plan:     Visit Diagnoses: Rheumatoid arthritis of multiple sites with negative rheumatoid factor ([place]): She remains on prednisone 10 mg 1 tablet daily.  She has been unable to taper the dose of prednisone.  She is not currently taking any immunosuppressive agents.  Since her last office visit in April there were 2 days that she had to take 20 mg of prednisone to alleviate the pain and inflammation she was experiencing.  Overall she has found the current dose of prednisone to be effective at managing her rheumatoid arthritis.  She has chronic pain and stiffness in both wrist joints in both hands.  Ulnar deviation but contractures in several MCP and PIP joints were noted.  Discussed the importance of muscle strengthening and joint protection.  She will continue on prednisone 10 mg 1 tablet daily.  She was advised to notify us if she develops any new or worsening symptoms.  She will follow-up in the office in 6 months.  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   High risk medication use - Prednisone 10 mg by mouth daily. She does not want to take SSZ or PLQ.  She is not currently taking any immunosuppressive agents.  She has an upcoming physical with her PCP and will have updated lab work forwarded to our office to review.  She is aware of the risks of long-term prednisone use.  Primary osteoarthritis of both hands: Chronic pain and stiffness in both hands.  PIP thickening noted.  Contractures several PIPs and ulnar deviation of both hands noted.  Discussed the importance of joint protection and muscle strengthening.  Primary osteoarthritis of both feet: She has not been experiencing any increased discomfort in her feet recently due to underlying  osteoarthritis.  She has severe pedal edema bilaterally.  She uses an Ace wrap on a daily basis to help with the fluid retention.  Age-related osteoporosis without current pathological fracture - DEXA ordered by Dr. Brigitte Pulse per patient. She has declined treatment in the past.  She has not had any recent falls or fractures.  She uses a Rollator walker to assist with ambulation.  Discussed the importance of lower extremity muscle strengthening and fall prevention.  Other medical conditions are listed as follows:   History of lung cancer  NSTEMI (non-ST elevated myocardial infarction) ([place])  Aortic calcification ([place])  History of COPD  Orders: No orders of the defined types were placed in this encounter.  No orders of the defined types were placed in this encounter.    Follow-Up Instructions: Return in 6 months (on  07/28/2021) for Rheumatoid arthritis, Osteoarthritis, Osteoporosis.   Ofilia Neas, PA-C  Note - This record has been created using Dragon software.  Chart creation errors have been sought, but may not always  have been located. Such creation errors do not reflect on  the standard of medical care.

## 2021-01-21 ENCOUNTER — Other Ambulatory Visit: Payer: Self-pay

## 2021-01-21 MED ORDER — PREDNISONE 10 MG PO TABS: 10.0000 mg | ORAL_TABLET | Freq: Every day | ORAL | 0 refills | Status: DC

## 2021-01-21 NOTE — Telephone Encounter (Signed)
Patient called requesting prescription refill of Prednisone 10 mg (90 day supply) to be sent to [facility] at [place].

## 2021-01-21 NOTE — Telephone Encounter (Signed)
Next Visit: 01/27/2021  Last Visit: 08/12/2020   Last Fill: 10/28/2020  Dx:  Rheumatoid arthritis of multiple sites with negative rheumatoid factor   Current Dose per office note on 08/12/2020: Prednisone 10 mg by mouth daily  Okay to refill Prednisone?

## 2021-01-26 ENCOUNTER — Ambulatory Visit: Payer: Medicare Other | Admitting: General Practice

## 2021-01-27 ENCOUNTER — Ambulatory Visit: Payer: Medicare Other | Admitting: Physician Assistant

## 2021-01-27 ENCOUNTER — Encounter: Payer: Self-pay | Admitting: Physician Assistant

## 2021-01-27 ENCOUNTER — Other Ambulatory Visit: Payer: Self-pay

## 2021-01-27 VITALS — BP 175/62 | HR 61 | Wt 96.2 lb

## 2021-01-27 DIAGNOSIS — I214 Non-ST elevation (NSTEMI) myocardial infarction: Secondary | ICD-10-CM

## 2021-01-27 DIAGNOSIS — M19041 Primary osteoarthritis, right hand: Secondary | ICD-10-CM | POA: Diagnosis not present

## 2021-01-27 DIAGNOSIS — Z8709 Personal history of other diseases of the respiratory system: Secondary | ICD-10-CM

## 2021-01-27 DIAGNOSIS — I700 Atherosclerosis of aorta: Secondary | ICD-10-CM

## 2021-01-27 DIAGNOSIS — M19071 Primary osteoarthritis, right ankle and foot: Secondary | ICD-10-CM

## 2021-01-27 DIAGNOSIS — M0630 Rheumatoid nodule, unspecified site: Secondary | ICD-10-CM | POA: Diagnosis not present

## 2021-01-27 DIAGNOSIS — Z79899 Other long term (current) drug therapy: Secondary | ICD-10-CM

## 2021-01-27 DIAGNOSIS — M0609 Rheumatoid arthritis without rheumatoid factor, multiple sites: Secondary | ICD-10-CM

## 2021-01-27 DIAGNOSIS — M810 Age-related osteoporosis without current pathological fracture: Secondary | ICD-10-CM

## 2021-01-27 DIAGNOSIS — M19042 Primary osteoarthritis, left hand: Secondary | ICD-10-CM

## 2021-01-27 DIAGNOSIS — Z85118 Personal history of other malignant neoplasm of bronchus and lung: Secondary | ICD-10-CM

## 2021-01-27 DIAGNOSIS — M19072 Primary osteoarthritis, left ankle and foot: Secondary | ICD-10-CM

## 2021-02-01 NOTE — Progress Notes (Signed)
Cardiology Clinic Note   Patient Name: [name] Date of Encounter: 02/02/2021  Primary Care Provider:  Mayra Neer, MD Primary Cardiologist:  Pixie Casino, MD  Patient Profile    [name] 87 year old female presents to the clinic today for follow-up evaluation of her coronary artery disease.  Past Medical History    Past Medical History:  Diagnosis Date   Cancer ([place])    lung ca   COPD (chronic obstructive pulmonary disease) (HCC)    Hernia, umbilical    Hip pain, right    History of radiation therapy 05/02/12-05/04/12,&05/09/12   rul lung 54Gy/27fx   HLD (hyperlipidemia)    Lung mass    Myocardial infarction ([place]) 08/2017   Osteoporosis    PAC (premature atrial contraction)    RA (rheumatoid arthritis) ([place])    Shingles    Past Surgical History:  Procedure Laterality Date   LEFT HEART CATH AND CORONARY ANGIOGRAPHY N/A 08/26/2017   Procedure: LEFT HEART CATH AND CORONARY ANGIOGRAPHY;  Surgeon: Martinique, Peter M, MD;  Location: [facility];  Service: Cardiovascular;  Laterality: N/A;   right lobectomy      Allergies  Allergies  Allergen Reactions   Latex Other (See Comments)    Blisters on her mouth   Other Shortness Of Breath    Magic Mouthwash   Boniva [Ibandronic Acid]     Muscle aches   Darvocet [Propoxyphene N-Acetaminophen] Nausea And Vomiting   Erythromycin Nausea And Vomiting   Morphine And Related Nausea And Vomiting   Oxycontin [Oxycodone Hcl] Nausea And Vomiting   Oxycontin [Oxycodone]     Other reaction(s): Unknown   Sulfa Antibiotics Nausea And Vomiting   Tramadol Nausea And Vomiting   Vicodin Hp [Hydrocodone-Acetaminophen]     Other reaction(s): dizzy   Vicodin [Hydrocodone-Acetaminophen]     dizzy    History of Present Illness    [name] has a PMH of lung cancer, COPD, PACs, dyslipidemia, coronary artery disease, and hyperlipidemia.  She was hospitalized 5/19 with acute shortness of breath and nausea.  She was found  to have elevated troponins and admitted by cardiology.  She underwent cardiac catheterization which showed mild coronary artery disease and normal LV function.  Her echocardiogram showed normal LV function with severe LVH.  She received diuresis as well as low-dose beta-blocker.   She followed up with Dr. Debara Pickett on 01/25/2018.  During that time she continued to do well.  She denied any recurrent symptoms.  She was noted to have continued lower extremity edema that was felt to be dependent.  She was also noted to have history of PVD and was intolerant of compression stockings.   She was lost to follow-up and presented to the clinic for follow-up 07/31/2020 .  She stated she had daily lower extremity edema that dissipated overnight.  She had severe bilateral arthritis in her hands and was unable to use compression stockings.  She reported dietary indiscretion.  She did do some walking throughout the day around her independent living facility.  She also reported that she consumed a large amount of fluid through the day as well.  We discussed low-salt diet, fluid restriction, light compression with Ace bandages, and allowing someone to assist her with her care needs.  Her lower extremity edema appeared to be more related to dependent edema versus venous stasis.  I gave her the salty 6 diet sheet, asked her to increase her physical activity as tolerated, follow fluid restriction and planned follow-up with  for 6 months.   She presents to the clinic today for follow-up evaluation states she feels well.  She continues to elevate her lower extremities throughout the day to reduce her swelling.  She does have stable chronic venous stasis.  She reports that she is not as active due to her arthritis.  Her only complaint today is some respiratory congestion when she wakes up in the morning.  She reports that as she gets going her breathing returns to its baseline.  She presents to the clinic today with her daughter who has  been doing her food shopping.  I will give her the salty 6 diet sheet, have her maintain her physical activity, have her continue to elevate her lower extremities when active, and plan follow-up in 6 to 9 months.  Today she denies chest pain, shortness of breath,  fatigue, palpitations, melena, hematuria, hemoptysis, diaphoresis, weakness, presyncope, syncope, orthopnea, and PND.  Home Medications    Prior to Admission medications   Medication Sig Start Date End Date Taking? Authorizing Provider  acetaminophen (TYLENOL) 325 MG tablet Take 325 mg by mouth every 6 (six) hours as needed. Takes as Needed    [provider]  carvedilol (COREG) 3.125 MG tablet Take 1 tablet (3.125 mg total) by mouth 2 (two) times daily with a meal. 10/14/17   Medina-Vargas, Monina C, NP  furosemide (LASIX) 20 MG tablet Take 20 mg by mouth every morning. 01/31/19   [provider]  ipratropium-albuterol (DUONEB) 0.5-2.5 (3) MG/3ML SOLN Take 3 mLs by nebulization every 4 (four) hours as needed (wheezing, shortness of breath). Dx: J44.9 Patient not taking: Reported on 01/27/2021 02/20/18   Marshell Garfinkel, MD  Multiple Vitamins-Minerals (ALIVE ONCE DAILY WOMENS) TABS Take 1 tablet by mouth daily.    [provider]  predniSONE (DELTASONE) 10 MG tablet Take 1 tablet (10 mg total) by mouth daily with breakfast. 01/21/21   Ofilia Neas, PA-C  traMADol (ULTRAM) 50 MG tablet Take 50 mg by mouth every 6 (six) hours as needed. 10/16/19   [provider]    Family History    Family History  Problem Relation Age of Onset   Cancer Daughter    Cancer Mother    She indicated that her mother is deceased. She indicated that her father is deceased. She indicated that only one of her two sisters is alive. She indicated that her brother is deceased. She indicated that her daughter is alive.  Social History    Social History   Socioeconomic History   Marital status: Widowed    Spouse name: Not  on file   Number of children: Not on file   Years of education: Not on file   Highest education level: Not on file  Occupational History   Occupation: retired    Fish farm manager: RETIRED  Tobacco Use   Smoking status: Former    Types: Cigarettes    Quit date: 1995    Years since quitting: 27.8   Smokeless tobacco: Never  Vaping Use   Vaping Use: Never used  Substance and Sexual Activity   Alcohol use: No   Drug use: Never   Sexual activity: Not on file  Other Topics Concern   Not on file  Social History Narrative   Lives in ALF at [facility].  08/2017 resident of [facility].    01-19-18 Unable to ask abuse questions daughter with her today.   Social Determinants of Health   Financial Resource Strain: Not  on file  Food Insecurity: Not on file  Transportation Needs: Not on file  Physical Activity: Not on file  Stress: Not on file  Social Connections: Not on file  Intimate Partner Violence: Not on file     Review of Systems    General:  No chills, fever, night sweats or weight changes.  Cardiovascular:  No chest pain, dyspnea on exertion, edema, orthopnea, palpitations, paroxysmal nocturnal dyspnea. Dermatological: No rash, lesions/masses Respiratory: No cough, dyspnea Urologic: No hematuria, dysuria Abdominal:   No nausea, vomiting, diarrhea, bright red blood per rectum, melena, or hematemesis Neurologic:  No visual changes, wkns, changes in mental status. All other systems reviewed and are otherwise negative except as noted above.  Physical Exam    VS:  BP (!) 134/58   Pulse 66   Ht 4\' 8"  (1.422 m)   Wt 101 lb 6.4 oz (46 kg)   SpO2 94%   BMI 22.73 kg/m  , BMI Body mass index is 22.73 kg/m. GEN: Well nourished, well developed, in no acute distress. HEENT: normal. Neck: Supple, no JVD, carotid bruits, or masses. Cardiac: RRR, no murmurs, rubs, or gallops. No clubbing, cyanosis, edema.  Radials/DP/PT 2+ and equal bilaterally.  Respiratory:   Respirations regular and unlabored, clear to auscultation bilaterally. GI: Soft, nontender, nondistended, BS + x 4. MS: no deformity or atrophy. Skin: warm and dry, no rash. Neuro:  Strength and sensation are intact. Psych: Normal affect.  Accessory Clinical Findings    Recent Labs: No results found for requested labs within last 8760 hours.   Recent Lipid Panel    Component Value Date/Time   CHOL 168 08/26/2017 0038   TRIG 68 08/26/2017 0038   HDL 68 08/26/2017 0038   CHOLHDL 2.5 08/26/2017 0038   VLDL 14 08/26/2017 0038   LDLCALC 86 08/26/2017 0038    ECG personally reviewed by me today-none today.  EKG 07/31/2020 sinus bradycardia left axis deviation 59 bpm- No acute changes   Echocardiogram 08/26/2017 Study Conclusions   - Left ventricle: The cavity size was normal. There was severe    concentric hypertrophy. Systolic function was vigorous. The    estimated ejection fraction was in the range of 65% to 70%. Wall    motion was normal; there were no regional wall motion    abnormalities. Doppler parameters are consistent with abnormal    left ventricular relaxation (grade 1 diastolic dysfunction). The    E/e&' ratio is between 8-15, suggesting indeterminate LV filling    pressure.  - Aortic valve: Sclerosis without stenosis. There was trivial    regurgitation.  - Mitral valve: Calcified annulus. Mildly thickened leaflets .    There was trivial regurgitation.  - Left atrium: The atrium was normal in size.  - Inferior vena cava: The vessel was normal in size. The    respirophasic diameter changes were in the normal range (>= 50%),    consistent with normal central venous pressure.   Impressions:   - LVEF 65-70%, severe LVH, normal wall motion, grade 1 DD,    indeterminate LV filling pressure, aortic valve sclerosis with    trivial AI, trivial MR, normal LA size, normal IVC.    Cardiac catheterization 08/26/2017 Mid LAD lesion is 30% stenosed. The left ventricular  systolic function is normal. LV end diastolic pressure is normal. The left ventricular ejection fraction is 55-65% by visual estimate.   1. No significant obstructive CAD 2. Normal LV function 3. Normal LVEDP   Plan: medical therapy  Diagnostic Dominance: Right     Intervention      Assessment & Plan   1.  Coronary artery disease -denies chest pain.  Underwent cardiac catheterization on 08/26/2017.  She was noted to have 30% mid LAD stenosis and no other disease.  Medical management was recommended. Continue carvedilol, furosemide Heart healthy low-sodium high-fiber diet-salty 6 given Increase physical activity as tolerated   Lower extremity edema-continues with 1+ pitting bilateral lower extremity  edema.  Dependent edema versus venous stasis.  Not able to to use compression stockings due to arthritis in bilateral hands. Continue furosemide Heart healthy low-sodium diet-salty 6 given Increase physical activity as tolerated Elevate lower extremities when not active May use Ace bandages for light compression Continue fluid restriction 48-64 ounces daily   COPD-stable.  Reports she remains physically active. Continue DuoNeb, prednisone Heart healthy low-sodium diet-salty 6 given Increase physical activity as tolerated Follows with PCP     Disposition: Follow-up with Dr. Debara Pickett in 6-9 months.   Jossie Ng. Juleah Paradise NP-C    02/02/2021, 2:04 PM [address] Office [phone] Fax [phone]  Notice: This dictation was prepared with Dragon dictation along with smaller phrase technology. Any transcriptional errors that result from this process are unintentional and may not be corrected upon review.  I spent 13 minutes examining this patient, reviewing medications, and using patient centered shared decision making involving her cardiac care.  Prior to her visit I spent greater than 20 minutes reviewing her past medical  history,  medications, and prior cardiac tests.

## 2021-02-02 ENCOUNTER — Ambulatory Visit: Payer: Medicare Other | Admitting: General Practice

## 2021-02-02 ENCOUNTER — Other Ambulatory Visit: Payer: Self-pay

## 2021-02-02 ENCOUNTER — Encounter: Payer: Self-pay | Admitting: General Practice

## 2021-02-02 VITALS — BP 134/58 | HR 66 | Ht <= 58 in | Wt 101.4 lb

## 2021-02-02 DIAGNOSIS — I2510 Atherosclerotic heart disease of native coronary artery without angina pectoris: Secondary | ICD-10-CM | POA: Diagnosis not present

## 2021-02-02 DIAGNOSIS — J449 Chronic obstructive pulmonary disease, unspecified: Secondary | ICD-10-CM | POA: Diagnosis not present

## 2021-02-02 DIAGNOSIS — I2584 Coronary atherosclerosis due to calcified coronary lesion: Secondary | ICD-10-CM

## 2021-02-02 DIAGNOSIS — R600 Localized edema: Secondary | ICD-10-CM

## 2021-02-02 NOTE — Patient Instructions (Signed)
Medication Instructions:  No Changes *If you need a refill on your cardiac medications before your next appointment, please call your pharmacy*   Lab Work: No Labs If you have labs (blood work) drawn today and your tests are completely normal, you will receive your results only by: [place] (if you have MyChart) OR A paper copy in the mail If you have any lab test that is abnormal or we need to change your treatment, we will call you to review the results.   Testing/Procedures: No Testing   Follow-Up: At [facility], you and your health needs are our priority.  As part of our continuing mission to provide you with exceptional heart care, we have created designated Provider Care Teams.  These Care Teams include your primary Cardiologist (physician) and Advanced Practice Providers (APPs -  Physician Assistants and Nurse Practitioners) who all work together to provide you with the care you need, when you need it.  We recommend signing up for the patient portal called "MyChart".  Sign up information is provided on this After Visit Summary.  MyChart is used to connect with patients for Virtual Visits (Telemedicine).  Patients are able to view lab/test results, encounter notes, upcoming appointments, etc.  Non-urgent messages can be sent to your provider as well.   To learn more about what you can do with MyChart, go to [email or web address].    Your next appointment:   6-9 month(s)  The format for your next appointment:   In Person  Provider:   K. Mali Hilty, MD   Other Instructions  Increase physical activity as tolerated. Elevated Legs when resting.

## 2021-02-17 ENCOUNTER — Telehealth: Payer: Self-pay | Admitting: *Deleted

## 2021-02-17 NOTE — Telephone Encounter (Signed)
Labs received from:Dr. Mayra Neer  Drawn on:02/11/2021  Reviewed by:Dr. Bo Merino   Labs drawn:CMP, Lipid Panel, CBC with diff  Results:Glucose 126   BUN 28   GFR 50   ALP 33   Cholesterol 250   HDLD 90   NHDL 160   LDL Chol Calc 145   RBC 3.70   Hgb 11.9   Hct 35.6   NE % 87.5   LY % 9.6   MO % 2.5   LY # 0.70   MO # 0.2  Patient is on Prednisone 10 mg daily.

## 2021-02-18 ENCOUNTER — Other Ambulatory Visit: Payer: Self-pay

## 2021-02-18 ENCOUNTER — Ambulatory Visit: Payer: Medicare Other | Admitting: Podiatry

## 2021-02-18 DIAGNOSIS — M79674 Pain in right toe(s): Secondary | ICD-10-CM

## 2021-02-18 DIAGNOSIS — B351 Tinea unguium: Secondary | ICD-10-CM

## 2021-02-18 DIAGNOSIS — I739 Peripheral vascular disease, unspecified: Secondary | ICD-10-CM

## 2021-02-18 DIAGNOSIS — M79675 Pain in left toe(s): Secondary | ICD-10-CM

## 2021-02-18 DIAGNOSIS — L84 Corns and callosities: Secondary | ICD-10-CM

## 2021-02-18 DIAGNOSIS — M0579 Rheumatoid arthritis with rheumatoid factor of multiple sites without organ or systems involvement: Secondary | ICD-10-CM

## 2021-02-19 ENCOUNTER — Encounter: Payer: Self-pay | Admitting: Podiatry

## 2021-02-19 DIAGNOSIS — F3340 Major depressive disorder, recurrent, in remission, unspecified: Secondary | ICD-10-CM | POA: Insufficient documentation

## 2021-02-19 NOTE — Progress Notes (Signed)
  Subjective:  Patient ID: [name], female    DOB: December 26, 1929,  MRN: [id number]  [name] presents to clinic today for painful thick toenails that are difficult to trim. Pain interferes with ambulation. Aggravating factors include wearing enclosed shoe gear. Pain is relieved with periodic professional debridement. and preulcerative callus(es) b/l feet.  Her daughter, [name], is present during today's visit.  Ms. [name] has h/o RA and severe pedal deformities resulting in preulcerative lesions.   PCP is Emily Neer, MD , and last visit was 02/11/2021.  Allergies  Allergen Reactions   Latex Other (See Comments)    Blisters on her mouth   Other Shortness Of Breath    Magic Mouthwash   Darvocet [Propoxyphene N-Acetaminophen] Nausea And Vomiting   Erythromycin Nausea And Vomiting    Other reaction(s): Unknown   Hydrocodone-Acetaminophen     Other reaction(s): dizzy Other reaction(s): dizzy   Ibandronic Acid     Muscle aches Other reaction(s): aches   Morphine And Related Nausea And Vomiting   Morphine Sulfate     Other reaction(s): Unknown   Oxycodone Hcl     Other reaction(s): Unknown   Oxycontin [Oxycodone Hcl] Nausea And Vomiting   Oxycontin [Oxycodone]     Other reaction(s): Unknown   Sulfa Antibiotics Nausea And Vomiting    Other reaction(s): Unknown   Tramadol Nausea And Vomiting   Vicodin [Hydrocodone-Acetaminophen]     dizzy    Review of Systems: Negative except as noted in the HPI. Objective:   Constitutional [name] is a pleasant 87 y.o. Caucasian female, frail, in NAD. AAO x 3.   Vascular CFT <3 seconds b/l LE. Faintly palpable pedal pulses b/l. Faintly palpable PT pulse(s) b/l lower extremities. Pedal hair absent. No pain with calf compression b/l. Lower extremity skin temperature gradient within normal limits. +1 pitting edema noted BLE. Evidence of chronic venous insufficiency b/l lower extremities. No ischemia or gangrene noted b/l lower  extremities. No cyanosis or clubbing noted.  Neurologic Normal speech. Oriented to person, place, and time. Protective sensation intact 5/5 intact bilaterally with 10g monofilament b/l.  Dermatologic Pedal skin thin and atrophic b/l LE. No interdigital macerations noted b/l LE. Toenails 1-5 b/l elongated, discolored, dystrophic, thickened, crumbly with subungual debris and tenderness to dorsal palpation. Preulcerative lesion noted submet head 2 b/l and submet head 3 left foot. There is visible subdermal hemorrhage. There is no surrounding erythema, no edema, no drainage, no odor, no fluctuance.  Orthopedic: Normal muscle strength 5/5 to all lower extremity muscle groups bilaterally. HAV with bunion deformity noted b/l LE. Severe hammertoe deformity noted 2-5 bilaterally.   Radiographs: None Assessment:   1. Pain due to onychomycosis of toenails of both feet   2. Pre-ulcerative calluses   3. Rheumatoid arthritis involving multiple sites with positive rheumatoid factor ([place])   4. Peripheral vascular disease ([place])    Plan:  Patient was evaluated and treated and all questions answered. Consent given for treatment as described below: -Examined patient. -Toenails 1-5 b/l were debrided in length and girth with sterile nail nippers and dremel without iatrogenic bleeding.  -Preulcerative lesion pared submet head 2 b/l and submet head 3 left foot. Total number pared=3. May continue Iodosorb Gel as needed to lesion to prevent infection. -Patient/POA to call should there be question/concern in the interim.  Return in about 9 weeks (around 04/22/2021).  Marzetta Board, DPM

## 2021-02-26 ENCOUNTER — Encounter (INDEPENDENT_AMBULATORY_CARE_PROVIDER_SITE_OTHER): Payer: Medicare Other | Admitting: Ophthalmology

## 2021-03-02 ENCOUNTER — Ambulatory Visit: Payer: Medicare Other | Admitting: Podiatry

## 2021-03-03 ENCOUNTER — Ambulatory Visit: Payer: Medicare Other | Admitting: Podiatry

## 2021-03-14 ENCOUNTER — Emergency Department (HOSPITAL_COMMUNITY): Payer: Medicare Other

## 2021-03-14 ENCOUNTER — Emergency Department (HOSPITAL_COMMUNITY)
Admission: EM | Admit: 2021-03-14 | Discharge: 2021-03-14 | Disposition: A | Discharge status: Home / Self Care | Payer: Medicare Other | Attending: Emergency Medicine | Admitting: Emergency Medicine

## 2021-03-14 ENCOUNTER — Encounter (HOSPITAL_COMMUNITY): Payer: Self-pay

## 2021-03-14 ENCOUNTER — Other Ambulatory Visit: Payer: Self-pay

## 2021-03-14 DIAGNOSIS — Z85118 Personal history of other malignant neoplasm of bronchus and lung: Secondary | ICD-10-CM | POA: Insufficient documentation

## 2021-03-14 DIAGNOSIS — R109 Unspecified abdominal pain: Secondary | ICD-10-CM | POA: Insufficient documentation

## 2021-03-14 DIAGNOSIS — J449 Chronic obstructive pulmonary disease, unspecified: Secondary | ICD-10-CM | POA: Insufficient documentation

## 2021-03-14 DIAGNOSIS — Z87891 Personal history of nicotine dependence: Secondary | ICD-10-CM | POA: Insufficient documentation

## 2021-03-14 DIAGNOSIS — M549 Dorsalgia, unspecified: Secondary | ICD-10-CM

## 2021-03-14 DIAGNOSIS — M545 Low back pain, unspecified: Secondary | ICD-10-CM | POA: Diagnosis present

## 2021-03-14 DIAGNOSIS — Z91040 Latex allergy status: Secondary | ICD-10-CM | POA: Diagnosis not present

## 2021-03-14 LAB — CBC WITH DIFFERENTIAL/PLATELET
Abs Immature Granulocytes: 0.04 10*3/uL (ref 0.00–0.07)
Basophils Absolute: 0 10*3/uL (ref 0.0–0.1)
Basophils Relative: 0 %
Eosinophils Absolute: 0 10*3/uL (ref 0.0–0.5)
Eosinophils Relative: 0 %
HCT: 40 % (ref 36.0–46.0)
Hemoglobin: 12.8 g/dL (ref 12.0–15.0)
Immature Granulocytes: 1 %
Lymphocytes Relative: 13 %
Lymphs Abs: 1.1 10*3/uL (ref 0.7–4.0)
MCH: 32.3 pg (ref 26.0–34.0)
MCHC: 32 g/dL (ref 30.0–36.0)
MCV: 101 fL — ABNORMAL HIGH (ref 80.0–100.0)
Monocytes Absolute: 0.5 10*3/uL (ref 0.1–1.0)
Monocytes Relative: 6 %
Neutro Abs: 6.7 10*3/uL (ref 1.7–7.7)
Neutrophils Relative %: 80 %
Platelets: 209 10*3/uL (ref 150–400)
RBC: 3.96 MIL/uL (ref 3.87–5.11)
RDW: 14.2 % (ref 11.5–15.5)
WBC: 8.4 10*3/uL (ref 4.0–10.5)
nRBC: 0 % (ref 0.0–0.2)

## 2021-03-14 LAB — COMPREHENSIVE METABOLIC PANEL
ALT: 14 U/L (ref 0–44)
AST: 39 U/L (ref 15–41)
Albumin: 4.2 g/dL (ref 3.5–5.0)
Alkaline Phosphatase: 35 U/L — ABNORMAL LOW (ref 38–126)
Anion gap: 8 (ref 5–15)
BUN: 28 mg/dL — ABNORMAL HIGH (ref 8–23)
CO2: 30 mmol/L (ref 22–32)
Calcium: 9 mg/dL (ref 8.9–10.3)
Chloride: 99 mmol/L (ref 98–111)
Creatinine, Ser: 1.11 mg/dL — ABNORMAL HIGH (ref 0.44–1.00)
GFR, Estimated: 48 mL/min — ABNORMAL LOW (ref >60)
Glucose, Bld: 113 mg/dL — ABNORMAL HIGH (ref 70–99)
Potassium: 4.9 mmol/L (ref 3.5–5.1)
Sodium: 137 mmol/L (ref 135–145)
Total Bilirubin: 1.3 mg/dL — ABNORMAL HIGH (ref 0.3–1.2)
Total Protein: 7.6 g/dL (ref 6.5–8.1)

## 2021-03-14 LAB — LIPASE, BLOOD: Lipase: 39 U/L (ref 11–51)

## 2021-03-14 MED ORDER — SENNOSIDES-DOCUSATE SODIUM 8.6-50 MG PO TABS: 1.0000 | ORAL_TABLET | Freq: Every evening | ORAL | 0 refills | Status: DC | PRN

## 2021-03-14 MED ORDER — OXYCODONE-ACETAMINOPHEN 5-325 MG PO TABS: 1.0000 | ORAL_TABLET | Freq: Four times a day (QID) | ORAL | 0 refills | Status: DC | PRN

## 2021-03-14 MED ORDER — ONDANSETRON HCL 4 MG PO TABS: 4.0000 mg | ORAL_TABLET | Freq: Four times a day (QID) | ORAL | 0 refills | Status: DC

## 2021-03-14 MED ORDER — FENTANYL CITRATE PF 50 MCG/ML IJ SOSY
25.0000 ug | PREFILLED_SYRINGE | Freq: Once | INTRAMUSCULAR | Status: AC
  Administered 2021-03-14: 25 ug via INTRAVENOUS
  Filled 2021-03-14: qty 1

## 2021-03-14 MED ORDER — OXYCODONE-ACETAMINOPHEN 5-325 MG PO TABS
1.0000 | ORAL_TABLET | Freq: Once | ORAL | Status: AC
  Administered 2021-03-14: 1 via ORAL
  Filled 2021-03-14: qty 1

## 2021-03-14 MED ORDER — ONDANSETRON HCL 4 MG/2ML IJ SOLN
4.0000 mg | Freq: Once | INTRAMUSCULAR | Status: AC
  Administered 2021-03-14: 4 mg via INTRAVENOUS
  Filled 2021-03-14: qty 2

## 2021-03-14 MED ORDER — IOHEXOL 350 MG/ML SOLN
80.0000 mL | Freq: Once | INTRAVENOUS | Status: AC | PRN
  Administered 2021-03-14: 80 mL via INTRAVENOUS

## 2021-03-14 MED ORDER — DICLOFENAC SODIUM 1 % EX GEL: 2.0000 g | Freq: Four times a day (QID) | CUTANEOUS | 0 refills | Status: DC

## 2021-03-14 NOTE — ED Provider Notes (Signed)
Emergency Department Provider Note   I have reviewed the triage vital signs and the nursing notes.   HISTORY  Chief Complaint No chief complaint on file.   HPI [name] is a 87 y.o. female with PMH reviewed below presents emergency department for evaluation of left lower back pain.  The arrival complaint is listed as "chronic back pain" from the triage process but the patient tells me that this pain began around 2 weeks ago.  Patient's daughter, at bedside, confirms that she is not aware of her having this pain in the past.  Patient points to the superior left buttock without radiation down the leg.  No numbness or weakness.  Pain is moderate to severe and worse with movement.  The patient and family deny any falls.  The patient ambulates with a walker at baseline and lives alone.  Her primary care doctor is Dr. Brigitte Pulse and apparently called in a prescription for tramadol.  Patient took the medication yesterday but felt like it did not help her.  With continued pain, the family called EMS for transport to the emergency department. Patient denies any abdominal pain, nausea, vomiting, or CP.    Past Medical History:  Diagnosis Date   Cancer ([place])    lung ca   COPD (chronic obstructive pulmonary disease) (HCC)    Hernia, umbilical    Hip pain, right    History of radiation therapy 05/02/12-05/04/12,&05/09/12   rul lung 54Gy/57fx   HLD (hyperlipidemia)    Lung mass    Myocardial infarction ([place]) 08/2017   Osteoporosis    PAC (premature atrial contraction)    RA (rheumatoid arthritis) ([place])    Shingles     Patient Active Problem List   Diagnosis Date Noted   Recurrent major depression in remission ([place]) 02/19/2021   Acute non-ST segment elevation myocardial infarction ([place]) 04/01/2020   Ataxia 04/01/2020   Frail elderly 04/01/2020   Heart disease 04/01/2020   Hypercholesterolemia 04/01/2020   Insomnia 04/01/2020   Ovarian mass 04/01/2020   Malignant neoplasm of lung ([place])  04/01/2020   Early stage nonexudative age-related macular degeneration of both eyes 02/26/2020   Posterior vitreous detachment of both eyes 02/26/2020   Abnormal CT of the chest 09/01/2017   Adult failure to thrive 09/01/2017   Neurocognitive disorder 08/30/2017   NSTEMI (non-ST elevated myocardial infarction) ([place]) 08/25/2017   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 Secondary to either irritation by dentures, vs. secondary to lung neoplasm 12/23/2012   Rheumatoid arthritis-severe 12/23/2012   COPD, mild ([place]) 12/23/2012   Osteoporosis 12/23/2012   Primary cancer of right upper lobe of lung ([place]) 03/21/2012    Past Surgical History:  Procedure Laterality Date   LEFT HEART CATH AND CORONARY ANGIOGRAPHY N/A 08/26/2017   Procedure: LEFT HEART CATH AND CORONARY ANGIOGRAPHY;  Surgeon: Martinique, Peter M, MD;  Location: [facility];  Service: Cardiovascular;  Laterality: N/A;   right lobectomy      Allergies Latex, Other, Darvocet [propoxyphene n-acetaminophen], Erythromycin, Hydrocodone-acetaminophen, Ibandronic  acid, Morphine and related, Morphine sulfate, Oxycontin [oxycodone hcl], Sulfa antibiotics, and Tramadol  Family History  Problem Relation Age of Onset   Cancer Daughter    Cancer Mother     Social History Social History   Tobacco Use   Smoking status: Former    Types: Cigarettes    Quit date: 1995    Years since quitting:  27.9   Smokeless tobacco: Never  Vaping Use   Vaping Use: Never used  Substance Use Topics   Alcohol use: No   Drug use: Never    Review of Systems  Constitutional: No fever/chills Eyes: No visual changes. ENT: No sore throat. Cardiovascular: Denies chest pain. Respiratory: Denies shortness of breath. Gastrointestinal: No abdominal pain.  No nausea, no vomiting.  No diarrhea.  No constipation. Genitourinary: Negative for dysuria. Musculoskeletal: Positive for back pain. Skin: Negative for rash. Neurological: Negative for headaches, focal weakness or numbness.  10-point ROS otherwise negative.  ____________________________________________   PHYSICAL EXAM:  VITAL SIGNS: ED Triage Vitals  Enc Vitals Group     BP 03/14/21 1347 (!) 193/61     Pulse Rate 03/14/21 1347 61     Resp 03/14/21 1347 20     Temp 03/14/21 1347 98.4 F (36.9 C)     Temp Source 03/14/21 1347 Oral     SpO2 03/14/21 1347 94 %   Constitutional: Alert and oriented. Well appearing and in no acute distress. Eyes: Conjunctivae are normal.  Head: Atraumatic. Nose: No congestion/rhinnorhea. Mouth/Throat: Mucous membranes are moist.  Neck: No stridor.  Cardiovascular: Normal rate, regular rhythm. Good peripheral circulation. Grossly normal heart sounds.   Respiratory: Normal respiratory effort.  No retractions. Lungs CTAB. Gastrointestinal: Soft and nontender. No distention.  Musculoskeletal: No lower extremity tenderness nor edema. No gross deformities of extremities. Tenderness to palpation over the superior left buttock. Pain with ROM of the left hip. Normal ROM of the right hip.  Neurologic:  Normal speech and language. No gross focal neurologic deficits are appreciated. Normal sensation in the bilateral LEs. 2+ patellar reflexes bilaterally.  Skin:  Skin is warm, dry and intact. No rash noted.  ____________________________________________   LABS (all labs ordered are listed, but only abnormal  results are displayed)  Labs Reviewed  COMPREHENSIVE METABOLIC PANEL - Abnormal; Notable for the following components:      Result Value   Glucose, Bld 113 (*)    BUN 28 (*)    Creatinine, Ser 1.11 (*)    Alkaline Phosphatase 35 (*)    Total Bilirubin 1.3 (*)    GFR, Estimated 48 (*)    All other components within normal limits  CBC WITH DIFFERENTIAL/PLATELET - Abnormal; Notable for the following components:   MCV 101.0 (*)    All other components within normal limits  URINE CULTURE  LIPASE, BLOOD  URINALYSIS, ROUTINE W REFLEX MICROSCOPIC   ____________________________________________  RADIOLOGY  CT ABDOMEN PELVIS W CONTRAST  Result Date: 03/14/2021 CLINICAL DATA:  Abdominal pain. EXAM: CT ABDOMEN AND PELVIS WITH CONTRAST TECHNIQUE: Multidetector CT imaging of the abdomen and pelvis was performed using the standard protocol following bolus administration of intravenous contrast. CONTRAST:  61mL OMNIPAQUE IOHEXOL 350 MG/ML SOLN COMPARISON:  December 06, 2003 FINDINGS: Lower chest: A 5 mm calcified granuloma is seen within the posterior aspect of the left lung base. Hepatobiliary: A 4 mm focus of parenchymal low attenuation is seen within the posterior aspect of the right lobe of the liver. This is too small to characterize by CT  examination. No gallstones, gallbladder wall thickening, or biliary dilatation. Pancreas: Unremarkable. No pancreatic ductal dilatation or surrounding inflammatory changes. Spleen: Punctate calcified granulomas are seen scattered throughout the spleen. Adrenals/Urinary Tract: Adrenal glands are unremarkable. Kidneys are normal in size, without renal calculi. Prominent bilateral extrarenal pelvis are seen with mild left-sided hydroureter. Multiple subcentimeter simple cysts are noted within both kidneys. Bladder is unremarkable. Stomach/Bowel: Stomach is within normal limits. Appendix appears normal. No evidence of bowel wall thickening, distention, or inflammatory  changes. Noninflamed diverticula are seen within the sigmoid colon. Vascular/Lymphatic: Aortic atherosclerosis. No enlarged abdominal or pelvic lymph nodes. Reproductive: Curvilinear calcifications are seen throughout the uterine parenchyma. This is increased in severity when compared to the prior study. The bilateral adnexa are unremarkable. Other: No abdominal wall hernia or abnormality. Multiple surgical coils are seen along the anterior pelvic wall. No abdominopelvic ascites. Musculoskeletal: There is marked severity dextroscoliosis of the lumbar spine with marked severity multilevel degenerative changes. IMPRESSION: 1. Sigmoid diverticulosis. 2. Marked severity dextroscoliosis of the lumbar spine with marked severity multilevel degenerative changes. 3. Small bilateral simple renal cysts. 4. Aortic atherosclerosis. Aortic Atherosclerosis (ICD10-I70.0). Electronically Signed   By: Virgina Norfolk M.D.   On: 03/14/2021 18:33   CT L-SPINE NO CHARGE  Result Date: 03/14/2021 CLINICAL DATA:  Back pain. EXAM: CT LUMBAR SPINE WITHOUT CONTRAST TECHNIQUE: Multidetector CT imaging of the lumbar spine was performed without intravenous contrast administration. Multiplanar CT image reconstructions were also generated. COMPARISON:  Concurrently performed CT abdomen/pelvis. FINDINGS: Segmentation: For the purposes of this examination, the lowest well-formed if you will disc space is designated L5-S1. Ribs are absent at the T12 level. Alignment: Marked lumbar dextrocurvature centered at L2-L3. 4 mm L5-S1 grade 1 anterolisthesis. Vertebrae: Vertebral body height is maintained. No evidence of acute fracture to the lumbar spine. Paraspinal and other soft tissues: Please concurrently performed CT abdomen/pelvis for description of abdominopelvic soft tissue findings. Atrophy of the lumbar paraspinal musculature. Disc levels: Multilevel disc space narrowing. Most notably, advanced disc space narrowing is present on ball left at  T12-L1, L1-L2, L2-L3 and L3-L4, as well as on the right at L4-L5 and L5-S1. T12-L1: Disc bulge. Superimposed partially calcified right center/subarticular disc extrusion. Facet arthrosis. The disc extrusion results in right subarticular stenosis. Mild to moderate central canal narrowing. Mild right neural foraminal narrowing. L1-L2: Disc bulge asymmetric to the left. Endplate spurring/osteophytic ridging along the left aspect of the disc space. Facet arthrosis. No appreciable significant spinal canal stenosis or neural foraminal narrowing. L2-L3: Disc bulge. Associated endplate spurring/osteophytic ridging, greatest along the left aspect of the disc space. Facet arthrosis. No appreciable significant spinal canal stenosis. Mild/moderate left neural foraminal narrowing. L3-L4: Disc bulge asymmetric to the left. Endplate spurring/osteophytic ridging along the left aspect of the disc space. Facet arthrosis/ligamentum flavum hypertrophy. Bilateral subarticular stenosis. Suspected moderate central canal stenosis. Mild left neural foraminal narrowing. L4-L5: Post laminotomy changes are questioned on the right. Disc bulge. Endplate spurring/osteophytic ridging greatest along the right aspect of the disc space. Advanced facet arthrosis with ligamentum flavum hypertrophy. Right subarticular stenosis. Suspected moderate central canal stenosis. Bilateral neural foraminal narrowing (moderate/severe right, mild/moderate left). L5-S1: Suspected prior left laminotomy. Grade 1 anterolisthesis. Disc uncovering with disc bulge. Endplate spurring/osteophytic ridging along the right aspect of the disc space. Advanced facet arthrosis. No appreciable significant spinal canal stenosis. Severe right neural foraminal narrowing. IMPRESSION: For the purposes of this dictation, 5 lumbar vertebrae are assumed and the caudal most well-formed intervertebral disc space is designated L5-S1. Ribs  are absent at the T12 level. Marked lumbar  dextrocurvature centered at L2-L3. Lumbar spondylosis and postoperative changes, as outlined. Multilevel spinal canal stenosis. Most notably, there is suspected mild to moderate central canal narrowing at T12-L1, suspected moderate central canal narrowing at L3-L4 and suspected moderate central canal narrowing at L4-L5. Additionally, subarticular stenosis is present on the right at T12-L1, bilaterally at L3-L4 and on the right at L4-L5. Multilevel foraminal stenosis, as detailed and greatest on the right at L4-L5 (moderate/severe) and on the right at L5-S1 (severe). Diffuse disc degeneration throughout the lumbar and visualized lower thoracic spine with multiple levels of severe disc space narrowing, as detailed. 4 mm L5-S1 grade 1 anterolisthesis. Please refer to the concurrently performed CT abdomen/pelvis for description of abdominopelvic soft tissue findings. Electronically Signed   By: Kellie Simmering D.O.   On: 03/14/2021 18:54   DG Hip Unilat W or Wo Pelvis 2-3 Views Left  Result Date: 03/14/2021 CLINICAL DATA:  Low back and hip pain. EXAM: DG HIP (WITH OR WITHOUT PELVIS) 2-3V LEFT COMPARISON:  PET CT 09/10/2015 FINDINGS: There is no evidence of hip fracture or dislocation. There is no evidence of arthropathy or other focal bone abnormality. Calcified uterus measuring 8.7 x 8.3 cm is again noted. Surgical coils overlie the abdomen. Degenerative changes affect the lumbar spine. IMPRESSION: No acute fracture or dislocation of the left hip. Electronically Signed   By: Ronney Asters M.D.   On: 03/14/2021 18:24    ____________________________________________   PROCEDURES  Procedure(s) performed:   Procedures  None  ____________________________________________   INITIAL IMPRESSION / ASSESSMENT AND PLAN / ED COURSE  Pertinent labs & imaging results that were available during my care of the patient were reviewed by me and considered in my medical decision making (see chart for details).   The  patient presents emergency department with left hip/lower back pain.  Initially described as "chronic" in the triage note but in discussion with the patient and sister at bedside this appears to have started at most 2 weeks ago.  No fall associated to suspect fracture.  Doubt dislocation on exam.  Patient has no acute neurologic findings to strongly suspect acute cord compression, cauda equina, or epidural hematoma.  Patient has no prior spine imaging in our system.  I do not see frequent visits for low back pain or chronic back pain.  Patient's blood pressure is very high on arrival likely related to her level of discomfort.  Plan for fentanyl along with Zofran and will obtain plain films of the left hip along with CT imaging of the abdomen and lower back.  Not convinced the patient requires emergent MRI at this time given her exam findings.  Symptomatology seems most consistent with sciatica on my initial evaluation but will continue to follow labs and reassess patient after treatment.   Differential diagnosis includes but is not exclusive to musculoskeletal back pain, renal colic, urinary tract infection, pyelonephritis, intra-abdominal causes of back pain, aortic aneurysm or dissection, cauda equina syndrome, sciatica, lumbar disc disease, thoracic disc disease, etc.   Plain films and CT imaging reviewed.  Patient has scoliosis with multilevel degenerative disease and canal stenosis.  Her exam here is improved after pain medication.  She is able to sit up in bed and dangle her legs over the edge.  She is able to stand.  Do not see an indication for MRI of the lumbar spine.  Her neurologic exam of the lower extremities is within normal limits.  We  discussed with the case management team and they have arranged for home health services to evaluate the patient. Patient has nausea/vomiting reaction with Percocet. Will d/c with zofran along with constipation meds. Patient and daughter cautioned to not take  Percocet with Tramadol. She will f/u with her PCP and home health. Labs are reassuring.  ____________________________________________  FINAL CLINICAL IMPRESSION(S) / ED DIAGNOSES  Final diagnoses:  Back pain     MEDICATIONS GIVEN DURING THIS VISIT:  Medications  fentaNYL (SUBLIMAZE) injection 25 mcg (25 mcg Intravenous Given 03/14/21 1653)  ondansetron (ZOFRAN) injection 4 mg (4 mg Intravenous Given 03/14/21 1653)  iohexol (OMNIPAQUE) 350 MG/ML injection 80 mL (80 mLs Intravenous Contrast Given 03/14/21 1758)  oxyCODONE-acetaminophen (PERCOCET/ROXICET) 5-325 MG per tablet 1 tablet (1 tablet Oral Given 03/14/21 1900)     NEW OUTPATIENT MEDICATIONS STARTED DURING THIS VISIT:  New Prescriptions   DICLOFENAC SODIUM (VOLTAREN) 1 % GEL    Apply 2 g topically 4 (four) times daily.   ONDANSETRON (ZOFRAN) 4 MG TABLET    Take 1 tablet (4 mg total) by mouth every 6 (six) hours.   OXYCODONE-ACETAMINOPHEN (PERCOCET/ROXICET) 5-325 MG TABLET    Take 1 tablet by mouth every 6 (six) hours as needed for severe pain.   SENNA-DOCUSATE (SENOKOT-S) 8.6-50 MG TABLET    Take 1 tablet by mouth at bedtime as needed for mild constipation or moderate constipation.    Note:  This document was prepared using Dragon voice recognition software and may include unintentional dictation errors.  Nanda Quinton, MD, [facility] Emergency Medicine    [name], Wonda Olds, MD 03/14/21 [phone]

## 2021-03-14 NOTE — ED Triage Notes (Addendum)
Patient BIB [facility], patient c/o chronic lower back pain. Patient prescribed tramadol on Tuesday, took first dose on Wednesday. Patient HOH. Patient reported to EMS tramadol 50mg  is not working for pain. EMS states patients daughter requested patient be taken to hospital because daughter is not going to be able to continue to go to patients house.   Daughter is Shirleen Schirmer [phone]

## 2021-03-14 NOTE — Discharge Instructions (Signed)
You have been seen in the Emergency Department (ED)  today for back pain.  Your workup and exam have not shown any acute abnormalities and you are likely suffering from muscle strain or possible problems with your discs, but there is no treatment that will fix your symptoms at this time.    I am prescribing your Percocet for pain. You should STOP taking the Tramadol since taking these medications together can be dangerous.   Please follow up with your doctor as soon as possible regarding today's ED visit and your back pain.  Return to the ED for worsening back pain, fever, weakness or numbness of either leg, or if you develop either (1) an inability to urinate or have bowel movements, or (2) loss of your ability to control your bathroom functions (if you start having "accidents"), or if you develop other new symptoms that concern you.

## 2021-03-14 NOTE — TOC Initial Note (Addendum)
Transition of Care [facility]) - Initial/Assessment Note    Patient Details  Name: [name] MRN: [id number] Date of Birth: 1930-02-09  Transition of Care [facility]) CM/SW Contact:    Verdell Carmine, RN Phone Number: 03/14/2021, 5:09 PM  Clinical Narrative:                 87 year old patient lives by self. ,presented to he ED with chronic low back pain, not getting relief from medicines prescribed. RN documented that the EMS squad was told by daughter that she cannot keep checking up on the patient, and will not be picking her up. Patient is . Hard of hearing but alert and oriented. Discussed with provider and Nursing. Testing has begun, call to daughter [name] for discharge planning, left confidential voice message to call this RNCM back.  1715 spoke to ms. [name] via phone, she was at the patients bedside. She appreciated the call and is looking for home health, reached out to [facility] for acceptance. . Daughters prime interest is aide to help her bathe. She expressed appreciation . Patient has all DME walker, wheelchair, BSC. Will follow for needs,  [facility] accepted for [facility]  Expected Discharge Plan: [place] Barriers to Discharge: Continued Medical Work up   Patient Goals and CMS Choice        Expected Discharge Plan and Services Expected Discharge Plan: [place]   Discharge Planning Services: CM Consult   Living arrangements for the past 2 months: Single Family Home                           HH Arranged: PT, OT, Nurse's Aide   Date HH Agency Contacted: 03/14/21 Time HH Agency Contacted: 1705 Representative spoke with at [place]  Prior Living Arrangements/Services Living arrangements for the past 2 months: [place] with:: Self Patient language and need for interpreter reviewed:: Yes        Need for Family Participation in Patient Care: Yes (Comment) Care giver support system in place?:  (daughter  stated to EMS that she cant keep checking on pt)   Criminal Activity/Legal Involvement Pertinent to Current Situation/Hospitalization: No - Comment as needed  Activities of Daily Living      Permission Sought/Granted                  Emotional Assessment       Orientation: : Oriented to Self, Oriented to Place Alcohol / Substance Use: Not Applicable Psych Involvement: No (comment)  Admission diagnosis:  Chronic Back Pain  Patient Active Problem List   Diagnosis Date Noted   Recurrent major depression in remission ([place]) 02/19/2021   Acute non-ST segment elevation myocardial infarction ([place]) 04/01/2020   Ataxia 04/01/2020   Frail elderly 04/01/2020   Heart disease 04/01/2020   Hypercholesterolemia 04/01/2020   Insomnia 04/01/2020   Ovarian mass 04/01/2020   Malignant neoplasm of lung ([place]) 04/01/2020   Early stage nonexudative age-related macular degeneration of both eyes 02/26/2020   Posterior vitreous detachment of both eyes 02/26/2020   Abnormal CT of the chest 09/01/2017   Adult failure to thrive 09/01/2017   Neurocognitive disorder 08/30/2017   NSTEMI (non-ST elevated myocardial infarction) ([place]) 08/25/2017   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 Secondary to either irritation by dentures, vs. secondary to lung neoplasm 12/23/2012   Rheumatoid arthritis-severe 12/23/2012   COPD, mild ([place]) 12/23/2012   Osteoporosis  12/23/2012   Primary cancer of right upper lobe of lung ([place]) 03/21/2012   PCP:  Mayra Neer, MD Pharmacy:   [place], [place] - [address] [place] [address] Phone: [phone] Fax: [place], [place] [address] Phone: [phone] Fax: [phone]     Social Determinants of Health (SDOH) Interventions    Readmission Risk Interventions No flowsheet data found.

## 2021-04-07 ENCOUNTER — Other Ambulatory Visit: Payer: Self-pay

## 2021-04-07 ENCOUNTER — Ambulatory Visit: Payer: Medicare Other | Admitting: Podiatry

## 2021-04-07 MED ORDER — PREDNISONE 10 MG PO TABS: 10.0000 mg | ORAL_TABLET | Freq: Every day | ORAL | 0 refills | Status: DC

## 2021-04-07 NOTE — Telephone Encounter (Signed)
Patient called requesting prescription refill of Prednisone 10 mg tablets (90 day supply) to be sent to [facility] at [place].

## 2021-04-07 NOTE — Telephone Encounter (Signed)
Next Visit: 07/28/2021  Last Visit: 01/27/2021  Last Fill: 01/21/2021   Dx: Rheumatoid arthritis of multiple sites with negative rheumatoid factor  Current Dose per office note on 01/27/2021: Prednisone 10 mg by mouth daily  Okay to refill Prednisone?

## 2021-04-21 ENCOUNTER — Other Ambulatory Visit: Payer: Self-pay

## 2021-04-21 ENCOUNTER — Ambulatory Visit: Payer: Medicare Other | Admitting: Podiatry

## 2021-04-21 ENCOUNTER — Encounter: Payer: Self-pay | Admitting: Podiatry

## 2021-04-21 DIAGNOSIS — B351 Tinea unguium: Secondary | ICD-10-CM

## 2021-04-21 DIAGNOSIS — M05772 Rheumatoid arthritis with rheumatoid factor of left ankle and foot without organ or systems involvement: Secondary | ICD-10-CM | POA: Diagnosis not present

## 2021-04-21 DIAGNOSIS — L97522 Non-pressure chronic ulcer of other part of left foot with fat layer exposed: Secondary | ICD-10-CM | POA: Diagnosis not present

## 2021-04-21 DIAGNOSIS — M79675 Pain in left toe(s): Secondary | ICD-10-CM

## 2021-04-21 DIAGNOSIS — M79674 Pain in right toe(s): Secondary | ICD-10-CM

## 2021-04-21 DIAGNOSIS — B070 Plantar wart: Secondary | ICD-10-CM | POA: Insufficient documentation

## 2021-04-21 DIAGNOSIS — M05771 Rheumatoid arthritis with rheumatoid factor of right ankle and foot without organ or systems involvement: Secondary | ICD-10-CM | POA: Diagnosis not present

## 2021-04-21 DIAGNOSIS — M4800 Spinal stenosis, site unspecified: Secondary | ICD-10-CM | POA: Insufficient documentation

## 2021-04-21 MED ORDER — MUPIROCIN 2 % EX OINT: 1.0000 "application " | TOPICAL_OINTMENT | Freq: Every day | CUTANEOUS | 2 refills | Status: DC

## 2021-04-21 NOTE — Patient Instructions (Signed)
Follow up with Dr. Amalia Hailey for ulcer plantar left foot.   [facility] will be performing your dressing changes.

## 2021-04-23 NOTE — Progress Notes (Signed)
Subjective: [name] is a 88 y.o. female patient seen today for follow up of  painful thick toenails that are difficult to trim. Pain interferes with ambulation. Aggravating factors include wearing enclosed shoe gear. Pain is relieved with periodic professional debridement.  Daughter, [name], is present during today's visit. [name] will be having knee surgery on January 17th.  New problems reported today: Patient notes left foot plantar lesion is painful today. Daughter states patient has had a band-aid on her foot since her last visit. Patient wears slippers in the home. She denies any redness or swelling.  PCP is Mayra Neer, MD. Last visit was: 03/10/2021.  Allergies  Allergen Reactions   Latex Other (See Comments)    Blisters on her mouth   Other Shortness Of Breath    Magic Mouthwash   Darvocet [Propoxyphene N-Acetaminophen] Nausea And Vomiting   Erythromycin Nausea And Vomiting    Other reaction(s): Unknown   Hydrocodone-Acetaminophen     Other reaction(s): dizzy  Other reaction(s): dizzy   Ibandronic Acid     Muscle aches Other reaction(s): aches   Morphine And Related Nausea And Vomiting   Morphine Sulfate     Other reaction(s): Unknown   Mouthwashes     Other reaction(s): sob   Oxycontin [Oxycodone Hcl] Nausea And Vomiting   Sulfa Antibiotics Nausea And Vomiting    Other reaction(s): Unknown   Tramadol Nausea And Vomiting    Objective: Physical Exam  General: Patient is a pleasant 88 y.o. Caucasian female thin build in NAD. AAO x 3.   Neurovascular Examination: CFT <3 seconds b/l LE. Palpable pedal pulses b/l LE. Pedal hair absent. No pain with calf compression b/l. +1 pitting edema noted BLE. No ischemia or gangrene noted b/l LE. No cyanosis or clubbing noted b/l LE.  Protective sensation intact 5/5 intact bilaterally with 10g monofilament b/l. Vibratory sensation intact b/l.  Dermatological:  Pedal skin thin, shiny and atrophic b/l LE. No interdigital  macerations noted b/l LE. Toenails 1-5 b/l elongated, discolored, dystrophic, thickened, crumbly with subungual debris and tenderness to dorsal palpation. Porokeratotic lesion(s) submet head 2 right foot. No erythema, no edema, no drainage, no fluctuance. Predebridement:   Musculoskeletal:  Muscle strength 5/5 to all lower extremity muscle groups bilaterally. HAV with bunion deformity noted b/l LE. Severe hammertoe deformity noted 2-5 bilaterally. Utilizes wheelchair for mobility assistance.  Assessment: 1. Pain due to onychomycosis of toenails of both feet   2. Ulcer of left foot with fat layer exposed ([place])   3. Rheumatoid arthritis involving both feet with positive rheumatoid factor (HCC)    Plan: Patient was evaluated and treated in conjunction with Dr. Sherryle Lis.Please see his notes on ulcer description and orders implemented. Consent given for treatment as described below: -Toenails 1-5 b/l were debrided in length and girth with sterile nail nippers and dremel without iatrogenic bleeding.  -Patient/POA to call should there be question/concern in the interim. -She will follow up with Dr. Daylene Katayama on 05/04/2021, for follow up left foot ulcer.   Return in about 3 months (around 07/20/2021).  Marzetta Board, DPM

## 2021-04-27 NOTE — Progress Notes (Signed)
°  Subjective:  Patient ID: [name], female    DOB: 03/21/1930,  MRN: [id number]  Chief Complaint  Patient presents with   Nail Problem    88 y.o. female presents with the above complaint. History confirmed with patient.  She is in the office today with Dr. Elisha Ponder and I am seeing her concurrently for a left foot ulceration that is recurrent.  She is well-known to our practice and myself for this.  Objective:  Physical Exam: warm, good capillary refill, no trophic changes or ulcerative lesions, normal DP and PT pulses, and normal sensory exam. Left Foot: Full-thickness ulceration submetatarsal 2 of the left foot measuring 0.6 x 0.7 x 0.2 cm with exposed subcutaneous tissue, no purulence malodor or signs of infection  Assessment:   1. Pain due to onychomycosis of toenails of both feet   2. Ulcer of left foot with fat layer exposed ([place])   3. Rheumatoid arthritis involving both feet with positive rheumatoid factor ([place])      Plan:  Patient was evaluated and treated and all questions answered.  Ulcer left foot -We discussed the etiology and factors that are a part of the wound healing process.  We also discussed the risk of infection both soft tissue and osteomyelitis from open ulceration.  Discussed the risk of limb loss if this happens or worsens. -Debridement as below. -Dressed with mupirocin, silicone foam border dressing. -Continue home dressing changes daily with silicone foam border dressing and mupirocin -Continue off-loading with surgical shoe. -No current indication for antibiotics at this time as not acutely infected  Procedure: Excisional Debridement of Wound Rationale: Removal of non-viable soft tissue from the wound to promote healing.  Anesthesia: none Post-Debridement Wound Measurements: 0.6 x 0.7 x 0.2 cm  Type of Debridement: Sharp Excisional Tissue Removed: Non-viable soft tissue Depth of Debridement: subcutaneous tissue. Technique: Sharp excisional  debridement to bleeding, viable wound base.  Dressing: Dry, sterile, compression dressing. Disposition: Patient tolerated procedure well.    She will return in a few weeks to see one of our partners when I and out of town       Return in about 3 months (around 07/20/2021).

## 2021-05-04 ENCOUNTER — Ambulatory Visit: Payer: Medicare Other | Admitting: Podiatry

## 2021-05-04 ENCOUNTER — Other Ambulatory Visit: Payer: Self-pay

## 2021-05-04 DIAGNOSIS — L989 Disorder of the skin and subcutaneous tissue, unspecified: Secondary | ICD-10-CM

## 2021-05-04 NOTE — Progress Notes (Signed)
° °  HPI: 88 y.o. female presenting today for follow-up evaluation of an open wound to the plantar aspect of the left foot.  Patient was last seen in the office on 04/21/2021 at which time light debridement was performed.  Nurse comes to the house twice a week and applies mupirocin ointment.  They state that there is some improvement.  They already have a follow-up appointment in March with our routine foot care specialist.  Past Medical History:  Diagnosis Date   Cancer ([place])    lung ca   COPD (chronic obstructive pulmonary disease) ([place])    Hernia, umbilical    Hip pain, right    History of radiation therapy 05/02/12-05/04/12,&05/09/12   rul lung 54Gy/49fx   HLD (hyperlipidemia)    Lung mass    Myocardial infarction ([place]) 08/2017   Osteoporosis    PAC (premature atrial contraction)    RA (rheumatoid arthritis) ([place])    Shingles     Past Surgical History:  Procedure Laterality Date   LEFT HEART CATH AND CORONARY ANGIOGRAPHY N/A 08/26/2017   Procedure: LEFT HEART CATH AND CORONARY ANGIOGRAPHY;  Surgeon: Martinique, Peter M, MD;  Location: [facility];  Service: Cardiovascular;  Laterality: N/A;   right lobectomy      Allergies  Allergen Reactions   Latex Other (See Comments)    Blisters on her mouth   Other Shortness Of Breath    Magic Mouthwash   Darvocet [Propoxyphene N-Acetaminophen] Nausea And Vomiting   Erythromycin Nausea And Vomiting    Other reaction(s): Unknown   Hydrocodone-Acetaminophen     Other reaction(s): dizzy  Other reaction(s): dizzy   Ibandronic Acid     Muscle aches Other reaction(s): aches   Morphine And Related Nausea And Vomiting   Morphine Sulfate     Other reaction(s): Unknown   Mouthwashes     Other reaction(s): sob   Oxycontin [Oxycodone Hcl] Nausea And Vomiting   Sulfa Antibiotics Nausea And Vomiting    Other reaction(s): Unknown   Tramadol Nausea And Vomiting     Physical Exam: General: The patient is alert and oriented x3 in no acute  distress.  Dermatology: Hyperkeratotic preulcerative callus lesions noted to the plantar aspect of the second MTP joint bilateral  Vascular: Chronic bilateral lower extremity edema noted  Neurological: Light touch and protective threshold grossly intact  Musculoskeletal Exam: No pedal deformities noted.  There is associated tenderness even with light touch to the preulcerative callus lesions bilateral  Assessment: 1.  Preulcerative callus lesions bilateral feet.   Plan of Care:  1. Patient evaluated. 2.  Light excisional debridement of the freshly healed preulcerative callus lesions was performed using a 312 scalpel without incident or bleeding.  Band-Aid applied.  Bilateral plantar second MTP joint 3.  Continue wearing good supportive shoes 4.  Return to clinic on neck scheduled appointment for routine foot care      Edrick Kins, DPM [facility]  Dr. Edrick Kins, DPM    [address], [place] [address]                Office [phone]  Fax [phone]

## 2021-05-05 ENCOUNTER — Ambulatory Visit: Payer: Medicare Other | Admitting: Podiatry

## 2021-06-23 ENCOUNTER — Other Ambulatory Visit: Payer: Self-pay

## 2021-06-23 ENCOUNTER — Ambulatory Visit: Payer: Medicare Other | Admitting: Podiatry

## 2021-06-23 DIAGNOSIS — M05771 Rheumatoid arthritis with rheumatoid factor of right ankle and foot without organ or systems involvement: Secondary | ICD-10-CM

## 2021-06-23 DIAGNOSIS — M05772 Rheumatoid arthritis with rheumatoid factor of left ankle and foot without organ or systems involvement: Secondary | ICD-10-CM

## 2021-06-23 DIAGNOSIS — Z872 Personal history of diseases of the skin and subcutaneous tissue: Secondary | ICD-10-CM

## 2021-06-25 ENCOUNTER — Other Ambulatory Visit: Payer: Self-pay | Admitting: Rheumatology

## 2021-06-25 MED ORDER — PREDNISONE 10 MG PO TABS: 10.0000 mg | ORAL_TABLET | Freq: Every day | ORAL | 0 refills | Status: DC

## 2021-06-25 NOTE — Telephone Encounter (Signed)
Next Visit: 07/28/2021 ?  ?Last Visit: 01/27/2021 ?  ?Last Fill: 04/07/2021 ?  ?Dx: Rheumatoid arthritis of multiple sites with negative rheumatoid factor ?  ?Current Dose per office note on 01/27/2021: Prednisone 10 mg by mouth daily ?  ?Okay to refill Prednisone?   ?

## 2021-06-25 NOTE — Telephone Encounter (Signed)
Patient called the office requesting a refill of Prednisone. Patient requests 6 month supply rather then 3 if possible. [facility] on [address]. Patient requests we call her daughter, [name], once its sent in. ?

## 2021-06-25 NOTE — Telephone Encounter (Signed)
Spoke with [name] and advised prescription has been sent to the pharmacy. Advised per protocol prescription is only sent for 90 days at a time. Advised to contact the office at the beginning of June for the next refill.  ?

## 2021-06-30 ENCOUNTER — Encounter: Payer: Self-pay | Admitting: Podiatry

## 2021-06-30 NOTE — Progress Notes (Signed)
Subjective:   ?Ms. Blaize Nipper presents for continued care of ulceration of left foot.  Patient has been receiving dressing changes to left foot utilizing Iodosorb Gel under care of [facility] Pt. denies any new complaints.  Patient denies any fever, chills, nightsweats, nausea or vomiting. ? ?She is accompanied by her friend, Manuela Schwartz, on today's visit. Her daughter, [name], is recuperating from knee surgery. ? ?Current Outpatient Medications on File Prior to Visit  ?Medication Sig Dispense Refill  ? acetaminophen (TYLENOL) 325 MG tablet Take 325 mg by mouth every 6 (six) hours as needed for moderate pain. Takes as Needed    ? carvedilol (COREG) 3.125 MG tablet Take 1 tablet (3.125 mg total) by mouth 2 (two) times daily with a meal. 60 tablet 0  ? diclofenac Sodium (VOLTAREN) 1 % GEL Apply 2 g topically 4 (four) times daily. 100 g 0  ? furosemide (LASIX) 20 MG tablet Take 20 mg by mouth daily.    ? Multiple Vitamins-Minerals (ALIVE ONCE DAILY WOMENS) TABS Take 1 tablet by mouth daily.    ? mupirocin ointment (BACTROBAN) 2 % Apply 1 application topically daily. 30 g 2  ? ondansetron (ZOFRAN) 4 MG tablet Take 1 tablet (4 mg total) by mouth every 6 (six) hours. 12 tablet 0  ? oxyCODONE-acetaminophen (PERCOCET/ROXICET) 5-325 MG tablet Take 1 tablet by mouth every 6 (six) hours as needed for severe pain. 15 tablet 0  ? senna-docusate (SENOKOT-S) 8.6-50 MG tablet Take 1 tablet by mouth at bedtime as needed for mild constipation or moderate constipation. 30 tablet 0  ? traMADol (ULTRAM) 50 MG tablet Take 50 mg by mouth every 6 (six) hours as needed for severe pain.    ? umeclidinium-vilanterol (ANORO ELLIPTA) 62.5-25 MCG/ACT AEPB 1 puff daily.    ? ?No current facility-administered medications on file prior to visit.  ?  ? ?Allergies  ?Allergen Reactions  ? Latex Other (See Comments)  ?  Blisters on her mouth  ? Other Shortness Of Breath  ?  Magic Mouthwash  ? Darvocet [Propoxyphene N-Acetaminophen] Nausea And  Vomiting  ? Erythromycin Nausea And Vomiting  ?  Other reaction(s): Unknown  ? Hydrocodone-Acetaminophen   ?  Other reaction(s): dizzy ? ?Other reaction(s): dizzy  ? Ibandronic Acid   ?  Muscle aches ?Other reaction(s): aches  ? Morphine And Related Nausea And Vomiting  ? Morphine Sulfate   ?  Other reaction(s): Unknown  ? Mouthwashes   ?  Other reaction(s): sob  ? Oxycodone Hcl   ?  Other reaction(s): Unknown ?Other reaction(s): Unknown  ? Oxycontin [Oxycodone Hcl] Nausea And Vomiting  ? Sulfa Antibiotics Nausea And Vomiting  ?  Other reaction(s): Unknown  ? Tramadol Nausea And Vomiting  ?  ? ?Objective:   ?There were no vitals filed for this visit.  ? ?Vascular Examination: ?CFT <3 seconds b/l LE. Palpable pedal pulses b/l LE. Pedal hair absent. No pain with calf compression b/l. Lower extremity skin temperature gradient within normal limits. +1 pitting edema noted BLE. ? ?Dermatological Examination: ?Pedal skin thin, shiny and atrophic b/l LE. Toenails recently debrided. ? ?No images are attached to the encounter. ? ?Wound Location: submet head 2 left foot ?There is a minimal amount of devitalized tissue present in the wound. ?Predebridement Wound Measurement:  1.0  x 1.0  cm. ?Postdebridement Wound Measurement: epithelialized. ?Wound Base: Epithelial tissue ?Peri-wound: Calloused ?Exudate: None: wound tissue dry ?Blood Loss during debridement: 0 cc('s). ?Material in wound which inhibits healing/promotes adjacent tissue breakdown:  nonviable hyperkeratosis. ?Description of tissue removed from ulceration today:  nonviable hyperkeratosis. ?Sign(s) of clinical bacterial infection: no clinical signs of infection noted on examination today.  ? ?Musculoskeletal Examination: ?Noted disuse atrophy bilaterally. HAV with bunion deformity noted b/l LE. Severe hammertoe deformity noted 2-5 bilaterally. Utilizes wheelchair for mobility assistance. ? ?Neurological Examination: ?Protective sensation intact 5/5 intact  bilaterally with 10g monofilament b/l. Vibratory sensation intact b/l. ? ?Assessment:   ?1. Healed ulcer of left foot ? ?2. Rheumatoid arthritis involving both feet with positive rheumatoid factor (HCC) ?  ?Plan: ?-Patient was evaluated and treated and all questions answered.  ?-Patient/POA/Family member educated on diagnosis and treatment plan of routine ulcer debridement/wound care.  ?-Ulceration debridement achieved utilizing dremel tool. Type/amount of devitalized tissue removed: nonviable hyperkeratosis ?-Today's ulcer size post-debridement: epithelialized. Ulcer is healed. ?-Wound responded well to today's debridement. ?-Patient risk factors affecting healing of ulcer: RA, immunosuppressive medications, history of foot/leg ulcer ?-Patient/POA to call should there be question/concern in the interim. ? ?Return in about 9 weeks (around 08/25/2021). ? ?Marzetta Board, DPM   ?
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
## 2021-07-25 ENCOUNTER — Other Ambulatory Visit: Payer: Self-pay

## 2021-07-25 ENCOUNTER — Emergency Department (HOSPITAL_COMMUNITY): Payer: Medicare Other

## 2021-07-25 ENCOUNTER — Inpatient Hospital Stay (HOSPITAL_COMMUNITY)
Admission: EM | Admit: 2021-07-25 | Discharge: 2021-08-06 | DRG: 871 | Disposition: A | Discharge status: Skilled Nursing Facility | Payer: Medicare Other | Attending: Internal Medicine | Admitting: Internal Medicine

## 2021-07-25 ENCOUNTER — Encounter (HOSPITAL_COMMUNITY): Payer: Self-pay | Admitting: *Deleted

## 2021-07-25 DIAGNOSIS — E785 Hyperlipidemia, unspecified: Secondary | ICD-10-CM | POA: Diagnosis present

## 2021-07-25 DIAGNOSIS — Z7952 Long term (current) use of systemic steroids: Secondary | ICD-10-CM

## 2021-07-25 DIAGNOSIS — N179 Acute kidney failure, unspecified: Secondary | ICD-10-CM

## 2021-07-25 DIAGNOSIS — Z91040 Latex allergy status: Secondary | ICD-10-CM

## 2021-07-25 DIAGNOSIS — N390 Urinary tract infection, site not specified: Secondary | ICD-10-CM | POA: Diagnosis present

## 2021-07-25 DIAGNOSIS — A419 Sepsis, unspecified organism: Secondary | ICD-10-CM | POA: Diagnosis not present

## 2021-07-25 DIAGNOSIS — H9190 Unspecified hearing loss, unspecified ear: Secondary | ICD-10-CM | POA: Diagnosis present

## 2021-07-25 DIAGNOSIS — U071 COVID-19: Secondary | ICD-10-CM | POA: Diagnosis not present

## 2021-07-25 DIAGNOSIS — Z79891 Long term (current) use of opiate analgesic: Secondary | ICD-10-CM

## 2021-07-25 DIAGNOSIS — A4189 Other specified sepsis: Principal | ICD-10-CM | POA: Diagnosis present

## 2021-07-25 DIAGNOSIS — Z923 Personal history of irradiation: Secondary | ICD-10-CM

## 2021-07-25 DIAGNOSIS — Z902 Acquired absence of lung [part of]: Secondary | ICD-10-CM

## 2021-07-25 DIAGNOSIS — Z881 Allergy status to other antibiotic agents status: Secondary | ICD-10-CM

## 2021-07-25 DIAGNOSIS — J9601 Acute respiratory failure with hypoxia: Secondary | ICD-10-CM | POA: Diagnosis present

## 2021-07-25 DIAGNOSIS — I10 Essential (primary) hypertension: Secondary | ICD-10-CM | POA: Diagnosis present

## 2021-07-25 DIAGNOSIS — Z882 Allergy status to sulfonamides status: Secondary | ICD-10-CM

## 2021-07-25 DIAGNOSIS — Z79899 Other long term (current) drug therapy: Secondary | ICD-10-CM

## 2021-07-25 DIAGNOSIS — M069 Rheumatoid arthritis, unspecified: Secondary | ICD-10-CM | POA: Diagnosis present

## 2021-07-25 DIAGNOSIS — F064 Anxiety disorder due to known physiological condition: Secondary | ICD-10-CM | POA: Diagnosis present

## 2021-07-25 DIAGNOSIS — J690 Pneumonitis due to inhalation of food and vomit: Secondary | ICD-10-CM | POA: Diagnosis present

## 2021-07-25 DIAGNOSIS — J1282 Pneumonia due to coronavirus disease 2019: Secondary | ICD-10-CM | POA: Diagnosis present

## 2021-07-25 DIAGNOSIS — R6520 Severe sepsis without septic shock: Secondary | ICD-10-CM | POA: Diagnosis present

## 2021-07-25 DIAGNOSIS — R5381 Other malaise: Secondary | ICD-10-CM | POA: Diagnosis present

## 2021-07-25 DIAGNOSIS — I959 Hypotension, unspecified: Secondary | ICD-10-CM

## 2021-07-25 DIAGNOSIS — M810 Age-related osteoporosis without current pathological fracture: Secondary | ICD-10-CM | POA: Diagnosis present

## 2021-07-25 DIAGNOSIS — J440 Chronic obstructive pulmonary disease with acute lower respiratory infection: Secondary | ICD-10-CM | POA: Diagnosis present

## 2021-07-25 DIAGNOSIS — Z85118 Personal history of other malignant neoplasm of bronchus and lung: Secondary | ICD-10-CM

## 2021-07-25 DIAGNOSIS — I9589 Other hypotension: Secondary | ICD-10-CM | POA: Diagnosis not present

## 2021-07-25 DIAGNOSIS — E861 Hypovolemia: Secondary | ICD-10-CM

## 2021-07-25 DIAGNOSIS — I252 Old myocardial infarction: Secondary | ICD-10-CM

## 2021-07-25 DIAGNOSIS — J441 Chronic obstructive pulmonary disease with (acute) exacerbation: Secondary | ICD-10-CM | POA: Diagnosis present

## 2021-07-25 DIAGNOSIS — Z885 Allergy status to narcotic agent status: Secondary | ICD-10-CM

## 2021-07-25 DIAGNOSIS — Z66 Do not resuscitate: Secondary | ICD-10-CM | POA: Diagnosis present

## 2021-07-25 DIAGNOSIS — M0579 Rheumatoid arthritis with rheumatoid factor of multiple sites without organ or systems involvement: Secondary | ICD-10-CM

## 2021-07-25 DIAGNOSIS — Z888 Allergy status to other drugs, medicaments and biological substances status: Secondary | ICD-10-CM

## 2021-07-25 DIAGNOSIS — Z87891 Personal history of nicotine dependence: Secondary | ICD-10-CM

## 2021-07-25 DIAGNOSIS — I2510 Atherosclerotic heart disease of native coronary artery without angina pectoris: Secondary | ICD-10-CM | POA: Diagnosis present

## 2021-07-25 DIAGNOSIS — R197 Diarrhea, unspecified: Secondary | ICD-10-CM | POA: Diagnosis present

## 2021-07-25 HISTORY — DX: COVID-19: U07.1

## 2021-07-25 HISTORY — DX: Hypotension, unspecified: I95.9

## 2021-07-25 HISTORY — DX: Acute kidney failure, unspecified: N17.9

## 2021-07-25 LAB — CBC WITH DIFFERENTIAL/PLATELET
Abs Immature Granulocytes: 0.07 10*3/uL (ref 0.00–0.07)
Basophils Absolute: 0 10*3/uL (ref 0.0–0.1)
Basophils Relative: 0 %
Eosinophils Absolute: 0 10*3/uL (ref 0.0–0.5)
Eosinophils Relative: 0 %
HCT: 38.1 % (ref 36.0–46.0)
Hemoglobin: 11.9 g/dL — ABNORMAL LOW (ref 12.0–15.0)
Immature Granulocytes: 1 %
Lymphocytes Relative: 14 %
Lymphs Abs: 1.2 10*3/uL (ref 0.7–4.0)
MCH: 32.5 pg (ref 26.0–34.0)
MCHC: 31.2 g/dL (ref 30.0–36.0)
MCV: 104.1 fL — ABNORMAL HIGH (ref 80.0–100.0)
Monocytes Absolute: 1.4 10*3/uL — ABNORMAL HIGH (ref 0.1–1.0)
Monocytes Relative: 17 %
Neutro Abs: 5.5 10*3/uL (ref 1.7–7.7)
Neutrophils Relative %: 68 %
Platelets: 159 10*3/uL (ref 150–400)
RBC: 3.66 MIL/uL — ABNORMAL LOW (ref 3.87–5.11)
RDW: 14.3 % (ref 11.5–15.5)
WBC: 8.3 10*3/uL (ref 4.0–10.5)
nRBC: 0 % (ref 0.0–0.2)

## 2021-07-25 LAB — URINALYSIS, ROUTINE W REFLEX MICROSCOPIC
Bilirubin Urine: NEGATIVE
Glucose, UA: NEGATIVE mg/dL
Ketones, ur: NEGATIVE mg/dL
Nitrite: POSITIVE — AB
Protein, ur: 30 mg/dL — AB
Specific Gravity, Urine: 1.015 (ref 1.005–1.030)
pH: 6 (ref 5.0–8.0)

## 2021-07-25 LAB — COMPREHENSIVE METABOLIC PANEL
ALT: 11 U/L (ref 0–44)
AST: 21 U/L (ref 15–41)
Albumin: 3.4 g/dL — ABNORMAL LOW (ref 3.5–5.0)
Alkaline Phosphatase: 36 U/L — ABNORMAL LOW (ref 38–126)
Anion gap: 9 (ref 5–15)
BUN: 26 mg/dL — ABNORMAL HIGH (ref 8–23)
CO2: 28 mmol/L (ref 22–32)
Calcium: 8.8 mg/dL — ABNORMAL LOW (ref 8.9–10.3)
Chloride: 103 mmol/L (ref 98–111)
Creatinine, Ser: 1.29 mg/dL — ABNORMAL HIGH (ref 0.44–1.00)
GFR, Estimated: 40 mL/min — ABNORMAL LOW (ref >60)
Glucose, Bld: 109 mg/dL — ABNORMAL HIGH (ref 70–99)
Potassium: 4.3 mmol/L (ref 3.5–5.1)
Sodium: 140 mmol/L (ref 135–145)
Total Bilirubin: 0.5 mg/dL (ref 0.3–1.2)
Total Protein: 6.1 g/dL — ABNORMAL LOW (ref 6.5–8.1)

## 2021-07-25 LAB — PROTIME-INR
INR: 1 (ref 0.8–1.2)
Prothrombin Time: 13.4 seconds (ref 11.4–15.2)

## 2021-07-25 LAB — RESP PANEL BY RT-PCR (FLU A&B, COVID) ARPGX2
Influenza A by PCR: NEGATIVE
Influenza B by PCR: NEGATIVE
SARS Coronavirus 2 by RT PCR: POSITIVE — AB

## 2021-07-25 LAB — LACTIC ACID, PLASMA
Lactic Acid, Venous: 2 mmol/L (ref 0.5–1.9)
Lactic Acid, Venous: 1.6 mmol/L (ref 0.5–1.9)

## 2021-07-25 MED ORDER — LACTATED RINGERS IV BOLUS (SEPSIS)
1000.0000 mL | Freq: Once | INTRAVENOUS | Status: AC
  Administered 2021-07-25: 1000 mL via INTRAVENOUS

## 2021-07-25 MED ORDER — LACTATED RINGERS IV SOLN: INTRAVENOUS | Status: DC

## 2021-07-25 MED ORDER — NIRMATRELVIR/RITONAVIR (PAXLOVID) TABLET (RENAL DOSING)
2.0000 | ORAL_TABLET | Freq: Two times a day (BID) | ORAL | Status: DC
  Administered 2021-07-26 (×2): 2 via ORAL
  Filled 2021-07-25: qty 20

## 2021-07-25 MED ORDER — SODIUM CHLORIDE 0.9 % IV SOLN
2.0000 g | INTRAVENOUS | Status: DC
  Administered 2021-07-25: 2 g via INTRAVENOUS
  Filled 2021-07-25: qty 20

## 2021-07-25 MED ORDER — NIRMATRELVIR/RITONAVIR (PAXLOVID)TABLET: 3.0000 | ORAL_TABLET | Freq: Two times a day (BID) | ORAL | Status: DC

## 2021-07-25 MED ORDER — ENOXAPARIN SODIUM 30 MG/0.3ML IJ SOSY
30.0000 mg | PREFILLED_SYRINGE | Freq: Every day | INTRAMUSCULAR | Status: DC
  Administered 2021-07-26 – 2021-08-06 (×12): 30 mg via SUBCUTANEOUS
  Filled 2021-07-25 (×12): qty 0.3

## 2021-07-25 MED ORDER — SODIUM CHLORIDE 0.9 % IV SOLN
100.0000 mg | Freq: Two times a day (BID) | INTRAVENOUS | Status: DC
  Administered 2021-07-25: 100 mg via INTRAVENOUS
  Filled 2021-07-25 (×2): qty 100

## 2021-07-25 MED ORDER — CARVEDILOL 3.125 MG PO TABS: 3.1250 mg | ORAL_TABLET | Freq: Two times a day (BID) | ORAL | Status: DC

## 2021-07-25 MED ORDER — PREDNISONE 5 MG PO TABS
10.0000 mg | ORAL_TABLET | Freq: Every day | ORAL | Status: DC
  Administered 2021-07-26: 10 mg via ORAL
  Filled 2021-07-25: qty 2

## 2021-07-25 MED ORDER — ACETAMINOPHEN 160 MG/5ML PO SOLN
650.0000 mg | Freq: Once | ORAL | Status: AC
  Administered 2021-07-25: 650 mg via ORAL
  Filled 2021-07-25: qty 20.3

## 2021-07-25 NOTE — Assessment & Plan Note (Addendum)
Secondary to COVID-19 viral infection.  Presented with fever, and tachypnea with positive COVID PCR. ?-Initially was given antibiotic coverage for presumed pneumonia.  Will discontinue at this time as her chest x-ray was negative and suspect symptoms are all due to COVID. ?-Start Paxlovid ?-Has received 1 L normal saline in the ED.  Will continuous IV fluids overnight ?

## 2021-07-25 NOTE — Assessment & Plan Note (Addendum)
-   Creatinine mildly elevated 1.29.  Continuous IV fluids overnight. ?-Hold home Lasix ?

## 2021-07-25 NOTE — ED Provider Notes (Signed)
?[name] ?Provider Note ? ? ?CSN: [id number] ?Arrival date & time: 07/25/21  1751 ? ?  ? ?History ? ?Chief Complaint  ?Patient presents with  ? Shortness of Breath  ? ? ?[name] is a 88 y.o. female. ? ?HPI ? ?  ? ?88 year old female with history of COPD, CAD, rheumatoid arthritis comes in with chief complaint of shortness of breath. ? ?Patient here with her daughter.  The daughter indicates that patient was last seen by her on Monday.  She was doing well.  The other sister had seen her on Thursday and patient was fine.  This morning when they called her patient indicated that she had a rough night last night.  When they went to check on her, she was complaining of weakness and shortness of breath.  She looked unwell and they decided to bring her into the ER. ? ?Patient noted to be febrile in the ED.  Patient indicates that she has been having some cough, it is dry.  She denies any UTI-like symptoms and there is no history of recurrent bladder infection.  She denies any abdominal pain -but does indicate that she is feeling cold, weak and family thinks patient was having sweats earlier. ? ?Home Medications ?Prior to Admission medications   ?Medication Sig Start Date End Date Taking? Authorizing Provider  ?acetaminophen (TYLENOL) 325 MG tablet Take 325 mg by mouth every 6 (six) hours as needed for moderate pain. Takes as Needed    [provider]  ?carvedilol (COREG) 3.125 MG tablet Take 1 tablet (3.125 mg total) by mouth 2 (two) times daily with a meal. 10/14/17   Medina-Vargas, Monina C, NP  ?diclofenac Sodium (VOLTAREN) 1 % GEL Apply 2 g topically 4 (four) times daily. 03/14/21   Long, Wonda Olds, MD  ?furosemide (LASIX) 20 MG tablet Take 20 mg by mouth daily. 01/31/19   [provider]  ?Multiple Vitamins-Minerals (ALIVE ONCE DAILY WOMENS) TABS Take 1 tablet by mouth daily.    [provider]  ?mupirocin ointment (BACTROBAN) 2 % Apply 1  application topically daily. 04/21/21   McDonald, Stephan Minister, DPM  ?ondansetron (ZOFRAN) 4 MG tablet Take 1 tablet (4 mg total) by mouth every 6 (six) hours. 03/14/21   Long, Wonda Olds, MD  ?oxyCODONE-acetaminophen (PERCOCET/ROXICET) 5-325 MG tablet Take 1 tablet by mouth every 6 (six) hours as needed for severe pain. 03/14/21   Long, Wonda Olds, MD  ?predniSONE (DELTASONE) 10 MG tablet Take 1 tablet (10 mg total) by mouth daily with breakfast. 06/25/21   Bo Merino, MD  ?senna-docusate (SENOKOT-S) 8.6-50 MG tablet Take 1 tablet by mouth at bedtime as needed for mild constipation or moderate constipation. 03/14/21   Long, Wonda Olds, MD  ?traMADol (ULTRAM) 50 MG tablet Take 50 mg by mouth every 6 (six) hours as needed for severe pain. 10/16/19   [provider]  ?umeclidinium-vilanterol (ANORO ELLIPTA) 62.5-25 MCG/ACT AEPB 1 puff daily. 02/11/21   [provider]  ?   ? ?Allergies    ?Latex, Other, Darvocet [propoxyphene n-acetaminophen], Erythromycin, Hydrocodone-acetaminophen, Ibandronic acid, Morphine and related, Morphine sulfate, Mouthwashes, Oxycodone hcl, Oxycontin [oxycodone hcl], Sulfa antibiotics, and Tramadol   ? ?Review of Systems   ?Review of Systems  ?All other systems reviewed and are negative. ? ?Physical Exam ?Updated Vital Signs ?BP (!) 97/47   Pulse 67   Temp (!) 103 ?F (39.4 ?C) (Oral)   Resp (!) 27   Wt 46.3 kg   SpO2  94%   BMI 22.87 kg/m?  ?Physical Exam ?Vitals and nursing note reviewed.  ?Constitutional:   ?   Appearance: She is well-developed.  ?HENT:  ?   Head: Atraumatic.  ?Cardiovascular:  ?   Rate and Rhythm: Normal rate.  ?Pulmonary:  ?   Effort: Pulmonary effort is normal.  ?   Breath sounds: No decreased breath sounds, wheezing, rhonchi or rales.  ?Musculoskeletal:  ?   Cervical back: Normal range of motion and neck supple.  ?   Right lower leg: No tenderness. Edema present.  ?   Left lower leg: No tenderness. Edema present.  ?   Comments: Right lower extremity  chronically swollen compared to left  ?Skin: ?   General: Skin is warm and dry.  ?Neurological:  ?   Mental Status: She is alert and oriented to person, place, and time.  ? ? ?ED Results / Procedures / Treatments   ?Labs ?(all labs ordered are listed, but only abnormal results are displayed) ?Labs Reviewed  ?RESP PANEL BY RT-PCR (FLU A&B, COVID) ARPGX2 - Abnormal; Notable for the following components:  ?    Result Value  ? SARS Coronavirus 2 by RT PCR POSITIVE (*)   ? All other components within normal limits  ?COMPREHENSIVE METABOLIC PANEL - Abnormal; Notable for the following components:  ? Glucose, Bld 109 (*)   ? BUN 26 (*)   ? Creatinine, Ser 1.29 (*)   ? Calcium 8.8 (*)   ? Total Protein 6.1 (*)   ? Albumin 3.4 (*)   ? Alkaline Phosphatase 36 (*)   ? GFR, Estimated 40 (*)   ? All other components within normal limits  ?LACTIC ACID, PLASMA - Abnormal; Notable for the following components:  ? Lactic Acid, Venous 2.0 (*)   ? All other components within normal limits  ?CBC WITH DIFFERENTIAL/PLATELET - Abnormal; Notable for the following components:  ? RBC 3.66 (*)   ? Hemoglobin 11.9 (*)   ? MCV 104.1 (*)   ? Monocytes Absolute 1.4 (*)   ? All other components within normal limits  ?CULTURE, BLOOD (ROUTINE X 2)  ?CULTURE, BLOOD (ROUTINE X 2)  ?PROTIME-INR  ?LACTIC ACID, PLASMA  ?URINALYSIS, ROUTINE W REFLEX MICROSCOPIC  ? ? ?EKG ?EKG Interpretation ? ?Date/Time:  Saturday July 25 2021 18:04:07 EDT ?Ventricular Rate:  77 ?PR Interval:  137 ?QRS Duration: 79 ?QT Interval:  402 ?QTC Calculation: 455 ?R Axis:   -68 ?Text Interpretation: Sinus rhythm Atrial premature complex Left anterior fascicular block Abnormal R-wave progression, late transition Nonspecific ST and T wave abnormality Confirmed by Varney Biles [phone]) on 07/25/2021 8:49:30 PM ? ?Radiology ?DG Chest Portable 1 View ? ?Result Date: 07/25/2021 ?CLINICAL DATA:  Shortness of breath and fever EXAM: PORTABLE CHEST 1 VIEW COMPARISON:  08/25/2017, CT  03/27/2020 FINDINGS: Similar post treatment changes of the right chest with volume loss and right upper lobe parenchymal scarring. Retraction of right hilus as before. No acute airspace disease. Stable cardiac size. IMPRESSION: No active disease.  Similar post treatment changes on the right Electronically Signed   By: Donavan Foil M.D.   On: 07/25/2021 19:05   ? ?Procedures ?Marland KitchenCritical Care ?Performed by: Varney Biles, MD ?Authorized by: Varney Biles, MD  ? ?Critical care provider statement:  ?  Critical care time (minutes):  38 ?  Critical care was necessary to treat or prevent imminent or life-threatening deterioration of the following conditions:  Respiratory failure ?  Critical care was time spent personally by  me on the following activities:  Development of treatment plan with patient or surrogate, discussions with consultants, evaluation of patient's response to treatment, examination of patient, ordering and review of laboratory studies, ordering and review of radiographic studies, ordering and performing treatments and interventions, pulse oximetry, re-evaluation of patient's condition and review of old charts  ? ? ?Medications Ordered in ED ?Medications  ?lactated ringers infusion ( Intravenous Not Given 07/25/21 1958)  ?cefTRIAXone (ROCEPHIN) 2 g in sodium chloride 0.9 % 100 mL IVPB (0 g Intravenous Stopped 07/25/21 2026)  ?doxycycline (VIBRAMYCIN) 100 mg in sodium chloride 0.9 % 250 mL IVPB (100 mg Intravenous New Bag/Given 07/25/21 2039)  ?acetaminophen (TYLENOL) 160 MG/5ML solution 650 mg (650 mg Oral Given 07/25/21 1831)  ?lactated ringers bolus 1,000 mL (1,000 mLs Intravenous New Bag/Given 07/25/21 1957)  ? ? ?ED Course/ Medical Decision Making/ A&P ?  ?                        ?Medical Decision Making ?Amount and/or Complexity of Data Reviewed ?Labs: ordered. ?Radiology: ordered. ? ?Risk ?OTC drugs. ?Prescription drug management. ?Decision regarding hospitalization. ? ? ?This patient presents to the ED  with chief complaint(s) of feeling unwell, shortness of breath, congestion with pertinent past medical history of CAD, COPD, rheumatoid arthritis which further complicates the presenting complaint. The complaint involves an extens

## 2021-07-25 NOTE — ED Notes (Signed)
Lactic acid resulted, instructed to wait until IV bolus is finished infusing then redraw the 2nd lactic.  ?

## 2021-07-25 NOTE — Progress Notes (Signed)
Pt being followed by [facility] for Sepsis protocol. 

## 2021-07-25 NOTE — ED Notes (Addendum)
EDP and daughter at [facility]. Pt alert, NAD, calm, interactive, speaking in complete phrases. Admits to not taking her heart med, prednisone or lasix. ?

## 2021-07-25 NOTE — ED Notes (Signed)
Critical lab called by lab: Lactic Acid 2.0. MD notified.  ?

## 2021-07-25 NOTE — ED Triage Notes (Signed)
BIB [facility] from home, lives with family/present, poor historians, h/o CHF vs COPD or both, here for SOB, rhonchi all over, tachypneic, increased wob, feels hot. No IV or meds given. SPO2 improved with O2. Pt HOH, alert, NAD, calm, increased wob, and tachypneic. ?

## 2021-07-25 NOTE — Assessment & Plan Note (Signed)
Continue home chronic 10 mg prednisone ?

## 2021-07-25 NOTE — H&P (Signed)
?History and Physical  ? ? ?Patient: [name] ZHY:[id number] DOB: 06-Sep-1929 ?DOA: 07/25/2021 ?DOS: the patient was seen and examined on 07/25/2021 ?PCP: Mayra Neer, MD  ?Patient coming from: [facility] ? ?Chief Complaint:  ?Chief Complaint  ?Patient presents with  ? Shortness of Breath  ? ?HPI: [name] is a 88 y.o. female with medical history significant of rheumatoid arthritis on chronic prednisone, remote history of lung cancer, COPD and CAD who presents with concerns of increasing shortness of breath. ? ?Patient is a limited historian due to hearing impairment.  She is coming from an independent living facility and about to 3 days ago developed increasing shortness of breath with intermittent cough.  Denies any chest pain.  No nausea, vomiting or diarrhea. ? ?In the ED, she was febrile up to 103F, hypotensive with BP of 90 over 40s, tachypneic with RR of 27 but stable on room air.  No leukocytosis with hemoglobin of 11.9.  Creatinine is mildly elevated 1.29 with otherwise normal electrolytes. ? ?Chest x-ray is negative. ?Patient initially treated with IV Rocephin and doxycycline for presumed pneumonia.  However, COVID PCR later returned positive. ?Review of Systems: As mentioned in the history of present illness. All other systems reviewed and are negative. ?Past Medical History:  ?Diagnosis Date  ? Cancer [facility])   ? lung ca  ? COPD (chronic obstructive pulmonary disease) ([place])   ? Hernia, umbilical   ? Hip pain, right   ? History of radiation therapy 05/02/12-05/04/12,&05/09/12  ? rul lung 54Gy/7fx  ? HLD (hyperlipidemia)   ? Lung mass   ? Myocardial infarction ([place]) 08/2017  ? Osteoporosis   ? PAC (premature atrial contraction)   ? RA (rheumatoid arthritis) ([place])   ? Shingles   ? ?Past Surgical History:  ?Procedure Laterality Date  ? LEFT HEART CATH AND CORONARY ANGIOGRAPHY N/A 08/26/2017  ? Procedure: LEFT HEART CATH AND CORONARY ANGIOGRAPHY;  Surgeon: Martinique, Peter M, MD;  Location: [facility];   Service: Cardiovascular;  Laterality: N/A;  ? right lobectomy    ? ?Social History:  reports that she quit smoking about 28 years ago. Her smoking use included cigarettes. She has never used smokeless tobacco. She reports that she does not drink alcohol and does not use drugs. ? ?Allergies  ?Allergen Reactions  ? Latex Other (See Comments)  ?  Blisters on her mouth  ? Other Shortness Of Breath  ?  Magic Mouthwash  ? Darvocet [Propoxyphene N-Acetaminophen] Nausea And Vomiting  ? Erythromycin Nausea And Vomiting  ?  Other reaction(s): Unknown  ? Hydrocodone-Acetaminophen   ?  Other reaction(s): dizzy ? ?Other reaction(s): dizzy  ? Ibandronic Acid   ?  Muscle aches ?Other reaction(s): aches  ? Morphine And Related Nausea And Vomiting  ? Morphine Sulfate   ?  Other reaction(s): Unknown  ? Mouthwashes   ?  Other reaction(s): sob  ? Oxycodone Hcl   ?  Other reaction(s): Unknown ?Other reaction(s): Unknown  ? Oxycontin [Oxycodone Hcl] Nausea And Vomiting  ? Sulfa Antibiotics Nausea And Vomiting  ?  Other reaction(s): Unknown  ? Tramadol Nausea And Vomiting  ? ? ?Family History  ?Problem Relation Age of Onset  ? Cancer Daughter   ? Cancer Mother   ? ? ?Prior to Admission medications   ?Medication Sig Start Date End Date Taking? Authorizing Provider  ?acetaminophen (TYLENOL) 325 MG tablet Take 325 mg by mouth every 6 (six) hours as needed for moderate pain. Takes as Needed  [provider]  ?carvedilol (COREG) 3.125 MG tablet Take 1 tablet (3.125 mg total) by mouth 2 (two) times daily with a meal. 10/14/17   Medina-Vargas, Monina C, NP  ?diclofenac Sodium (VOLTAREN) 1 % GEL Apply 2 g topically 4 (four) times daily. 03/14/21   Long, Wonda Olds, MD  ?furosemide (LASIX) 20 MG tablet Take 20 mg by mouth daily. 01/31/19   [provider]  ?Multiple Vitamins-Minerals (ALIVE ONCE DAILY WOMENS) TABS Take 1 tablet by mouth daily.    [provider]  ?mupirocin ointment (BACTROBAN) 2 % Apply 1 application  topically daily. 04/21/21   McDonald, Stephan Minister, DPM  ?ondansetron (ZOFRAN) 4 MG tablet Take 1 tablet (4 mg total) by mouth every 6 (six) hours. 03/14/21   Long, Wonda Olds, MD  ?oxyCODONE-acetaminophen (PERCOCET/ROXICET) 5-325 MG tablet Take 1 tablet by mouth every 6 (six) hours as needed for severe pain. 03/14/21   Long, Wonda Olds, MD  ?predniSONE (DELTASONE) 10 MG tablet Take 1 tablet (10 mg total) by mouth daily with breakfast. 06/25/21   Bo Merino, MD  ?senna-docusate (SENOKOT-S) 8.6-50 MG tablet Take 1 tablet by mouth at bedtime as needed for mild constipation or moderate constipation. 03/14/21   Long, Wonda Olds, MD  ?traMADol (ULTRAM) 50 MG tablet Take 50 mg by mouth every 6 (six) hours as needed for severe pain. 10/16/19   [provider]  ?umeclidinium-vilanterol (ANORO ELLIPTA) 62.5-25 MCG/ACT AEPB 1 puff daily. 02/11/21   [provider]  ? ? ?Physical Exam: ?Vitals:  ? 07/25/21 2230 07/25/21 2245 07/25/21 2300 07/25/21 2313  ?BP:   (!) 120/46   ?Pulse: 61 63 63   ?Resp: (!) 25 (!) 23 14   ?Temp:    99.1 ?F (37.3 ?C)  ?TempSrc:    Oral  ?SpO2: 92% 93% 94%   ?Weight:      ? ?Constitutional: NAD, calm, comfortable, elderly female laying in bed asleep ?Eyes: lids and conjunctivae normal ?ENMT: Mucous membranes are moist. Posterior pharynx clear of any exudate or lesions.Normal dentition.  ?Neck: normal, supple ?Respiratory: Diffuse rhonchi but no crackles or wheezes.  Normal respiratory effort on room air. No accessory muscle use.  ?Cardiovascular: Regular rate and rhythm, no murmurs / rubs / gallops. No extremity edema.   ?Abdomen: no tenderness,  Bowel sounds positive.  ?Musculoskeletal: no clubbing / cyanosis.  Arthritic deformities of the PIP joints of the hand and feet. Normal muscle tone.  ?Skin: no rashes, lesions, ulcers. No induration ?Neurologic: CN 2-12 grossly intact.  Strength 5/5 in all 4.  ?Psychiatric: Normal judgment and insight. Alert and oriented x 3. Normal mood. ?Data  Reviewed: ? ?See HPI ? ?Assessment and Plan: ?Sepsis ([place]) ?Secondary to COVID-19 viral infection.  Presented with fever, and tachypnea with positive COVID PCR. ?-Initially was given antibiotic coverage for presumed pneumonia.  Will discontinue at this time as her chest x-ray was negative and suspect symptoms are all due to COVID. ?-Start Paxlovid ?-Has received 1 L normal saline in the ED.  Will continuous IV fluids overnight ? ?Hypotension ?BP of 90 over 40s on admitted.  Continue IV fluid resuscitation overnight.  Hold home Coreg. ? ?AKI (acute kidney injury) ([place]) ?- Creatinine mildly elevated 1.29.  Continuous IV fluids overnight. ?-Hold home Lasix ? ?Rheumatoid arthritis-severe ?Continue home chronic 10 mg prednisone ? ? ? ? ? Advance Care Planning:   Code Status: Full Code  ? ?Consults: NONE ? ?Family Communication: Patient does not want daughter to be updated until the  morning ? ?Severity of Illness: ?The appropriate patient status for this patient is OBSERVATION. Observation status is judged to be reasonable and necessary in order to provide the required intensity of service to ensure the patient's safety. The patient's presenting symptoms, physical exam findings, and initial radiographic and laboratory data in the context of their medical condition is felt to place them at decreased risk for further clinical deterioration. Furthermore, it is anticipated that the patient will be medically stable for discharge from the hospital within 2 midnights of admission.  ? ?Author: Orene Desanctis, DO ?07/25/2021 11:16 PM ? ?For on call review [email or web address].  ?

## 2021-07-25 NOTE — Assessment & Plan Note (Signed)
BP of 90 over 40s on admitted.  Continue IV fluid resuscitation overnight.  Hold home Coreg. ?

## 2021-07-25 NOTE — ED Notes (Signed)
Xray at [facility]. Pt cooperative. O2 turned off to assess RA SPO2. ?

## 2021-07-26 ENCOUNTER — Observation Stay (HOSPITAL_COMMUNITY): Payer: Medicare Other

## 2021-07-26 ENCOUNTER — Other Ambulatory Visit: Payer: Self-pay

## 2021-07-26 DIAGNOSIS — Z66 Do not resuscitate: Secondary | ICD-10-CM | POA: Diagnosis present

## 2021-07-26 DIAGNOSIS — I252 Old myocardial infarction: Secondary | ICD-10-CM | POA: Diagnosis not present

## 2021-07-26 DIAGNOSIS — Z7952 Long term (current) use of systemic steroids: Secondary | ICD-10-CM | POA: Diagnosis not present

## 2021-07-26 DIAGNOSIS — N390 Urinary tract infection, site not specified: Secondary | ICD-10-CM | POA: Diagnosis present

## 2021-07-26 DIAGNOSIS — Z79899 Other long term (current) drug therapy: Secondary | ICD-10-CM | POA: Diagnosis not present

## 2021-07-26 DIAGNOSIS — I2510 Atherosclerotic heart disease of native coronary artery without angina pectoris: Secondary | ICD-10-CM | POA: Diagnosis present

## 2021-07-26 DIAGNOSIS — R6520 Severe sepsis without septic shock: Secondary | ICD-10-CM | POA: Diagnosis present

## 2021-07-26 DIAGNOSIS — R5381 Other malaise: Secondary | ICD-10-CM | POA: Diagnosis present

## 2021-07-26 DIAGNOSIS — M0579 Rheumatoid arthritis with rheumatoid factor of multiple sites without organ or systems involvement: Secondary | ICD-10-CM | POA: Diagnosis not present

## 2021-07-26 DIAGNOSIS — J690 Pneumonitis due to inhalation of food and vomit: Secondary | ICD-10-CM | POA: Diagnosis present

## 2021-07-26 DIAGNOSIS — M810 Age-related osteoporosis without current pathological fracture: Secondary | ICD-10-CM | POA: Diagnosis present

## 2021-07-26 DIAGNOSIS — Z79891 Long term (current) use of opiate analgesic: Secondary | ICD-10-CM | POA: Diagnosis not present

## 2021-07-26 DIAGNOSIS — J440 Chronic obstructive pulmonary disease with acute lower respiratory infection: Secondary | ICD-10-CM | POA: Diagnosis present

## 2021-07-26 DIAGNOSIS — U071 COVID-19: Secondary | ICD-10-CM | POA: Diagnosis present

## 2021-07-26 DIAGNOSIS — J9601 Acute respiratory failure with hypoxia: Secondary | ICD-10-CM | POA: Diagnosis present

## 2021-07-26 DIAGNOSIS — I10 Essential (primary) hypertension: Secondary | ICD-10-CM | POA: Diagnosis present

## 2021-07-26 DIAGNOSIS — M069 Rheumatoid arthritis, unspecified: Secondary | ICD-10-CM | POA: Diagnosis present

## 2021-07-26 DIAGNOSIS — H9190 Unspecified hearing loss, unspecified ear: Secondary | ICD-10-CM | POA: Diagnosis present

## 2021-07-26 DIAGNOSIS — J1282 Pneumonia due to coronavirus disease 2019: Secondary | ICD-10-CM | POA: Diagnosis present

## 2021-07-26 DIAGNOSIS — N179 Acute kidney failure, unspecified: Secondary | ICD-10-CM | POA: Diagnosis present

## 2021-07-26 DIAGNOSIS — E785 Hyperlipidemia, unspecified: Secondary | ICD-10-CM | POA: Diagnosis present

## 2021-07-26 DIAGNOSIS — R197 Diarrhea, unspecified: Secondary | ICD-10-CM | POA: Diagnosis present

## 2021-07-26 DIAGNOSIS — A419 Sepsis, unspecified organism: Secondary | ICD-10-CM | POA: Diagnosis present

## 2021-07-26 DIAGNOSIS — A4189 Other specified sepsis: Secondary | ICD-10-CM | POA: Diagnosis present

## 2021-07-26 DIAGNOSIS — I9589 Other hypotension: Secondary | ICD-10-CM | POA: Diagnosis not present

## 2021-07-26 DIAGNOSIS — J441 Chronic obstructive pulmonary disease with (acute) exacerbation: Secondary | ICD-10-CM | POA: Diagnosis present

## 2021-07-26 DIAGNOSIS — F064 Anxiety disorder due to known physiological condition: Secondary | ICD-10-CM | POA: Diagnosis present

## 2021-07-26 LAB — CBC WITH DIFFERENTIAL/PLATELET
Abs Immature Granulocytes: 0.03 10*3/uL (ref 0.00–0.07)
Basophils Absolute: 0 10*3/uL (ref 0.0–0.1)
Basophils Relative: 0 %
Eosinophils Absolute: 0 10*3/uL (ref 0.0–0.5)
Eosinophils Relative: 0 %
HCT: 32.6 % — ABNORMAL LOW (ref 36.0–46.0)
Hemoglobin: 10.5 g/dL — ABNORMAL LOW (ref 12.0–15.0)
Immature Granulocytes: 1 %
Lymphocytes Relative: 18 %
Lymphs Abs: 1 10*3/uL (ref 0.7–4.0)
MCH: 33.5 pg (ref 26.0–34.0)
MCHC: 32.2 g/dL (ref 30.0–36.0)
MCV: 104.2 fL — ABNORMAL HIGH (ref 80.0–100.0)
Monocytes Absolute: 1.2 10*3/uL — ABNORMAL HIGH (ref 0.1–1.0)
Monocytes Relative: 20 %
Neutro Abs: 3.6 10*3/uL (ref 1.7–7.7)
Neutrophils Relative %: 61 %
Platelets: 146 10*3/uL — ABNORMAL LOW (ref 150–400)
RBC: 3.13 MIL/uL — ABNORMAL LOW (ref 3.87–5.11)
RDW: 14.4 % (ref 11.5–15.5)
WBC: 5.9 10*3/uL (ref 4.0–10.5)
nRBC: 0 % (ref 0.0–0.2)

## 2021-07-26 LAB — COMPREHENSIVE METABOLIC PANEL
ALT: 10 U/L (ref 0–44)
AST: 17 U/L (ref 15–41)
Albumin: 2.6 g/dL — ABNORMAL LOW (ref 3.5–5.0)
Alkaline Phosphatase: 25 U/L — ABNORMAL LOW (ref 38–126)
Anion gap: 6 (ref 5–15)
BUN: 24 mg/dL — ABNORMAL HIGH (ref 8–23)
CO2: 27 mmol/L (ref 22–32)
Calcium: 8 mg/dL — ABNORMAL LOW (ref 8.9–10.3)
Chloride: 105 mmol/L (ref 98–111)
Creatinine, Ser: 1.27 mg/dL — ABNORMAL HIGH (ref 0.44–1.00)
GFR, Estimated: 40 mL/min — ABNORMAL LOW (ref >60)
Glucose, Bld: 95 mg/dL (ref 70–99)
Potassium: 3.9 mmol/L (ref 3.5–5.1)
Sodium: 138 mmol/L (ref 135–145)
Total Bilirubin: 0.8 mg/dL (ref 0.3–1.2)
Total Protein: 5 g/dL — ABNORMAL LOW (ref 6.5–8.1)

## 2021-07-26 LAB — D-DIMER, QUANTITATIVE: D-Dimer, Quant: 3.4 ug/mL-FEU — ABNORMAL HIGH (ref 0.00–0.50)

## 2021-07-26 LAB — FERRITIN: Ferritin: 105 ng/mL (ref 11–307)

## 2021-07-26 LAB — BRAIN NATRIURETIC PEPTIDE: B Natriuretic Peptide: 1063.6 pg/mL — ABNORMAL HIGH (ref 0.0–100.0)

## 2021-07-26 LAB — PROCALCITONIN: Procalcitonin: 0.25 ng/mL

## 2021-07-26 MED ORDER — FUROSEMIDE 10 MG/ML IJ SOLN
20.0000 mg | Freq: Once | INTRAMUSCULAR | Status: AC
  Administered 2021-07-26: 20 mg via INTRAVENOUS
  Filled 2021-07-26: qty 2

## 2021-07-26 MED ORDER — ACETAMINOPHEN 325 MG PO TABS
650.0000 mg | ORAL_TABLET | Freq: Four times a day (QID) | ORAL | Status: DC | PRN
  Administered 2021-07-26 – 2021-08-05 (×6): 650 mg via ORAL
  Filled 2021-07-26 (×7): qty 2

## 2021-07-26 MED ORDER — MOLNUPIRAVIR EUA 200MG CAPSULE
4.0000 | ORAL_CAPSULE | Freq: Two times a day (BID) | ORAL | Status: DC
  Filled 2021-07-26: qty 4

## 2021-07-26 MED ORDER — REMDESIVIR 100 MG IV SOLR
100.0000 mg | Freq: Every day | INTRAVENOUS | Status: AC
  Administered 2021-07-27 – 2021-07-30 (×4): 100 mg via INTRAVENOUS
  Filled 2021-07-26 (×4): qty 20

## 2021-07-26 MED ORDER — ALBUTEROL SULFATE (2.5 MG/3ML) 0.083% IN NEBU
2.5000 mg | INHALATION_SOLUTION | Freq: Once | RESPIRATORY_TRACT | Status: AC | PRN
  Administered 2021-07-26: 2.5 mg via RESPIRATORY_TRACT
  Filled 2021-07-26: qty 3

## 2021-07-26 MED ORDER — HYDRALAZINE HCL 20 MG/ML IJ SOLN
10.0000 mg | Freq: Four times a day (QID) | INTRAMUSCULAR | Status: DC | PRN
  Administered 2021-07-26 – 2021-08-03 (×9): 10 mg via INTRAVENOUS
  Filled 2021-07-26 (×9): qty 1

## 2021-07-26 MED ORDER — SODIUM CHLORIDE 0.9 % IV SOLN
200.0000 mg | Freq: Once | INTRAVENOUS | Status: AC
  Administered 2021-07-26: 200 mg via INTRAVENOUS
  Filled 2021-07-26: qty 40

## 2021-07-26 MED ORDER — ONDANSETRON HCL 4 MG/2ML IJ SOLN
4.0000 mg | Freq: Four times a day (QID) | INTRAMUSCULAR | Status: DC | PRN
  Administered 2021-07-30 – 2021-08-04 (×3): 4 mg via INTRAVENOUS
  Filled 2021-07-26 (×4): qty 2

## 2021-07-26 MED ORDER — METHYLPREDNISOLONE SODIUM SUCC 40 MG IJ SOLR
40.0000 mg | Freq: Two times a day (BID) | INTRAMUSCULAR | Status: DC
  Administered 2021-07-26 – 2021-07-28 (×5): 40 mg via INTRAVENOUS
  Filled 2021-07-26 (×5): qty 1

## 2021-07-26 MED ORDER — ALBUTEROL SULFATE HFA 108 (90 BASE) MCG/ACT IN AERS: 2.0000 | INHALATION_SPRAY | RESPIRATORY_TRACT | Status: DC | PRN

## 2021-07-26 MED ORDER — ALBUTEROL SULFATE HFA 108 (90 BASE) MCG/ACT IN AERS
2.0000 | INHALATION_SPRAY | RESPIRATORY_TRACT | Status: DC | PRN
  Administered 2021-07-26 – 2021-07-27 (×3): 2 via RESPIRATORY_TRACT
  Filled 2021-07-26 (×2): qty 6.7

## 2021-07-26 NOTE — Significant Event (Addendum)
Rapid Response Event Note  ? ?Reason for Call :  ?Acute respiratory distressed. Increased oxygen requirement from 2L [place] to 15L HFNC.  ? ?Initial Focused Assessment:  ?Pt lying in bed, alert. Purse lip breathing. Prolonged expiratory wheeze present. Mild distress and increased work of breathing. Pt is also concerned about having received her Prednisone and persistently asks staff if she received it. She also has been complaining of nausea, although has had no emesis. Skin is warm, dry, pink.  ? ?VS: T 97.21F, BP 194/84, HR 100, RR 26, SpO2 94% on 15L HFNC ? ?Interventions:  ?-CXR ?-Albuterol neb ?-Pt pulled up in bed, HOB elevated > 30 degrees  ? ?Plan of Care:  ?- Wean oxygen as pt tolerates ?-Encourage pulmonary hygiene- incentive spirometer, flutter valve, turning and repositioning, OOB if tolerable, coughing and deep breathing ?-HOB > 30 degrees ? ?Call rapid response for additional needs ? ?Event Summary:  ?MD Notified: Dr. Kurtis Bushman- provider to bedside to evaluate pt ?Call Time: 1122 ?Arrival Time: 9432 ?End Time: 1215 ? ?Casimer Bilis, RN ?

## 2021-07-26 NOTE — Progress Notes (Addendum)
?PROGRESS NOTE ? ? ? ?[name]  TDD:[id number] DOB: 10-14-1929 DOA: 07/25/2021 ?PCP: Mayra Neer, MD  ? ? ?Brief Narrative:  ?[name] is a 88 y.o. female with medical history significant of rheumatoid arthritis on chronic prednisone, remote history of lung cancer, COPD and CAD who presents with concerns of increasing shortness of breath. ?  ?Patient is a limited historian due to hearing impairment.  She is coming from an independent living facility and about to 3 days ago developed increasing shortness of breath with intermittent cough.  Denies any chest pain.  No nausea, vomiting or diarrhea. ?  ?In the ED, she was febrile up to 103F, hypotensive with BP of 90 over 40s, tachypneic with RR of 27 but stable on room air.  No leukocytosis with hemoglobin of 11.9.  Creatinine is mildly elevated 1.29 with otherwise normal electrolytes. ?  ?Chest x-ray is negative. ?Patient initially treated with IV Rocephin and doxycycline for presumed pneumonia.  However, COVID PCR later returned positive. ? ?4/10 patient is short of breath this morning.  Now requiring 15 high flow nasal cannula.  Spoke to daughter who is POA Programmer, systems who reports last night told nursing patient is DNR.  This morning patient was full code but per daughter who is POA wants patient to be DNR after discussion. Pt is purse breathing. ? ?Consultants:  ? ? ?Procedures:  ? ?Antimicrobials:  ?  ? ? ?Subjective: ?C/o sob. No cp ? ?Objective: ?Vitals:  ? 07/26/21 0829 07/26/21 0930 07/26/21 1056 07/26/21 1114  ?BP: (!) 147/31     ?Pulse: 61     ?Resp: 17     ?Temp: 98.2 ?F (36.8 ?C)     ?TempSrc: Oral     ?SpO2: 94% 92% 96% (!) 76%  ?Weight:      ?Height:      ? ? ?Intake/Output Summary (Last 24 hours) at 07/26/2021 1205 ?Last data filed at 07/26/2021 0600 ?Gross per 24 hour  ?Intake 1568.07 ml  ?Output 0 ml  ?Net 1568.07 ml  ? ?Filed Weights  ? 07/25/21 1815  ?Weight: 46.3 kg  ? ? ?Examination: ?Anxious, purse breathing ?Rhonchorous bilaterally, and  expiratory wheeze ?Reg s1/s2 no gallop ?Soft benign +bs ?No edema ?Grossly intact ?Mood and affect appropriate in current setting  ? ? ? ?Data Reviewed: I have personally reviewed following labs and imaging studies ? ?CBC: ?Recent Labs  ?Lab 07/25/21 ?1820 07/26/21 ?0455  ?WBC 8.3 5.9  ?NEUTROABS 5.5 3.6  ?HGB 11.9* 10.5*  ?HCT 38.1 32.6*  ?MCV 104.1* 104.2*  ?PLT 159 146*  ? ?Basic Metabolic Panel: ?Recent Labs  ?Lab 07/25/21 ?1820 07/26/21 ?0455  ?NA 140 138  ?K 4.3 3.9  ?CL 103 105  ?CO2 28 27  ?GLUCOSE 109* 95  ?BUN 26* 24*  ?CREATININE 1.29* 1.27*  ?CALCIUM 8.8* 8.0*  ? ?GFR: ?Estimated Creatinine Clearance: 19.1 mL/min (A) (by C-G formula based on SCr of 1.27 mg/dL (H)). ?Liver Function Tests: ?Recent Labs  ?Lab 07/25/21 ?1820 07/26/21 ?0455  ?AST 21 17  ?ALT 11 10  ?ALKPHOS 36* 25*  ?BILITOT 0.5 0.8  ?PROT 6.1* 5.0*  ?ALBUMIN 3.4* 2.6*  ? ?No results for input(s): LIPASE, AMYLASE in the last 168 hours. ?No results for input(s): AMMONIA in the last 168 hours. ?Coagulation Profile: ?Recent Labs  ?Lab 07/25/21 ?1820  ?INR 1.0  ? ?Cardiac Enzymes: ?No results for input(s): CKTOTAL, CKMB, CKMBINDEX, TROPONINI in the last 168 hours. ?BNP (last 3 results) ?No results for input(s): PROBNP in the  last 8760 hours. ?HbA1C: ?No results for input(s): HGBA1C in the last 72 hours. ?CBG: ?No results for input(s): GLUCAP in the last 168 hours. ?Lipid Profile: ?No results for input(s): CHOL, HDL, LDLCALC, TRIG, CHOLHDL, LDLDIRECT in the last 72 hours. ?Thyroid Function Tests: ?No results for input(s): TSH, T4TOTAL, FREET4, T3FREE, THYROIDAB in the last 72 hours. ?Anemia Panel: ?No results for input(s): VITAMINB12, FOLATE, FERRITIN, TIBC, IRON, RETICCTPCT in the last 72 hours. ?Sepsis Labs: ?Recent Labs  ?Lab 07/25/21 ?1820 07/25/21 ?2210  ?LATICACIDVEN 2.0* 1.6  ? ? ?Recent Results (from the past 240 hour(s))  ?Resp Panel by RT-PCR (Flu A&B, Covid) Nasopharyngeal Swab     Status: Abnormal  ? Collection Time: 07/25/21  8:04 PM   ? Specimen: Nasopharyngeal Swab; Nasopharyngeal(NP) swabs in vial transport medium  ?Result Value Ref Range Status  ? SARS Coronavirus 2 by RT PCR POSITIVE (A) NEGATIVE Final  ?  Comment: (NOTE) ?SARS-CoV-2 target nucleic acids are DETECTED. ? ?The SARS-CoV-2 RNA is generally detectable in upper respiratory ?specimens during the acute phase of infection. Positive results are ?indicative of the presence of the identified virus, but do not rule ?out bacterial infection or co-infection with other pathogens not ?detected by the test. Clinical correlation with patient history and ?other diagnostic information is necessary to determine patient ?infection status. The expected result is Negative. ? ?Fact Sheet for Patients: ?EntrepreneurPulse.com.au ? ?Fact Sheet for Healthcare Providers: ?IncredibleEmployment.be ? ?This test is not yet approved or cleared by the [place] FDA and  ?has been authorized for detection and/or diagnosis of SARS-CoV-2 by ?FDA under an Emergency Use Authorization (EUA).  This EUA will ?remain in effect (meaning this test can be used) for the duration of  ?the COVID-19 declaration under Section 564(b)(1) of the A ct, 21 ?U.S.C. section 360bbb-3(b)(1), unless the authorization is ?terminated or revoked sooner. ? ?  ? Influenza A by PCR NEGATIVE NEGATIVE Final  ? Influenza B by PCR NEGATIVE NEGATIVE Final  ?  Comment: (NOTE) ?The Xpert Xpress SARS-CoV-2/FLU/RSV plus assay is intended as an aid ?in the diagnosis of influenza from Nasopharyngeal swab specimens and ?should not be used as a sole basis for treatment. Nasal washings and ?aspirates are unacceptable for Xpert Xpress SARS-CoV-2/FLU/RSV ?testing. ? ?Fact Sheet for Patients: ?EntrepreneurPulse.com.au ? ?Fact Sheet for Healthcare Providers: ?IncredibleEmployment.be ? ?This test is not yet approved or cleared by the [place] FDA and ?has been authorized for detection  and/or diagnosis of SARS-CoV-2 by ?FDA under an Emergency Use Authorization (EUA). This EUA will remain ?in effect (meaning this test can be used) for the duration of the ?COVID-19 declaration under Section 564(b)(1) of the Act, 21 U.S.C. ?section 360bbb-3(b)(1), unless the authorization is terminated or ?revoked. ? ?Performed at [facility], [place] [address], [place], [place] ?[address] ?  ?  ? ? ? ? ? ?Radiology Studies: ?DG Chest Portable 1 View ? ?Result Date: 07/25/2021 ?CLINICAL DATA:  Shortness of breath and fever EXAM: PORTABLE CHEST 1 VIEW COMPARISON:  08/25/2017, CT 03/27/2020 FINDINGS: Similar post treatment changes of the right chest with volume loss and right upper lobe parenchymal scarring. Retraction of right hilus as before. No acute airspace disease. Stable cardiac size. IMPRESSION: No active disease.  Similar post treatment changes on the right Electronically Signed   By: Donavan Foil M.D.   On: 07/25/2021 19:05   ? ? ? ? ? ?Scheduled Meds: ? enoxaparin (LOVENOX) injection  30 mg Subcutaneous Daily  ? furosemide  20 mg Intravenous  Once  ? methylPREDNISolone (SOLU-MEDROL) injection  40 mg Intravenous Q12H  ? ?Continuous Infusions: ? remdesivir 200 mg in sodium chloride 0.9% 250 mL IVPB    ? Followed by  ? [START ON 07/27/2021] remdesivir 100 mg in NS 100 mL    ? ? ?Assessment & Plan: ?  ?Principal Problem: ?  COVID-19 virus infection ?Active Problems: ?  Sepsis ([employer]) ?  Rheumatoid arthritis-severe ?  AKI (acute kidney injury) ([place]) ?  Hypotension ? ? ?Sepsis ([place]) ?Secondary to COVID-19 viral infection.  Presented with fever, and tachypnea with positive COVID PCR. ?-Initially was given antibiotic coverage for presumed pneumonia.  Will discontinue at this time as her chest x-ray was negative and suspect symptoms are all due to COVID. ?4/10 ck procalcitonin ?Dc ivf as LA improved , with covid avoid volume overload. ?Hold bp meds as she presented with hypotention initially ? ?Covid 19  infection ?Change plaxoid to IV remdesivir as pt more sob and requiring HFNC now ?Dc po steroids ?Start iv steroids ?Ck d dimer ?Cxr ?Bnp, ferritin, procal. ?Dc ivf ?Lasix 20mg  iv x1 ? ? ?COPD ?With acute exacerbation .

## 2021-07-27 DIAGNOSIS — U071 COVID-19: Secondary | ICD-10-CM | POA: Diagnosis not present

## 2021-07-27 LAB — CBC WITH DIFFERENTIAL/PLATELET
Abs Immature Granulocytes: 0.02 10*3/uL (ref 0.00–0.07)
Basophils Absolute: 0 10*3/uL (ref 0.0–0.1)
Basophils Relative: 0 %
Eosinophils Absolute: 0 10*3/uL (ref 0.0–0.5)
Eosinophils Relative: 0 %
HCT: 37.4 % (ref 36.0–46.0)
Hemoglobin: 12 g/dL (ref 12.0–15.0)
Immature Granulocytes: 1 %
Lymphocytes Relative: 10 %
Lymphs Abs: 0.4 10*3/uL — ABNORMAL LOW (ref 0.7–4.0)
MCH: 32.5 pg (ref 26.0–34.0)
MCHC: 32.1 g/dL (ref 30.0–36.0)
MCV: 101.4 fL — ABNORMAL HIGH (ref 80.0–100.0)
Monocytes Absolute: 0.2 10*3/uL (ref 0.1–1.0)
Monocytes Relative: 6 %
Neutro Abs: 3.2 10*3/uL (ref 1.7–7.7)
Neutrophils Relative %: 83 %
Platelets: 121 10*3/uL — ABNORMAL LOW (ref 150–400)
RBC: 3.69 MIL/uL — ABNORMAL LOW (ref 3.87–5.11)
RDW: 13.9 % (ref 11.5–15.5)
WBC: 3.9 10*3/uL — ABNORMAL LOW (ref 4.0–10.5)
nRBC: 0 % (ref 0.0–0.2)

## 2021-07-27 LAB — COMPREHENSIVE METABOLIC PANEL
ALT: 17 U/L (ref 0–44)
AST: 29 U/L (ref 15–41)
Albumin: 2.8 g/dL — ABNORMAL LOW (ref 3.5–5.0)
Alkaline Phosphatase: 31 U/L — ABNORMAL LOW (ref 38–126)
Anion gap: 15 (ref 5–15)
BUN: 30 mg/dL — ABNORMAL HIGH (ref 8–23)
CO2: 21 mmol/L — ABNORMAL LOW (ref 22–32)
Calcium: 8.1 mg/dL — ABNORMAL LOW (ref 8.9–10.3)
Chloride: 104 mmol/L (ref 98–111)
Creatinine, Ser: 1.39 mg/dL — ABNORMAL HIGH (ref 0.44–1.00)
GFR, Estimated: 36 mL/min — ABNORMAL LOW (ref >60)
Glucose, Bld: 146 mg/dL — ABNORMAL HIGH (ref 70–99)
Potassium: 3.8 mmol/L (ref 3.5–5.1)
Sodium: 140 mmol/L (ref 135–145)
Total Bilirubin: 1.2 mg/dL (ref 0.3–1.2)
Total Protein: 5.3 g/dL — ABNORMAL LOW (ref 6.5–8.1)

## 2021-07-27 LAB — C DIFFICILE QUICK SCREEN W PCR REFLEX
C Diff antigen: NEGATIVE
C Diff interpretation: NOT DETECTED
C Diff toxin: NEGATIVE

## 2021-07-27 LAB — PROCALCITONIN: Procalcitonin: 7.67 ng/mL

## 2021-07-27 MED ORDER — IPRATROPIUM-ALBUTEROL 20-100 MCG/ACT IN AERS
1.0000 | INHALATION_SPRAY | Freq: Four times a day (QID) | RESPIRATORY_TRACT | Status: DC
  Administered 2021-07-27 – 2021-07-28 (×4): 1 via RESPIRATORY_TRACT
  Filled 2021-07-27: qty 4

## 2021-07-27 MED ORDER — DM-GUAIFENESIN ER 30-600 MG PO TB12
1.0000 | ORAL_TABLET | Freq: Two times a day (BID) | ORAL | Status: DC | PRN
  Administered 2021-08-02 – 2021-08-04 (×2): 1 via ORAL
  Filled 2021-07-27 (×3): qty 1

## 2021-07-27 MED ORDER — SODIUM CHLORIDE 0.9 % IV SOLN
500.0000 mg | INTRAVENOUS | Status: AC
  Administered 2021-07-27 – 2021-07-31 (×5): 500 mg via INTRAVENOUS
  Filled 2021-07-27 (×5): qty 5

## 2021-07-27 MED ORDER — MOMETASONE FURO-FORMOTEROL FUM 100-5 MCG/ACT IN AERO
2.0000 | INHALATION_SPRAY | Freq: Two times a day (BID) | RESPIRATORY_TRACT | Status: DC
  Administered 2021-07-27 – 2021-08-06 (×20): 2 via RESPIRATORY_TRACT
  Filled 2021-07-27: qty 8.8

## 2021-07-27 MED ORDER — SODIUM CHLORIDE 0.9 % IV SOLN
1.0000 g | INTRAVENOUS | Status: DC
  Administered 2021-07-27 – 2021-07-31 (×5): 1 g via INTRAVENOUS
  Filled 2021-07-27 (×5): qty 10

## 2021-07-27 MED ORDER — FUROSEMIDE 20 MG PO TABS
20.0000 mg | ORAL_TABLET | Freq: Two times a day (BID) | ORAL | Status: DC
  Administered 2021-07-27 (×2): 20 mg via ORAL
  Filled 2021-07-27 (×2): qty 1

## 2021-07-27 MED ORDER — SENNOSIDES-DOCUSATE SODIUM 8.6-50 MG PO TABS: 1.0000 | ORAL_TABLET | Freq: Every evening | ORAL | Status: DC | PRN

## 2021-07-27 NOTE — Progress Notes (Addendum)
?PROGRESS NOTE ? ? ? ?[name]  NIO:[id number] DOB: Dec 08, 1929 DOA: 07/25/2021 ?PCP: Mayra Neer, MD  ? ?Brief Narrative:  ?88 year old with history of remote lung cancer, chronic rheumatoid arthritis on prednisone, COPD, CAD admitted for shortness of breath ongoing for 3 days prior to the admission.  She was found to be COVID-positive.  CTA chest was negative for PE but showed possible recurrence of her lung malignancy and postobstructive changes.  Also had elevated procalcitonin.  Initially started on steroids and remdesivir.  Later antibiotics were added due to concerns of superimposed bacterial pneumonia and urinary tract infection. ? ? ?Assessment & Plan: ? Principal Problem: ?  COVID-19 virus infection ?Active Problems: ?  Sepsis ([place]) ?  Rheumatoid arthritis-severe ?  AKI (acute kidney injury) ([place]) ?  Hypotension ?  ? ? ?Assessment and Plan: ?Sepsis ([place]) ?-Secondary to COVID-19 infection, superimposed bacterial pneumonia and urinary tract infection. ?-Added Rocephin and azithromycin. ? ?COVID-19 infection ?Acute hypoxic respiratory distress ?-Patient currently is DNR.  Continue IV steroids, bronchodilators, I-S/flutter valve ?- CTA is negative for PE. ?-Procalcitonin elevated-empiric antibiotic ?- BNP elevated-gentle diuretics Lasix 20 mg p.o. twice daily X3 doses ? ?Hypotension ?-Hold home antihypertensives ? ?AKI (acute kidney injury) ([place]) ?-Previous baseline was around 1.1.  Continue to closely monitor creatinine ? ?Rheumatoid arthritis-severe ?Continue home chronic 10 mg prednisone which is currently on hold ? ? ?DVT prophylaxis: Lovenox ?Code Status: DNR ?Family Communication:  [name] Updated.  ? ?Status is: Inpatient ?Remains inpatient appropriate because: Still requiring O2 for severe covid. Maintain hosp stay ? ? ?Nutritional status ? ? ? ?  ? ?  ? ?Body mass index is 22.87 kg/m?. ? ?  ? ? ? ? ? ?Subjective: ?Sitting up on the commode. States at rest she feels ok but withmovement she gets  sob.  ? ? ?Examination: ? ?Constitutional: Not in acute distress 4LHFNC; frail ?Respiratory: b/l rhonchi ?Cardiovascular: Normal sinus rhythm, no rubs ?Abdomen: Nontender nondistended good bowel sounds ?Musculoskeletal: No edema noted ?Skin: No rashes seen ?Neurologic: CN 2-12 grossly intact.  And nonfocal ?Psychiatric: Normal judgment and insight. Alert and oriented x 3. Normal mood.  ? ? ? ?Objective: ?Vitals:  ? 07/27/21 0005 07/27/21 0035 07/27/21 8182 07/27/21 9937  ?BP: (!) 185/64 (!) 110/38 100/71 (!) 169/83  ?Pulse: 70 69 74 82  ?Resp: 20 14 20 18   ?Temp: (!) 97.3 ?F (36.3 ?C)  97.6 ?F (36.4 ?C) 97.8 ?F (36.6 ?C)  ?TempSrc: Oral  Oral Oral  ?SpO2: 99% 97% 98% 97%  ?Weight:      ?Height:      ? ? ?Intake/Output Summary (Last 24 hours) at 07/27/2021 0837 ?Last data filed at 07/27/2021 1696 ?Gross per 24 hour  ?Intake 360 ml  ?Output 400 ml  ?Net -40 ml  ? ?Filed Weights  ? 07/25/21 1815  ?Weight: 46.3 kg  ? ? ? ?Data Reviewed:  ? ?CBC: ?Recent Labs  ?Lab 07/25/21 ?1820 07/26/21 ?0455 07/27/21 ?0304  ?WBC 8.3 5.9 3.9*  ?NEUTROABS 5.5 3.6 3.2  ?HGB 11.9* 10.5* 12.0  ?HCT 38.1 32.6* 37.4  ?MCV 104.1* 104.2* 101.4*  ?PLT 159 146* 121*  ? ?Basic Metabolic Panel: ?Recent Labs  ?Lab 07/25/21 ?1820 07/26/21 ?0455 07/27/21 ?0304  ?NA 140 138 140  ?K 4.3 3.9 3.8  ?CL 103 105 104  ?CO2 28 27 21*  ?GLUCOSE 109* 95 146*  ?BUN 26* 24* 30*  ?CREATININE 1.29* 1.27* 1.39*  ?CALCIUM 8.8* 8.0* 8.1*  ? ?GFR: ?Estimated Creatinine Clearance: 17.5 mL/min (A) (  by C-G formula based on SCr of 1.39 mg/dL (H)). ?Liver Function Tests: ?Recent Labs  ?Lab 07/25/21 ?1820 07/26/21 ?0455 07/27/21 ?0304  ?AST 21 17 29   ?ALT 11 10 17   ?ALKPHOS 36* 25* 31*  ?BILITOT 0.5 0.8 1.2  ?PROT 6.1* 5.0* 5.3*  ?ALBUMIN 3.4* 2.6* 2.8*  ? ?No results for input(s): LIPASE, AMYLASE in the last 168 hours. ?No results for input(s): AMMONIA in the last 168 hours. ?Coagulation Profile: ?Recent Labs  ?Lab 07/25/21 ?1820  ?INR 1.0  ? ?Cardiac Enzymes: ?No results for  input(s): CKTOTAL, CKMB, CKMBINDEX, TROPONINI in the last 168 hours. ?BNP (last 3 results) ?No results for input(s): PROBNP in the last 8760 hours. ?HbA1C: ?No results for input(s): HGBA1C in the last 72 hours. ?CBG: ?No results for input(s): GLUCAP in the last 168 hours. ?Lipid Profile: ?No results for input(s): CHOL, HDL, LDLCALC, TRIG, CHOLHDL, LDLDIRECT in the last 72 hours. ?Thyroid Function Tests: ?No results for input(s): TSH, T4TOTAL, FREET4, T3FREE, THYROIDAB in the last 72 hours. ?Anemia Panel: ?Recent Labs  ?  07/26/21 ?1053  ?FERRITIN 105  ? ?Sepsis Labs: ?Recent Labs  ?Lab 07/25/21 ?1820 07/25/21 ?2210 07/26/21 ?1053 07/27/21 ?0304  ?PROCALCITON  --   --  0.25 7.67  ?LATICACIDVEN 2.0* 1.6  --   --   ? ? ?Recent Results (from the past 240 hour(s))  ?Culture, blood (Routine x 2)     Status: None (Preliminary result)  ? Collection Time: 07/25/21  6:45 PM  ? Specimen: BLOOD RIGHT FOREARM  ?Result Value Ref Range Status  ? Specimen Description BLOOD RIGHT FOREARM IV START  Final  ? Special Requests   Final  ?  BOTTLES DRAWN AEROBIC AND ANAEROBIC Blood Culture adequate volume  ? Culture   Final  ?  NO GROWTH < 24 HOURS ?Performed at [facility], [address] [place], [place] [address] ?  ? Report Status PENDING  Incomplete  ?Culture, blood (Routine x 2)     Status: None (Preliminary result)  ? Collection Time: 07/25/21  6:45 PM  ? Specimen: BLOOD LEFT FOREARM  ?Result Value Ref Range Status  ? Specimen Description BLOOD LEFT FOREARM  Final  ? Special Requests   Final  ?  BOTTLES DRAWN AEROBIC AND ANAEROBIC Blood Culture adequate volume  ? Culture   Final  ?  NO GROWTH < 24 HOURS ?Performed at [facility], [place] [address], [place], [place] [address] ?  ? Report Status PENDING  Incomplete  ?Resp Panel by RT-PCR (Flu A&B, Covid) Nasopharyngeal Swab     Status: Abnormal  ? Collection Time: 07/25/21  8:04 PM  ? Specimen: Nasopharyngeal Swab; Nasopharyngeal(NP) swabs in vial transport medium   ?Result Value Ref Range Status  ? SARS Coronavirus 2 by RT PCR POSITIVE (A) NEGATIVE Final  ?  Comment: (NOTE) ?SARS-CoV-2 target nucleic acids are DETECTED. ? ?The SARS-CoV-2 RNA is generally detectable in upper respiratory ?specimens during the acute phase of infection. Positive results are ?indicative of the presence of the identified virus, but do not rule ?out bacterial infection or co-infection with other pathogens not ?detected by the test. Clinical correlation with patient history and ?other diagnostic information is necessary to determine patient ?infection status. The expected result is Negative. ? ?Fact Sheet for Patients: ?EntrepreneurPulse.com.au ? ?Fact Sheet for Healthcare Providers: ?IncredibleEmployment.be ? ?This test is not yet approved or cleared by the [place] FDA and  ?has been authorized for detection and/or diagnosis of SARS-CoV-2 by ?FDA  under an Emergency Use Authorization (EUA).  This EUA will ?remain in effect (meaning this test can be used) for the duration of  ?the COVID-19 declaration under Section 564(b)(1) of the A ct, 21 ?U.S.C. section 360bbb-3(b)(1), unless the authorization is ?terminated or revoked sooner. ? ?  ? Influenza A by PCR NEGATIVE NEGATIVE Final  ? Influenza B by PCR NEGATIVE NEGATIVE Final  ?  Comment: (NOTE) ?The Xpert Xpress SARS-CoV-2/FLU/RSV plus assay is intended as an aid ?in the diagnosis of influenza from Nasopharyngeal swab specimens and ?should not be used as a sole basis for treatment. Nasal washings and ?aspirates are unacceptable for Xpert Xpress SARS-CoV-2/FLU/RSV ?testing. ? ?Fact Sheet for Patients: ?EntrepreneurPulse.com.au ? ?Fact Sheet for Healthcare Providers: ?IncredibleEmployment.be ? ?This test is not yet approved or cleared by the [place] FDA and ?has been authorized for detection and/or diagnosis of SARS-CoV-2 by ?FDA under an Emergency Use Authorization (EUA).  This EUA will remain ?in effect (meaning this test can be used) for the duration of the ?COVID-19 declaration under Section 564(b)(1) of the Act, 21 U.S.C. ?section 360bbb-3(b)(1), unless the authorization is

## 2021-07-27 NOTE — Progress Notes (Signed)
?   07/27/21 1027  ?Assess: MEWS Score  ?BP (!) 175/68  ?Pulse Rate 80  ?ECG Heart Rate 82  ?Resp (!) 28  ?SpO2 97 %  ?O2 Device HFNC  ?O2 Flow Rate (L/min) 2 L/min  ?Assess: MEWS Score  ?MEWS Temp 0  ?MEWS Systolic 0  ?MEWS Pulse 0  ?MEWS RR 2  ?MEWS LOC 0  ?MEWS Score 2  ?MEWS Score Color Yellow  ?Assess: if the MEWS score is Yellow or Red  ?Were vital signs taken at a resting state? Yes  ?Focused Assessment No change from prior assessment  ?Early Detection of Sepsis Score *See Row Information* Low  ?MEWS guidelines implemented *See Row Information* Yes  ?Take Vital Signs  ?Increase Vital Sign Frequency  Yellow: Q 2hr X 2 then Q 4hr X 2, if remains yellow, continue Q 4hrs  ?Escalate  ?MEWS: Escalate Yellow: discuss with charge nurse/RN and consider discussing with provider and RRT  ?Notify: Charge Nurse/RN  ?Name of Charge Nurse/RN Notified Cyril Mourning  ?Date Charge Nurse/RN Notified 07/27/21  ?Time Charge Nurse/RN Notified 1336  ?Document  ?Patient Outcome Stabilized after interventions  ?Progress note created (see row info) Yes  ? ? ?

## 2021-07-28 ENCOUNTER — Ambulatory Visit: Payer: Medicare Other | Admitting: Rheumatology

## 2021-07-28 DIAGNOSIS — Z85118 Personal history of other malignant neoplasm of bronchus and lung: Secondary | ICD-10-CM

## 2021-07-28 DIAGNOSIS — M19071 Primary osteoarthritis, right ankle and foot: Secondary | ICD-10-CM

## 2021-07-28 DIAGNOSIS — M0609 Rheumatoid arthritis without rheumatoid factor, multiple sites: Secondary | ICD-10-CM

## 2021-07-28 DIAGNOSIS — M19041 Primary osteoarthritis, right hand: Secondary | ICD-10-CM

## 2021-07-28 DIAGNOSIS — M810 Age-related osteoporosis without current pathological fracture: Secondary | ICD-10-CM

## 2021-07-28 DIAGNOSIS — M0630 Rheumatoid nodule, unspecified site: Secondary | ICD-10-CM

## 2021-07-28 DIAGNOSIS — I700 Atherosclerosis of aorta: Secondary | ICD-10-CM

## 2021-07-28 DIAGNOSIS — Z8709 Personal history of other diseases of the respiratory system: Secondary | ICD-10-CM

## 2021-07-28 DIAGNOSIS — U071 COVID-19: Secondary | ICD-10-CM | POA: Diagnosis not present

## 2021-07-28 DIAGNOSIS — I214 Non-ST elevation (NSTEMI) myocardial infarction: Secondary | ICD-10-CM

## 2021-07-28 DIAGNOSIS — Z79899 Other long term (current) drug therapy: Secondary | ICD-10-CM

## 2021-07-28 LAB — CBC WITH DIFFERENTIAL/PLATELET
Abs Immature Granulocytes: 0.03 10*3/uL (ref 0.00–0.07)
Basophils Absolute: 0 10*3/uL (ref 0.0–0.1)
Basophils Relative: 0 %
Eosinophils Absolute: 0 10*3/uL (ref 0.0–0.5)
Eosinophils Relative: 0 %
HCT: 34.1 % — ABNORMAL LOW (ref 36.0–46.0)
Hemoglobin: 11 g/dL — ABNORMAL LOW (ref 12.0–15.0)
Immature Granulocytes: 1 %
Lymphocytes Relative: 5 %
Lymphs Abs: 0.3 10*3/uL — ABNORMAL LOW (ref 0.7–4.0)
MCH: 32.2 pg (ref 26.0–34.0)
MCHC: 32.3 g/dL (ref 30.0–36.0)
MCV: 99.7 fL (ref 80.0–100.0)
Monocytes Absolute: 0.2 10*3/uL (ref 0.1–1.0)
Monocytes Relative: 3 %
Neutro Abs: 5.7 10*3/uL (ref 1.7–7.7)
Neutrophils Relative %: 91 %
Platelets: 140 10*3/uL — ABNORMAL LOW (ref 150–400)
RBC: 3.42 MIL/uL — ABNORMAL LOW (ref 3.87–5.11)
RDW: 14.1 % (ref 11.5–15.5)
WBC: 6.2 10*3/uL (ref 4.0–10.5)
nRBC: 0 % (ref 0.0–0.2)

## 2021-07-28 LAB — COMPREHENSIVE METABOLIC PANEL
ALT: 19 U/L (ref 0–44)
AST: 31 U/L (ref 15–41)
Albumin: 2.8 g/dL — ABNORMAL LOW (ref 3.5–5.0)
Alkaline Phosphatase: 28 U/L — ABNORMAL LOW (ref 38–126)
Anion gap: 8 (ref 5–15)
BUN: 45 mg/dL — ABNORMAL HIGH (ref 8–23)
CO2: 27 mmol/L (ref 22–32)
Calcium: 7.8 mg/dL — ABNORMAL LOW (ref 8.9–10.3)
Chloride: 106 mmol/L (ref 98–111)
Creatinine, Ser: 1.42 mg/dL — ABNORMAL HIGH (ref 0.44–1.00)
GFR, Estimated: 35 mL/min — ABNORMAL LOW (ref >60)
Glucose, Bld: 192 mg/dL — ABNORMAL HIGH (ref 70–99)
Potassium: 3.4 mmol/L — ABNORMAL LOW (ref 3.5–5.1)
Sodium: 141 mmol/L (ref 135–145)
Total Bilirubin: 0.6 mg/dL (ref 0.3–1.2)
Total Protein: 5.2 g/dL — ABNORMAL LOW (ref 6.5–8.1)

## 2021-07-28 LAB — PROCALCITONIN: Procalcitonin: 8.04 ng/mL

## 2021-07-28 LAB — MAGNESIUM: Magnesium: 2.1 mg/dL (ref 1.7–2.4)

## 2021-07-28 LAB — C-REACTIVE PROTEIN: CRP: 6.5 mg/dL — ABNORMAL HIGH (ref <1.0)

## 2021-07-28 MED ORDER — PREDNISONE 20 MG PO TABS
40.0000 mg | ORAL_TABLET | Freq: Every day | ORAL | Status: DC
  Administered 2021-07-29 – 2021-07-30 (×2): 40 mg via ORAL
  Filled 2021-07-28 (×2): qty 2

## 2021-07-28 MED ORDER — POTASSIUM CHLORIDE 20 MEQ PO PACK
40.0000 meq | PACK | Freq: Once | ORAL | Status: AC
Start: 2021-07-28 — End: 2021-07-28
  Administered 2021-07-28: 40 meq via ORAL
  Filled 2021-07-28: qty 2

## 2021-07-28 MED ORDER — UMECLIDINIUM-VILANTEROL 62.5-25 MCG/ACT IN AEPB: 1.0000 | INHALATION_SPRAY | Freq: Every day | RESPIRATORY_TRACT | Status: DC | PRN

## 2021-07-28 MED ORDER — IPRATROPIUM-ALBUTEROL 20-100 MCG/ACT IN AERS
1.0000 | INHALATION_SPRAY | Freq: Two times a day (BID) | RESPIRATORY_TRACT | Status: DC
  Administered 2021-07-28 – 2021-08-06 (×17): 1 via RESPIRATORY_TRACT

## 2021-07-28 NOTE — Evaluation (Signed)
Physical Therapy Evaluation ?Patient Details ?Name: [name] ?MRN: [id number] ?DOB: November 11, 1929 ?Today's Date: 07/28/2021 ? ?History of Present Illness ? Pt is a 88 yr old female who presented 4/8 from ILF  due to SOB. Pt found to be COVID +, sepsis due to bacterial pneumonia and UTI. PMH: RA, lung ca, COPD, CAD, HOH, hernia, R hip pain  ?Clinical Impression ?  ?Pt admitted secondary to problem above with deficits below. PTA patient was living alone in ILF with aide recently coming in to assist with ADLs. She walks with rollator. Pt currently requires min assist overall for all mobility and demonstrates decr safety awareness that is complicated by her incr anxiety and HOH. When she slows down enough to listen to and hears instructions, she is able to follow commands. Unfortunately, she often is trying to do things her way with no regard for lines and leads and demonstrating poor safety awareness (ex. Pulling on RW to stand with RW tipping backwards as she unsuccessfully tries to stand). Patient currently needs 24/7 care and unless this can be provided at ILF she will need SNF. Anticipate patient will benefit from PT to address problems listed below.Will continue to follow acutely to maximize functional mobility independence and safety.   ?   ?   ? ?Recommendations for follow up therapy are one component of a multi-disciplinary discharge planning process, led by the attending physician.  Recommendations may be updated based on patient status, additional functional criteria and insurance authorization. ? ?Follow Up Recommendations Skilled nursing-short term rehab (<3 hours/day) (unless 24/7 aide can be arranged at [place]) ? ?  ?Assistance Recommended at Discharge Frequent or constant Supervision/Assistance  ?Patient can return home with the following ? A little help with walking and/or transfers;Assistance with cooking/housework ? ?  ?Equipment Recommendations None recommended by PT  ?Recommendations for Other  Services ?    ?  ?Functional Status Assessment Patient has had a recent decline in their functional status and demonstrates the ability to make significant improvements in function in a reasonable and predictable amount of time.  ? ?  ?Precautions / Restrictions Precautions ?Precautions: Fall (very anxious, does not want to stay in chair as feeling like they can not move. HOH) ?Restrictions ?Weight Bearing Restrictions: No  ? ?  ? ?Mobility ? Bed Mobility ?  ?  ?  ?  ?  ?  ?  ?General bed mobility comments: in recliner on arrival and departure ?  ? ?Transfers ?Overall transfer level: Needs assistance ?Equipment used: Rolling walker (2 wheels), None ?Transfers: Sit to/from Stand, Bed to chair/wheelchair/BSC ?Sit to Stand: Min assist ?  ?Step pivot transfers: Min assist ?  ?  ?  ?General transfer comment: from chair pt insists on pulling up on RW and requires multiple attempts and min boosting assist to achieve standing; without RW pivotal steps to [facility] with min assist for stability and pt prevent pt from sitting too soon (not close enough to chair) ?  ? ?Ambulation/Gait ?Ambulation/Gait assistance: Min assist ?Gait Distance (Feet): 45 Feet ?Assistive device: Rolling walker (2 wheels) ?Gait Pattern/deviations: Step-through pattern, Decreased stride length, Shuffle, Wide base of support ?Gait velocity: decr ?  ?  ?General Gait Details: pt prefers to wear left slipper due to callouses on bottom of foot; required assist maneuvering/turning RW (is used to a rollator) ? ?Stairs ?  ?  ?  ?  ?  ? ?Wheelchair Mobility ?  ? ?Modified Rankin (Stroke Patients Only) ?  ? ?  ? ?Balance  Overall balance assessment: Needs assistance ?Sitting-balance support: Single extremity supported, Feet supported ?Sitting balance-Leahy Scale: Fair ?  ?  ?Standing balance support: Bilateral upper extremity supported, Reliant on assistive device for balance ?Standing balance-Leahy Scale: Poor ?  ?  ?  ?  ?  ?  ?  ?  ?  ?  ?  ?  ?   ? ? ? ?Pertinent  Vitals/Pain Pain Assessment ?Pain Assessment: No/denies pain ?Breathing: normal ?Negative Vocalization: none ?Facial Expression: smiling or inexpressive ?Body Language: relaxed ?Consolability: no need to console ?PAINAD Score: 0  ? ? ?Home Living Family/patient expects to be discharged to:: Private residence ?Living Arrangements: Alone ?Available Help at Discharge: Family ?Type of Home: Independent living facility ?Home Access: Elevator ?  ?  ?  ?Home Layout: One level ?Home Equipment: Rollator (4 wheels);Grab bars - toilet;BSC/3in1 ?Additional Comments: Pt reports they make meals in home and daughter will take them to md visits and shopping as needed  ?  ?Prior Function Prior Level of Function : Independent/Modified Independent ?  ?  ?  ?  ?  ?  ?Mobility Comments: used 0DT, reported some near falls in th bathroom so stopped showering ?ADLs Comments: Per pt prior to this admission they were having a personal aide assisting them but they believe this is why they are sick ?  ? ? ?Hand Dominance  ? Dominant Hand: Right ? ?  ?Extremity/Trunk Assessment  ? Upper Extremity Assessment ?Upper Extremity Assessment: Defer to OT evaluation ?RUE Deficits / Details: Noted in BUE limited AROM due to RA but pt noted to be more limited in RUE compares to LUE for FM ?RUE Coordination: decreased fine motor;decreased gross motor ?LUE Deficits / Details: boney end feel with limitations due to RA ?LUE Coordination: decreased fine motor ?  ? ?Lower Extremity Assessment ?Lower Extremity Assessment: Generalized weakness (arthritic changes bil feet) ?  ? ?Cervical / Trunk Assessment ?Cervical / Trunk Assessment: Kyphotic  ?Communication  ? Communication: HOH  ?Cognition Arousal/Alertness: Awake/alert ?Behavior During Therapy: Anxious ?Overall Cognitive Status: Difficult to assess ?  ?  ?  ?  ?  ?  ?  ?  ?  ?  ?  ?  ?  ?  ?  ?  ?General Comments: Noted decrease in saftey awareness in session but pt is also [facility] with understanding all cues  given ?  ?  ? ?  ?General Comments General comments (skin integrity, edema, etc.): pt very anxious and very HOH making it difficult to get her to hear/listen to instructions to improve safety ? ?  ?Exercises    ? ?Assessment/Plan  ?  ?PT Assessment Patient needs continued PT services  ?PT Problem List Decreased strength;Decreased balance;Decreased mobility;Decreased cognition;Decreased knowledge of use of DME;Decreased safety awareness;Cardiopulmonary status limiting activity ? ?   ?  ?PT Treatment Interventions DME instruction;Gait training;Functional mobility training;Therapeutic activities;Therapeutic exercise;Balance training;Patient/family education   ? ?PT Goals (Current goals can be found in the Care Plan section)  ?Acute Rehab PT Goals ?Patient Stated Goal: wants to be independent ?PT Goal Formulation: With patient ?Time For Goal Achievement: 08/11/21 ?Potential to Achieve Goals: Good ? ?  ?Frequency Min 3X/week (unless SNF confirmed) ?  ? ? ?Co-evaluation   ?  ?  ?  ?  ? ? ?  ?AM-PAC PT "6 Clicks" Mobility  ?Outcome Measure Help needed turning from your back to your side while in a flat bed without using bedrails?: A Little ?Help needed moving from lying on your  back to sitting on the side of a flat bed without using bedrails?: A Little ?Help needed moving to and from a bed to a chair (including a wheelchair)?: A Little ?Help needed standing up from a chair using your arms (e.g., wheelchair or bedside chair)?: A Little ?Help needed to walk in hospital room?: A Little ?Help needed climbing 3-5 steps with a railing? : A Little ?6 Click Score: 18 ? ?  ?End of Session Equipment Utilized During Treatment: Oxygen ?Activity Tolerance: Patient tolerated treatment well ?Patient left: in chair;with call bell/phone within reach;with chair alarm set;with nursing/sitter in room ?Nurse Communication: Mobility status;Other (comment) (anxious about being left alone) ?PT Visit Diagnosis: Unsteadiness on feet  (R26.81);Difficulty in walking, not elsewhere classified (R26.2) ?  ? ?Time: 8206-0156 ?PT Time Calculation (min) (ACUTE ONLY): 47 min ? ? ?Charges:   PT Evaluation ?$PT Eval Moderate Complexity: 1 Mod ?PT Treatments ?$Gait T

## 2021-07-28 NOTE — Progress Notes (Signed)
?  Transition of Care (TOC) Screening Note ? ? ?Patient Details  ?Name: [name] ?Date of Birth: [birth year] ? ? ?Transition of Care (TOC) CM/SW Contact:    ?Cyndi Bender, RN ?Phone Number: ?07/28/2021, 9:09 AM ? ? ? ?Transition of Care Department [facility]) has reviewed patient and no TOC needs have been identified at this time. We will continue to monitor patient advancement through interdisciplinary progression rounds. If new patient transition needs arise, please place a TOC consult. ? ? ?

## 2021-07-28 NOTE — Progress Notes (Signed)
?PROGRESS NOTE ? ? ? ?[name]  IWL:[id number] DOB: [birth year] DOA: 07/25/2021 ?PCP: Mayra Neer, MD  ? ?Brief Narrative:  ?88 year old with history of remote lung cancer, chronic rheumatoid arthritis on prednisone, COPD, CAD admitted for shortness of breath ongoing for 3 days prior to the admission.  She was found to be COVID-positive.  CTA chest was negative for PE but showed possible recurrence of her lung malignancy and postobstructive changes.  Also had elevated procalcitonin.  Initially started on steroids and remdesivir.  Later antibiotics were added due to concerns of superimposed bacterial pneumonia and urinary tract infection. ? ? ?Assessment & Plan: ? Principal Problem: ?  COVID-19 virus infection ?Active Problems: ?  Sepsis ([place]) ?  Rheumatoid arthritis-severe ?  AKI (acute kidney injury) ([place]) ?  Hypotension ?  ? ? ?Assessment and Plan: ?Sepsis ([place]) ?-Secondary to COVID-19 infection, superimposed bacterial pneumonia and urinary tract infection. ?-Cont Rocephin and azithromycin. Elevated ProCal.  ? ?COVID-19 infection ?Acute hypoxic respiratory distress ?-Patient currently is DNR.  Bronchodilators, I-S/flutter valve. Change Solumedrol to Prednisone.  ?- CTA is negative for PE. ?-Procalcitonin elevated-empiric antibiotic ?- Elevated BNP, once more dose of lasix today.  ? ?Hypotension ?-Hold home antihypertensives ? ?AKI (acute kidney injury) ([place]) ?-Previous baseline was around 1.1.  Continue to closely monitor creatinine ? ?Rheumatoid arthritis-severe ?Continue home chronic 10 mg prednisone which is currently on hold ? ? ?DVT prophylaxis: Lovenox ?Code Status: DNR ?Family Communication:  [name] called and updated.  ? ?Status is: Inpatient ?Remains inpatient appropriate because: Still requiring O2 for severe covid. Maintain hosp stay, hopefully SNF in the next day or so. PT/OT pending.  ? ?Subjective: ?Doing better in terms of breathing, still has off and on confusion.   ? ? ?Examination: ? ?Constitutional: Not in acute distress; 3l [place] ?Respiratory: some b/l rhonchi.  ?Cardiovascular: Normal sinus rhythm, no rubs ?Abdomen: Nontender nondistended good bowel sounds ?Musculoskeletal: No edema noted ?Skin: No rashes seen ?Neurologic: CN 2-12 grossly intact.  And nonfocal ?Psychiatric: Poor judgement and insight. Alert and Awake.  ? ? ?Objective: ?Vitals:  ? 07/28/21 0315 07/28/21 0400 07/28/21 0821 07/28/21 1020  ?BP: (!) 149/59 129/69 (!) 93/54   ?Pulse: 63 64 79 70  ?Resp: 16 18 19    ?Temp: 98.9 ?F (37.2 ?C)  98.5 ?F (36.9 ?C)   ?TempSrc: Oral  Oral   ?SpO2: 98% 98%  (S) 100%  ?Weight:      ?Height:      ? ? ?Intake/Output Summary (Last 24 hours) at 07/28/2021 1049 ?Last data filed at 07/28/2021 1016 ?Gross per 24 hour  ?Intake 1050 ml  ?Output --  ?Net 1050 ml  ? ?Filed Weights  ? 07/25/21 1815  ?Weight: 46.3 kg  ? ? ? ?Data Reviewed:  ? ?CBC: ?Recent Labs  ?Lab 07/25/21 ?1820 07/26/21 ?0455 07/27/21 ?0304 07/28/21 ?0258  ?WBC 8.3 5.9 3.9* 6.2  ?NEUTROABS 5.5 3.6 3.2 5.7  ?HGB 11.9* 10.5* 12.0 11.0*  ?HCT 38.1 32.6* 37.4 34.1*  ?MCV 104.1* 104.2* 101.4* 99.7  ?PLT 159 146* 121* 140*  ? ?Basic Metabolic Panel: ?Recent Labs  ?Lab 07/25/21 ?1820 07/26/21 ?0455 07/27/21 ?0304 07/28/21 ?0258  ?NA 140 138 140 141  ?K 4.3 3.9 3.8 3.4*  ?CL 103 105 104 106  ?CO2 28 27 21* 27  ?GLUCOSE 109* 95 146* 192*  ?BUN 26* 24* 30* 45*  ?CREATININE 1.29* 1.27* 1.39* 1.42*  ?CALCIUM 8.8* 8.0* 8.1* 7.8*  ?MG  --   --   --  2.1  ? ?  GFR: ?Estimated Creatinine Clearance: 17.1 mL/min (A) (by C-G formula based on SCr of 1.42 mg/dL (H)). ?Liver Function Tests: ?Recent Labs  ?Lab 07/25/21 ?1820 07/26/21 ?0455 07/27/21 ?0304 07/28/21 ?0258  ?AST 21 17 29 31   ?ALT 11 10 17 19   ?ALKPHOS 36* 25* 31* 28*  ?BILITOT 0.5 0.8 1.2 0.6  ?PROT 6.1* 5.0* 5.3* 5.2*  ?ALBUMIN 3.4* 2.6* 2.8* 2.8*  ? ?No results for input(s): LIPASE, AMYLASE in the last 168 hours. ?No results for input(s): AMMONIA in the last 168  hours. ?Coagulation Profile: ?Recent Labs  ?Lab 07/25/21 ?1820  ?INR 1.0  ? ?Cardiac Enzymes: ?No results for input(s): CKTOTAL, CKMB, CKMBINDEX, TROPONINI in the last 168 hours. ?BNP (last 3 results) ?No results for input(s): PROBNP in the last 8760 hours. ?HbA1C: ?No results for input(s): HGBA1C in the last 72 hours. ?CBG: ?No results for input(s): GLUCAP in the last 168 hours. ?Lipid Profile: ?No results for input(s): CHOL, HDL, LDLCALC, TRIG, CHOLHDL, LDLDIRECT in the last 72 hours. ?Thyroid Function Tests: ?No results for input(s): TSH, T4TOTAL, FREET4, T3FREE, THYROIDAB in the last 72 hours. ?Anemia Panel: ?Recent Labs  ?  07/26/21 ?1053  ?FERRITIN 105  ? ?Sepsis Labs: ?Recent Labs  ?Lab 07/25/21 ?1820 07/25/21 ?2210 07/26/21 ?1053 07/27/21 ?0304 07/28/21 ?0258  ?PROCALCITON  --   --  0.25 7.67 8.04  ?LATICACIDVEN 2.0* 1.6  --   --   --   ? ? ?Recent Results (from the past 240 hour(s))  ?Culture, blood (Routine x 2)     Status: None (Preliminary result)  ? Collection Time: 07/25/21  6:45 PM  ? Specimen: BLOOD RIGHT FOREARM  ?Result Value Ref Range Status  ? Specimen Description BLOOD RIGHT FOREARM IV START  Final  ? Special Requests   Final  ?  BOTTLES DRAWN AEROBIC AND ANAEROBIC Blood Culture adequate volume  ? Culture   Final  ?  NO GROWTH 2 DAYS ?Performed at [facility], [place] [address] [place], [place] [address] ?  ? Report Status PENDING  Incomplete  ?Culture, blood (Routine x 2)     Status: None (Preliminary result)  ? Collection Time: 07/25/21  6:45 PM  ? Specimen: BLOOD LEFT FOREARM  ?Result Value Ref Range Status  ? Specimen Description BLOOD LEFT FOREARM  Final  ? Special Requests   Final  ?  BOTTLES DRAWN AEROBIC AND ANAEROBIC Blood Culture adequate volume  ? Culture   Final  ?  NO GROWTH 2 DAYS ?Performed at [facility], [place] [address], [place], [place] [address] ?  ? Report Status PENDING  Incomplete  ?Resp Panel by RT-PCR (Flu A&B, Covid) Nasopharyngeal Swab     Status: Abnormal  ?  Collection Time: 07/25/21  8:04 PM  ? Specimen: Nasopharyngeal Swab; Nasopharyngeal(NP) swabs in vial transport medium  ?Result Value Ref Range Status  ? SARS Coronavirus 2 by RT PCR POSITIVE (A) NEGATIVE Final  ?  Comment: (NOTE) ?SARS-CoV-2 target nucleic acids are DETECTED. ? ?The SARS-CoV-2 RNA is generally detectable in upper respiratory ?specimens during the acute phase of infection. Positive results are ?indicative of the presence of the identified virus, but do not rule ?out bacterial infection or co-infection with other pathogens not ?detected by the test. Clinical correlation with patient history and ?other diagnostic information is necessary to determine patient ?infection status. The expected result is Negative. ? ?Fact Sheet for Patients: ?EntrepreneurPulse.com.au ? ?Fact Sheet for Healthcare Providers: ?IncredibleEmployment.be ? ?This test is not yet approved or  cleared by the [place] and  ?has been authorized for detection and/or diagnosis of SARS-CoV-2 by ?FDA under an Emergency Use Authorization (EUA).  This EUA will ?remain in effect (meaning this test can be used) for the duration of  ?the COVID-19 declaration under Section 564(b)(1) of the [place] ct, 21 ?U.S.C. section 360bbb-3(b)(1), unless the authorization is ?terminated or revoked sooner. ? ?  ? Influenza A by PCR NEGATIVE NEGATIVE Final  ? Influenza B by PCR NEGATIVE NEGATIVE Final  ?  Comment: (NOTE) ?The Xpert Xpress SARS-CoV-2/FLU/RSV plus assay is intended as an aid ?in the diagnosis of influenza from Nasopharyngeal swab specimens and ?should not be used as a sole basis for treatment. Nasal washings and ?aspirates are unacceptable for Xpert Xpress SARS-CoV-2/FLU/RSV ?testing. ? ?Fact Sheet for Patients: ?EntrepreneurPulse.com.au ? ?Fact Sheet for Healthcare Providers: ?IncredibleEmployment.be ? ?This test is not yet approved or cleared by the [place] FDA  and ?has been authorized for detection and/or diagnosis of SARS-CoV-2 by ?FDA under an Emergency Use Authorization (EUA). This EUA will remain ?in effect (meaning this test can be used) for the duration of the ?COVID-19 declaration under Section 5

## 2021-07-28 NOTE — Evaluation (Signed)
Occupational Therapy Evaluation ?Patient Details ?Name: [name] ?MRN: [id number] ?DOB: 01-Dec-1929 ?Today's Date: 07/28/2021 ? ? ?History of Present Illness Pt is a 88 yr old female who presented 4/8 from ILF  due to SOB. Pt found to be COVID +, sepsis due to bacterial pneumonia and UTI. PMH: RA, lung ca, COPD, CAD, HOH, hernia, R hip pain  ? ?Clinical Impression ?  ?Pt reports at PLOF they live in [place] setting with a daughter who lives near by. Pt was starting to have an aide to come in to start to assist with ADLS/IADLs. Pt in session was very anxious and noted decrease in safety awareness. Pt completed sit to stand transfer with min guard and min assist with squat pivot transfer to [facility]. Pt required total care for hygiene post loose BM. Pt then transfer to chair with min guard to min assist. Pt in session needed max cues when increase in anxious as decrease in safety awareness on how to complete tasks.   Pt currently with functional limitations due to the deficits listed below (see OT Problem List).  Pt will benefit from skilled OT to increase their safety and independence with ADL and functional mobility for ADL to facilitate discharge to venue listed below.  ?  ?   ? ?Recommendations for follow up therapy are one component of a multi-disciplinary discharge planning process, led by the attending physician.  Recommendations may be updated based on patient status, additional functional criteria and insurance authorization.  ? ?Follow Up Recommendations ? Skilled nursing-short term rehab (<3 hours/day) unless they have 24/7 support at home ?  ?Assistance Recommended at Discharge Frequent or constant Supervision/Assistance  ?Patient can return home with the following A lot of help with walking and/or transfers;A lot of help with bathing/dressing/bathroom;Assistance with cooking/housework;Assistance with feeding;Direct supervision/assist for medications management;Direct supervision/assist for financial  management;Assist for transportation ? ?  ?Functional Status Assessment ? Patient has had a recent decline in their functional status and demonstrates the ability to make significant improvements in function in a reasonable and predictable amount of time.  ?Equipment Recommendations ?  (TBD at site of care)  ?  ?Recommendations for Other Services   ? ? ?  ?Precautions / Restrictions Precautions ?Precautions: Fall (very anxious, does not want to stay in chair as feeling like they can not move. HOH) ?Restrictions ?Weight Bearing Restrictions: No  ? ?  ? ?Mobility Bed Mobility ?Overal bed mobility: Needs Assistance ?Bed Mobility: Supine to Sit ?  ?  ?Supine to sit: Min guard ?  ?  ?  ?  ? ?Transfers ?Overall transfer level: Needs assistance ?Equipment used: 1 person hand held assist ?Transfers: Sit to/from Stand ?Sit to Stand: Min guard, From elevated surface ?  ?  ?  ?  ?  ?  ?  ? ?  ?Balance Overall balance assessment: Needs assistance ?Sitting-balance support: Single extremity supported, Feet supported ?Sitting balance-Leahy Scale: Fair ?  ?  ?Standing balance support: Bilateral upper extremity supported ?Standing balance-Leahy Scale: Poor ?  ?  ?  ?  ?  ?  ?  ?  ?  ?  ?  ?  ?   ? ?ADL either performed or assessed with clinical judgement  ? ?ADL Overall ADL's : Needs assistance/impaired ?Eating/Feeding: Set up;Sitting ?  ?Grooming: Wash/dry hands;Wash/dry face;Set up;Sitting ?  ?Upper Body Bathing: Min guard;Sitting ?  ?Lower Body Bathing: Moderate assistance;Cueing for safety;Cueing for sequencing;Sitting/lateral leans ?  ?Upper Body Dressing : Min guard;Sitting ?  ?Lower Body  Dressing: Moderate assistance;Cueing for safety;Cueing for sequencing;Sit to/from stand ?  ?Toilet Transfer: Min guard;Cueing for safety;Cueing for sequencing;Rolling walker (2 wheels);Stand-pivot ?  ?Toileting- Clothing Manipulation and Hygiene: Cueing for safety;Cueing for sequencing;Sit to/from stand;Total assistance ?  ?  ?  ?  ?General  ADL Comments: deffer ambulation to next treatment, spoke to Physical THerapy about limitations  ? ? ? ?Vision   ?   ?   ?Perception   ?  ?Praxis   ?  ? ?Pertinent Vitals/Pain Pain Assessment ?Pain Assessment: No/denies pain ?Breathing: normal ?Negative Vocalization: none ?Facial Expression: smiling or inexpressive ?Body Language: relaxed ?Consolability: no need to console ?PAINAD Score: 0  ? ? ? ?Hand Dominance Right ?  ?Extremity/Trunk Assessment Upper Extremity Assessment ?Upper Extremity Assessment: RUE deficits/detail;LUE deficits/detail ?RUE Deficits / Details: Noted in BUE limited AROM due to RA but pt noted to be more limited in RUE compares to LUE for FM ?RUE Coordination: decreased fine motor;decreased gross motor ?LUE Deficits / Details: boney end feel with limitations due to RA ?LUE Coordination: decreased fine motor ?  ?Lower Extremity Assessment ?Lower Extremity Assessment: Defer to PT evaluation ?  ?Cervical / Trunk Assessment ?Cervical / Trunk Assessment: Kyphotic ?  ?Communication Communication ?Communication: HOH ?  ?Cognition Arousal/Alertness: Awake/alert ?Behavior During Therapy: Anxious ?Overall Cognitive Status: Difficult to assess ?  ?  ?  ?  ?  ?  ?  ?  ?  ?  ?  ?  ?  ?  ?  ?  ?General Comments: Noted decrease in saftey awareness in session but pt is also [facility] with understanding all cues given ?  ?  ?General Comments    ? ?  ?Exercises   ?  ?Shoulder Instructions    ? ? ?Home Living Family/patient expects to be discharged to:: Private residence ?Living Arrangements: Alone ?Available Help at Discharge: Family ?Type of Home: Independent living facility ?Home Access: Elevator ?  ?  ?Home Layout: One level ?  ?  ?Bathroom Shower/Tub: Tub/shower unit ?  ?Bathroom Toilet: Standard ?  ?  ?Home Equipment: Rollator (4 wheels);Grab bars - toilet;BSC/3in1 ?  ?Additional Comments: Pt reports they make meals in home and daughter will take them to md visists and shopping as needed ?  ? ?  ?Prior  Functioning/Environment Prior Level of Function : Independent/Modified Independent ?  ?  ?  ?  ?  ?  ?Mobility Comments: used 4QI, reported some near falls in th bathroom so stopped showering ?ADLs Comments: Per pt prior to this admission they were having a personal aide assisting them but they believe this is why they are sick ?  ? ?  ?  ?OT Problem List: Decreased strength;Decreased range of motion;Decreased activity tolerance;Impaired balance (sitting and/or standing);Decreased safety awareness;Decreased knowledge of use of DME or AE ?  ?   ?OT Treatment/Interventions: Self-care/ADL training;DME and/or AE instruction;Therapeutic activities;Patient/family education;Balance training  ?  ?OT Goals(Current goals can be found in the care plan section) Acute Rehab OT Goals ?Patient Stated Goal: to be not alone ?OT Goal Formulation: With patient ?Time For Goal Achievement: 08/11/21 ?Potential to Achieve Goals: Good ?ADL Goals ?Pt Will Perform Eating: with modified independence ?Pt Will Perform Grooming: with modified independence;sitting ?Pt Will Perform Upper Body Bathing: with modified independence;sitting ?Pt Will Perform Lower Body Bathing: with set-up;sit to/from stand  ?OT Frequency: Min 2X/week ?  ? ?Co-evaluation   ?  ?  ?  ?  ? ?  ?AM-PAC OT "6 Clicks" Daily Activity     ?  Outcome Measure Help from another person eating meals?: A Little ?Help from another person taking care of personal grooming?: A Little ?Help from another person toileting, which includes using toliet, bedpan, or urinal?: Total ?Help from another person bathing (including washing, rinsing, drying)?: A Lot ?Help from another person to put on and taking off regular upper body clothing?: A Little ?Help from another person to put on and taking off regular lower body clothing?: A Lot ?6 Click Score: 14 ?  ?End of Session Equipment Utilized During Treatment: Gait belt;Rolling walker (2 wheels) ?Nurse Communication: Mobility status ? ?Activity  Tolerance: Other (comment) (pt very anxious in session) ?Patient left: in chair;with call bell/phone within reach;with nursing/sitter in room;Other (comment) (transitioned to nurse care then to Physical Therapy) ? ?OT Visit Diagnos

## 2021-07-29 DIAGNOSIS — N179 Acute kidney failure, unspecified: Secondary | ICD-10-CM | POA: Diagnosis not present

## 2021-07-29 DIAGNOSIS — M0579 Rheumatoid arthritis with rheumatoid factor of multiple sites without organ or systems involvement: Secondary | ICD-10-CM | POA: Diagnosis not present

## 2021-07-29 DIAGNOSIS — U071 COVID-19: Secondary | ICD-10-CM | POA: Diagnosis not present

## 2021-07-29 DIAGNOSIS — J9601 Acute respiratory failure with hypoxia: Secondary | ICD-10-CM

## 2021-07-29 LAB — COMPREHENSIVE METABOLIC PANEL
ALT: 23 U/L (ref 0–44)
AST: 48 U/L — ABNORMAL HIGH (ref 15–41)
Albumin: 2.9 g/dL — ABNORMAL LOW (ref 3.5–5.0)
Alkaline Phosphatase: 32 U/L — ABNORMAL LOW (ref 38–126)
Anion gap: 10 (ref 5–15)
BUN: 68 mg/dL — ABNORMAL HIGH (ref 8–23)
CO2: 24 mmol/L (ref 22–32)
Calcium: 8.3 mg/dL — ABNORMAL LOW (ref 8.9–10.3)
Chloride: 108 mmol/L (ref 98–111)
Creatinine, Ser: 1.52 mg/dL — ABNORMAL HIGH (ref 0.44–1.00)
GFR, Estimated: 33 mL/min — ABNORMAL LOW (ref >60)
Glucose, Bld: 253 mg/dL — ABNORMAL HIGH (ref 70–99)
Potassium: 4.2 mmol/L (ref 3.5–5.1)
Sodium: 142 mmol/L (ref 135–145)
Total Bilirubin: 0.3 mg/dL (ref 0.3–1.2)
Total Protein: 5.1 g/dL — ABNORMAL LOW (ref 6.5–8.1)

## 2021-07-29 LAB — CBC WITH DIFFERENTIAL/PLATELET
Abs Immature Granulocytes: 0.08 10*3/uL — ABNORMAL HIGH (ref 0.00–0.07)
Basophils Absolute: 0 10*3/uL (ref 0.0–0.1)
Basophils Relative: 0 %
Eosinophils Absolute: 0 10*3/uL (ref 0.0–0.5)
Eosinophils Relative: 0 %
HCT: 32.9 % — ABNORMAL LOW (ref 36.0–46.0)
Hemoglobin: 10.7 g/dL — ABNORMAL LOW (ref 12.0–15.0)
Immature Granulocytes: 1 %
Lymphocytes Relative: 3 %
Lymphs Abs: 0.3 10*3/uL — ABNORMAL LOW (ref 0.7–4.0)
MCH: 33.2 pg (ref 26.0–34.0)
MCHC: 32.5 g/dL (ref 30.0–36.0)
MCV: 102.2 fL — ABNORMAL HIGH (ref 80.0–100.0)
Monocytes Absolute: 0.3 10*3/uL (ref 0.1–1.0)
Monocytes Relative: 3 %
Neutro Abs: 8.1 10*3/uL — ABNORMAL HIGH (ref 1.7–7.7)
Neutrophils Relative %: 93 %
Platelets: 129 10*3/uL — ABNORMAL LOW (ref 150–400)
RBC: 3.22 MIL/uL — ABNORMAL LOW (ref 3.87–5.11)
RDW: 14.3 % (ref 11.5–15.5)
WBC: 8.7 10*3/uL (ref 4.0–10.5)
nRBC: 0 % (ref 0.0–0.2)

## 2021-07-29 LAB — MAGNESIUM: Magnesium: 2.4 mg/dL (ref 1.7–2.4)

## 2021-07-29 LAB — C-REACTIVE PROTEIN: CRP: 5.2 mg/dL — ABNORMAL HIGH (ref <1.0)

## 2021-07-29 MED ORDER — LOPERAMIDE HCL 2 MG PO CAPS
2.0000 mg | ORAL_CAPSULE | ORAL | Status: DC | PRN
  Administered 2021-07-29 – 2021-08-05 (×7): 2 mg via ORAL
  Filled 2021-07-29 (×7): qty 1

## 2021-07-29 NOTE — NC FL2 (Signed)
?[place] MEDICAID FL2 LEVEL OF CARE SCREENING TOOL  ?  ? ?IDENTIFICATION  ?Patient Name: ?[name] Birthdate: [birth year] Sex: female Admission Date (Current Location): ?07/25/2021  ?[place] and [place] Number: ? [place] ?  Facility and Address:  ?[facility] [place] [place] [address], [place], [place] [address] ?     Provider Number: ?[id number]  ?Attending Physician Name and Address:  ?Jonetta Osgood, MD ? Relative Name and Phone Number:  ?Shirleen Schirmer (daughter) [phone] ?   ?Current Level of Care: ?Hospital Recommended Level of Care: ?[place] Prior Approval Number: ?  ? ?Date Approved/Denied: ?  PASRR Number: ?[id number] A ? ?Discharge Plan: ?SNF ?  ? ?Current Diagnoses: ?Patient Active Problem List  ? Diagnosis Date Noted  ? COVID-19 virus infection 07/25/2021  ? Sepsis ([place]) 07/25/2021  ? AKI (acute kidney injury) ([place]) 07/25/2021  ? Hypotension 07/25/2021  ? Plantar wart 04/21/2021  ? Spinal stenosis 04/21/2021  ? Recurrent major depression in remission ([place]) 02/19/2021  ? Acute non-ST segment elevation myocardial infarction ([place]) 04/01/2020  ? Ataxia 04/01/2020  ? Frail elderly 04/01/2020  ? Heart disease 04/01/2020  ? Hypercholesterolemia 04/01/2020  ? Insomnia 04/01/2020  ? Ovarian mass 04/01/2020  ? Malignant neoplasm of lung ([place]) 04/01/2020  ? Early stage nonexudative age-related macular degeneration of both eyes 02/26/2020  ? Posterior vitreous detachment of both eyes 02/26/2020  ? Abnormal CT of the chest 09/01/2017  ? Adult failure to thrive 09/01/2017  ? Neurocognitive disorder 08/30/2017  ? NSTEMI (non-ST elevated myocardial infarction) ([place]) 08/25/2017  ? Rheumatoid nodulosis ([place]) 06/09/2016  ? Primary osteoarthritis of both hands 06/09/2016  ? Primary osteoarthritis of both feet 06/09/2016  ? History of lung cancer 06/09/2016  ? High risk medication use 06/09/2016  ? Aortic calcification ([place]) 05/10/2013  ? Coronary artery calcification 05/10/2013  ?  Protein-calorie malnutrition, severe ([place]) 12/25/2012  ? Depression, recurrent ([place]) 12/25/2012  ? Anemia of chronic disease 12/24/2012  ? Thrombocytopenia, unspecified 12/24/2012  ? Aphthous stomatitis 12/24/2012  ? Bleeding from nasopharynx 12/24/2012  ? Cellulitis 12/23/2012  ? Hemoptysis? Secondary to either irritation by dentures, vs. secondary to lung neoplasm 12/23/2012  ? Rheumatoid arthritis-severe 12/23/2012  ? COPD, mild ([place]) 12/23/2012  ? Osteoporosis 12/23/2012  ? Primary cancer of right upper lobe of lung ([place]) 03/21/2012  ? ? ?Orientation RESPIRATION BLADDER Height & Weight   ?  ?Time, Situation, Place, Self ? O2 (1L Nasal Cannula) Incontinent, External catheter Weight: 102 lb (46.3 kg) ?Height:  4\' 8"  (142.2 cm)  ?BEHAVIORAL SYMPTOMS/MOOD NEUROLOGICAL BOWEL NUTRITION STATUS  ?    Incontinent Diet (See DC Summary)  ?AMBULATORY STATUS COMMUNICATION OF NEEDS Skin   ?Extensive Assist Verbally Normal ?  ?  ?  ?    ?     ?     ? ? ?Personal Care Assistance Level of Assistance  ?Bathing, Feeding, Dressing Bathing Assistance: Maximum assistance ?Feeding assistance: Limited assistance ?Dressing Assistance: Maximum assistance ?   ? ?Functional Limitations Info  ?Hearing, Sight, Speech Sight Info: Adequate ?Hearing Info: Impaired ?Speech Info: Adequate  ? ? ?SPECIAL CARE FACTORS FREQUENCY  ?PT (By licensed PT), OT (By licensed OT)   ?  ?PT Frequency: 5x per week ?OT Frequency: 5x per week ?  ?  ?  ?   ? ? ?Contractures Contractures Info: Not present  ? ? ?Additional Factors Info  ?Code Status, Allergies, Isolation Precautions Code Status Info: DNR ?Allergies Info: Latex, Mouthwashes, Darvocet (Propoxyphene N-acetaminophen), Erythromycin, Hydrocodone-acetaminophen, Ibandronic  Acid, Morphine And Related, Oxycodone Hcl, Oxycontin (Oxycodone Hcl), Sulfa Antibiotics, Tramadol ?  ?  ?Isolation Precautions Info: COVID+ 07/25/21 ?   ? ?Current Medications (07/29/2021):  This is the current hospital active medication  list ?Current Facility-Administered Medications  ?Medication Dose Route Frequency Provider Last Rate Last Admin  ? acetaminophen (TYLENOL) tablet 650 mg  650 mg Oral Q6H PRN Nolberto Hanlon, MD   650 mg at 07/27/21 2145  ? albuterol (VENTOLIN HFA) 108 (90 Base) MCG/ACT inhaler 2 puff  2 puff Inhalation Q4H PRN Nolberto Hanlon, MD   2 puff at 07/27/21 1118  ? azithromycin (ZITHROMAX) 500 mg in sodium chloride 0.9 % 250 mL IVPB  500 mg Intravenous Q24H Amin, Ankit Chirag, MD 250 mL/hr at 07/29/21 1112 500 mg at 07/29/21 1112  ? cefTRIAXone (ROCEPHIN) 1 g in sodium chloride 0.9 % 100 mL IVPB  1 g Intravenous Q24H Damita Lack, MD   Stopped at 07/29/21 1111  ? dextromethorphan-guaiFENesin (MUCINEX DM) 30-600 MG per 12 hr tablet 1 tablet  1 tablet Oral BID PRN Amin, Ankit Chirag, MD      ? enoxaparin (LOVENOX) injection 30 mg  30 mg Subcutaneous Daily Tu, Ching T, DO   30 mg at 07/29/21 1057  ? hydrALAZINE (APRESOLINE) injection 10 mg  10 mg Intravenous Q6H PRN Nolberto Hanlon, MD   10 mg at 07/29/21 1043  ? Ipratropium-Albuterol (COMBIVENT) respimat 1 puff  1 puff Inhalation BID Damita Lack, MD   1 puff at 07/29/21 1041  ? loperamide (IMODIUM) capsule 2 mg  2 mg Oral PRN Jonetta Osgood, MD   2 mg at 07/29/21 1334  ? mometasone-formoterol (DULERA) 100-5 MCG/ACT inhaler 2 puff  2 puff Inhalation BID Damita Lack, MD   2 puff at 07/29/21 1043  ? ondansetron (ZOFRAN) injection 4 mg  4 mg Intravenous Q6H PRN Shalhoub, Sherryll Burger, MD      ? predniSONE (DELTASONE) tablet 40 mg  40 mg Oral Q breakfast Amin, Ankit Chirag, MD   40 mg at 07/29/21 1049  ? remdesivir 100 mg in sodium chloride 0.9 % 100 mL IVPB  100 mg Intravenous Daily Nolberto Hanlon, MD   Stopped at 07/29/21 1110  ? senna-docusate (Senokot-S) tablet 1 tablet  1 tablet Oral QHS PRN Amin, Ankit Chirag, MD      ? umeclidinium-vilanterol (ANORO ELLIPTA) 62.5-25 MCG/ACT 1 puff  1 puff Inhalation Daily PRN Damita Lack, MD      ? ? ? ?Discharge  Medications: ?Please see discharge summary for a list of discharge medications. ? ?Relevant Imaging Results: ? ?Relevant Lab Results: ? ? ?Additional Information ?Moderna COVID-19 Vaccine 12/27/2019 , 11/28/2019 SSN: [id number] ? ?Lissa Morales Catricia Scheerer, LCSW ? ? ? ? ?

## 2021-07-29 NOTE — Progress Notes (Signed)
Sent Dr. Sloan Leiter secure chat and let MD know that patient is anxious and that she has had 4 loose stools today. Waiting for response from MD.  ?

## 2021-07-29 NOTE — Progress Notes (Signed)
PT Cancellation Note ? ?Patient Details ?Name: [name] ?MRN: [id number] ?DOB: December 10, 1929 ? ? ?Cancelled Treatment:    Reason Eval/Treat Not Completed: Fatigue/lethargy limiting ability to participate Patient refusing OOB this date due to anxiousness and fatigue from frequent loose BM this date. Will follow up at later date.  ? ?Corneisha Alvi A. [place] Rile, PT, DPT ?Acute Rehabilitation Services ?Pager [phone] ?Office [phone] ? ? ? ?[name] ?07/29/2021, 2:52 PM ?

## 2021-07-29 NOTE — TOC Initial Note (Signed)
Transition of Care (TOC) - Initial/Assessment Note  ? ? ?Patient Details  ?Name: [name] ?MRN: [id number] ?Date of Birth: 02-28-1930 ? ?Transition of Care (TOC) CM/SW Contact:    ?Benard Halsted, LCSW ?Phone Number: ?07/29/2021, 9:45 AM ? ?Clinical Narrative:                 ?CSW received consult for possible SNF placement at time of discharge. CSW spoke with patient's daughter who confirmed patient lives alone at [facility] apartments and family is unable to physically assist her. She expressed understanding of PT recommendation and is agreeable to SNF placement at time of discharge. Patient reports preference for somewhere like [place] or [place]. CSW discussed insurance authorization process and will provide Medicare SNF ratings list. Patient has received COVID vaccines. CSW will send out referrals for review and explained current barrier of COVID+ status. CSW discussed potential of [facility] accepting COVID patients after a week but she declined facility. CSW answered questions about CIR and she stated SNF is probably more appropriate. She is aware that insurance may not approve patient for SNF if she has improved once the 10 isolation days are up and hopes that patient does not receive a hospital bill for staying.  ? ?Skilled Nursing Rehab Facilities-   [email or web address]   Ratings out of 5 possible   ?Name Address  Phone # Quality Care Staffing Health Inspection Overall  ?[facility] [address], [place] 5 5 2 4   ?[facility]  [address], [place] [phone] 4 2 5 5   ?[facility], [place] 4 1 1 1   ?[facility] [place], [place] 2 2 4 4   ?[facility] [address] [place] 1 1 2 1   ?[place] 2 1 4 3   ?[facility] [address] [place] 5 2 2 3    ?[facility] [address] [place] 5 2 2 3   ?[address] ([place]) [address], [place] [phone] 5 1 2 2   ?[facility] [address], [place] [phone] 4 1 1 1   ?[facility] [address], [place] [phone] 4 1 2 1   ?[facility] [place] [facility]) [place] [phone] 4 1 1 1   ?        ?[place], [place]      ?[facility] 4 2 3 3   ?[facility] [address] [place] 3 1 5 4   ?[address], [place] [address] [place] [address], [place] [phone] 2 1 1 1   ?[facility] [address] [place] [phone] 2 2 3 3   ?        ?[address] (no [facility]) [address], [place] [phone] 4 5 5 5   ?[facility] (No Hum[employer]) [address], [place] 4 1 4 3   ?[facility] (No UHC) [address], [place] [phone] 5 5 5 5   ?[facility] [address] [place] [phone] 3 2 4 4   ?[facility] [address] [place] 3 3 4 4   ?[facility] [facility] [address], [place] 1 1 2 1   ?[facility] [place] [phone] 2 1 1 1   ?[facility] [place] 5 2 4 5   ?[facility] [address] [place] 3  1 1 1   ?[facility] [place] 2 1 2 1   ?        ?[facility] [address] [place] 1 1 1 1   ?[place] [address] [place]  [phone] 2 4 2 2   ?[facility] [address], [place] [phone] 5 2 3 4   ?[place] [address] [place] 2 1 1 1   ?[place] (No Humana) [address] [place] 2 1 3 2   ?[facility] [address] [place] [phone] 3 1 1 1   ?        ?[facility] [place],  [place] 5 4 5 5   ?[facility])  [address], [place] 2 2 3 3   ?[facility]) [place], [place] 3 2 4 4   ?[place] [address], [place] 4 3 4 4   ?[address] [place], [place] 3 3 1 1   ?[facility] [place]) [address] [place] [phone] 2 2 4 4   ? ? ? ?Expected Discharge Plan: [place] ?Barriers to Discharge: SNF Covid, SNF Pending bed offer, Insurance Authorization ? ? ?Patient Goals and CMS Choice ?Patient states their goals for this hospitalization and ongoing recovery are:: Rehab ?CMS Medicare.gov Compare Post Acute Care list provided to:: Patient Represenative (must comment) ?Choice offered to / list presented to : Adult Children, Patient ? ?Expected Discharge Plan and Services ?Expected Discharge Plan: [place] ?In-house Referral: Clinical Social Work ?  ?Post Acute Care Choice: [place] ?Living arrangements for the past 2 months: Apartment ?                ?  ?  ?  ?  ?  ?  ?  ?  ?  ?  ? ?Prior Living Arrangements/Services ?Living arrangements for the past 2 months: Apartment ?Lives with:: Self ?Patient language and need for interpreter reviewed:: Yes ?Do you feel safe going back to the place where you live?: Yes      ?Need for Family Participation in Patient Care: Yes (Comment) ?Care giver support system in place?: Yes (comment) ?  ?Criminal Activity/Legal Involvement Pertinent to Current Situation/Hospitalization: No - Comment as needed ? ?Activities of Daily Living ?Home Assistive Devices/Equipment: [place] Rile (specify type) ?ADL Screening (condition at time of admission) ?Patient's cognitive ability adequate to safely complete daily activities?: Yes ?Is the patient deaf or have difficulty hearing?: Yes ?Does the patient have difficulty seeing, even when wearing glasses/contacts?: No ?Does the  patient have difficulty concentrating, remembering, or making decisions?: Yes ?Patient able to express need for assistance with ADLs?: Yes ?Does the patient have difficulty dressing or bathing?: Yes ?Independently performs ADLs?: No ?Does the patient have difficulty walking or climbing stairs?: Yes ?Weakness of Legs: Right ?Weakness of Arms/Hands: Both ? ?Permission Sought/Granted ?Permission sought to share information with : Facility Sport and exercise psychologist, Family Supports ?Permission granted to share information with : Yes, Verbal Permission Granted ? Share Information with NAME: [name] ? Permission granted to share info w AGENCY: SNFs ? Permission granted to share info w Relationship: Daugher ? Permission granted to share info w Contact Information: [phone] ? ?Emotional Assessment ?  ?Attitude/Demeanor/Rapport: Engaged ?Affect (typically observed): Appropriate ?Orientation: : Oriented to Self, Oriented to Place, Oriented to  Time, Oriented to Situation ?Alcohol / Substance Use: Not Applicable ?Psych Involvement:  No (comment) ? ?Admission diagnosis:  Severe sepsis ([place]) [A41.9, R65.20] ?Acute hypoxemic respiratory failure ([place]) [J96.01] ?COVID-19 virus infection [U07.1] ?COVID-19 [U07.1] ?Patient Active Problem List  ? Diagnosis Date Noted  ? COVID-19 virus infection 07/25/2021  ? Sepsis ([place]) 07/25/2021  ? AKI (acute kidney injury) ([place]) 07/25/2021  ? Hypotension 07/25/2021  ? Plantar wart 04/21/2021  ? Spinal stenosis 04/21/2021  ? Recurrent major depression in remission ([place]) 02/19/2021  ? Acute non-ST segment elevation myocardial infarction ([place]) 04/01/2020  ? Ataxia 04/01/2020  ? Frail elderly 04/01/2020  ? Heart disease 04/01/2020  ? Hypercholesterolemia 04/01/2020  ? Insomnia 04/01/2020  ? Ovarian mass 04/01/2020  ? Malignant neoplasm of lung ([place]) 04/01/2020  ? Early stage nonexudative age-related macular degeneration of both eyes 02/26/2020  ? Posterior vitreous detachment of both eyes 02/26/2020  ?  Abnormal CT of the chest 09/01/2017  ? Adult failure to thrive 09/01/2017  ? Neurocognitive disorder 08/30/2017  ? NSTEMI (non-ST elevated myocardial infarction) ([place]) 08/25/2017  ? Rheumatoid nodulosis ([facility]) 0

## 2021-07-29 NOTE — Progress Notes (Signed)
Occupational Therapy Treatment ?Patient Details ?Name: [name] ?MRN: [id number] ?DOB: 10/30/1929 ?Today's Date: 07/29/2021 ? ? ?History of present illness Pt is a 88 yr old female who presented 4/8 from [facility]  due to SOB. Pt found to be COVID +, sepsis due to bacterial pneumonia and UTI. PMH: RA, lung ca, COPD, CAD, HOH, hernia, R hip pain ?  ?OT comments ? Patient was noted to be very anxious during session with it heavily impacting participation on this date. Nurse made aware. Patient was continuously educated on slowing down respiration rate during session with O2 saturation maintaining in high 90s during whole session on RA. Patient would demonstrate understanding with continued education needed with each task. Patient was able to complete bed mobility with SUP scooting up to head of bed with increased time and cues for proper hand and foot placement. Patient continues to have decreased functional activity tolerance decreased endurance and poor safety awareness impacting participation in ADLs. Patient would continue to benefit from skilled OT services at this time while admitted and after d/c to address noted deficits in order to improve overall safety and independence in ADLs. Patient's discharge plan remains appropriate at this time. OT will continue to follow acutely.  ?  ? ?Recommendations for follow up therapy are one component of a multi-disciplinary discharge planning process, led by the attending physician.  Recommendations may be updated based on patient status, additional functional criteria and insurance authorization. ?   ?Follow Up Recommendations ? Skilled nursing-short term rehab (<3 hours/day)  ?  ?Assistance Recommended at Discharge Frequent or constant Supervision/Assistance  ?Patient can return home with the following ? A lot of help with walking and/or transfers;A lot of help with bathing/dressing/bathroom;Assistance with cooking/housework;Assistance with feeding;Direct supervision/assist  for medications management;Direct supervision/assist for financial management;Assist for transportation ?  ?Equipment Recommendations ?    ?  ?Recommendations for Other Services   ? ?  ?Precautions / Restrictions Precautions ?Precautions: Fall ?Precaution Comments: HOH and anxiety ?Restrictions ?Weight Bearing Restrictions: No  ? ? ?  ? ?Mobility Bed Mobility ?Overal bed mobility: Needs Assistance ?Bed Mobility: Supine to Sit ?  ?  ?Supine to sit: Min guard ?  ?  ?General bed mobility comments: patient was min guard for movement in bed with increased time and cues for deep breathing to slow SOB. O2 as in 90s during entire session ?  ? ?Transfers ?  ?  ?  ?  ?  ?  ?  ?  ?  ?  ?  ?  ?Balance Overall balance assessment: Needs assistance ?Sitting-balance support: Feet supported ?Sitting balance-Leahy Scale: Fair ?  ?  ?  ?  ?  ?  ?  ?  ?  ?  ?  ?  ?  ?  ?  ?  ?   ? ?ADL either performed or assessed with clinical judgement  ? ?ADL Overall ADL's : Needs assistance/impaired ?  ?  ?  ?  ?  ?  ?  ?  ?  ?  ?  ?  ?  ?  ?  ?  ?  ?  ?  ?General ADL Comments: patient declined to participate in time out of bed on this date stating she had been up with nursing various times this AM with loose stools that made her very nauseated. patient is anxious with all activity with patient quickly jumping from one task to another with difficulty slowing down for cues and hard of hearing. patient was repositioned in  bed with scooting up with patietn able to complete with SUP. patient is particular about item placement in room with HOH. ?  ? ?Extremity/Trunk Assessment   ?  ?  ?  ?  ?  ? ?Vision   ?  ?  ?Perception   ?  ?Praxis   ?  ? ?Cognition Arousal/Alertness: Awake/alert ?Behavior During Therapy: Anxious ?Overall Cognitive Status: Difficult to assess ?  ?  ?  ?  ?  ?  ?  ?  ?  ?  ?  ?  ?  ?  ?  ?  ?General Comments: patient noted to have decreased safety awareness and carryover of education. patient is very hard of hearing and difficult to  understand if able to get all of cues provided. patient is noted to be able to understand writing to her but anxious to be able to receive information this way asking to read it prior to therapist being down writing first statement ?  ?  ?   ?Exercises   ? ?  ?Shoulder Instructions   ? ? ?  ?General Comments    ? ? ?Pertinent Vitals/ Pain       Pain Assessment ?Pain Assessment: No/denies pain ? ?Home Living   ?  ?  ?  ?  ?  ?  ?  ?  ?  ?  ?  ?  ?  ?  ?  ?  ?  ?  ? ?  ?Prior Functioning/Environment    ?  ?  ?  ?   ? ?Frequency ? Min 2X/week  ? ? ? ? ?  ?Progress Toward Goals ? ?OT Goals(current goals can now be found in the care plan section) ? Progress towards OT goals: OT to reassess next treatment (anxiousness and lethargy limiting session) ? ?   ?Plan Discharge plan remains appropriate   ? ?Co-evaluation ? ? ?   ?  ?  ?  ?  ? ?  ?AM-PAC OT "6 Clicks" Daily Activity     ?Outcome Measure ? ? Help from another person eating meals?: A Little ?Help from another person taking care of personal grooming?: A Little ?Help from another person toileting, which includes using toliet, bedpan, or urinal?: Total ?Help from another person bathing (including washing, rinsing, drying)?: A Lot ?Help from another person to put on and taking off regular upper body clothing?: A Little ?Help from another person to put on and taking off regular lower body clothing?: A Lot ?6 Click Score: 14 ? ?  ?End of Session   ? ?OT Visit Diagnosis: Unsteadiness on feet (R26.81);Other abnormalities of gait and mobility (R26.89);Muscle weakness (generalized) (M62.81);Pain ?  ?Activity Tolerance Other (comment);Patient limited by fatigue (patients session was limited by anxiousness) ?  ?Patient Left in bed;with call bell/phone within reach ?  ?Nurse Communication Mobility status ?  ? ?   ? ?Time: 9826-4158 ?OT Time Calculation (min): 17 min ? ?Charges: OT General Charges ?$OT Visit: 1 Visit ?OT Treatments ?$Therapeutic Activity: 8-22 mins ? ?Yenifer Saccente  OTR/L, MS ?Acute Rehabilitation Department ?Office# [phone] ?Pager# [phone] ? ? ? ?07/29/2021, 2:48 PM ?

## 2021-07-29 NOTE — Progress Notes (Signed)
?      ?                 PROGRESS NOTE ? ?      ?PATIENT DETAILS ?Name: [name] ?Age: 88 y.o. ?Sex: female ?Date of Birth: November 13, 1929 ?Admit Date: 07/25/2021 ?Admitting Physician Orene Desanctis, DO ?ZOX:WRUE, [name], MD ? ?Brief Summary: ?Patient is a 88 y.o.  female with remote history of lung cancer, steroid-dependent RA, COPD, CAD-who presented with shortness of breath-she was thought to have sepsis with hypoxia due to COVID-19 infection and subsequently admitted to the hospitalist service.  See below for further details. ? ?Significant events: ?4/8>> admit to [facility] for sepsis/hypoxia due to COVID-19 infection. ? ?Significant studies: ?4/8>> CXR: No active disease-s/p posttreatment changes on the right lung. ?4/9>> CXR: Findings highly concerning for probable recurrence of right-sided lung cancer with postobstructive changes in the right upper lobe. ? ?Significant microbiology data: ?4/8>> COVID PCR: Positive ?4/8>> blood culture: Negative ?4/10>> stool C. difficile studies: Negative ? ?Procedures: ?None ? ?Consults: ?None  ? ?Subjective: ?Lying comfortably in bed-denies any chest pain or shortness of breath.  Per nursing staff-some loose stools overnight. ? ?Objective: ?Vitals: ?Blood pressure (!) 122/51, pulse 77, temperature 98.7 ?F (37.1 ?C), temperature source Oral, resp. rate (!) 22, height 4\' 8"  (1.422 m), weight 46.3 kg, SpO2 100 %.  ? ?Exam: ?Gen Exam:Alert awake-not in any distress ?HEENT:atraumatic, normocephalic ?Chest: B/L clear to auscultation anteriorly ?CVS:S1S2 regular ?Abdomen:soft non tender, non distended ?Extremities:no edema ?Neurology: Non focal ?Skin: no rash ? ?Pertinent Labs/Radiology: ? ?  Latest Ref Rng & Units 07/29/2021  ?  1:53 AM 07/28/2021  ?  2:58 AM 07/27/2021  ?  3:04 AM  ?CBC  ?WBC 4.0 - 10.5 K/uL 8.7   6.2   3.9    ?Hemoglobin 12.0 - 15.0 g/dL 10.7   11.0   12.0    ?Hematocrit 36.0 - 46.0 % 32.9   34.1   37.4    ?Platelets 150 - 400 K/uL 129   140   121    ?  ?Lab Results   ?Component Value Date  ? NA 142 07/29/2021  ? K 4.2 07/29/2021  ? CL 108 07/29/2021  ? CO2 24 07/29/2021  ?  ? ? ?Assessment/Plan: ?Sepsis: Due to COVID-19 infection-sepsis physiology has resolved with supportive care. ? ?Acute hypoxic respiratory failure due to COVID-19 infection +/- superimposed bacterial pneumonia and COPD exacerbation: Hypoxia has improved-on Remdesivir/Rocephin/Zithromax (elevated procalcitonin) and tapering steroids.  Continue bronchodilators.  Given CXR findings on 4/9-we will get a CT chest to delineate lung parenchyma further. ? ?AKI: Appears to be mild-likely due to hemodynamically mediated kidney injury in the setting of sepsis/diarrhea.  Continue supportive care. ? ?Diarrhea: C. difficile studies negative-could be due to COVID-19 infection.  Start as needed Imodium. ? ?Steroid-dependent RA: Chronically on 10 mg of prednisone as an outpatient-currently on escalating doses of steroid for COVID-19 infection/COPD exacerbation-will need to be tapered back down to her usual regimen. ? ?HTN: BP was soft-hence all antihypertensives on hold-resume when able. ? ?Remote history of lung cancer: See above regarding plans to obtain CT chest today. ? ?Debility/deconditioning: Due to acute illness-recommendations are for SNF on discharge. ? ?BMI: ?Estimated body mass index is 22.87 kg/m? as calculated from the following: ?  Height as of this encounter: 4\' 8"  (1.422 m). ?  Weight as of this encounter: 46.3 kg.  ? ?Code status: ?  Code Status: DNR  ? ?DVT Prophylaxis: ?enoxaparin (LOVENOX) injection  30 mg Start: 07/26/21 1000 ?  ?Family Communication: Daughter-[name]-[phone]-updated over the phone on 4/12 ? ? ?Disposition Plan: ?Status is: Inpatient ?Remains inpatient appropriate because: Resolving sepsis/hypoxia-remains on IV antibiotics/Remdesivir-not yet stable for discharge. ?  ?Planned Discharge Destination:Skilled nursing facility ? ? ?Diet: ?Diet Order   ? ?       ?  Diet regular Room service  appropriate? No; Fluid consistency: Thin  Diet effective now       ?  ? ?  ?  ? ?  ?  ? ? ?Antimicrobial agents: ?Anti-infectives (From admission, onward)  ? ? Start     Dose/Rate Route Frequency Ordered Stop  ? 07/27/21 1000  remdesivir 100 mg in sodium chloride 0.9 % 100 mL IVPB       ?See Hyperspace for full Linked Orders Report.  ? 100 mg ?200 mL/hr over 30 Minutes Intravenous Daily 07/26/21 1138 07/31/21 0959  ? 07/27/21 0930  cefTRIAXone (ROCEPHIN) 1 g in sodium chloride 0.9 % 100 mL IVPB       ? 1 g ?200 mL/hr over 30 Minutes Intravenous Every 24 hours 07/27/21 0835    ? 07/27/21 0930  azithromycin (ZITHROMAX) 500 mg in sodium chloride 0.9 % 250 mL IVPB       ? 500 mg ?250 mL/hr over 60 Minutes Intravenous Every 24 hours 07/27/21 0835 08/01/21 0929  ? 07/26/21 2200  molnupiravir EUA (LAGEVRIO) capsule 800 mg  Status:  Discontinued       ? 4 capsule Oral 2 times daily 07/26/21 0955 07/26/21 1139  ? 07/26/21 1230  remdesivir 200 mg in sodium chloride 0.9% 250 mL IVPB       ?See Hyperspace for full Linked Orders Report.  ? 200 mg ?580 mL/hr over 30 Minutes Intravenous Once 07/26/21 1138 07/26/21 1349  ? 07/26/21 1045  molnupiravir EUA (LAGEVRIO) capsule 800 mg  Status:  Discontinued       ? 4 capsule Oral 2 times daily 07/26/21 0945 07/26/21 0955  ? 07/25/21 2359  nirmatrelvir/ritonavir EUA (renal dosing) (PAXLOVID) 2 tablet  Status:  Discontinued       ? 2 tablet Oral 2 times daily 07/25/21 2303 07/26/21 0945  ? 07/25/21 2300  nirmatrelvir/ritonavir EUA (PAXLOVID) 3 tablet  Status:  Discontinued       ? 3 tablet Oral 2 times daily 07/25/21 2259 07/25/21 2303  ? 07/25/21 1915  cefTRIAXone (ROCEPHIN) 2 g in sodium chloride 0.9 % 100 mL IVPB  Status:  Discontinued       ? 2 g ?200 mL/hr over 30 Minutes Intravenous Every 24 hours 07/25/21 1901 07/25/21 2259  ? 07/25/21 1915  doxycycline (VIBRAMYCIN) 100 mg in sodium chloride 0.9 % 250 mL IVPB  Status:  Discontinued       ? 100 mg ?125 mL/hr over 120 Minutes  Intravenous Every 12 hours 07/25/21 1901 07/25/21 2259  ? ?  ? ? ? ?MEDICATIONS: ?Scheduled Meds: ? enoxaparin (LOVENOX) injection  30 mg Subcutaneous Daily  ? Ipratropium-Albuterol  1 puff Inhalation BID  ? mometasone-formoterol  2 puff Inhalation BID  ? predniSONE  40 mg Oral Q breakfast  ? ?Continuous Infusions: ? azithromycin 500 mg (07/29/21 1112)  ? cefTRIAXone (ROCEPHIN)  IV Stopped (07/29/21 1111)  ? remdesivir 100 mg in NS 100 mL Stopped (07/29/21 1110)  ? ?PRN Meds:.acetaminophen, albuterol, dextromethorphan-guaiFENesin, hydrALAZINE, ondansetron (ZOFRAN) IV, senna-docusate, umeclidinium-vilanterol ? ? ?I have personally reviewed following labs and imaging studies ? ?LABORATORY DATA: ?CBC: ?Recent Labs  ?Lab 07/25/21 ?[place]  07/26/21 ?9842 07/27/21 ?0304 07/28/21 ?1031 07/29/21 ?0153  ?WBC 8.3 5.9 3.9* 6.2 8.7  ?NEUTROABS 5.5 3.6 3.2 5.7 8.1*  ?HGB 11.9* 10.5* 12.0 11.0* 10.7*  ?HCT 38.1 32.6* 37.4 34.1* 32.9*  ?MCV 104.1* 104.2* 101.4* 99.7 102.2*  ?PLT 159 146* 121* 140* 129*  ? ? ?Basic Metabolic Panel: ?Recent Labs  ?Lab 07/25/21 ?1820 07/26/21 ?0455 07/27/21 ?0304 07/28/21 ?2811 07/29/21 ?0153  ?NA 140 138 140 141 142  ?K 4.3 3.9 3.8 3.4* 4.2  ?CL 103 105 104 106 108  ?CO2 28 27 21* 27 24  ?GLUCOSE 109* 95 146* 192* 253*  ?BUN 26* 24* 30* 45* 68*  ?CREATININE 1.29* 1.27* 1.39* 1.42* 1.52*  ?CALCIUM 8.8* 8.0* 8.1* 7.8* 8.3*  ?MG  --   --   --  2.1 2.4  ? ? ?GFR: ?Estimated Creatinine Clearance: 16 mL/min (A) (by C-G formula based on SCr of 1.52 mg/dL (H)). ? ?Liver Function Tests: ?Recent Labs  ?Lab 07/25/21 ?1820 07/26/21 ?0455 07/27/21 ?0304 07/28/21 ?8867 07/29/21 ?0153  ?AST 21 17 29 31  48*  ?ALT 11 10 17 19 23   ?ALKPHOS 36* 25* 31* 28* 32*  ?BILITOT 0.5 0.8 1.2 0.6 0.3  ?PROT 6.1* 5.0* 5.3* 5.2* 5.1*  ?ALBUMIN 3.4* 2.6* 2.8* 2.8* 2.9*  ? ?No results for input(s): LIPASE, AMYLASE in the last 168 hours. ?No results for input(s): AMMONIA in the last 168 hours. ? ?Coagulation Profile: ?Recent Labs  ?Lab  07/25/21 ?1820  ?INR 1.0  ? ? ?Cardiac Enzymes: ?No results for input(s): CKTOTAL, CKMB, CKMBINDEX, TROPONINI in the last 168 hours. ? ?BNP (last 3 results) ?No results for input(s): PROBNP in the last 8760 hour

## 2021-07-30 ENCOUNTER — Inpatient Hospital Stay (HOSPITAL_COMMUNITY): Payer: Medicare Other

## 2021-07-30 DIAGNOSIS — N179 Acute kidney failure, unspecified: Secondary | ICD-10-CM | POA: Diagnosis not present

## 2021-07-30 DIAGNOSIS — M0579 Rheumatoid arthritis with rheumatoid factor of multiple sites without organ or systems involvement: Secondary | ICD-10-CM | POA: Diagnosis not present

## 2021-07-30 DIAGNOSIS — J9601 Acute respiratory failure with hypoxia: Secondary | ICD-10-CM | POA: Diagnosis not present

## 2021-07-30 DIAGNOSIS — U071 COVID-19: Secondary | ICD-10-CM | POA: Diagnosis not present

## 2021-07-30 LAB — CBC WITH DIFFERENTIAL/PLATELET
Abs Immature Granulocytes: 0.13 10*3/uL — ABNORMAL HIGH (ref 0.00–0.07)
Basophils Absolute: 0 10*3/uL (ref 0.0–0.1)
Basophils Relative: 0 %
Eosinophils Absolute: 0 10*3/uL (ref 0.0–0.5)
Eosinophils Relative: 0 %
HCT: 33 % — ABNORMAL LOW (ref 36.0–46.0)
Hemoglobin: 10.3 g/dL — ABNORMAL LOW (ref 12.0–15.0)
Immature Granulocytes: 1 %
Lymphocytes Relative: 4 %
Lymphs Abs: 0.4 10*3/uL — ABNORMAL LOW (ref 0.7–4.0)
MCH: 32.2 pg (ref 26.0–34.0)
MCHC: 31.2 g/dL (ref 30.0–36.0)
MCV: 103.1 fL — ABNORMAL HIGH (ref 80.0–100.0)
Monocytes Absolute: 0.5 10*3/uL (ref 0.1–1.0)
Monocytes Relative: 5 %
Neutro Abs: 8.8 10*3/uL — ABNORMAL HIGH (ref 1.7–7.7)
Neutrophils Relative %: 90 %
Platelets: 135 10*3/uL — ABNORMAL LOW (ref 150–400)
RBC: 3.2 MIL/uL — ABNORMAL LOW (ref 3.87–5.11)
RDW: 14.1 % (ref 11.5–15.5)
WBC: 9.9 10*3/uL (ref 4.0–10.5)
nRBC: 0 % (ref 0.0–0.2)

## 2021-07-30 LAB — COMPREHENSIVE METABOLIC PANEL
ALT: 39 U/L (ref 0–44)
AST: 47 U/L — ABNORMAL HIGH (ref 15–41)
Albumin: 3 g/dL — ABNORMAL LOW (ref 3.5–5.0)
Alkaline Phosphatase: 34 U/L — ABNORMAL LOW (ref 38–126)
Anion gap: 8 (ref 5–15)
BUN: 62 mg/dL — ABNORMAL HIGH (ref 8–23)
CO2: 23 mmol/L (ref 22–32)
Calcium: 8.4 mg/dL — ABNORMAL LOW (ref 8.9–10.3)
Chloride: 110 mmol/L (ref 98–111)
Creatinine, Ser: 1.29 mg/dL — ABNORMAL HIGH (ref 0.44–1.00)
GFR, Estimated: 40 mL/min — ABNORMAL LOW (ref >60)
Glucose, Bld: 224 mg/dL — ABNORMAL HIGH (ref 70–99)
Potassium: 3.7 mmol/L (ref 3.5–5.1)
Sodium: 141 mmol/L (ref 135–145)
Total Bilirubin: 0.5 mg/dL (ref 0.3–1.2)
Total Protein: 5.3 g/dL — ABNORMAL LOW (ref 6.5–8.1)

## 2021-07-30 LAB — CULTURE, BLOOD (ROUTINE X 2)
Culture: NO GROWTH
Culture: NO GROWTH
Special Requests: ADEQUATE
Special Requests: ADEQUATE

## 2021-07-30 LAB — C-REACTIVE PROTEIN: CRP: 3.8 mg/dL — ABNORMAL HIGH (ref <1.0)

## 2021-07-30 LAB — MAGNESIUM: Magnesium: 2.6 mg/dL — ABNORMAL HIGH (ref 1.7–2.4)

## 2021-07-30 MED ORDER — ALPRAZOLAM 0.5 MG PO TABS
0.2500 mg | ORAL_TABLET | Freq: Two times a day (BID) | ORAL | Status: DC | PRN
  Administered 2021-07-30 – 2021-08-01 (×3): 0.25 mg via ORAL
  Filled 2021-07-30 (×4): qty 1

## 2021-07-30 MED ORDER — PREDNISONE 20 MG PO TABS: 20.0000 mg | ORAL_TABLET | Freq: Every day | ORAL | Status: DC

## 2021-07-30 NOTE — Progress Notes (Signed)
?      ?                 PROGRESS NOTE ? ?      ?PATIENT DETAILS ?Name: [name] ?Age: 88 y.o. ?Sex: female ?Date of Birth: 1930-04-14 ?Admit Date: 07/25/2021 ?Admitting Physician Orene Desanctis, DO ?WEX:HBZJ, [name], MD ? ?Brief Summary: ?Patient is a 88 y.o.  female with remote history of lung cancer, steroid-dependent RA, COPD, CAD-who presented with shortness of breath-she was thought to have sepsis with hypoxia due to COVID-19 infection and subsequently admitted to the hospitalist service.  See below for further details. ? ?Significant events: ?4/8>> admit to [facility] for sepsis/hypoxia due to COVID-19 infection. ? ?Significant studies: ?4/8>> CXR: No active disease-s/p posttreatment changes on the right lung. ?4/9>> CXR: Findings highly concerning for probable recurrence of right-sided lung cancer with postobstructive changes in the right upper lobe. ?4/13>> CT chest: Stable biapical scarring, stable opacity in the right upper lobe is consistent with posttherapy changes related to history of lung cancer.  Small right pleural effusion.  Right middle lobe opacity-concerning for atelectasis/PNA. ? ?Significant microbiology data: ?4/8>> COVID PCR: Positive ?4/8>> blood culture: Negative ?4/10>> stool C. difficile studies: Negative ? ?Procedures: ?None ? ?Consults: ?None  ? ?Subjective: ?Diarrhea better-very anxious-asking for dosage of prednisone to be reduced.  Inquiring about discharge plans. ? ?Objective: ?Vitals: ?Blood pressure (!) 119/39, pulse 73, temperature (!) 97.4 ?F (36.3 ?C), temperature source Oral, resp. rate 17, height 4\' 8"  (1.422 m), weight 46.3 kg, SpO2 92 %.  ? ?Exam: ?Gen Exam:Alert awake-not in any distress ?HEENT:atraumatic, normocephalic ?Chest: B/L clear to auscultation anteriorly ?CVS:S1S2 regular ?Abdomen:soft non tender, non distended ?Extremities:no edema ?Neurology: Non focal ?Skin: no rash  ? ?Pertinent Labs/Radiology: ? ?  Latest Ref Rng & Units 07/30/2021  ?  2:17 AM 07/29/2021  ?   1:53 AM 07/28/2021  ?  2:58 AM  ?CBC  ?WBC 4.0 - 10.5 K/uL 9.9   8.7   6.2    ?Hemoglobin 12.0 - 15.0 g/dL 10.3   10.7   11.0    ?Hematocrit 36.0 - 46.0 % 33.0   32.9   34.1    ?Platelets 150 - 400 K/uL 135   129   140    ?  ?Lab Results  ?Component Value Date  ? NA 141 07/30/2021  ? K 3.7 07/30/2021  ? CL 110 07/30/2021  ? CO2 23 07/30/2021  ? ?  ? ? ?Assessment/Plan: ?Sepsis: Due to COVID-19 infection/aspiration pneumonia-sepsis physiology has resolved with supportive care.   ? ?Acute hypoxic respiratory failure due to COVID-19 infection +/- superimposed bacterial pneumonia and COPD exacerbation: Significant improvement in hypoxemia-on minimal oxygen today.  Will complete Remdesivir today-remains on Rocephin/Zithromax.  Start tapering prednisone further.  CT chest does confirm right lower lobe infiltrate.   ? ?AKI: Mild-likely hemodynamically mediated kidney injury in the setting of diarrhea/sepsis.  Improving.  ? ?Diarrhea: C. difficile studies negative-could be due to COVID-19 infection.  Continue with as needed Imodium. ? ?Steroid-dependent RA: On 10 mg of prednisone as outpatient-plans to slowly taper down to her usual dosing. ? ?HTN: BP was soft-hence all antihypertensives on hold-resume when able. ? ?Anxiety: Due to acute illness-high dosing of steroids-we will try low-dose Xanax to see if this helps. ? ?Remote history of lung cancer: CT chest on 4/13-does not show any recurrence of malignancy. ? ?Debility/deconditioning: Due to acute illness-recommendations are for SNF on discharge. ? ?BMI: ?Estimated body mass index is 22.87 kg/m?  as calculated from the following: ?  Height as of this encounter: 4\' 8"  (1.422 m). ?  Weight as of this encounter: 46.3 kg.  ? ?Code status: ?  Code Status: DNR  ? ?DVT Prophylaxis: ?enoxaparin (LOVENOX) injection 30 mg Start: 07/26/21 1000 ?  ?Family Communication: Daughter-[name]-updated on 4/13. ? ? ?Disposition Plan: ?Status is: Inpatient ?Remains inpatient  appropriate because: Resolving sepsis/hypoxia-remains on IV antibiotics/Remdesivir-not yet stable for discharge. ?  ?Planned Discharge Destination:Skilled nursing facility ? ? ?Diet: ?Diet Order   ? ?       ?  Diet regular Room service appropriate? No; Fluid consistency: Thin  Diet effective now       ?  ? ?  ?  ? ?  ?  ? ? ?Antimicrobial agents: ?Anti-infectives (From admission, onward)  ? ? Start     Dose/Rate Route Frequency Ordered Stop  ? 07/27/21 1000  remdesivir 100 mg in sodium chloride 0.9 % 100 mL IVPB       ?See Hyperspace for full Linked Orders Report.  ? 100 mg ?200 mL/hr over 30 Minutes Intravenous Daily 07/26/21 1138 07/30/21 1050  ? 07/27/21 0930  cefTRIAXone (ROCEPHIN) 1 g in sodium chloride 0.9 % 100 mL IVPB       ? 1 g ?200 mL/hr over 30 Minutes Intravenous Every 24 hours 07/27/21 0835    ? 07/27/21 0930  azithromycin (ZITHROMAX) 500 mg in sodium chloride 0.9 % 250 mL IVPB       ? 500 mg ?250 mL/hr over 60 Minutes Intravenous Every 24 hours 07/27/21 0835 08/01/21 0929  ? 07/26/21 2200  molnupiravir EUA (LAGEVRIO) capsule 800 mg  Status:  Discontinued       ? 4 capsule Oral 2 times daily 07/26/21 0955 07/26/21 1139  ? 07/26/21 1230  remdesivir 200 mg in sodium chloride 0.9% 250 mL IVPB       ?See Hyperspace for full Linked Orders Report.  ? 200 mg ?580 mL/hr over 30 Minutes Intravenous Once 07/26/21 1138 07/26/21 1349  ? 07/26/21 1045  molnupiravir EUA (LAGEVRIO) capsule 800 mg  Status:  Discontinued       ? 4 capsule Oral 2 times daily 07/26/21 0945 07/26/21 0955  ? 07/25/21 2359  nirmatrelvir/ritonavir EUA (renal dosing) (PAXLOVID) 2 tablet  Status:  Discontinued       ? 2 tablet Oral 2 times daily 07/25/21 2303 07/26/21 0945  ? 07/25/21 2300  nirmatrelvir/ritonavir EUA (PAXLOVID) 3 tablet  Status:  Discontinued       ? 3 tablet Oral 2 times daily 07/25/21 2259 07/25/21 2303  ? 07/25/21 1915  cefTRIAXone (ROCEPHIN) 2 g in sodium chloride 0.9 % 100 mL IVPB  Status:  Discontinued       ? 2 g ?200  mL/hr over 30 Minutes Intravenous Every 24 hours 07/25/21 1901 07/25/21 2259  ? 07/25/21 1915  doxycycline (VIBRAMYCIN) 100 mg in sodium chloride 0.9 % 250 mL IVPB  Status:  Discontinued       ? 100 mg ?125 mL/hr over 120 Minutes Intravenous Every 12 hours 07/25/21 1901 07/25/21 2259  ? ?  ? ? ? ?MEDICATIONS: ?Scheduled Meds: ? enoxaparin (LOVENOX) injection  30 mg Subcutaneous Daily  ? Ipratropium-Albuterol  1 puff Inhalation BID  ? mometasone-formoterol  2 puff Inhalation BID  ? [START ON 07/31/2021] predniSONE  20 mg Oral Q breakfast  ? ?Continuous Infusions: ? azithromycin 500 mg (07/30/21 1050)  ? cefTRIAXone (ROCEPHIN)  IV 1 g (07/30/21 1015)  ? ?PRN  Meds:.acetaminophen, albuterol, ALPRAZolam, dextromethorphan-guaiFENesin, hydrALAZINE, loperamide, ondansetron (ZOFRAN) IV, senna-docusate, umeclidinium-vilanterol ? ? ?I have personally reviewed following labs and imaging studies ? ?LABORATORY DATA: ?CBC: ?Recent Labs  ?Lab 07/26/21 ?4825 07/27/21 ?0304 07/28/21 ?0258 07/29/21 ?0153 07/30/21 ?0217  ?WBC 5.9 3.9* 6.2 8.7 9.9  ?NEUTROABS 3.6 3.2 5.7 8.1* 8.8*  ?HGB 10.5* 12.0 11.0* 10.7* 10.3*  ?HCT 32.6* 37.4 34.1* 32.9* 33.0*  ?MCV 104.2* 101.4* 99.7 102.2* 103.1*  ?PLT 146* 121* 140* 129* 135*  ? ? ? ?Basic Metabolic Panel: ?Recent Labs  ?Lab 07/26/21 ?0037 07/27/21 ?0304 07/28/21 ?0258 07/29/21 ?0153 07/30/21 ?0217  ?NA 138 140 141 142 141  ?K 3.9 3.8 3.4* 4.2 3.7  ?CL 105 104 106 108 110  ?CO2 27 21* 27 24 23   ?GLUCOSE 95 146* 192* 253* 224*  ?BUN 24* 30* 45* 68* 62*  ?CREATININE 1.27* 1.39* 1.42* 1.52* 1.29*  ?CALCIUM 8.0* 8.1* 7.8* 8.3* 8.4*  ?MG  --   --  2.1 2.4 2.6*  ? ? ? ?GFR: ?Estimated Creatinine Clearance: 18.8 mL/min (A) (by C-G formula based on SCr of 1.29 mg/dL (H)). ? ?Liver Function Tests: ?Recent Labs  ?Lab 07/26/21 ?0488 07/27/21 ?0304 07/28/21 ?0258 07/29/21 ?0153 07/30/21 ?0217  ?AST 17 29 31  48* 47*  ?ALT 10 17 19 23  39  ?ALKPHOS 25* 31* 28* 32* 34*  ?BILITOT 0.8 1.2 0.6 0.3 0.5  ?PROT 5.0*  5.3* 5.2* 5.1* 5.3*  ?ALBUMIN 2.6* 2.8* 2.8* 2.9* 3.0*  ? ? ?No results for input(s): LIPASE, AMYLASE in the last 168 hours. ?No results for input(s): AMMONIA in the last 168 hours. ? ?Coagulation Profile: ?Rece

## 2021-07-30 NOTE — Progress Notes (Signed)
Physical Therapy Treatment ?Patient Details ?Name: [name] ?MRN: [id number] ?DOB: 1930-04-02 ?Today's Date: 07/30/2021 ? ? ?History of Present Illness Pt is a 88 yr old female who presented 4/8 from ILF  due to SOB. Pt found to be COVID +, sepsis due to bacterial pneumonia and UTI. PMH: RA, lung ca, COPD, CAD, HOH, hernia, R hip pain ? ?  ?PT Comments  ? ? Patient slightly less anxious than prior visit. Taking her time with lines, tubes and waiting for instructions to move when PT ready. No physical assist needed for bed mobility, but guarding assist for transfer due to unsafe technique.  ?   ?Recommendations for follow up therapy are one component of a multi-disciplinary discharge planning process, led by the attending physician.  Recommendations may be updated based on patient status, additional functional criteria and insurance authorization. ? ?Follow Up Recommendations ? Skilled nursing-short term rehab (<3 hours/day) (unless 24/7 aide can be arranged at [facility]) ?  ?  ?Assistance Recommended at Discharge Frequent or constant Supervision/Assistance  ?Patient can return home with the following A little help with walking and/or transfers;Assistance with cooking/housework ?  ?Equipment Recommendations ? None recommended by PT  ?  ?Recommendations for Other Services   ? ? ?  ?Precautions / Restrictions Precautions ?Precautions: Fall ?Precaution Comments: HOH and anxiety ?Restrictions ?Weight Bearing Restrictions: No  ?  ? ?Mobility ? Bed Mobility ?Overal bed mobility: Needs Assistance ?Bed Mobility: Supine to Sit, Sit to Supine ?  ?  ?Supine to sit: Supervision ?Sit to supine: Supervision ?  ?General bed mobility comments: incr time but no physical assist needed ?  ? ?Transfers ?Overall transfer level: Needs assistance ?Equipment used: Rolling walker (2 wheels), None ?Transfers: Sit to/from Stand ?Sit to Stand: Min guard ?  ?  ?  ?  ?  ?General transfer comment: from chair pt insists on pulling up on RW and  requires multiple attempts (she is used to using a rollator) ?  ? ?Ambulation/Gait ?Ambulation/Gait assistance: Min guard ?Gait Distance (Feet): 60 Feet ?Assistive device: Rolling walker (2 wheels) ?Gait Pattern/deviations: Step-through pattern, Decreased stride length, Shuffle, Wide base of support ?Gait velocity: decr ?  ?  ?General Gait Details: pt prefers to wear left slipper due to callouses on bottom of foot and drags/slides left foot ? ? ?Stairs ?  ?  ?  ?  ?  ? ? ?Wheelchair Mobility ?  ? ?Modified Rankin (Stroke Patients Only) ?  ? ? ?  ?Balance Overall balance assessment: Needs assistance ?Sitting-balance support: Feet supported ?Sitting balance-Leahy Scale: Fair ?  ?  ?Standing balance support: Reliant on assistive device for balance, Single extremity supported ?Standing balance-Leahy Scale: Poor ?Standing balance comment: able to let go with one hand and reaching for objects on counter she wanted ?  ?  ?  ?  ?  ?  ?  ?  ?  ?  ?  ?  ? ?  ?Cognition Arousal/Alertness: Awake/alert ?Behavior During Therapy: Anxious ?Overall Cognitive Status: Difficult to assess ?  ?  ?  ?  ?  ?  ?  ?  ?  ?  ?  ?  ?  ?  ?  ?  ?General Comments: patient is very hard of hearing ?  ?  ? ?  ?Exercises   ? ?  ?General Comments General comments (skin integrity, edema, etc.): on 1L with sats 90-94% ?  ?  ? ?Pertinent Vitals/Pain Pain Assessment ?Pain Assessment: No/denies pain  ? ? ?Home  Living   ?  ?  ?  ?  ?  ?  ?  ?  ?  ?   ?  ?Prior Function    ?  ?  ?   ? ?PT Goals (current goals can now be found in the care plan section) Acute Rehab PT Goals ?Patient Stated Goal: wants to be independent ?PT Goal Formulation: With patient ?Time For Goal Achievement: 08/11/21 ?Potential to Achieve Goals: Good ?Progress towards PT goals: Progressing toward goals ? ?  ?Frequency ? ? ? Min 3X/week (unless SNF confirmed) ? ? ? ?  ?PT Plan Current plan remains appropriate  ? ? ?Co-evaluation   ?  ?  ?  ?  ? ?  ?AM-PAC PT "6 Clicks" Mobility    ?Outcome Measure ? Help needed turning from your back to your side while in a flat bed without using bedrails?: A Little ?Help needed moving from lying on your back to sitting on the side of a flat bed without using bedrails?: A Little ?Help needed moving to and from a bed to a chair (including a wheelchair)?: A Little ?Help needed standing up from a chair using your arms (e.g., wheelchair or bedside chair)?: A Little ?Help needed to walk in hospital room?: A Little ?Help needed climbing 3-5 steps with a railing? : A Little ?6 Click Score: 18 ? ?  ?End of Session Equipment Utilized During Treatment: Oxygen ?Activity Tolerance: Patient tolerated treatment well ?Patient left: with call bell/phone within reach;in bed;with bed alarm set ?Nurse Communication: Mobility status ?PT Visit Diagnosis: Unsteadiness on feet (R26.81);Difficulty in walking, not elsewhere classified (R26.2) ?  ? ? ?Time: 9735-3299 ?PT Time Calculation (min) (ACUTE ONLY): 23 min ? ?Charges:  $Gait Training: 23-37 mins          ?          ? ? ?Arby Barrette, PT ?Acute Rehabilitation Services  ?Pager [phone] ?Office [phone] ? ? ? ?Jeanie Cooks Nicholaus Steinke ?07/30/2021, 2:47 PM ? ?

## 2021-07-30 NOTE — Plan of Care (Signed)

## 2021-07-30 NOTE — Progress Notes (Addendum)
Sent Dr. Sloan Leiter secure chat and made MD aware that patient is becoming increasingly anxious and states she is afraid to be here and wants to go home. Patient does not want to take 40 mg of prednisone stating it's too much and it's making her throat sore and dry. Patient allergic to chloroseptic spray and throat lozenges so unable to order per MD. MD reduced dose of prednisone and ordered xanax. RN spoke with patient's daughter [name] on the phone and informed her that MD decreased prednisone dose. RN also informed patient that prednisone dose was decreased by the doctor. Patient has spoke on the phone with both her daughters while RN was present in the room over the past hour with patient.  ?

## 2021-07-30 NOTE — Progress Notes (Deleted)
Office Visit Note  Patient: [name]             Date of Birth: January 16, 1930           MRN: [id number]             PCP: Mayra Neer, MD Referring: Mayra Neer, MD Visit Date: 08/13/2021 Occupation: @GUAROCC @  Subjective:  No chief complaint on file.   History of Present Illness: [name] is a 88 y.o. female ***   Hospitalized   Activities of Daily Living:  Patient reports morning stiffness for *** {minute/hour:19697}.   Patient {ACTIONS;DENIES/REPORTS:21021675::"Denies"} nocturnal pain.  Difficulty dressing/grooming: {ACTIONS;DENIES/REPORTS:21021675::"Denies"} Difficulty climbing stairs: {ACTIONS;DENIES/REPORTS:21021675::"Denies"} Difficulty getting out of chair: {ACTIONS;DENIES/REPORTS:21021675::"Denies"} Difficulty using hands for taps, buttons, cutlery, and/or writing: {ACTIONS;DENIES/REPORTS:21021675::"Denies"}  No Rheumatology ROS completed.   PMFS History:  Patient Active Problem List   Diagnosis Date Noted   COVID-19 virus infection 07/25/2021   Sepsis ([place]) 07/25/2021   AKI (acute kidney injury) ([place]) 07/25/2021   Hypotension 07/25/2021   Plantar wart 04/21/2021   Spinal stenosis 04/21/2021   Recurrent major depression in remission ([place]) 02/19/2021   Acute non-ST segment elevation myocardial infarction [facility]) 04/01/2020   Ataxia 04/01/2020   Frail elderly 04/01/2020   Heart disease 04/01/2020   Hypercholesterolemia 04/01/2020   Insomnia 04/01/2020   Ovarian mass 04/01/2020   Malignant neoplasm of lung ([place]) 04/01/2020   Early stage nonexudative age-related macular degeneration of both eyes 02/26/2020   Posterior vitreous detachment of both eyes 02/26/2020   Abnormal CT of the chest 09/01/2017   Adult failure to thrive 09/01/2017   Neurocognitive disorder 08/30/2017   NSTEMI (non-ST elevated myocardial infarction) ([place]) 08/25/2017   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 Secondary to either irritation by dentures, vs. secondary to lung neoplasm 12/23/2012   Rheumatoid arthritis-severe 12/23/2012   COPD, mild ([place]) 12/23/2012   Osteoporosis 12/23/2012   Primary cancer of right upper lobe of lung ([place]) 03/21/2012    Past Medical History:  Diagnosis Date   Cancer ([place])    lung ca   COPD (chronic obstructive pulmonary disease) (HCC)    Hernia, umbilical    Hip pain, right    History of radiation therapy 05/02/12-05/04/12,&05/09/12   rul lung 54Gy/27fx   HLD (hyperlipidemia)    Lung mass    Myocardial infarction ([place]) 08/2017   Osteoporosis    PAC (premature atrial contraction)    RA (rheumatoid arthritis) ([place])    Shingles     Family History  Problem Relation Age of Onset   Cancer Daughter    Cancer Mother    Past Surgical History:  Procedure Laterality Date   LEFT HEART CATH AND CORONARY ANGIOGRAPHY N/A 08/26/2017   Procedure: LEFT HEART CATH AND CORONARY ANGIOGRAPHY;  Surgeon: Martinique, Peter M, MD;  Location: [facility];  Service: Cardiovascular;  Laterality: N/A;   right lobectomy     Social History   Social History Narrative   Lives in ALF at [facility].  08/2017 resident of [facility].    01-19-18 Unable to ask abuse questions daughter with her today.   Immunization History  Administered Date(s) Administered   Influenza Split 02/25/1998, 02/11/2001, 02/10/2002, 03/02/2003, 01/30/2005, 01/17/2006, 01/23/2007, 02/02/2008,  01/18/2011, 02/09/2012   Influenza Whole 02/09/2012   Influenza-Unspecified 01/17/2017   Moderna Sars-Covid-2 Vaccination 11/28/2019, 12/27/2019   Pneumococcal Polysaccharide-23 02/25/1998, 12/27/2003   Pneumococcal-Unspecified 04/19/2014   Td 08/05/1998, 03/03/2009     Objective: Vital Signs: There were no vitals taken for this visit.   Physical Exam   Musculoskeletal Exam: ***  CDAI Exam: CDAI Score: -- Patient Global: --; Provider Global: -- Swollen: --; Tender: -- Joint Exam 08/13/2021   No joint exam has been documented for this visit   There is currently no information documented on the homunculus. Go to the Rheumatology activity and complete the homunculus joint exam.  Investigation: No additional findings.  Imaging: DG Chest Port 1 View  Result Date: 07/26/2021 CLINICAL DATA:  88 year old female with history of acute respiratory distress. History of COPD. EXAM: PORTABLE CHEST 1 VIEW COMPARISON:  Chest x-ray 07/25/2021. FINDINGS: Increasing mass-like appearance of the right hilar region with worsening consolidation in the periphery of the right mid to upper lung. Left lung is clear. Possible small right pleural effusion. No left pleural effusion. No pneumothorax. No evidence of pulmonary edema. Heart size is borderline enlarged. Upper mediastinal contours are distorted by patient's rotation. Atherosclerotic calcifications are noted in the thoracic aorta. IMPRESSION: 1. Findings are highly concerning for probable recurrence of right-sided lung cancer with postobstructive changes in the right upper lobe, as above, and small right pleural effusion. Further evaluation with contrast enhanced chest CT is strongly recommended at this time. Electronically Signed   By: Vinnie Langton M.D.   On: 07/26/2021 12:12   DG Chest Portable 1 View  Result Date: 07/25/2021 CLINICAL DATA:  Shortness of breath and fever EXAM: PORTABLE CHEST 1 VIEW COMPARISON:  08/25/2017, CT 03/27/2020 FINDINGS:  Similar post treatment changes of the right chest with volume loss and right upper lobe parenchymal scarring. Retraction of right hilus as before. No acute airspace disease. Stable cardiac size. IMPRESSION: No active disease.  Similar post treatment changes on the right Electronically Signed   By: Donavan Foil M.D.   On: 07/25/2021 19:05    Recent Labs: Lab Results  Component Value Date   WBC 9.9 07/30/2021   HGB 10.3 (L) 07/30/2021   PLT 135 (L) 07/30/2021   NA 141 07/30/2021   K 3.7 07/30/2021   CL 110 07/30/2021   CO2 23 07/30/2021   GLUCOSE 224 (H) 07/30/2021   BUN 62 (H) 07/30/2021   CREATININE 1.29 (H) 07/30/2021   BILITOT 0.5 07/30/2021   ALKPHOS 34 (L) 07/30/2021   AST 47 (H) 07/30/2021   ALT 39 07/30/2021   PROT 5.3 (L) 07/30/2021   ALBUMIN 3.0 (L) 07/30/2021   CALCIUM 8.4 (L) 07/30/2021   GFRAA 51 (L) 01/18/2018    Speciality Comments: No specialty comments available.  Procedures:  No procedures performed Allergies: Latex, Mouthwashes, Darvocet [propoxyphene n-acetaminophen], Erythromycin, Hydrocodone-acetaminophen, Ibandronic acid, Morphine and related, Oxycodone hcl, Oxycontin [oxycodone hcl], Sulfa antibiotics, and Tramadol   Assessment / Plan:     Visit Diagnoses: Rheumatoid arthritis of multiple sites with negative rheumatoid factor (HCC)  Rheumatoid nodulosis (HCC)  High risk medication use  Primary osteoarthritis of both hands  Primary osteoarthritis of both feet  Age-related osteoporosis without current pathological fracture  History of lung cancer  NSTEMI (non-ST elevated myocardial infarction) (  [place])  Aortic calcification ([place])  History of COPD  Orders: No orders of the defined types were placed in this encounter.  No orders of the defined types were placed in this encounter.   Face-to-face time spent with patient was *** minutes. Greater than 50% of time was spent in counseling and coordination of care.  Follow-Up Instructions: No  follow-ups on file.   Ofilia Neas, PA-C  Note - This record has been created using Dragon software.  Chart creation errors have been sought, but may not always  have been located. Such creation errors do not reflect on  the standard of medical care.

## 2021-07-31 DIAGNOSIS — M0579 Rheumatoid arthritis with rheumatoid factor of multiple sites without organ or systems involvement: Secondary | ICD-10-CM | POA: Diagnosis not present

## 2021-07-31 DIAGNOSIS — J9601 Acute respiratory failure with hypoxia: Secondary | ICD-10-CM | POA: Diagnosis not present

## 2021-07-31 DIAGNOSIS — U071 COVID-19: Secondary | ICD-10-CM | POA: Diagnosis not present

## 2021-07-31 DIAGNOSIS — N179 Acute kidney failure, unspecified: Secondary | ICD-10-CM | POA: Diagnosis not present

## 2021-07-31 LAB — BASIC METABOLIC PANEL
Anion gap: 6 (ref 5–15)
BUN: 52 mg/dL — ABNORMAL HIGH (ref 8–23)
CO2: 25 mmol/L (ref 22–32)
Calcium: 8 mg/dL — ABNORMAL LOW (ref 8.9–10.3)
Chloride: 108 mmol/L (ref 98–111)
Creatinine, Ser: 1.3 mg/dL — ABNORMAL HIGH (ref 0.44–1.00)
GFR, Estimated: 39 mL/min — ABNORMAL LOW (ref >60)
Glucose, Bld: 157 mg/dL — ABNORMAL HIGH (ref 70–99)
Potassium: 4.8 mmol/L (ref 3.5–5.1)
Sodium: 139 mmol/L (ref 135–145)

## 2021-07-31 LAB — MAGNESIUM: Magnesium: 2.6 mg/dL — ABNORMAL HIGH (ref 1.7–2.4)

## 2021-07-31 LAB — C-REACTIVE PROTEIN: CRP: 1.9 mg/dL — ABNORMAL HIGH (ref <1.0)

## 2021-07-31 MED ORDER — PREDNISONE 5 MG PO TABS
10.0000 mg | ORAL_TABLET | Freq: Every day | ORAL | Status: DC
  Administered 2021-07-31 – 2021-08-06 (×7): 10 mg via ORAL
  Filled 2021-07-31 (×7): qty 2

## 2021-07-31 NOTE — Progress Notes (Signed)
?      ?                 PROGRESS NOTE ? ?      ?PATIENT DETAILS ?Name: [name] ?Age: 88 y.o. ?Sex: female ?Date of Birth: 06-Jun-1929 ?Admit Date: 07/25/2021 ?Admitting Physician Orene Desanctis, DO ?QPR:FFMB, [name], MD ? ?Brief Summary: ?Patient is a 88 y.o.  female with remote history of lung cancer, steroid-dependent RA, COPD, CAD-who presented with shortness of breath-she was thought to have sepsis with hypoxia due to COVID-19 infection and subsequently admitted to the hospitalist service.  See below for further details. ? ?Significant events: ?4/8>> admit to [facility] for sepsis/hypoxia due to COVID-19 infection. ? ?Significant studies: ?4/8>> CXR: No active disease-s/p posttreatment changes on the right lung. ?4/9>> CXR: Findings highly concerning for probable recurrence of right-sided lung cancer with postobstructive changes in the right upper lobe. ?4/13>> CT chest: Stable biapical scarring, stable opacity in the right upper lobe is consistent with posttherapy changes related to history of lung cancer.  Small right pleural effusion.  Right middle lobe opacity-concerning for atelectasis/PNA. ? ?Significant microbiology data: ?4/8>> COVID PCR: Positive ?4/8>> blood culture: Negative ?4/10>> stool C. difficile studies: Negative ? ?Procedures: ?None ? ?Consults: ?None  ? ?Subjective: ?Claims she did not have a BM yesterday-anxiety much better.  Understands that she needs to go to SNF. ? ?Objective: ?Vitals: ?Blood pressure (!) 128/47, pulse 78, temperature 98.1 ?F (36.7 ?C), temperature source Oral, resp. rate 17, height 4\' 8"  (1.422 m), weight 46.3 kg, SpO2 90 %.  ? ?Exam: ?Gen Exam:Alert awake-not in any distress ?HEENT:atraumatic, normocephalic ?Chest: B/L clear to auscultation anteriorly ?CVS:S1S2 regular ?Abdomen:soft non tender, non distended ?Extremities:no edema ?Neurology: Non focal ?Skin: no rash  ? ?Pertinent Labs/Radiology: ? ?  Latest Ref Rng & Units 07/30/2021  ?  2:17 AM 07/29/2021  ?  1:53 AM  07/28/2021  ?  2:58 AM  ?CBC  ?WBC 4.0 - 10.5 K/uL 9.9   8.7   6.2    ?Hemoglobin 12.0 - 15.0 g/dL 10.3   10.7   11.0    ?Hematocrit 36.0 - 46.0 % 33.0   32.9   34.1    ?Platelets 150 - 400 K/uL 135   129   140    ?  ?Lab Results  ?Component Value Date  ? NA 139 07/31/2021  ? K 4.8 07/31/2021  ? CL 108 07/31/2021  ? CO2 25 07/31/2021  ? ?  ? ? ?Assessment/Plan: ?Sepsis: Due to COVID-19 infection/aspiration pneumonia-sepsis physiology has resolved with supportive care.   ? ?Acute hypoxic respiratory failure due to COVID-19 infection +/- superimposed bacterial pneumonia and COPD exacerbation: Significant improvement in hypoxemia-was on room air this morning.  Has completed 5 days of Remdesivir-and 5 days of Rocephin/Zithromax.  Steroid dosage has been tapered down to her usual dosing of prednisone.  Continue supportive care.   ? ?AKI: Mild-likely hemodynamically mediated kidney injury in the setting of diarrhea/sepsis.  Improving.  ? ?Diarrhea: C. difficile studies negative-could be due to COVID-19 infection.  Continue with as needed Imodium. ? ?Steroid-dependent RA: Back on usual dosing of 10 mg of prednisone. ? ?HTN: BP was soft-hence all antihypertensives on hold-resume when able. ? ?Anxiety: Due to acute illness-escalating doses of steroids-low-dose as needed Xanax started which seems to help.   ? ?Remote history of lung cancer: CT chest on 4/13-does not show any recurrence of malignancy. ? ?Debility/deconditioning: Due to acute illness-recommendations are for SNF on discharge. ? ?BMI: ?  Estimated body mass index is 22.87 kg/m? as calculated from the following: ?  Height as of this encounter: 4\' 8"  (1.422 m). ?  Weight as of this encounter: 46.3 kg.  ? ?Code status: ?  Code Status: DNR  ? ?DVT Prophylaxis: ?enoxaparin (LOVENOX) injection 30 mg Start: 07/26/21 1000 ?  ?Family Communication: Daughter-[name]-[phone]-updated on 4/13. ? ? ?Disposition Plan: ?Status is: Inpatient ?Remains inpatient appropriate  because: Resolving sepsis/hypoxia-remains on IV antibiotics/Remdesivir-not yet stable for discharge. ?  ?Planned Discharge Destination:Skilled nursing facility ? ? ?Diet: ?Diet Order   ? ?       ?  Diet regular Room service appropriate? No; Fluid consistency: Thin  Diet effective now       ?  ? ?  ?  ? ?  ?  ? ? ?Antimicrobial agents: ?Anti-infectives (From admission, onward)  ? ? Start     Dose/Rate Route Frequency Ordered Stop  ? 07/27/21 1000  remdesivir 100 mg in sodium chloride 0.9 % 100 mL IVPB       ?See Hyperspace for full Linked Orders Report.  ? 100 mg ?200 mL/hr over 30 Minutes Intravenous Daily 07/26/21 1138 07/30/21 1050  ? 07/27/21 0930  cefTRIAXone (ROCEPHIN) 1 g in sodium chloride 0.9 % 100 mL IVPB       ? 1 g ?200 mL/hr over 30 Minutes Intravenous Every 24 hours 07/27/21 0835    ? 07/27/21 0930  azithromycin (ZITHROMAX) 500 mg in sodium chloride 0.9 % 250 mL IVPB       ? 500 mg ?250 mL/hr over 60 Minutes Intravenous Every 24 hours 07/27/21 0835 07/31/21 1241  ? 07/26/21 2200  molnupiravir EUA (LAGEVRIO) capsule 800 mg  Status:  Discontinued       ? 4 capsule Oral 2 times daily 07/26/21 0955 07/26/21 1139  ? 07/26/21 1230  remdesivir 200 mg in sodium chloride 0.9% 250 mL IVPB       ?See Hyperspace for full Linked Orders Report.  ? 200 mg ?580 mL/hr over 30 Minutes Intravenous Once 07/26/21 1138 07/26/21 1349  ? 07/26/21 1045  molnupiravir EUA (LAGEVRIO) capsule 800 mg  Status:  Discontinued       ? 4 capsule Oral 2 times daily 07/26/21 0945 07/26/21 0955  ? 07/25/21 2359  nirmatrelvir/ritonavir EUA (renal dosing) (PAXLOVID) 2 tablet  Status:  Discontinued       ? 2 tablet Oral 2 times daily 07/25/21 2303 07/26/21 0945  ? 07/25/21 2300  nirmatrelvir/ritonavir EUA (PAXLOVID) 3 tablet  Status:  Discontinued       ? 3 tablet Oral 2 times daily 07/25/21 2259 07/25/21 2303  ? 07/25/21 1915  cefTRIAXone (ROCEPHIN) 2 g in sodium chloride 0.9 % 100 mL IVPB  Status:  Discontinued       ? 2 g ?200 mL/hr over 30  Minutes Intravenous Every 24 hours 07/25/21 1901 07/25/21 2259  ? 07/25/21 1915  doxycycline (VIBRAMYCIN) 100 mg in sodium chloride 0.9 % 250 mL IVPB  Status:  Discontinued       ? 100 mg ?125 mL/hr over 120 Minutes Intravenous Every 12 hours 07/25/21 1901 07/25/21 2259  ? ?  ? ? ? ?MEDICATIONS: ?Scheduled Meds: ? enoxaparin (LOVENOX) injection  30 mg Subcutaneous Daily  ? Ipratropium-Albuterol  1 puff Inhalation BID  ? mometasone-formoterol  2 puff Inhalation BID  ? predniSONE  10 mg Oral Q breakfast  ? ?Continuous Infusions: ? cefTRIAXone (ROCEPHIN)  IV 1 g (07/31/21 0914)  ? ?PRN Meds:.acetaminophen, albuterol, ALPRAZolam,  dextromethorphan-guaiFENesin, hydrALAZINE, loperamide, ondansetron (ZOFRAN) IV, senna-docusate, umeclidinium-vilanterol ? ? ?I have personally reviewed following labs and imaging studies ? ?LABORATORY DATA: ?CBC: ?Recent Labs  ?Lab 07/26/21 ?7078 07/27/21 ?0304 07/28/21 ?0258 07/29/21 ?0153 07/30/21 ?0217  ?WBC 5.9 3.9* 6.2 8.7 9.9  ?NEUTROABS 3.6 3.2 5.7 8.1* 8.8*  ?HGB 10.5* 12.0 11.0* 10.7* 10.3*  ?HCT 32.6* 37.4 34.1* 32.9* 33.0*  ?MCV 104.2* 101.4* 99.7 102.2* 103.1*  ?PLT 146* 121* 140* 129* 135*  ? ? ? ?Basic Metabolic Panel: ?Recent Labs  ?Lab 07/27/21 ?0304 07/28/21 ?6754 07/29/21 ?0153 07/30/21 ?4920 07/31/21 ?0231  ?NA 140 141 142 141 139  ?K 3.8 3.4* 4.2 3.7 4.8  ?CL 104 106 108 110 108  ?CO2 21* 27 24 23 25   ?GLUCOSE 146* 192* 253* 224* 157*  ?BUN 30* 45* 68* 62* 52*  ?CREATININE 1.39* 1.42* 1.52* 1.29* 1.30*  ?CALCIUM 8.1* 7.8* 8.3* 8.4* 8.0*  ?MG  --  2.1 2.4 2.6* 2.6*  ? ? ? ?GFR: ?Estimated Creatinine Clearance: 18.7 mL/min (A) (by C-G formula based on SCr of 1.3 mg/dL (H)). ? ?Liver Function Tests: ?Recent Labs  ?Lab 07/26/21 ?1007 07/27/21 ?0304 07/28/21 ?0258 07/29/21 ?0153 07/30/21 ?0217  ?AST 17 29 31  48* 47*  ?ALT 10 17 19 23  39  ?ALKPHOS 25* 31* 28* 32* 34*  ?BILITOT 0.8 1.2 0.6 0.3 0.5  ?PROT 5.0* 5.3* 5.2* 5.1* 5.3*  ?ALBUMIN 2.6* 2.8* 2.8* 2.9* 3.0*  ? ? ?No results  for input(s): LIPASE, AMYLASE in the last 168 hours. ?No results for input(s): AMMONIA in the last 168 hours. ? ?Coagulation Profile: ?Recent Labs  ?Lab 07/25/21 ?1820  ?INR 1.0  ? ? ? ?Cardiac Enzymes: ?No results

## 2021-07-31 NOTE — Progress Notes (Signed)
Occupational Therapy Treatment ?Patient Details ?Name: [name] ?MRN: [id number] ?DOB: [birth year] ?Today's Date: 07/31/2021 ? ? ?History of present illness Pt is a 88 yr old female who presented 4/8 from ILF  due to SOB. Pt found to be COVID +, sepsis due to bacterial pneumonia and UTI. PMH: RA, lung ca, COPD, CAD, HOH, hernia, R hip pain ?  ?OT comments ? Pt. Seen for skilled OT treatment.  Pt. Able to complete bed mobility mod I.  Min guard a for transfer to/from 3n1.  Min a for [employer] and Probation officer.  Pt. Progressing well.  Current d/c recommendations remain appropriate.   ? ?Recommendations for follow up therapy are one component of a multi-disciplinary discharge planning process, led by the attending physician.  Recommendations may be updated based on patient status, additional functional criteria and insurance authorization. ?   ?Follow Up Recommendations ? Skilled nursing-short term rehab (<3 hours/day)  ?  ?Assistance Recommended at Discharge Frequent or constant Supervision/Assistance  ?Patient can return home with the following ? A lot of help with walking and/or transfers;A lot of help with bathing/dressing/bathroom;Assistance with cooking/housework;Assistance with feeding;Direct supervision/assist for medications management;Direct supervision/assist for financial management;Assist for transportation ?  ?Equipment Recommendations ?    ?  ?Recommendations for Other Services   ? ?  ?Precautions / Restrictions Precautions ?Precautions: Fall ?Precaution Comments: HOH and anxiety  ? ? ?  ? ?Mobility Bed Mobility ?Overal bed mobility: Modified Independent ?Bed Mobility: Supine to Sit, Sit to Supine ?  ?  ?Supine to sit: Modified independent (Device/Increase time) ?Sit to supine: Modified independent (Device/Increase time) ?  ?General bed mobility comments: pt. able to sit in long sitting and scoot all the way to hob with hob flat and no physical assistance, no assistance for eob/ either except for  slowing pace of task and allowing for tube/line management ?  ? ?Transfers ?Overall transfer level: Needs assistance ?Equipment used: 1 person hand held assist ?Transfers: Bed to chair/wheelchair/BSC ?  ?  ?Squat pivot transfers: Min guard ?  ?  ?  ?General transfer comment: had her reach for bsc arm rest for pivot to/from bed and same for returning bed, utilized bed rail for ue support ?  ?  ?Balance   ?  ?  ?  ?  ?  ?  ?  ?  ?  ?  ?  ?  ?  ?  ?  ?  ?  ?  ?   ? ?ADL either performed or assessed with clinical judgement  ? ?ADL Overall ADL's : Needs assistance/impaired ?  ?  ?  ?  ?  ?  ?  ?  ?  ?  ?Lower Body Dressing: Set up;Sitting/lateral leans;Minimal assistance;Sit to/from stand ?Lower Body Dressing Details (indicate cue type and reason): set up to don slipper seated eob, min a to pull up under garments in standing ?Toilet Transfer: Min guard;Squat-pivot;BSC/3in1;Cueing for sequencing ?  ?Toileting- Water quality scientist and Hygiene: Min guard;Sit to/from stand ?  ?  ?  ?Functional mobility during ADLs: Min guard ?  ?  ? ?Extremity/Trunk Assessment   ?  ?  ?  ?  ?  ? ?Vision   ?  ?  ?Perception   ?  ?Praxis   ?  ? ?Cognition Arousal/Alertness: Awake/alert ?Behavior During Therapy: Anxious ?Overall Cognitive Status: Difficult to assess ?  ?  ?  ?  ?  ?  ?  ?  ?  ?  ?  ?  ?  ?  ?  ?  ?  General Comments: patient is very hard of hearing ?  ?  ?   ?Exercises   ? ?  ?Shoulder Instructions   ? ? ?  ?General Comments    ? ? ?Pertinent Vitals/ Pain       Pain Assessment ?Pain Assessment: No/denies pain ? ?Home Living   ?  ?  ?  ?  ?  ?  ?  ?  ?  ?  ?  ?  ?  ?  ?  ?  ?  ?  ? ?  ?Prior Functioning/Environment    ?  ?  ?  ?   ? ?Frequency ? Min 2X/week  ? ? ? ? ?  ?Progress Toward Goals ? ?OT Goals(current goals can now be found in the care plan section) ? Progress towards OT goals: Progressing toward goals ? ?   ?Plan Discharge plan remains appropriate   ? ?Co-evaluation ? ? ?   ?  ?  ?  ?  ? ?  ?AM-PAC OT "6 Clicks" Daily  Activity     ?Outcome Measure ? ? Help from another person eating meals?: A Little ?Help from another person taking care of personal grooming?: A Little ?Help from another person toileting, which includes using toliet, bedpan, or urinal?: Total ?Help from another person bathing (including washing, rinsing, drying)?: A Lot ?Help from another person to put on and taking off regular upper body clothing?: A Little ?Help from another person to put on and taking off regular lower body clothing?: A Lot ?6 Click Score: 14 ? ?  ?End of Session   ? ?OT Visit Diagnosis: Unsteadiness on feet (R26.81);Other abnormalities of gait and mobility (R26.89);Muscle weakness (generalized) (M62.81);Pain ?  ?Activity Tolerance Patient tolerated treatment well ?  ?Patient Left in bed;with call bell/phone within reach;with bed alarm set;with nursing/sitter in room ?  ?Nurse Communication   ?  ? ?   ? ?Time: 7544-9201 ?OT Time Calculation (min): 11 min ? ?Charges: OT General Charges ?$OT Visit: 1 Visit ?OT Treatments ?$Self Care/Home Management : 8-22 mins ? ?Sonia Baller, COTA/L ?Acute Rehabilitation ?[phone]  ? ?Tanya Nones ?07/31/2021, 12:16 PM ?

## 2021-08-01 DIAGNOSIS — U071 COVID-19: Secondary | ICD-10-CM | POA: Diagnosis not present

## 2021-08-01 NOTE — Progress Notes (Signed)
?      ?                 PROGRESS NOTE ? ?      ?PATIENT DETAILS ?Name: [name] ?Age: 88 y.o. ?Sex: female ?Date of Birth: 1929/04/21 ?Admit Date: 07/25/2021 ?Admitting Physician Orene Desanctis, DO ?NIO:EVOJ, [name], MD ? ?Brief Summary: ?Patient is a 88 y.o.  female with remote history of lung cancer, steroid-dependent RA, COPD, CAD-who presented with shortness of breath-she was thought to have sepsis with hypoxia due to COVID-19 infection and subsequently admitted to the hospitalist service.  See below for further details. ? ?Significant events: ?4/8>> admit to [facility] for sepsis/hypoxia due to COVID-19 infection. ? ?Significant studies: ?4/8>> CXR: No active disease-s/p posttreatment changes on the right lung. ?4/9>> CXR: Findings highly concerning for probable recurrence of right-sided lung cancer with postobstructive changes in the right upper lobe. ?4/13>> CT chest: Stable biapical scarring, stable opacity in the right upper lobe is consistent with posttherapy changes related to history of lung cancer.  Small right pleural effusion.  Right middle lobe opacity-concerning for atelectasis/PNA. ? ?Significant microbiology data: ?4/8>> COVID PCR: Positive ?4/8>> blood culture: Negative ?4/10>> stool C. difficile studies: Negative ? ?Procedures: ?None ? ?Consults: ?None  ? ?Subjective: ?Had some loose stools again yesterday.  Inquiring about when she will be discharged to SNF.  No major issues overnight per nursing staff. ? ?Objective: ?Vitals: ?Blood pressure (!) 171/48, pulse 64, temperature 98.3 ?F (36.8 ?C), temperature source Oral, resp. rate 20, height 4\' 8"  (1.422 m), weight 46.3 kg, SpO2 95 %.  ? ?Exam: ?Gen Exam:Alert awake-not in any distress ?HEENT:atraumatic, normocephalic ?Chest: B/L clear to auscultation anteriorly ?CVS:S1S2 regular ?Abdomen:soft non tender, non distended ?Extremities:no edema ?Neurology: Non focal ?Skin: no rash  ? ?Pertinent Labs/Radiology: ? ?  Latest Ref Rng & Units 07/30/2021  ?   2:17 AM 07/29/2021  ?  1:53 AM 07/28/2021  ?  2:58 AM  ?CBC  ?WBC 4.0 - 10.5 K/uL 9.9   8.7   6.2    ?Hemoglobin 12.0 - 15.0 g/dL 10.3   10.7   11.0    ?Hematocrit 36.0 - 46.0 % 33.0   32.9   34.1    ?Platelets 150 - 400 K/uL 135   129   140    ?  ?Lab Results  ?Component Value Date  ? NA 139 07/31/2021  ? K 4.8 07/31/2021  ? CL 108 07/31/2021  ? CO2 25 07/31/2021  ? ?  ? ? ?Assessment/Plan: ?Sepsis: Due to COVID-19 infection/aspiration pneumonia-sepsis physiology has resolved with supportive care.   ? ?Acute hypoxic respiratory failure due to COVID-19 infection +/- superimposed bacterial pneumonia and COPD exacerbation: Significant improvement in hypoxemia-was on room air this morning.  Has completed 5 days of Remdesivir-and 5 days of Rocephin/Zithromax.  Steroid dosage has been tapered down to her usual dosing of prednisone.  Continue supportive care.   ? ?AKI: Mild-likely hemodynamically mediated kidney injury in the setting of diarrhea/sepsis.  Improving.  ? ?Diarrhea: C. difficile studies negative-could be due to COVID-19 infection.  Continue with as needed Imodium. ? ?Steroid-dependent RA: Back on usual dosing of 10 mg of prednisone. ? ?HTN: BP was soft-hence all antihypertensives on hold-resume when able. ? ?Anxiety: Due to acute illness-escalating doses of steroids-low-dose as needed Xanax started which seems to help.   ? ?Remote history of lung cancer: CT chest on 4/13-does not show any recurrence of malignancy. ? ?Debility/deconditioning: Due to acute illness-recommendations are for  SNF on discharge. ? ?BMI: ?Estimated body mass index is 22.87 kg/m? as calculated from the following: ?  Height as of this encounter: 4\' 8"  (1.422 m). ?  Weight as of this encounter: 46.3 kg.  ? ?Code status: ?  Code Status: DNR  ? ?DVT Prophylaxis: ?enoxaparin (LOVENOX) injection 30 mg Start: 07/26/21 1000 ?  ?Family Communication: Daughter-[name]-[phone]-left voicemail on 4/15 ? ? ?Disposition Plan: ?Status is:  Inpatient ?Remains inpatient appropriate because: Awaiting completion of isolation.  Before patient can be discharged to SNF. ?  ?Planned Discharge Destination:Skilled nursing facility ? ? ?Diet: ?Diet Order   ? ?       ?  Diet regular Room service appropriate? No; Fluid consistency: Thin  Diet effective now       ?  ? ?  ?  ? ?  ?  ? ? ?Antimicrobial agents: ?Anti-infectives (From admission, onward)  ? ? Start     Dose/Rate Route Frequency Ordered Stop  ? 07/27/21 1000  remdesivir 100 mg in sodium chloride 0.9 % 100 mL IVPB       ?See Hyperspace for full Linked Orders Report.  ? 100 mg ?200 mL/hr over 30 Minutes Intravenous Daily 07/26/21 1138 07/30/21 1050  ? 07/27/21 0930  cefTRIAXone (ROCEPHIN) 1 g in sodium chloride 0.9 % 100 mL IVPB  Status:  Discontinued       ? 1 g ?200 mL/hr over 30 Minutes Intravenous Every 24 hours 07/27/21 0835 07/31/21 1253  ? 07/27/21 0930  azithromycin (ZITHROMAX) 500 mg in sodium chloride 0.9 % 250 mL IVPB       ? 500 mg ?250 mL/hr over 60 Minutes Intravenous Every 24 hours 07/27/21 0835 07/31/21 1241  ? 07/26/21 2200  molnupiravir EUA (LAGEVRIO) capsule 800 mg  Status:  Discontinued       ? 4 capsule Oral 2 times daily 07/26/21 0955 07/26/21 1139  ? 07/26/21 1230  remdesivir 200 mg in sodium chloride 0.9% 250 mL IVPB       ?See Hyperspace for full Linked Orders Report.  ? 200 mg ?580 mL/hr over 30 Minutes Intravenous Once 07/26/21 1138 07/26/21 1349  ? 07/26/21 1045  molnupiravir EUA (LAGEVRIO) capsule 800 mg  Status:  Discontinued       ? 4 capsule Oral 2 times daily 07/26/21 0945 07/26/21 0955  ? 07/25/21 2359  nirmatrelvir/ritonavir EUA (renal dosing) (PAXLOVID) 2 tablet  Status:  Discontinued       ? 2 tablet Oral 2 times daily 07/25/21 2303 07/26/21 0945  ? 07/25/21 2300  nirmatrelvir/ritonavir EUA (PAXLOVID) 3 tablet  Status:  Discontinued       ? 3 tablet Oral 2 times daily 07/25/21 2259 07/25/21 2303  ? 07/25/21 1915  cefTRIAXone (ROCEPHIN) 2 g in sodium chloride 0.9 % 100  mL IVPB  Status:  Discontinued       ? 2 g ?200 mL/hr over 30 Minutes Intravenous Every 24 hours 07/25/21 1901 07/25/21 2259  ? 07/25/21 1915  doxycycline (VIBRAMYCIN) 100 mg in sodium chloride 0.9 % 250 mL IVPB  Status:  Discontinued       ? 100 mg ?125 mL/hr over 120 Minutes Intravenous Every 12 hours 07/25/21 1901 07/25/21 2259  ? ?  ? ? ? ?MEDICATIONS: ?Scheduled Meds: ? enoxaparin (LOVENOX) injection  30 mg Subcutaneous Daily  ? Ipratropium-Albuterol  1 puff Inhalation BID  ? mometasone-formoterol  2 puff Inhalation BID  ? predniSONE  10 mg Oral Q breakfast  ? ?Continuous Infusions: ? ? ?  PRN Meds:.acetaminophen, albuterol, ALPRAZolam, dextromethorphan-guaiFENesin, hydrALAZINE, loperamide, ondansetron (ZOFRAN) IV, senna-docusate, umeclidinium-vilanterol ? ? ?I have personally reviewed following labs and imaging studies ? ?LABORATORY DATA: ?CBC: ?Recent Labs  ?Lab 07/26/21 ?9935 07/27/21 ?0304 07/28/21 ?0258 07/29/21 ?0153 07/30/21 ?0217  ?WBC 5.9 3.9* 6.2 8.7 9.9  ?NEUTROABS 3.6 3.2 5.7 8.1* 8.8*  ?HGB 10.5* 12.0 11.0* 10.7* 10.3*  ?HCT 32.6* 37.4 34.1* 32.9* 33.0*  ?MCV 104.2* 101.4* 99.7 102.2* 103.1*  ?PLT 146* 121* 140* 129* 135*  ? ? ? ?Basic Metabolic Panel: ?Recent Labs  ?Lab 07/27/21 ?0304 07/28/21 ?7017 07/29/21 ?0153 07/30/21 ?7939 07/31/21 ?0231  ?NA 140 141 142 141 139  ?K 3.8 3.4* 4.2 3.7 4.8  ?CL 104 106 108 110 108  ?CO2 21* 27 24 23 25   ?GLUCOSE 146* 192* 253* 224* 157*  ?BUN 30* 45* 68* 62* 52*  ?CREATININE 1.39* 1.42* 1.52* 1.29* 1.30*  ?CALCIUM 8.1* 7.8* 8.3* 8.4* 8.0*  ?MG  --  2.1 2.4 2.6* 2.6*  ? ? ? ?GFR: ?Estimated Creatinine Clearance: 18.7 mL/min (A) (by C-G formula based on SCr of 1.3 mg/dL (H)). ? ?Liver Function Tests: ?Recent Labs  ?Lab 07/26/21 ?0300 07/27/21 ?0304 07/28/21 ?0258 07/29/21 ?0153 07/30/21 ?0217  ?AST 17 29 31  48* 47*  ?ALT 10 17 19 23  39  ?ALKPHOS 25* 31* 28* 32* 34*  ?BILITOT 0.8 1.2 0.6 0.3 0.5  ?PROT 5.0* 5.3* 5.2* 5.1* 5.3*  ?ALBUMIN 2.6* 2.8* 2.8* 2.9* 3.0*   ? ? ?No results for input(s): LIPASE, AMYLASE in the last 168 hours. ?No results for input(s): AMMONIA in the last 168 hours. ? ?Coagulation Profile: ?Recent Labs  ?Lab 07/25/21 ?1820  ?INR 1.0  ? ? ? ?Cardiac Enzymes

## 2021-08-02 DIAGNOSIS — U071 COVID-19: Secondary | ICD-10-CM | POA: Diagnosis not present

## 2021-08-02 LAB — GLUCOSE, CAPILLARY
Glucose-Capillary: 89 mg/dL (ref 70–99)
Glucose-Capillary: 100 mg/dL — ABNORMAL HIGH (ref 70–99)
Glucose-Capillary: 145 mg/dL — ABNORMAL HIGH (ref 70–99)

## 2021-08-02 NOTE — TOC Progression Note (Signed)
Transition of Care (TOC) - Progression Note  ? ? ?Patient Details  ?Name: [name] ?MRN: [id number] ?Date of Birth: Apr 28, 1929 ? ?Transition of Care (TOC) CM/SW Contact  ?Joanne Chars, LCSW ?Phone Number: ?08/02/2021, 10:52 AM ? ?Clinical Narrative:   Referral sent out in hub for SNF. ? ? ? ?Expected Discharge Plan: [place] ?Barriers to Discharge: SNF Covid, SNF Pending bed offer, Insurance Authorization ? ?Expected Discharge Plan and Services ?Expected Discharge Plan: [place] ?In-house Referral: Clinical Social Work ?  ?Post Acute Care Choice: [place] ?Living arrangements for the past 2 months: Apartment ?                ?  ?  ?  ?  ?  ?  ?  ?  ?  ?  ? ? ?Social Determinants of Health (SDOH) Interventions ?  ? ?Readmission Risk Interventions ?   ? View : No data to display.  ?  ?  ?  ? ? ?

## 2021-08-02 NOTE — Progress Notes (Signed)
?      ?                 PROGRESS NOTE ? ?      ?PATIENT DETAILS ?Name: [name] ?Age: 88 y.o. ?Sex: female ?Date of Birth: [birth year] ?Admit Date: 07/25/2021 ?Admitting Physician Orene Desanctis, DO ?VZD:GLOV, [name], MD ? ?Brief Summary: ?Patient is a 88 y.o.  female with remote history of lung cancer, steroid-dependent RA, COPD, CAD-who presented with shortness of breath-she was thought to have sepsis with hypoxia due to COVID-19 infection and subsequently admitted to the hospitalist service.  See below for further details. ? ?Significant events: ?4/8>> admit to [facility] for sepsis/hypoxia due to COVID-19 infection. ? ?Significant studies: ?4/8>> CXR: No active disease-s/p posttreatment changes on the right lung. ?4/9>> CXR: Findings highly concerning for probable recurrence of right-sided lung cancer with postobstructive changes in the right upper lobe. ?4/13>> CT chest: Stable biapical scarring, stable opacity in the right upper lobe is consistent with posttherapy changes related to history of lung cancer.  Small right pleural effusion.  Right middle lobe opacity-concerning for atelectasis/PNA. ? ?Significant microbiology data: ?4/8>> COVID PCR: Positive ?4/8>> blood culture: Negative ?4/10>> stool C. difficile studies: Negative ? ?Procedures: ?None ? ?Consults: ?None  ? ?Subjective: ?Lying comfortably in bed-slightly anxious-minimal diarrhea yesterday.  No major issues overnight. ? ?Objective: ?Vitals: ?Blood pressure (!) 183/63, pulse 77, temperature 98 ?F (36.7 ?C), temperature source Oral, resp. rate 15, height 4\' 8"  (1.422 m), weight 46.3 kg, SpO2 96 %.  ? ?Exam: ?Gen Exam:Alert awake-not in any distress ?HEENT:atraumatic, normocephalic ?Chest: B/L clear to auscultation anteriorly ?CVS:S1S2 regular ?Abdomen:soft non tender, non distended ?Extremities:no edema ?Neurology: Non focal ?Skin: no rash  ? ?Pertinent Labs/Radiology: ? ?  Latest Ref Rng & Units 07/30/2021  ?  2:17 AM 07/29/2021  ?  1:53 AM 07/28/2021  ?   2:58 AM  ?CBC  ?WBC 4.0 - 10.5 K/uL 9.9   8.7   6.2    ?Hemoglobin 12.0 - 15.0 g/dL 10.3   10.7   11.0    ?Hematocrit 36.0 - 46.0 % 33.0   32.9   34.1    ?Platelets 150 - 400 K/uL 135   129   140    ?  ?Lab Results  ?Component Value Date  ? NA 139 07/31/2021  ? K 4.8 07/31/2021  ? CL 108 07/31/2021  ? CO2 25 07/31/2021  ? ?  ? ? ?Assessment/Plan: ?Sepsis: Due to COVID-19 infection/aspiration pneumonia-sepsis physiology has resolved with supportive care.   ? ?Acute hypoxic respiratory failure due to COVID-19 infection +/- superimposed bacterial pneumonia and COPD exacerbation: Significant improvement in hypoxemia-was on room air this morning.  Has completed 5 days of Remdesivir-and 5 days of Rocephin/Zithromax.  Steroid dosage has been tapered down to her usual dosing of prednisone.  Continue supportive care.   ? ?AKI: Mild-likely hemodynamically mediated kidney injury in the setting of diarrhea/sepsis.  Improving.  ? ?Diarrhea: C. difficile studies negative-could be due to COVID-19 infection.  Continue with as needed Imodium. ? ?Steroid-dependent RA: Back on usual dosing of 10 mg of prednisone. ? ?HTN: BP was soft-hence all antihypertensives on hold-resume when able. ? ?Anxiety: Due to acute illness-escalating doses of steroids-low-dose as needed Xanax started which seems to help.   ? ?Remote history of lung cancer: CT chest on 4/13-does not show any recurrence of malignancy. ? ?Debility/deconditioning: Due to acute illness-recommendations are for SNF on discharge. ? ?BMI: ?Estimated body mass index is 22.87 kg/m?  as calculated from the following: ?  Height as of this encounter: 4\' 8"  (1.422 m). ?  Weight as of this encounter: 46.3 kg.  ? ?Code status: ?  Code Status: DNR  ? ?DVT Prophylaxis: ?enoxaparin (LOVENOX) injection 30 mg Start: 07/26/21 1000 ?  ?Family Communication: Daughter-[name]-[phone]-left voicemail on 4/16 ? ? ?Disposition Plan: ?Status is: Inpatient ?Remains inpatient appropriate because:  Awaiting completion of isolation.  Before patient can be discharged to SNF. ?  ?Planned Discharge Destination:Skilled nursing facility ? ? ?Diet: ?Diet Order   ? ?       ?  Diet regular Room service appropriate? No; Fluid consistency: Thin  Diet effective now       ?  ? ?  ?  ? ?  ?  ? ? ?Antimicrobial agents: ?Anti-infectives (From admission, onward)  ? ? Start     Dose/Rate Route Frequency Ordered Stop  ? 07/27/21 1000  remdesivir 100 mg in sodium chloride 0.9 % 100 mL IVPB       ?See Hyperspace for full Linked Orders Report.  ? 100 mg ?200 mL/hr over 30 Minutes Intravenous Daily 07/26/21 1138 07/30/21 1050  ? 07/27/21 0930  cefTRIAXone (ROCEPHIN) 1 g in sodium chloride 0.9 % 100 mL IVPB  Status:  Discontinued       ? 1 g ?200 mL/hr over 30 Minutes Intravenous Every 24 hours 07/27/21 0835 07/31/21 1253  ? 07/27/21 0930  azithromycin (ZITHROMAX) 500 mg in sodium chloride 0.9 % 250 mL IVPB       ? 500 mg ?250 mL/hr over 60 Minutes Intravenous Every 24 hours 07/27/21 0835 07/31/21 1241  ? 07/26/21 2200  molnupiravir EUA (LAGEVRIO) capsule 800 mg  Status:  Discontinued       ? 4 capsule Oral 2 times daily 07/26/21 0955 07/26/21 1139  ? 07/26/21 1230  remdesivir 200 mg in sodium chloride 0.9% 250 mL IVPB       ?See Hyperspace for full Linked Orders Report.  ? 200 mg ?580 mL/hr over 30 Minutes Intravenous Once 07/26/21 1138 07/26/21 1349  ? 07/26/21 1045  molnupiravir EUA (LAGEVRIO) capsule 800 mg  Status:  Discontinued       ? 4 capsule Oral 2 times daily 07/26/21 0945 07/26/21 0955  ? 07/25/21 2359  nirmatrelvir/ritonavir EUA (renal dosing) (PAXLOVID) 2 tablet  Status:  Discontinued       ? 2 tablet Oral 2 times daily 07/25/21 2303 07/26/21 0945  ? 07/25/21 2300  nirmatrelvir/ritonavir EUA (PAXLOVID) 3 tablet  Status:  Discontinued       ? 3 tablet Oral 2 times daily 07/25/21 2259 07/25/21 2303  ? 07/25/21 1915  cefTRIAXone (ROCEPHIN) 2 g in sodium chloride 0.9 % 100 mL IVPB  Status:  Discontinued       ? 2 g ?200  mL/hr over 30 Minutes Intravenous Every 24 hours 07/25/21 1901 07/25/21 2259  ? 07/25/21 1915  doxycycline (VIBRAMYCIN) 100 mg in sodium chloride 0.9 % 250 mL IVPB  Status:  Discontinued       ? 100 mg ?125 mL/hr over 120 Minutes Intravenous Every 12 hours 07/25/21 1901 07/25/21 2259  ? ?  ? ? ? ?MEDICATIONS: ?Scheduled Meds: ? enoxaparin (LOVENOX) injection  30 mg Subcutaneous Daily  ? Ipratropium-Albuterol  1 puff Inhalation BID  ? mometasone-formoterol  2 puff Inhalation BID  ? predniSONE  10 mg Oral Q breakfast  ? ?Continuous Infusions: ? ? ?PRN Meds:.acetaminophen, albuterol, ALPRAZolam, dextromethorphan-guaiFENesin, hydrALAZINE, loperamide, ondansetron (ZOFRAN) IV, senna-docusate, umeclidinium-vilanterol ? ? ?  I have personally reviewed following labs and imaging studies ? ?LABORATORY DATA: ?CBC: ?Recent Labs  ?Lab 07/27/21 ?0304 07/28/21 ?2956 07/29/21 ?0153 07/30/21 ?0217  ?WBC 3.9* 6.2 8.7 9.9  ?NEUTROABS 3.2 5.7 8.1* 8.8*  ?HGB 12.0 11.0* 10.7* 10.3*  ?HCT 37.4 34.1* 32.9* 33.0*  ?MCV 101.4* 99.7 102.2* 103.1*  ?PLT 121* 140* 129* 135*  ? ? ? ?Basic Metabolic Panel: ?Recent Labs  ?Lab 07/27/21 ?0304 07/28/21 ?2130 07/29/21 ?0153 07/30/21 ?8657 07/31/21 ?0231  ?NA 140 141 142 141 139  ?K 3.8 3.4* 4.2 3.7 4.8  ?CL 104 106 108 110 108  ?CO2 21* 27 24 23 25   ?GLUCOSE 146* 192* 253* 224* 157*  ?BUN 30* 45* 68* 62* 52*  ?CREATININE 1.39* 1.42* 1.52* 1.29* 1.30*  ?CALCIUM 8.1* 7.8* 8.3* 8.4* 8.0*  ?MG  --  2.1 2.4 2.6* 2.6*  ? ? ? ?GFR: ?Estimated Creatinine Clearance: 18.7 mL/min (A) (by C-G formula based on SCr of 1.3 mg/dL (H)). ? ?Liver Function Tests: ?Recent Labs  ?Lab 07/27/21 ?0304 07/28/21 ?8469 07/29/21 ?0153 07/30/21 ?0217  ?AST 29 31 48* 47*  ?ALT 17 19 23  39  ?ALKPHOS 31* 28* 32* 34*  ?BILITOT 1.2 0.6 0.3 0.5  ?PROT 5.3* 5.2* 5.1* 5.3*  ?ALBUMIN 2.8* 2.8* 2.9* 3.0*  ? ? ?No results for input(s): LIPASE, AMYLASE in the last 168 hours. ?No results for input(s): AMMONIA in the last 168  hours. ? ?Coagulation Profile: ?No results for input(s): INR, PROTIME in the last 168 hours. ? ? ?Cardiac Enzymes: ?No results for input(s): CKTOTAL, CKMB, CKMBINDEX, TROPONINI in the last 168 hours. ? ?BNP (last 3 results) ?N

## 2021-08-03 DIAGNOSIS — U071 COVID-19: Secondary | ICD-10-CM | POA: Diagnosis not present

## 2021-08-03 LAB — GLUCOSE, CAPILLARY
Glucose-Capillary: 82 mg/dL (ref 70–99)
Glucose-Capillary: 120 mg/dL — ABNORMAL HIGH (ref 70–99)
Glucose-Capillary: 222 mg/dL — ABNORMAL HIGH (ref 70–99)

## 2021-08-03 NOTE — Plan of Care (Signed)
Pt alert and oriented x 4. Complex communication related to [facility]. Pt up to bsc 1 assist. Pt had bm 4/16 rated bristol scale 5. Pt is on 2l  increased from 1 to 2 due to desating when up to bsc and shortness of breath with ambulation. Lungs diminished. Pt productive cough. Poor po intake. Fresh ice Water offered and placed at bedside pt refused and wanted it placed next to sink. Pt had nausea during overnight zofran given and effective. Pt offered xanax to help rest pt refused. Legs edematous and foot of bed elevated.  ?Problem: Education: ?Goal: Knowledge of General Education information will improve ?Description: Including pain rating scale, medication(s)/side effects and non-pharmacologic comfort measures ?Outcome: Progressing ?  ?Problem: Health Behavior/Discharge Planning: ?Goal: Ability to manage health-related needs will improve ?Outcome: Progressing ?  ?Problem: Clinical Measurements: ?Goal: Ability to maintain clinical measurements within normal limits will improve ?Outcome: Progressing ?Goal: Will remain free from infection ?Outcome: Progressing ?Goal: Diagnostic test results will improve ?Outcome: Progressing ?Goal: Respiratory complications will improve ?Outcome: Progressing ?Goal: Cardiovascular complication will be avoided ?Outcome: Progressing ?  ?Problem: Activity: ?Goal: Risk for activity intolerance will decrease ?Outcome: Progressing ?  ?Problem: Nutrition: ?Goal: Adequate nutrition will be maintained ?Outcome: Progressing ?  ?Problem: Coping: ?Goal: Level of anxiety will decrease ?Outcome: Progressing ?  ?Problem: Elimination: ?Goal: Will not experience complications related to bowel motility ?Outcome: Progressing ?Goal: Will not experience complications related to urinary retention ?Outcome: Progressing ?  ?Problem: Pain Managment: ?Goal: General experience of comfort will improve ?Outcome: Progressing ?  ?Problem: Safety: ?Goal: Ability to remain free from injury will improve ?Outcome:  Progressing ?  ?Problem: Skin Integrity: ?Goal: Risk for impaired skin integrity will decrease ?Outcome: Progressing ?  ?

## 2021-08-03 NOTE — Progress Notes (Signed)
?      ?                 PROGRESS NOTE ? ?      ?PATIENT DETAILS ?Name: [name] ?Age: 88 y.o. ?Sex: female ?Date of Birth: 07/07/1929 ?Admit Date: 07/25/2021 ?Admitting Physician Orene Desanctis, DO ?SWN:IOEV, [name], MD ? ?Brief Summary: ?Patient is a 88 y.o.  female with remote history of lung cancer, steroid-dependent RA, COPD, CAD-who presented with shortness of breath-she was thought to have sepsis with hypoxia due to COVID-19 infection and subsequently admitted to the hospitalist service.  See below for further details. ? ?Significant events: ?4/8>> admit to [facility] for sepsis/hypoxia due to COVID-19 infection. ? ?Significant studies: ?4/8>> CXR: No active disease-s/p posttreatment changes on the right lung. ?4/9>> CXR: Findings highly concerning for probable recurrence of right-sided lung cancer with postobstructive changes in the right upper lobe. ?4/13>> CT chest: Stable biapical scarring, stable opacity in the right upper lobe is consistent with posttherapy changes related to history of lung cancer.  Small right pleural effusion.  Right middle lobe opacity-concerning for atelectasis/PNA. ? ?Significant microbiology data: ?4/8>> COVID PCR: Positive ?4/8>> blood culture: Negative ?4/10>> stool C. difficile studies: Negative ? ?Procedures: ?None ? ?Consults: ?None  ? ?Subjective: ?No major issues overnight-some were anxious about being discharged to SNF tomorrow.  Denies any diarrhea. ? ?Objective: ?Vitals: ?Blood pressure (!) 144/66, pulse 88, temperature 99.1 ?F (37.3 ?C), temperature source Oral, resp. rate 19, height 4\' 8"  (1.422 m), weight 46.3 kg, SpO2 93 %.  ? ?Exam: ?Gen Exam:Alert awake-not in any distress ?HEENT:atraumatic, normocephalic ?Chest: B/L clear to auscultation anteriorly ?CVS:S1S2 regular ?Abdomen:soft non tender, non distended ?Extremities:no edema ?Neurology: Non focal ?Skin: no rash  ? ?Pertinent Labs/Radiology: ? ?  Latest Ref Rng & Units 07/30/2021  ?  2:17 AM 07/29/2021  ?  1:53 AM  07/28/2021  ?  2:58 AM  ?CBC  ?WBC 4.0 - 10.5 K/uL 9.9   8.7   6.2    ?Hemoglobin 12.0 - 15.0 g/dL 10.3   10.7   11.0    ?Hematocrit 36.0 - 46.0 % 33.0   32.9   34.1    ?Platelets 150 - 400 K/uL 135   129   140    ?  ?Lab Results  ?Component Value Date  ? NA 139 07/31/2021  ? K 4.8 07/31/2021  ? CL 108 07/31/2021  ? CO2 25 07/31/2021  ? ?  ? ? ?Assessment/Plan: ?Sepsis: Due to COVID-19 infection/aspiration pneumonia-sepsis physiology has resolved with supportive care.   ? ?Acute hypoxic respiratory failure due to COVID-19 infection +/- superimposed bacterial pneumonia and COPD exacerbation: Significant improvement in hypoxemia-was on room air this morning.  Has completed 5 days of Remdesivir-and 5 days of Rocephin/Zithromax.  Steroid dosage has been tapered down to her usual dosing of prednisone.  Continue supportive care.   ? ?AKI: Mild-likely hemodynamically mediated kidney injury in the setting of diarrhea/sepsis.  Improving.  ? ?Diarrhea: C. difficile studies negative-could be due to COVID-19 infection.  Continue with as needed Imodium. ? ?Steroid-dependent RA: Back on usual dosing of 10 mg of prednisone. ? ?HTN: BP was soft-hence all antihypertensives on hold-resume when able. ? ?Anxiety: Due to acute illness-escalating doses of steroids-low-dose as needed Xanax started which seems to help.   ? ?Remote history of lung cancer: CT chest on 4/13-does not show any recurrence of malignancy. ? ?Debility/deconditioning: Due to acute illness-recommendations are for SNF on discharge. ? ?BMI: ?Estimated body mass  index is 22.87 kg/m? as calculated from the following: ?  Height as of this encounter: 4\' 8"  (1.422 m). ?  Weight as of this encounter: 46.3 kg.  ? ?Code status: ?  Code Status: DNR  ? ?DVT Prophylaxis: ?enoxaparin (LOVENOX) injection 30 mg Start: 07/26/21 1000 ?  ?Family Communication: Daughter-[name]-[phone]-left voicemail on 4/16 ? ? ?Disposition Plan: ?Status is: Inpatient ?Remains inpatient appropriate  because: Awaiting completion of isolation.  Before patient can be discharged to SNF. ?  ?Planned Discharge Destination:Skilled nursing facility ? ? ?Diet: ?Diet Order   ? ?       ?  Diet regular Room service appropriate? No; Fluid consistency: Thin  Diet effective now       ?  ? ?  ?  ? ?  ?  ? ? ?Antimicrobial agents: ?Anti-infectives (From admission, onward)  ? ? Start     Dose/Rate Route Frequency Ordered Stop  ? 07/27/21 1000  remdesivir 100 mg in sodium chloride 0.9 % 100 mL IVPB       ?See Hyperspace for full Linked Orders Report.  ? 100 mg ?200 mL/hr over 30 Minutes Intravenous Daily 07/26/21 1138 07/30/21 1050  ? 07/27/21 0930  cefTRIAXone (ROCEPHIN) 1 g in sodium chloride 0.9 % 100 mL IVPB  Status:  Discontinued       ? 1 g ?200 mL/hr over 30 Minutes Intravenous Every 24 hours 07/27/21 0835 07/31/21 1253  ? 07/27/21 0930  azithromycin (ZITHROMAX) 500 mg in sodium chloride 0.9 % 250 mL IVPB       ? 500 mg ?250 mL/hr over 60 Minutes Intravenous Every 24 hours 07/27/21 0835 07/31/21 1241  ? 07/26/21 2200  molnupiravir EUA (LAGEVRIO) capsule 800 mg  Status:  Discontinued       ? 4 capsule Oral 2 times daily 07/26/21 0955 07/26/21 1139  ? 07/26/21 1230  remdesivir 200 mg in sodium chloride 0.9% 250 mL IVPB       ?See Hyperspace for full Linked Orders Report.  ? 200 mg ?580 mL/hr over 30 Minutes Intravenous Once 07/26/21 1138 07/26/21 1349  ? 07/26/21 1045  molnupiravir EUA (LAGEVRIO) capsule 800 mg  Status:  Discontinued       ? 4 capsule Oral 2 times daily 07/26/21 0945 07/26/21 0955  ? 07/25/21 2359  nirmatrelvir/ritonavir EUA (renal dosing) (PAXLOVID) 2 tablet  Status:  Discontinued       ? 2 tablet Oral 2 times daily 07/25/21 2303 07/26/21 0945  ? 07/25/21 2300  nirmatrelvir/ritonavir EUA (PAXLOVID) 3 tablet  Status:  Discontinued       ? 3 tablet Oral 2 times daily 07/25/21 2259 07/25/21 2303  ? 07/25/21 1915  cefTRIAXone (ROCEPHIN) 2 g in sodium chloride 0.9 % 100 mL IVPB  Status:  Discontinued       ? 2  g ?200 mL/hr over 30 Minutes Intravenous Every 24 hours 07/25/21 1901 07/25/21 2259  ? 07/25/21 1915  doxycycline (VIBRAMYCIN) 100 mg in sodium chloride 0.9 % 250 mL IVPB  Status:  Discontinued       ? 100 mg ?125 mL/hr over 120 Minutes Intravenous Every 12 hours 07/25/21 1901 07/25/21 2259  ? ?  ? ? ? ?MEDICATIONS: ?Scheduled Meds: ? enoxaparin (LOVENOX) injection  30 mg Subcutaneous Daily  ? Ipratropium-Albuterol  1 puff Inhalation BID  ? mometasone-formoterol  2 puff Inhalation BID  ? predniSONE  10 mg Oral Q breakfast  ? ?Continuous Infusions: ? ? ?PRN Meds:.acetaminophen, albuterol, ALPRAZolam, dextromethorphan-guaiFENesin, hydrALAZINE, loperamide, ondansetron (  ZOFRAN) IV, senna-docusate, umeclidinium-vilanterol ? ? ?I have personally reviewed following labs and imaging studies ? ?LABORATORY DATA: ?CBC: ?Recent Labs  ?Lab 07/28/21 ?0258 07/29/21 ?0153 07/30/21 ?0217  ?WBC 6.2 8.7 9.9  ?NEUTROABS 5.7 8.1* 8.8*  ?HGB 11.0* 10.7* 10.3*  ?HCT 34.1* 32.9* 33.0*  ?MCV 99.7 102.2* 103.1*  ?PLT 140* 129* 135*  ? ? ? ?Basic Metabolic Panel: ?Recent Labs  ?Lab 07/28/21 ?0258 07/29/21 ?0153 07/30/21 ?2992 07/31/21 ?0231  ?NA 141 142 141 139  ?K 3.4* 4.2 3.7 4.8  ?CL 106 108 110 108  ?CO2 27 24 23 25   ?GLUCOSE 192* 253* 224* 157*  ?BUN 45* 68* 62* 52*  ?CREATININE 1.42* 1.52* 1.29* 1.30*  ?CALCIUM 7.8* 8.3* 8.4* 8.0*  ?MG 2.1 2.4 2.6* 2.6*  ? ? ? ?GFR: ?Estimated Creatinine Clearance: 18.7 mL/min (A) (by C-G formula based on SCr of 1.3 mg/dL (H)). ? ?Liver Function Tests: ?Recent Labs  ?Lab 07/28/21 ?0258 07/29/21 ?0153 07/30/21 ?0217  ?AST 31 48* 47*  ?ALT 19 23 39  ?ALKPHOS 28* 32* 34*  ?BILITOT 0.6 0.3 0.5  ?PROT 5.2* 5.1* 5.3*  ?ALBUMIN 2.8* 2.9* 3.0*  ? ? ?No results for input(s): LIPASE, AMYLASE in the last 168 hours. ?No results for input(s): AMMONIA in the last 168 hours. ? ?Coagulation Profile: ?No results for input(s): INR, PROTIME in the last 168 hours. ? ? ?Cardiac Enzymes: ?No results for input(s): CKTOTAL,  CKMB, CKMBINDEX, TROPONINI in the last 168 hours. ? ?BNP (last 3 results) ?No results for input(s): PROBNP in the last 8760 hours. ? ?Lipid Profile: ?No results for input(s): CHOL, HDL, LDLCALC, TRIG, CHOLHD

## 2021-08-03 NOTE — TOC Progression Note (Addendum)
Transition of Care (TOC) - Progression Note  ? ? ?Patient Details  ?Name: [name] ?MRN: [id number] ?Date of Birth: February 24, 1930 ? ?Transition of Care (TOC) CM/SW Contact  ?Benard Halsted, LCSW ?Phone Number: ?08/03/2021, 9:37 AM ? ?Clinical Narrative:    ?9:37am-CSW spoke with patient's daughter and provided update. PT to see patient today and CSW can begin insurance process for discharge tomorrow. Daughter still requesting 1-[facility] 2-[facility] 3-[facility].  ? ?12:22pm-[facility] unable to accept patient. CSW spoke with patient's daughter and she would like to choose [facility] as her brother went there before. CSW will initiate insurance process, Ref# [id number].  ? ? ?Expected Discharge Plan: [place] ?Barriers to Discharge: SNF Covid, SNF Pending bed offer, Insurance Authorization ? ?Expected Discharge Plan and Services ?Expected Discharge Plan: [place] ?In-house Referral: Clinical Social Work ?  ?Post Acute Care Choice: [place] ?Living arrangements for the past 2 months: Apartment ?                ?  ?  ?  ?  ?  ?  ?  ?  ?  ?  ? ? ?Social Determinants of Health (SDOH) Interventions ?  ? ?Readmission Risk Interventions ?   ? View : No data to display.  ?  ?  ?  ? ? ?

## 2021-08-03 NOTE — Progress Notes (Signed)
Physical Therapy Treatment ?Patient Details ?Name: [name] Date ?MRN: [id number] ?DOB: Jun 07, 1929 ?Today's Date: 08/03/2021 ? ? ?History of Present Illness Pt is a 88 yr old female who presented 4/8 from ILF  due to SOB. Pt found to be COVID +, sepsis due to bacterial pneumonia and UTI. PMH: RA, lung ca, COPD, CAD, HOH, hernia, R hip pain ? ?  ?PT Comments  ? ? Patient continues to be anxious which likely contributes to her shortness of breath and need for frequent rest breaks during session. See ambulatory oxygen assessment note re: saturations during session. Patient continues with imbalance during transfers and required hands-on assist to correct (and for boosting assist as she fatigued). Ambulation also limited by fatigue with max distance 60 ft with RW and O2. Patient lives alone and can continue to benefit from PT to maximize her independence.  ?  ?Recommendations for follow up therapy are one component of a multi-disciplinary discharge planning process, led by the attending physician.  Recommendations may be updated based on patient status, additional functional criteria and insurance authorization. ? ?Follow Up Recommendations ? Skilled nursing-short term rehab (<3 hours/day) ?  ?  ?Assistance Recommended at Discharge Frequent or constant Supervision/Assistance  ?Patient can return home with the following A little help with walking and/or transfers;Assistance with cooking/housework;Direct supervision/assist for medications management;Direct supervision/assist for financial management;Help with stairs or ramp for entrance ?  ?Equipment Recommendations ? None recommended by PT  ?  ?Recommendations for Other Services   ? ? ?  ?Precautions / Restrictions Precautions ?Precautions: Fall ?Precaution Comments: HOH and anxiety ?Restrictions ?Weight Bearing Restrictions: No  ?  ? ?Mobility ? Bed Mobility ?Overal bed mobility: Needs Assistance, Modified Independent ?Bed Mobility: Sit to Supine ?  ?  ?  ?Sit to supine:  Modified independent (Device/Increase time) ?  ?General bed mobility comments: from EOB able to raise legs onto bed and readjust herself up and over onto mattress ?  ? ?Transfers ?Overall transfer level: Needs assistance ?Equipment used: Rolling walker (2 wheels) ?Transfers: Bed to chair/wheelchair/BSC ?Sit to Stand: Min assist ?  ?Step pivot transfers: Min assist ?  ?  ?  ?General transfer comment: on BSC on arrival; pt with LOB anteriorly as come to stand with min assist to correct; stood from EOB to RW with minguard assist with max cues for sequencing and as fatigued required min assist to initiate come to stand ?  ? ?Ambulation/Gait ?Ambulation/Gait assistance: Min guard ?Gait Distance (Feet): 60 Feet ?Assistive device: Rolling walker (2 wheels) ?Gait Pattern/deviations: Step-through pattern, Decreased stride length, Shuffle, Wide base of support ?Gait velocity: decr ?  ?  ?General Gait Details: pt prefers to wear left slipper due to callouses on bottom of foot and drags/slides left foot despite cues for incr clearance and attempt heelstrike ? ? ?Stairs ?  ?  ?  ?  ?  ? ? ?Wheelchair Mobility ?  ? ?Modified Rankin (Stroke Patients Only) ?  ? ? ?  ?Balance Overall balance assessment: Needs assistance ?Sitting-balance support: Feet supported ?Sitting balance-Leahy Scale: Fair ?  ?  ?Standing balance support: Reliant on assistive device for balance, Single extremity supported ?Standing balance-Leahy Scale: Poor ?Standing balance comment: able to let go with one hand and reaching for objects on counter she wanted ?  ?  ?  ?  ?  ?  ?  ?  ?  ?  ?  ?  ? ?  ?Cognition Arousal/Alertness: Awake/alert ?Behavior During Therapy: Anxious ?Overall Cognitive Status: Difficult to  assess ?  ?  ?  ?  ?  ?  ?  ?  ?  ?  ?  ?  ?  ?  ?  ?  ?General Comments: patient is very hard of hearing; repeats questions ?due to  not hearing answer but seems more related to anxiety ?  ?  ? ?  ?Exercises   ? ?  ?General Comments General comments  (skin integrity, edema, etc.): on 1.5 L at rest with sats 91%; on room air decr to 86%; required 2L to ambulate with sats 89-90% ?  ?  ? ?Pertinent Vitals/Pain Pain Assessment ?Pain Assessment: No/denies pain  ? ? ?Home Living   ?  ?  ?  ?  ?  ?  ?  ?  ?  ?   ?  ?Prior Function    ?  ?  ?   ? ?PT Goals (current goals can now be found in the care plan section) Acute Rehab PT Goals ?Patient Stated Goal: wants to be independent ?Time For Goal Achievement: 08/11/21 ?Potential to Achieve Goals: Good ?Progress towards PT goals: Progressing toward goals ? ?  ?Frequency ? ? ? Min 2X/week ? ? ? ?  ?PT Plan Frequency needs to be updated  ? ? ?Co-evaluation   ?  ?  ?  ?  ? ?  ?AM-PAC PT "6 Clicks" Mobility   ?Outcome Measure ? Help needed turning from your back to your side while in a flat bed without using bedrails?: None ?Help needed moving from lying on your back to sitting on the side of a flat bed without using bedrails?: A Little ?Help needed moving to and from a bed to a chair (including a wheelchair)?: A Little ?Help needed standing up from a chair using your arms (e.g., wheelchair or bedside chair)?: A Little ?Help needed to walk in hospital room?: A Little ?Help needed climbing 3-5 steps with a railing? : Total ?6 Click Score: 17 ? ?  ?End of Session Equipment Utilized During Treatment: Oxygen ?Activity Tolerance: Patient limited by fatigue ?Patient left: with call bell/phone within reach;in bed ?Nurse Communication: Mobility status ?PT Visit Diagnosis: Unsteadiness on feet (R26.81);Difficulty in walking, not elsewhere classified (R26.2) ?  ? ? ?Time: 4270-6237 ?PT Time Calculation (min) (ACUTE ONLY): 26 min ? ?Charges:  $Gait Training: 23-37 mins          ?          ? ? ?Arby Barrette, PT ?Acute Rehabilitation Services  ?Pager [phone] ?Office [phone] ? ? ? ?Jeanie Cooks Burman Bruington ?08/03/2021, 11:19 AM ? ?

## 2021-08-04 ENCOUNTER — Inpatient Hospital Stay (HOSPITAL_COMMUNITY): Payer: Medicare Other

## 2021-08-04 DIAGNOSIS — J9601 Acute respiratory failure with hypoxia: Secondary | ICD-10-CM | POA: Diagnosis not present

## 2021-08-04 DIAGNOSIS — I9589 Other hypotension: Secondary | ICD-10-CM | POA: Diagnosis not present

## 2021-08-04 DIAGNOSIS — U071 COVID-19: Secondary | ICD-10-CM | POA: Diagnosis not present

## 2021-08-04 DIAGNOSIS — M0579 Rheumatoid arthritis with rheumatoid factor of multiple sites without organ or systems involvement: Secondary | ICD-10-CM | POA: Diagnosis not present

## 2021-08-04 LAB — URINALYSIS, ROUTINE W REFLEX MICROSCOPIC
Bilirubin Urine: NEGATIVE
Glucose, UA: NEGATIVE mg/dL
Ketones, ur: 5 mg/dL — AB
Nitrite: NEGATIVE
Protein, ur: 30 mg/dL — AB
Specific Gravity, Urine: 1.017 (ref 1.005–1.030)
pH: 8 (ref 5.0–8.0)

## 2021-08-04 LAB — PROCALCITONIN: Procalcitonin: 0.44 ng/mL

## 2021-08-04 LAB — COMPREHENSIVE METABOLIC PANEL
ALT: 25 U/L (ref 0–44)
AST: 22 U/L (ref 15–41)
Albumin: 2.8 g/dL — ABNORMAL LOW (ref 3.5–5.0)
Alkaline Phosphatase: 35 U/L — ABNORMAL LOW (ref 38–126)
Anion gap: 8 (ref 5–15)
BUN: 28 mg/dL — ABNORMAL HIGH (ref 8–23)
CO2: 26 mmol/L (ref 22–32)
Calcium: 8.6 mg/dL — ABNORMAL LOW (ref 8.9–10.3)
Chloride: 106 mmol/L (ref 98–111)
Creatinine, Ser: 1.09 mg/dL — ABNORMAL HIGH (ref 0.44–1.00)
GFR, Estimated: 49 mL/min — ABNORMAL LOW (ref >60)
Glucose, Bld: 129 mg/dL — ABNORMAL HIGH (ref 70–99)
Potassium: 4.7 mmol/L (ref 3.5–5.1)
Sodium: 140 mmol/L (ref 135–145)
Total Bilirubin: 0.7 mg/dL (ref 0.3–1.2)
Total Protein: 5.3 g/dL — ABNORMAL LOW (ref 6.5–8.1)

## 2021-08-04 LAB — CBC
HCT: 34.1 % — ABNORMAL LOW (ref 36.0–46.0)
Hemoglobin: 10.6 g/dL — ABNORMAL LOW (ref 12.0–15.0)
MCH: 32.2 pg (ref 26.0–34.0)
MCHC: 31.1 g/dL (ref 30.0–36.0)
MCV: 103.6 fL — ABNORMAL HIGH (ref 80.0–100.0)
Platelets: 198 10*3/uL (ref 150–400)
RBC: 3.29 MIL/uL — ABNORMAL LOW (ref 3.87–5.11)
RDW: 14.6 % (ref 11.5–15.5)
WBC: 19.4 10*3/uL — ABNORMAL HIGH (ref 4.0–10.5)
nRBC: 0 % (ref 0.0–0.2)

## 2021-08-04 LAB — GLUCOSE, CAPILLARY
Glucose-Capillary: 92 mg/dL (ref 70–99)
Glucose-Capillary: 139 mg/dL — ABNORMAL HIGH (ref 70–99)
Glucose-Capillary: 132 mg/dL — ABNORMAL HIGH (ref 70–99)

## 2021-08-04 MED ORDER — DM-GUAIFENESIN ER 30-600 MG PO TB12: 1.0000 | ORAL_TABLET | Freq: Two times a day (BID) | ORAL | Status: DC | PRN

## 2021-08-04 MED ORDER — FUROSEMIDE 20 MG PO TABS: 20.0000 mg | ORAL_TABLET | Freq: Every day | ORAL | Status: DC | PRN

## 2021-08-04 MED ORDER — CARVEDILOL 3.125 MG PO TABS
3.1250 mg | ORAL_TABLET | Freq: Two times a day (BID) | ORAL | Status: DC
  Administered 2021-08-04 – 2021-08-06 (×4): 3.125 mg via ORAL
  Filled 2021-08-04 (×4): qty 1

## 2021-08-04 MED ORDER — IPRATROPIUM-ALBUTEROL 20-100 MCG/ACT IN AERS: 1.0000 | INHALATION_SPRAY | Freq: Two times a day (BID) | RESPIRATORY_TRACT | Status: DC

## 2021-08-04 MED ORDER — ALPRAZOLAM 0.25 MG PO TABS: 0.2500 mg | ORAL_TABLET | Freq: Two times a day (BID) | ORAL | 0 refills | Status: DC | PRN

## 2021-08-04 MED ORDER — FAMOTIDINE 20 MG PO TABS: 20.0000 mg | ORAL_TABLET | Freq: Every day | ORAL | 1 refills | Status: DC

## 2021-08-04 NOTE — Plan of Care (Signed)

## 2021-08-04 NOTE — Progress Notes (Signed)
Patient instructed / educated on the need of a urine sample. Patient unable to provide urine sample at this time. Patient reeducated on the need and if could not  provide a sample then we would assist with a straight cath to obtain a sample. ?

## 2021-08-04 NOTE — TOC Progression Note (Signed)
Transition of Care (TOC) - Progression Note  ? ? ?Patient Details  ?Name: [name] ?MRN: [id number] ?Date of Birth: 07/27/1929 ? ?Transition of Care (TOC) CM/SW Contact  ?Benard Halsted, LCSW ?Phone Number: ?08/04/2021, 12:33 PM ? ?Clinical Narrative:    ?DC held per MD. [facility] aware. ? ? ?Expected Discharge Plan: [place] ?Barriers to Discharge: Continued Medical Work up ? ?Expected Discharge Plan and Services ?Expected Discharge Plan: [place] ?In-house Referral: Clinical Social Work ?  ?Post Acute Care Choice: [place] ?Living arrangements for the past 2 months: Apartment ?Expected Discharge Date: 08/04/21               ?  ?  ?  ?  ?  ?  ?  ?  ?  ?  ? ? ?Social Determinants of Health (SDOH) Interventions ?  ? ?Readmission Risk Interventions ?   ? View : No data to display.  ?  ?  ?  ? ? ?

## 2021-08-04 NOTE — TOC Transition Note (Signed)
Transition of Care (TOC) - CM/SW Discharge Note ? ? ?Patient Details  ?Name: [name] ?MRN: [id number] ?Date of Birth: 06/18/1929 ? ?Transition of Care (TOC) CM/SW Contact:  ?Benard Halsted, LCSW ?Phone Number: ?08/04/2021, 11:15 AM ? ? ?Clinical Narrative:    ?Patient will DC to: [facility] ?Anticipated DC date: 08/04/21 ?Family notified: Daughter, [name] ?Transport by: [name] by car (not needing O2) ? ? ?Per MD patient ready for DC to [facility]. RN to call report prior to discharge [phone]). RN, patient, patient's family, and facility notified of DC. Discharge Summary and FL2 sent to facility. DC packet on chart including signed script which needs to go with patient.  ? ?CSW will sign off for now as social work intervention is no longer needed. Please consult [place] again if new needs arise. ? ? ? ? ?Final next level of care: [place] ?Barriers to Discharge: Barriers Resolved ? ? ?Patient Goals and CMS Choice ?Patient states their goals for this hospitalization and ongoing recovery are:: Rehab ?CMS Medicare.gov Compare Post Acute Care list provided to:: Patient Represenative (must comment) ?Choice offered to / list presented to : Adult Children, Patient ? ?Discharge Placement ?  ?Existing PASRR number confirmed : 08/04/21          ?Patient chooses bed at: [facility] ?Patient to be transferred to facility by: Car ?Name of family member notified: Daughter ?Patient and family notified of of transfer: 08/04/21 ? ?Discharge Plan and Services ?In-house Referral: Clinical Social Work ?  ?Post Acute Care Choice: [place]          ?  ?  ?  ?  ?  ?  ?  ?  ?  ?  ? ?Social Determinants of Health (SDOH) Interventions ?  ? ? ?Readmission Risk Interventions ?   ? View : No data to display.  ?  ?  ?  ? ? ? ? ? ?

## 2021-08-04 NOTE — TOC Progression Note (Addendum)
Transition of Care (TOC) - Progression Note  ? ? ?Patient Details  ?Name: [name] ?MRN: [id number] ?Date of Birth: 1930-03-06 ? ?Transition of Care (TOC) CM/SW Contact  ?Benard Halsted, LCSW ?Phone Number: ?08/04/2021, 8:29 AM ? ?Clinical Narrative:    ?CSW received insurance approval for [facility]: Ref#, [id number], effective 08/04/2021-08/06/2021. Daughter hoping to take patient by car; will speak with RN to see if patient requires oxygen.  ? ? ?Expected Discharge Plan: [place] ?Barriers to Discharge: SNF Covid, SNF Pending bed offer, Insurance Authorization ? ?Expected Discharge Plan and Services ?Expected Discharge Plan: [place] ?In-house Referral: Clinical Social Work ?  ?Post Acute Care Choice: [place] ?Living arrangements for the past 2 months: Apartment ?                ?  ?  ?  ?  ?  ?  ?  ?  ?  ?  ? ? ?Social Determinants of Health (SDOH) Interventions ?  ? ?Readmission Risk Interventions ?   ? View : No data to display.  ?  ?  ?  ? ? ?

## 2021-08-04 NOTE — Discharge Summary (Addendum)
? ?PATIENT DETAILS ?Name: [name] ?Age: 88 y.o. ?Sex: female ?Date of Birth: 03-03-1930 ?MRN: [id number]. ?Admitting Physician: Orene Desanctis, DO ?URK:YHCW, Emily May, MD ? ?Admit Date: 07/25/2021 ?Discharge date: 08/06/2021 ? ?Recommendations for Outpatient Follow-up:  ?Follow up with PCP in 1-2 weeks ?Please obtain CMP/CBC in one week ?Urine culture pending-please follow-up. ? ?Admitted From:  ?Home ? ?Disposition: ?Skilled nursing facility ?  ?Discharge Condition: ?good ? ?CODE STATUS: ?  Code Status: DNR  ? ?Diet recommendation:  ?Diet Order   ? ?       ?  Diet - low sodium heart healthy       ?  ?  Diet regular Room service appropriate? No; Fluid consistency: Thin  Diet effective now       ?  ? ?  ?  ? ?  ?  ? ?Brief Summary: ?Patient is a 88 y.o.  female with remote history of lung cancer, steroid-dependent RA, COPD, CAD-who presented with shortness of breath-she was thought to have sepsis with hypoxia due to COVID-19 infection and subsequently admitted to the hospitalist service.  See below for further details. ?  ?Significant events: ?4/8>> admit to [facility] for sepsis/hypoxia due to COVID-19 infection. ?4/8>> admit to [facility] for sepsis/hypoxia due to COVID-19 infection. ?4/18>> plan for discharge-however developed fever-discharge held-work-up begun. ?  ?Significant studies: ?4/8>> CXR: No active disease-s/p posttreatment changes on the right lung. ?4/9>> CXR: Findings highly concerning for probable recurrence of right-sided lung cancer with postobstructive changes in the right upper lobe. ?4/13>> CT chest: Stable biapical scarring, stable opacity in the right upper lobe is consistent with posttherapy changes related to history of lung cancer.  Small right pleural effusion.  Right middle lobe opacity-concerning for atelectasis/PNA. ?4/18>> CXR: Horizontal linear opacification in the right midlung unchanged, compared to recent radiographs-improvement in aeration in the right hilar region. ?  ?Significant microbiology  data: ?4/8>> COVID PCR: Positive ?4/8>> blood culture: Negative ?4/10>> stool C. difficile studies: Negative ?4/18>> blood culture: Negative ?4/18>> urine culture: Pending ?  ?Procedures: ?None ?  ?Consults: ?None  ? ?Brief Hospital Course: ?Sepsis: Due to COVID-19 infection/aspiration pneumonia-sepsis physiology has resolved with supportive care.   ?  ?Acute hypoxic respiratory failure due to COVID-19 infection +/- superimposed bacterial pneumonia and COPD exacerbation: Overall improved-on minimal to no oxygen requirement.  Had completed a course of empiric antibiotics/Remdesivir-and discharge was planned on 4/18 for fever to recur.  Cultures negative so far-suspect if she [name] have had another aspiration episode (although chest x-ray not much different than before)-given significant leukocytosis-ongoing low-grade fever-she was empirically started on Unasyn with significant clinical improvement.  No fever for the past 2 days-we will transition to Augmentin for a few more days. ?  ?AKI: Mild-likely hemodynamically mediated kidney injury in the setting of diarrhea/sepsis.  Improving.  ?  ?Diarrhea: C. difficile studies negative-could be due to COVID-19 infection.  Continue with as needed Imodium. ?  ?Steroid-dependent RA: Back on usual dosing of 10 mg of prednisone. ?  ?HTN: BP was soft-hence all antihypertensives on hold-however creeping up-hence Coreg has been resumed. ?  ?Anxiety: Due to acute illness-escalating doses of steroids-continue as needed low-dose Xanax. ?  ?Remote history of lung cancer: CT chest on 4/13-does not show any recurrence of malignancy. ?  ?Debility/deconditioning: Due to acute illness-recommendations are for SNF on discharge. ?  ?BMI: ?Estimated body mass index is 22.87 kg/m? as calculated from the following: ?  Height as of this encounter: 4\' 8"  (1.422 m). ?  Weight  as of this encounter: 46.3 kg.  ?  ?  ?Discharge Diagnoses:  ?Principal Problem: ?  COVID-19 virus infection ?Active  Problems: ?  Sepsis ([place]) ?  Rheumatoid arthritis-severe ?  AKI (acute kidney injury) ([place]) ?  Hypotension ? ? ?Discharge Instructions: ? ?Activity:  ?As tolerated with Full fall precautions use walker/cane & assistance as needed ? ? ?Discharge Instructions   ? ? Call MD for:  difficulty breathing, headache or visual disturbances   Complete by: As directed ?  ? Diet - low sodium heart healthy   Complete by: As directed ?  ? Discharge instructions   Complete by: As directed ?  ? Follow with Primary MD  Emily Neer, MD in 1-2 weeks ? ?Please get a complete blood count and chemistry panel checked by your Primary MD at your next visit, and again as instructed by your Primary MD. ? ?Get Medicines reviewed and adjusted: ?Please take all your medications with you for your next visit with your Primary MD ? ?Laboratory/radiological data: ?Please request your Primary MD to go over all hospital tests and procedure/radiological results at the follow up, please ask your Primary MD to get all Hospital records sent to his/her office. ? ?In some cases, they will be blood work, cultures and biopsy results pending at the time of your discharge. Please request that your primary care M.D. follows up on these results. ? ?Also Note the following: ?If you experience worsening of your admission symptoms, develop shortness of breath, life threatening emergency, suicidal or homicidal thoughts you must seek medical attention immediately by calling 911 or calling your MD immediately  if symptoms less severe. ? ?You must read complete instructions/literature along with all the possible adverse reactions/side effects for all the Medicines you take and that have been prescribed to you. Take any new Medicines after you have completely understood and accpet all the possible adverse reactions/side effects.  ? ?Do not drive when taking Pain medications or sleeping medications (Benzodaizepines) ? ?Do not take more than prescribed Pain, Sleep and  Anxiety Medications. It is not advisable to combine anxiety,sleep and pain medications without talking with your primary care practitioner ? ?Special Instructions: If you have smoked or chewed Tobacco  in the last 2 yrs please stop smoking, stop any regular Alcohol  and or any Recreational drug use. ? ?Wear Seat belts while driving. ? ?Please note: ?You were cared for by a hospitalist during your hospital stay. Once you are discharged, your primary care physician will handle any further medical issues. Please note that NO REFILLS for any discharge medications will be authorized once you are discharged, as it is imperative that you return to your primary care physician (or establish a relationship with a primary care physician if you do not have one) for your post hospital discharge needs so that they can reassess your need for medications and monitor your lab values.  ? Increase activity slowly   Complete by: As directed ?  ? ?  ? ?Allergies as of 08/06/2021   ? ?   Reactions  ? Latex Other (See Comments)  ? Blisters on her mouth  ? Mouthwashes Shortness Of Breath, Other (See Comments)  ? MAGIC MOUTHWASH  ? Darvocet [propoxyphene N-acetaminophen] Nausea And Vomiting  ? Erythromycin Nausea And Vomiting  ? Hydrocodone-acetaminophen Other (See Comments)  ? Dizziness  ? Ibandronic Acid Other (See Comments)  ? Muscle aches  ? Morphine And Related Nausea And Vomiting  ? Oxycodone Hcl Nausea And Vomiting  ?  Oxycontin [oxycodone Hcl] Nausea And Vomiting  ? Sulfa Antibiotics Nausea And Vomiting  ? Tramadol Nausea And Vomiting  ? ?  ? ?  ?Medication List  ?  ? ?STOP taking these medications   ? ?oxyCODONE-acetaminophen 5-325 MG tablet ?Commonly known as: PERCOCET/ROXICET ?  ?senna-docusate 8.6-50 MG tablet ?Commonly known as: Senokot-S ?  ? ?  ? ?TAKE these medications   ? ?acetaminophen 325 MG tablet ?Commonly known as: TYLENOL ?Take 325 mg by mouth every 6 (six) hours as needed for moderate pain. ?  ?ALPRAZolam 0.25 MG  tablet ?Commonly known as: Duanne Moron ?Take 1 tablet (0.25 mg total) by mouth 2 (two) times daily as needed for anxiety. ?  ?amoxicillin-clavulanate 875-125 MG tablet ?Commonly known as: Augmentin ?Take 1 tablet by mouth

## 2021-08-04 NOTE — Progress Notes (Signed)
Plan was for discharge today-however febrile.  Holding discharge-no obvious foci of infection apparent-Per nursing staff no major diarrhea.  Patient denies any dysuria.  She has a cough ever since COVID.  Plan is to obtain UA, blood culture, routine labs and CXR.  Daughter updated regarding plans to hold discharge. ?

## 2021-08-05 DIAGNOSIS — U071 COVID-19: Secondary | ICD-10-CM | POA: Diagnosis not present

## 2021-08-05 DIAGNOSIS — J9601 Acute respiratory failure with hypoxia: Secondary | ICD-10-CM | POA: Diagnosis not present

## 2021-08-05 DIAGNOSIS — M0579 Rheumatoid arthritis with rheumatoid factor of multiple sites without organ or systems involvement: Secondary | ICD-10-CM | POA: Diagnosis not present

## 2021-08-05 DIAGNOSIS — N179 Acute kidney failure, unspecified: Secondary | ICD-10-CM | POA: Diagnosis not present

## 2021-08-05 LAB — GLUCOSE, CAPILLARY
Glucose-Capillary: 83 mg/dL (ref 70–99)
Glucose-Capillary: 217 mg/dL — ABNORMAL HIGH (ref 70–99)
Glucose-Capillary: 144 mg/dL — ABNORMAL HIGH (ref 70–99)
Glucose-Capillary: 169 mg/dL — ABNORMAL HIGH (ref 70–99)
Glucose-Capillary: 102 mg/dL — ABNORMAL HIGH (ref 70–99)

## 2021-08-05 LAB — BASIC METABOLIC PANEL
Anion gap: 5 (ref 5–15)
BUN: 21 mg/dL (ref 8–23)
CO2: 29 mmol/L (ref 22–32)
Calcium: 8.3 mg/dL — ABNORMAL LOW (ref 8.9–10.3)
Chloride: 107 mmol/L (ref 98–111)
Creatinine, Ser: 1.05 mg/dL — ABNORMAL HIGH (ref 0.44–1.00)
GFR, Estimated: 51 mL/min — ABNORMAL LOW (ref >60)
Glucose, Bld: 93 mg/dL (ref 70–99)
Potassium: 4.4 mmol/L (ref 3.5–5.1)
Sodium: 141 mmol/L (ref 135–145)

## 2021-08-05 LAB — CBC
HCT: 30.6 % — ABNORMAL LOW (ref 36.0–46.0)
Hemoglobin: 9.8 g/dL — ABNORMAL LOW (ref 12.0–15.0)
MCH: 33.3 pg (ref 26.0–34.0)
MCHC: 32 g/dL (ref 30.0–36.0)
MCV: 104.1 fL — ABNORMAL HIGH (ref 80.0–100.0)
Platelets: 157 10*3/uL (ref 150–400)
RBC: 2.94 MIL/uL — ABNORMAL LOW (ref 3.87–5.11)
RDW: 14.6 % (ref 11.5–15.5)
WBC: 15.8 10*3/uL — ABNORMAL HIGH (ref 4.0–10.5)
nRBC: 0 % (ref 0.0–0.2)

## 2021-08-05 MED ORDER — SODIUM CHLORIDE 0.9 % IV SOLN
3.0000 g | Freq: Two times a day (BID) | INTRAVENOUS | Status: DC
  Administered 2021-08-05 – 2021-08-06 (×3): 3 g via INTRAVENOUS
  Filled 2021-08-05 (×3): qty 8

## 2021-08-05 NOTE — Progress Notes (Signed)
?      ?                 PROGRESS NOTE ? ?      ?PATIENT DETAILS ?Name: [name] ?Age: 88 y.o. ?Sex: female ?Date of Birth: [birth year] ?Admit Date: 07/25/2021 ?Admitting Physician Orene Desanctis, DO ?WCB:JSEG, [name], MD ? ?Brief Summary: ?Patient is a 88 y.o.  female with remote history of lung cancer, steroid-dependent RA, COPD, CAD-who presented with shortness of breath-she was thought to have sepsis with hypoxia due to COVID-19 infection and subsequently admitted to the hospitalist service.  See below for further details. ? ?Significant events: ?4/8>> admit to [facility] for sepsis/hypoxia due to COVID-19 infection. ?4/18>> plan for discharge-however developed fever-discharge held-work-up begun. ? ?Significant studies: ?4/8>> CXR: No active disease-s/p posttreatment changes on the right lung. ?4/9>> CXR: Findings highly concerning for probable recurrence of right-sided lung cancer with postobstructive changes in the right upper lobe. ?4/13>> CT chest: Stable biapical scarring, stable opacity in the right upper lobe is consistent with posttherapy changes related to history of lung cancer.  Small right pleural effusion.  Right middle lobe opacity-concerning for atelectasis/PNA. ?4/18>> CXR: Horizontal linear opacification in the right midlung unchanged, compared to recent radiographs-improvement in aeration in the right hilar region. ? ?Significant microbiology data: ?4/8>> COVID PCR: Positive ?4/8>> blood culture: Negative ?4/10>> stool C. difficile studies: Negative ?4/18>> blood culture: Negative ?4/18>> urine culture: Pending ? ? ?Procedures: ?None ? ?Consults: ?None  ? ?Subjective: ?Low-grade fever this morning.  Denies any vomiting.  Per nursing staff-no diarrhea.  Denies any chest pain or shortness of breath.  Some cough continues. ? ?Objective: ?Vitals: ?Blood pressure (!) 106/46, pulse 87, temperature 98.8 ?F (37.1 ?C), temperature source Oral, resp. rate 20, height 4\' 8"  (1.422 m), weight 46.3 kg, SpO2 95 %.   ? ?Exam: ?Gen Exam:Alert awake-not in any distress ?HEENT:atraumatic, normocephalic ?Chest: B/L clear to auscultation anteriorly ?CVS:S1S2 regular ?Abdomen:soft non tender, non distended ?Extremities:no edema ?Neurology: Non focal ?Skin: no rash  ? ?Pertinent Labs/Radiology: ? ?  Latest Ref Rng & Units 08/05/2021  ?  7:10 AM 08/04/2021  ?  2:19 PM 07/30/2021  ?  2:17 AM  ?CBC  ?WBC 4.0 - 10.5 K/uL 15.8   19.4   9.9    ?Hemoglobin 12.0 - 15.0 g/dL 9.8   10.6   10.3    ?Hematocrit 36.0 - 46.0 % 30.6   34.1   33.0    ?Platelets 150 - 400 K/uL 157   198   135    ?  ?Lab Results  ?Component Value Date  ? NA 141 08/05/2021  ? K 4.4 08/05/2021  ? CL 107 08/05/2021  ? CO2 29 08/05/2021  ? ?  ? ? ?Assessment/Plan: ?Sepsis: Due to COVID-19 infection/aspiration pneumonia-sepsis physiology has resolved with supportive care.   ? ?Acute hypoxic respiratory failure due to COVID-19 infection +/- superimposed bacterial pneumonia and COPD exacerbation: Overall improved-on minimal to no oxygen requirement.  Had completed a course of empiric antibiotics/Remdesivir-and discharge was planned on 4/18 for fever to recur.  Cultures negative so far-suspect if she may have had another aspiration episode-given significant leukocytosis-ongoing low-grade fever-we will empirically treat with Unasyn/clinical response. ? ?AKI: Mild-likely hemodynamically mediated kidney injury in the setting of diarrhea/sepsis.  Improving.  ? ?Diarrhea: C. difficile studies negative-could be due to COVID-19 infection.  Continue with as needed Imodium. ? ?Steroid-dependent RA: Back on usual dosing of 10 mg of prednisone. ? ?HTN: BP slowly creeping up-Coreg  resumed. ? ?Anxiety: Due to acute illness-escalating doses of steroids-low-dose as needed Xanax started which seems to help.   ? ?Remote history of lung cancer: CT chest on 4/13-does not show any recurrence of malignancy. ? ?Debility/deconditioning: Due to acute illness-recommendations are for SNF on  discharge. ? ?BMI: ?Estimated body mass index is 22.87 kg/m? as calculated from the following: ?  Height as of this encounter: 4\' 8"  (1.422 m). ?  Weight as of this encounter: 46.3 kg.  ? ?Code status: ?  Code Status: DNR  ? ?DVT Prophylaxis: ?enoxaparin (LOVENOX) injection 30 mg Start: 07/26/21 1000 ?  ?Family Communication: Daughter-[name]-[phone]-on 4/19 ? ? ?Disposition Plan: ?Status is: Inpatient ?Remains inpatient appropriate because: SNF in the next 1-2 days if fever/leukocytosis continues to improve. ?  ?Planned Discharge Destination:Skilled nursing facility ? ? ?Diet: ?Diet Order   ? ?       ?  Diet - low sodium heart healthy       ?  ?  Diet regular Room service appropriate? No; Fluid consistency: Thin  Diet effective now       ?  ? ?  ?  ? ?  ?  ? ? ?Antimicrobial agents: ?Anti-infectives (From admission, onward)  ? ? Start     Dose/Rate Route Frequency Ordered Stop  ? 08/05/21 1100  Ampicillin-Sulbactam (UNASYN) 3 g in sodium chloride 0.9 % 100 mL IVPB       ? 3 g ?200 mL/hr over 30 Minutes Intravenous Every 12 hours 08/05/21 1010    ? 07/27/21 1000  remdesivir 100 mg in sodium chloride 0.9 % 100 mL IVPB       ?See Hyperspace for full Linked Orders Report.  ? 100 mg ?200 mL/hr over 30 Minutes Intravenous Daily 07/26/21 1138 07/30/21 1050  ? 07/27/21 0930  cefTRIAXone (ROCEPHIN) 1 g in sodium chloride 0.9 % 100 mL IVPB  Status:  Discontinued       ? 1 g ?200 mL/hr over 30 Minutes Intravenous Every 24 hours 07/27/21 0835 07/31/21 1253  ? 07/27/21 0930  azithromycin (ZITHROMAX) 500 mg in sodium chloride 0.9 % 250 mL IVPB       ? 500 mg ?250 mL/hr over 60 Minutes Intravenous Every 24 hours 07/27/21 0835 07/31/21 1241  ? 07/26/21 2200  molnupiravir EUA (LAGEVRIO) capsule 800 mg  Status:  Discontinued       ? 4 capsule Oral 2 times daily 07/26/21 0955 07/26/21 1139  ? 07/26/21 1230  remdesivir 200 mg in sodium chloride 0.9% 250 mL IVPB       ?See Hyperspace for full Linked Orders Report.  ? 200 mg ?580  mL/hr over 30 Minutes Intravenous Once 07/26/21 1138 07/26/21 1349  ? 07/26/21 1045  molnupiravir EUA (LAGEVRIO) capsule 800 mg  Status:  Discontinued       ? 4 capsule Oral 2 times daily 07/26/21 0945 07/26/21 0955  ? 07/25/21 2359  nirmatrelvir/ritonavir EUA (renal dosing) (PAXLOVID) 2 tablet  Status:  Discontinued       ? 2 tablet Oral 2 times daily 07/25/21 2303 07/26/21 0945  ? 07/25/21 2300  nirmatrelvir/ritonavir EUA (PAXLOVID) 3 tablet  Status:  Discontinued       ? 3 tablet Oral 2 times daily 07/25/21 2259 07/25/21 2303  ? 07/25/21 1915  cefTRIAXone (ROCEPHIN) 2 g in sodium chloride 0.9 % 100 mL IVPB  Status:  Discontinued       ? 2 g ?200 mL/hr over 30 Minutes Intravenous Every 24 hours 07/25/21  1901 07/25/21 2259  ? 07/25/21 1915  doxycycline (VIBRAMYCIN) 100 mg in sodium chloride 0.9 % 250 mL IVPB  Status:  Discontinued       ? 100 mg ?125 mL/hr over 120 Minutes Intravenous Every 12 hours 07/25/21 1901 07/25/21 2259  ? ?  ? ? ? ?MEDICATIONS: ?Scheduled Meds: ? carvedilol  3.125 mg Oral BID WC  ? enoxaparin (LOVENOX) injection  30 mg Subcutaneous Daily  ? Ipratropium-Albuterol  1 puff Inhalation BID  ? mometasone-formoterol  2 puff Inhalation BID  ? predniSONE  10 mg Oral Q breakfast  ? ?Continuous Infusions: ? ampicillin-sulbactam (UNASYN) IV 3 g (08/05/21 1139)  ? ? ?PRN Meds:.acetaminophen, albuterol, ALPRAZolam, dextromethorphan-guaiFENesin, hydrALAZINE, loperamide, ondansetron (ZOFRAN) IV, senna-docusate, umeclidinium-vilanterol ? ? ?I have personally reviewed following labs and imaging studies ? ?LABORATORY DATA: ?CBC: ?Recent Labs  ?Lab 07/30/21 ?0217 08/04/21 ?1419 08/05/21 ?0710  ?WBC 9.9 19.4* 15.8*  ?NEUTROABS 8.8*  --   --   ?HGB 10.3* 10.6* 9.8*  ?HCT 33.0* 34.1* 30.6*  ?MCV 103.1* 103.6* 104.1*  ?PLT 135* 198 157  ? ? ? ?Basic Metabolic Panel: ?Recent Labs  ?Lab 07/30/21 ?0217 07/31/21 ?0231 08/04/21 ?1419 08/05/21 ?0710  ?NA 141 139 140 141  ?K 3.7 4.8 4.7 4.4  ?CL 110 108 106 107  ?CO2 23  25 26 29   ?GLUCOSE 224* 157* 129* 93  ?BUN 62* 52* 28* 21  ?CREATININE 1.29* 1.30* 1.09* 1.05*  ?CALCIUM 8.4* 8.0* 8.6* 8.3*  ?MG 2.6* 2.6*  --   --   ? ? ? ?GFR: ?Estimated Creatinine Clearance: 23.1 mL/min (A) (by C

## 2021-08-05 NOTE — Progress Notes (Signed)
Pharmacy Antibiotic Note ? ?88 y.o. female with remote history of lung cancer, steroid-dependent RA, COPD, CAD who presented with shortness of breath and found to have COVID-19 infection. Patient now with new fevers overnight. Pharmacy has been consulted for Unasyn dosing. ? ?Plan: ?Unasyn 3g Q12h ?Follow-up clinical improvement, transition to PO when able ? ?Height: 4\' 8"  (142.2 cm) ?Weight: 46.3 kg (102 lb) ?IBW/kg (Calculated) : 36.3 ? ?Temp (24hrs), Avg:99.9 ?F (37.7 ?C), Min:98.9 ?F (37.2 ?C), Max:101.3 ?F (38.5 ?C) ? ?Recent Labs  ?Lab 07/30/21 ?0217 07/31/21 ?0231 08/04/21 ?1419 08/05/21 ?0710  ?WBC 9.9  --  19.4* 15.8*  ?CREATININE 1.29* 1.30* 1.09* 1.05*  ?  ?Estimated Creatinine Clearance: 23.1 mL/min (A) (by C-G formula based on SCr of 1.05 mg/dL (H)).   ? ?Allergies  ?Allergen Reactions  ? Latex Other (See Comments)  ?  Blisters on her mouth  ? Mouthwashes Shortness Of Breath and Other (See Comments)  ?  MAGIC MOUTHWASH  ? Darvocet [Propoxyphene N-Acetaminophen] Nausea And Vomiting  ? Erythromycin Nausea And Vomiting  ? Hydrocodone-Acetaminophen Other (See Comments)  ?  Dizziness ?  ? Ibandronic Acid Other (See Comments)  ?  Muscle aches  ? Morphine And Related Nausea And Vomiting  ? Oxycodone Hcl Nausea And Vomiting  ? Oxycontin [Oxycodone Hcl] Nausea And Vomiting  ? Sulfa Antibiotics Nausea And Vomiting  ? Tramadol Nausea And Vomiting  ? ? ?Thank you for allowing pharmacy to be a part of this patient?s care. ? ?Ardyth Harps, PharmD ?Clinical Pharmacist ? ? ?

## 2021-08-06 DIAGNOSIS — J9601 Acute respiratory failure with hypoxia: Secondary | ICD-10-CM | POA: Diagnosis not present

## 2021-08-06 DIAGNOSIS — U071 COVID-19: Secondary | ICD-10-CM | POA: Diagnosis not present

## 2021-08-06 DIAGNOSIS — N179 Acute kidney failure, unspecified: Secondary | ICD-10-CM | POA: Diagnosis not present

## 2021-08-06 LAB — GLUCOSE, CAPILLARY
Glucose-Capillary: 115 mg/dL — ABNORMAL HIGH (ref 70–99)
Glucose-Capillary: 98 mg/dL (ref 70–99)

## 2021-08-06 LAB — URINE CULTURE

## 2021-08-06 MED ORDER — AMOXICILLIN-POT CLAVULANATE 875-125 MG PO TABS: 1.0000 | ORAL_TABLET | Freq: Two times a day (BID) | ORAL | 0 refills | Status: AC

## 2021-08-06 NOTE — TOC Transition Note (Signed)
Transition of Care (TOC) - CM/SW Discharge Note ? ? ?Patient Details  ?Name: [name] ?MRN: [id number] ?Date of Birth: [birth year] ? ?Transition of Care (TOC) CM/SW Contact:  ?Benard Halsted, LCSW ?Phone Number: ?08/06/2021, 11:14 AM ? ? ?Clinical Narrative:    ?Patient will DC to: [facility] ?Anticipated DC date: 08/06/21 ?Family notified: Daughter, [name] ?Transport by: Daughter by car ? ? ?Per MD patient ready for DC to [facility]. RN to call report prior to discharge [phone]). RN, patient, patient's family, and facility notified of DC. Discharge Summary and FL2 sent to facility. DC packet on chart including signed script and DNR.  ? ?CSW will sign off for now as social work intervention is no longer needed. Please consult [place] again if new needs arise. ? ? ? ? ?Final next level of care: [place] ?Barriers to Discharge: Barriers Resolved ? ? ?Patient Goals and CMS Choice ?Patient states their goals for this hospitalization and ongoing recovery are:: Rehab ?CMS Medicare.gov Compare Post Acute Care list provided to:: Patient Represenative (must comment) ?Choice offered to / list presented to : Adult Children, Patient ? ?Discharge Placement ?  ?Existing PASRR number confirmed : 08/04/21          ?Patient chooses bed at: [facility] ?Patient to be transferred to facility by: Car ?Name of family member notified: Daughter ?Patient and family notified of of transfer: 08/06/21 ? ?Discharge Plan and Services ?In-house Referral: Clinical Social Work ?  ?Post Acute Care Choice: [place]          ?  ?  ?  ?  ?  ?  ?  ?  ?  ?  ? ?Social Determinants of Health (SDOH) Interventions ?  ? ? ?Readmission Risk Interventions ?   ? View : No data to display.  ?  ?  ?  ? ? ? ? ? ?

## 2021-08-06 NOTE — Progress Notes (Signed)
Report called to [facility] at [facility]. All questions answered. PIV removed prior to d/c. Patient discharged via private auto.  ?

## 2021-08-09 LAB — CULTURE, BLOOD (ROUTINE X 2)
Culture: NO GROWTH
Culture: NO GROWTH
Special Requests: ADEQUATE
Special Requests: ADEQUATE

## 2021-08-12 ENCOUNTER — Ambulatory Visit: Payer: Medicare Other | Admitting: Rheumatology

## 2021-08-13 ENCOUNTER — Ambulatory Visit: Payer: Medicare Other | Admitting: Physician Assistant

## 2021-08-13 DIAGNOSIS — Z8709 Personal history of other diseases of the respiratory system: Secondary | ICD-10-CM

## 2021-08-13 DIAGNOSIS — M19041 Primary osteoarthritis, right hand: Secondary | ICD-10-CM

## 2021-08-13 DIAGNOSIS — M19071 Primary osteoarthritis, right ankle and foot: Secondary | ICD-10-CM

## 2021-08-13 DIAGNOSIS — M810 Age-related osteoporosis without current pathological fracture: Secondary | ICD-10-CM

## 2021-08-13 DIAGNOSIS — I214 Non-ST elevation (NSTEMI) myocardial infarction: Secondary | ICD-10-CM

## 2021-08-13 DIAGNOSIS — I700 Atherosclerosis of aorta: Secondary | ICD-10-CM

## 2021-08-13 DIAGNOSIS — M0630 Rheumatoid nodule, unspecified site: Secondary | ICD-10-CM

## 2021-08-13 DIAGNOSIS — Z79899 Other long term (current) drug therapy: Secondary | ICD-10-CM

## 2021-08-13 DIAGNOSIS — M0609 Rheumatoid arthritis without rheumatoid factor, multiple sites: Secondary | ICD-10-CM

## 2021-08-13 DIAGNOSIS — Z85118 Personal history of other malignant neoplasm of bronchus and lung: Secondary | ICD-10-CM

## 2021-08-25 ENCOUNTER — Ambulatory Visit: Payer: Medicare Other | Admitting: Podiatry

## 2021-09-10 ENCOUNTER — Ambulatory Visit: Payer: Medicare Other | Admitting: Physician Assistant

## 2021-09-10 DIAGNOSIS — I214 Non-ST elevation (NSTEMI) myocardial infarction: Secondary | ICD-10-CM

## 2021-09-10 DIAGNOSIS — M19072 Primary osteoarthritis, left ankle and foot: Secondary | ICD-10-CM

## 2021-09-10 DIAGNOSIS — Z85118 Personal history of other malignant neoplasm of bronchus and lung: Secondary | ICD-10-CM

## 2021-09-10 DIAGNOSIS — I700 Atherosclerosis of aorta: Secondary | ICD-10-CM

## 2021-09-10 DIAGNOSIS — Z8709 Personal history of other diseases of the respiratory system: Secondary | ICD-10-CM

## 2021-09-10 DIAGNOSIS — M0630 Rheumatoid nodule, unspecified site: Secondary | ICD-10-CM

## 2021-09-10 DIAGNOSIS — M0609 Rheumatoid arthritis without rheumatoid factor, multiple sites: Secondary | ICD-10-CM

## 2021-09-10 DIAGNOSIS — M810 Age-related osteoporosis without current pathological fracture: Secondary | ICD-10-CM

## 2021-09-10 DIAGNOSIS — Z79899 Other long term (current) drug therapy: Secondary | ICD-10-CM

## 2021-09-10 DIAGNOSIS — M19041 Primary osteoarthritis, right hand: Secondary | ICD-10-CM

## 2021-10-26 ENCOUNTER — Ambulatory Visit: Payer: Medicare Other | Admitting: Podiatry

## 2021-11-02 ENCOUNTER — Ambulatory Visit: Payer: Medicare Other | Admitting: Physician Assistant

## 2021-12-11 ENCOUNTER — Inpatient Hospital Stay (HOSPITAL_COMMUNITY)
Admission: EM | Admit: 2021-12-11 | Discharge: 2021-12-14 | DRG: 291 | Disposition: A | Discharge status: Skilled Nursing Facility | Payer: Medicare Other | Source: Skilled Nursing Facility | Attending: Internal Medicine | Admitting: Internal Medicine

## 2021-12-11 ENCOUNTER — Encounter (HOSPITAL_COMMUNITY): Payer: Self-pay

## 2021-12-11 ENCOUNTER — Emergency Department (HOSPITAL_COMMUNITY): Payer: Medicare Other

## 2021-12-11 DIAGNOSIS — I248 Other forms of acute ischemic heart disease: Secondary | ICD-10-CM | POA: Diagnosis present

## 2021-12-11 DIAGNOSIS — Z8616 Personal history of COVID-19: Secondary | ICD-10-CM

## 2021-12-11 DIAGNOSIS — Z9109 Other allergy status, other than to drugs and biological substances: Secondary | ICD-10-CM

## 2021-12-11 DIAGNOSIS — I110 Hypertensive heart disease with heart failure: Principal | ICD-10-CM | POA: Diagnosis present

## 2021-12-11 DIAGNOSIS — D638 Anemia in other chronic diseases classified elsewhere: Secondary | ICD-10-CM | POA: Diagnosis present

## 2021-12-11 DIAGNOSIS — Z20822 Contact with and (suspected) exposure to covid-19: Secondary | ICD-10-CM | POA: Diagnosis present

## 2021-12-11 DIAGNOSIS — Z7952 Long term (current) use of systemic steroids: Secondary | ICD-10-CM

## 2021-12-11 DIAGNOSIS — Z87891 Personal history of nicotine dependence: Secondary | ICD-10-CM

## 2021-12-11 DIAGNOSIS — M810 Age-related osteoporosis without current pathological fracture: Secondary | ICD-10-CM | POA: Diagnosis present

## 2021-12-11 DIAGNOSIS — I252 Old myocardial infarction: Secondary | ICD-10-CM

## 2021-12-11 DIAGNOSIS — J449 Chronic obstructive pulmonary disease, unspecified: Secondary | ICD-10-CM | POA: Diagnosis present

## 2021-12-11 DIAGNOSIS — Z85118 Personal history of other malignant neoplasm of bronchus and lung: Secondary | ICD-10-CM

## 2021-12-11 DIAGNOSIS — F0393 Unspecified dementia, unspecified severity, with mood disturbance: Secondary | ICD-10-CM | POA: Diagnosis present

## 2021-12-11 DIAGNOSIS — Z923 Personal history of irradiation: Secondary | ICD-10-CM

## 2021-12-11 DIAGNOSIS — J9601 Acute respiratory failure with hypoxia: Secondary | ICD-10-CM | POA: Diagnosis present

## 2021-12-11 DIAGNOSIS — Z66 Do not resuscitate: Secondary | ICD-10-CM | POA: Diagnosis present

## 2021-12-11 DIAGNOSIS — J441 Chronic obstructive pulmonary disease with (acute) exacerbation: Secondary | ICD-10-CM | POA: Diagnosis not present

## 2021-12-11 DIAGNOSIS — Z7951 Long term (current) use of inhaled steroids: Secondary | ICD-10-CM

## 2021-12-11 DIAGNOSIS — Z881 Allergy status to other antibiotic agents status: Secondary | ICD-10-CM

## 2021-12-11 DIAGNOSIS — I509 Heart failure, unspecified: Secondary | ICD-10-CM

## 2021-12-11 DIAGNOSIS — I5033 Acute on chronic diastolic (congestive) heart failure: Secondary | ICD-10-CM

## 2021-12-11 DIAGNOSIS — Z885 Allergy status to narcotic agent status: Secondary | ICD-10-CM

## 2021-12-11 DIAGNOSIS — Z91040 Latex allergy status: Secondary | ICD-10-CM

## 2021-12-11 DIAGNOSIS — I2584 Coronary atherosclerosis due to calcified coronary lesion: Secondary | ICD-10-CM

## 2021-12-11 DIAGNOSIS — R0902 Hypoxemia: Secondary | ICD-10-CM | POA: Diagnosis not present

## 2021-12-11 DIAGNOSIS — K219 Gastro-esophageal reflux disease without esophagitis: Secondary | ICD-10-CM | POA: Diagnosis present

## 2021-12-11 DIAGNOSIS — E7800 Pure hypercholesterolemia, unspecified: Secondary | ICD-10-CM | POA: Diagnosis present

## 2021-12-11 DIAGNOSIS — R419 Unspecified symptoms and signs involving cognitive functions and awareness: Secondary | ICD-10-CM | POA: Diagnosis present

## 2021-12-11 DIAGNOSIS — F339 Major depressive disorder, recurrent, unspecified: Secondary | ICD-10-CM | POA: Diagnosis present

## 2021-12-11 DIAGNOSIS — L89302 Pressure ulcer of unspecified buttock, stage 2: Secondary | ICD-10-CM | POA: Diagnosis present

## 2021-12-11 DIAGNOSIS — R54 Age-related physical debility: Secondary | ICD-10-CM | POA: Diagnosis present

## 2021-12-11 DIAGNOSIS — J9621 Acute and chronic respiratory failure with hypoxia: Secondary | ICD-10-CM | POA: Diagnosis present

## 2021-12-11 DIAGNOSIS — M069 Rheumatoid arthritis, unspecified: Secondary | ICD-10-CM | POA: Diagnosis present

## 2021-12-11 DIAGNOSIS — M0579 Rheumatoid arthritis with rheumatoid factor of multiple sites without organ or systems involvement: Secondary | ICD-10-CM

## 2021-12-11 DIAGNOSIS — I2510 Atherosclerotic heart disease of native coronary artery without angina pectoris: Secondary | ICD-10-CM | POA: Diagnosis present

## 2021-12-11 DIAGNOSIS — Z79899 Other long term (current) drug therapy: Secondary | ICD-10-CM

## 2021-12-11 DIAGNOSIS — Z515 Encounter for palliative care: Secondary | ICD-10-CM

## 2021-12-11 DIAGNOSIS — R531 Weakness: Secondary | ICD-10-CM | POA: Diagnosis present

## 2021-12-11 LAB — BLOOD GAS, VENOUS
Acid-Base Excess: 12.5 mmol/L — ABNORMAL HIGH (ref 0.0–2.0)
Bicarbonate: 38.5 mmol/L — ABNORMAL HIGH (ref 20.0–28.0)
O2 Saturation: 75.6 %
Patient temperature: 36
pCO2, Ven: 56 mmHg (ref 44–60)
pH, Ven: 7.44 — ABNORMAL HIGH (ref 7.25–7.43)
pO2, Ven: 42 mmHg (ref 32–45)

## 2021-12-11 LAB — CBC WITH DIFFERENTIAL/PLATELET
Abs Immature Granulocytes: 0.07 10*3/uL (ref 0.00–0.07)
Basophils Absolute: 0 10*3/uL (ref 0.0–0.1)
Basophils Relative: 0 %
Eosinophils Absolute: 0 10*3/uL (ref 0.0–0.5)
Eosinophils Relative: 0 %
HCT: 30.5 % — ABNORMAL LOW (ref 36.0–46.0)
Hemoglobin: 9.3 g/dL — ABNORMAL LOW (ref 12.0–15.0)
Immature Granulocytes: 1 %
Lymphocytes Relative: 8 %
Lymphs Abs: 1 10*3/uL (ref 0.7–4.0)
MCH: 32.7 pg (ref 26.0–34.0)
MCHC: 30.5 g/dL (ref 30.0–36.0)
MCV: 107.4 fL — ABNORMAL HIGH (ref 80.0–100.0)
Monocytes Absolute: 1 10*3/uL (ref 0.1–1.0)
Monocytes Relative: 8 %
Neutro Abs: 10.1 10*3/uL — ABNORMAL HIGH (ref 1.7–7.7)
Neutrophils Relative %: 83 %
Platelets: 203 10*3/uL (ref 150–400)
RBC: 2.84 MIL/uL — ABNORMAL LOW (ref 3.87–5.11)
RDW: 14.3 % (ref 11.5–15.5)
WBC: 12.2 10*3/uL — ABNORMAL HIGH (ref 4.0–10.5)
nRBC: 0 % (ref 0.0–0.2)

## 2021-12-11 LAB — RESP PANEL BY RT-PCR (FLU A&B, COVID) ARPGX2
Influenza A by PCR: NEGATIVE
Influenza B by PCR: NEGATIVE
SARS Coronavirus 2 by RT PCR: NEGATIVE

## 2021-12-11 LAB — COMPREHENSIVE METABOLIC PANEL
ALT: 14 U/L (ref 0–44)
AST: 18 U/L (ref 15–41)
Albumin: 3.2 g/dL — ABNORMAL LOW (ref 3.5–5.0)
Alkaline Phosphatase: 44 U/L (ref 38–126)
Anion gap: 9 (ref 5–15)
BUN: 34 mg/dL — ABNORMAL HIGH (ref 8–23)
CO2: 32 mmol/L (ref 22–32)
Calcium: 8.9 mg/dL (ref 8.9–10.3)
Chloride: 103 mmol/L (ref 98–111)
Creatinine, Ser: 1.09 mg/dL — ABNORMAL HIGH (ref 0.44–1.00)
GFR, Estimated: 49 mL/min — ABNORMAL LOW (ref >60)
Glucose, Bld: 113 mg/dL — ABNORMAL HIGH (ref 70–99)
Potassium: 4.5 mmol/L (ref 3.5–5.1)
Sodium: 144 mmol/L (ref 135–145)
Total Bilirubin: 0.5 mg/dL (ref 0.3–1.2)
Total Protein: 6.5 g/dL (ref 6.5–8.1)

## 2021-12-11 LAB — PROTIME-INR
INR: 1 (ref 0.8–1.2)
Prothrombin Time: 13.1 seconds (ref 11.4–15.2)

## 2021-12-11 LAB — TROPONIN I (HIGH SENSITIVITY): Troponin I (High Sensitivity): 57 ng/L — ABNORMAL HIGH (ref <18)

## 2021-12-11 LAB — BRAIN NATRIURETIC PEPTIDE: B Natriuretic Peptide: 1699.1 pg/mL — ABNORMAL HIGH (ref 0.0–100.0)

## 2021-12-11 MED ORDER — POLYETHYLENE GLYCOL 3350 17 G PO PACK: 17.0000 g | PACK | Freq: Every day | ORAL | Status: DC | PRN

## 2021-12-11 MED ORDER — IPRATROPIUM-ALBUTEROL 0.5-2.5 (3) MG/3ML IN SOLN
3.0000 mL | Freq: Once | RESPIRATORY_TRACT | Status: AC
  Administered 2021-12-11: 3 mL via RESPIRATORY_TRACT
  Filled 2021-12-11: qty 3

## 2021-12-11 MED ORDER — ACETAMINOPHEN 325 MG PO TABS: 650.0000 mg | ORAL_TABLET | Freq: Four times a day (QID) | ORAL | Status: DC | PRN

## 2021-12-11 MED ORDER — SODIUM CHLORIDE 0.9% FLUSH
3.0000 mL | Freq: Two times a day (BID) | INTRAVENOUS | Status: DC
Start: 2021-12-11 — End: 2021-12-15
  Administered 2021-12-12 – 2021-12-14 (×4): 3 mL via INTRAVENOUS

## 2021-12-11 MED ORDER — IPRATROPIUM BROMIDE 0.02 % IN SOLN
0.5000 mg | Freq: Four times a day (QID) | RESPIRATORY_TRACT | Status: DC
  Administered 2021-12-11 – 2021-12-12 (×2): 0.5 mg via RESPIRATORY_TRACT
  Filled 2021-12-11 (×2): qty 2.5

## 2021-12-11 MED ORDER — ESCITALOPRAM OXALATE 10 MG PO TABS
10.0000 mg | ORAL_TABLET | Freq: Every day | ORAL | Status: DC
  Administered 2021-12-12 – 2021-12-14 (×3): 10 mg via ORAL
  Filled 2021-12-11 (×3): qty 1

## 2021-12-11 MED ORDER — ENOXAPARIN SODIUM 30 MG/0.3ML IJ SOSY
30.0000 mg | PREFILLED_SYRINGE | INTRAMUSCULAR | Status: DC
  Administered 2021-12-13: 30 mg via SUBCUTANEOUS
  Filled 2021-12-11 (×2): qty 0.3

## 2021-12-11 MED ORDER — TRAZODONE HCL 100 MG PO TABS
100.0000 mg | ORAL_TABLET | Freq: Every day | ORAL | Status: DC
  Administered 2021-12-11 – 2021-12-13 (×3): 100 mg via ORAL
  Filled 2021-12-11 (×3): qty 1

## 2021-12-11 MED ORDER — SODIUM CHLORIDE 0.9 % IV SOLN
100.0000 mg | Freq: Once | INTRAVENOUS | Status: AC
  Administered 2021-12-11: 100 mg via INTRAVENOUS
  Filled 2021-12-11: qty 100

## 2021-12-11 MED ORDER — LORAZEPAM 0.5 MG PO TABS
0.5000 mg | ORAL_TABLET | Freq: Four times a day (QID) | ORAL | Status: DC | PRN
  Administered 2021-12-13: 0.5 mg via ORAL
  Filled 2021-12-11 (×2): qty 1

## 2021-12-11 MED ORDER — METHYLPREDNISOLONE SODIUM SUCC 125 MG IJ SOLR
125.0000 mg | Freq: Once | INTRAMUSCULAR | Status: AC
  Administered 2021-12-11: 125 mg via INTRAVENOUS
  Filled 2021-12-11: qty 2

## 2021-12-11 MED ORDER — CARVEDILOL 3.125 MG PO TABS
3.1250 mg | ORAL_TABLET | Freq: Two times a day (BID) | ORAL | Status: DC
  Administered 2021-12-12 – 2021-12-14 (×6): 3.125 mg via ORAL
  Filled 2021-12-11 (×6): qty 1

## 2021-12-11 MED ORDER — PREDNISONE 20 MG PO TABS
40.0000 mg | ORAL_TABLET | Freq: Every day | ORAL | Status: DC
  Administered 2021-12-12 – 2021-12-14 (×3): 40 mg via ORAL
  Filled 2021-12-11 (×3): qty 2

## 2021-12-11 MED ORDER — FAMOTIDINE 20 MG PO TABS
20.0000 mg | ORAL_TABLET | Freq: Every day | ORAL | Status: DC
  Administered 2021-12-12 – 2021-12-14 (×3): 20 mg via ORAL
  Filled 2021-12-11 (×3): qty 1

## 2021-12-11 MED ORDER — ACETAMINOPHEN 650 MG RE SUPP: 650.0000 mg | Freq: Four times a day (QID) | RECTAL | Status: DC | PRN

## 2021-12-11 MED ORDER — ALBUTEROL SULFATE (2.5 MG/3ML) 0.083% IN NEBU
2.5000 mg | INHALATION_SOLUTION | RESPIRATORY_TRACT | Status: DC | PRN
  Administered 2021-12-12 (×2): 2.5 mg via RESPIRATORY_TRACT
  Filled 2021-12-11 (×3): qty 3

## 2021-12-11 MED ORDER — FUROSEMIDE 10 MG/ML IJ SOLN
40.0000 mg | Freq: Once | INTRAMUSCULAR | Status: AC
  Administered 2021-12-11: 40 mg via INTRAVENOUS
  Filled 2021-12-11: qty 4

## 2021-12-11 MED ORDER — SODIUM CHLORIDE 0.9 % IV SOLN
1.0000 g | Freq: Once | INTRAVENOUS | Status: AC
  Administered 2021-12-11: 1 g via INTRAVENOUS
  Filled 2021-12-11: qty 10

## 2021-12-11 MED ORDER — QUETIAPINE FUMARATE 25 MG PO TABS
25.0000 mg | ORAL_TABLET | Freq: Every day | ORAL | Status: DC
  Administered 2021-12-11 – 2021-12-13 (×3): 25 mg via ORAL
  Filled 2021-12-11 (×3): qty 1

## 2021-12-11 MED ORDER — FUROSEMIDE 10 MG/ML IJ SOLN
40.0000 mg | Freq: Two times a day (BID) | INTRAMUSCULAR | Status: DC
  Administered 2021-12-12 (×2): 40 mg via INTRAVENOUS
  Filled 2021-12-11 (×2): qty 4

## 2021-12-11 NOTE — ED Notes (Signed)
Pt desat to 31 and non compliant with . Pt placed on nonrebreather at 8 L

## 2021-12-11 NOTE — ED Triage Notes (Addendum)
Pt BIB [facility] from [facility]. Staff states pt had nagging cough that has progressed to shortness of breath. Pt c/o SOB and cough.  Pt hx of CHF.

## 2021-12-11 NOTE — ED Provider Notes (Signed)
[facility] DEPT Provider Note  CSN: [id number] Arrival date & time: 12/11/21 1928  Chief Complaint(s) Shortness of Breath  HPI [name] is a 88 y.o. female presenting to the emergency department with shortness of breath.  History is limited from the patient but she does report shortness of breath.  Per daughter, patient was at her facility, seemed to have some difficulty breathing, received a "antianxiety" medicine which seemed to actually have made her symptoms worse.  Patient's daughter reports that she has had some bilateral lower extremity swelling which is worsening.  Patient does use oxygen at home although the daughter is unsure how much oxygen she uses.  Patient is on hospice for COPD, is DNR and DNI, patient's daughter would like her to be treated given this acute exacerbation with potential for improvement.  History otherwise limited by dementia   Past Medical History Past Medical History:  Diagnosis Date   AKI (acute kidney injury) ([place]) 07/25/2021   Cancer ([place])    lung ca   COPD (chronic obstructive pulmonary disease) ([place])    COVID-19 virus infection 04/24/9676   Hernia, umbilical    Hip pain, right    History of radiation therapy 05/02/12-05/04/12,&05/09/12   rul lung 54Gy/46fx   HLD (hyperlipidemia)    Hypotension 07/25/2021   Lung mass    Malignant neoplasm of lung ([place]) 04/01/2020   Myocardial infarction ([place]) 08/2017   Osteoporosis    PAC (premature atrial contraction)    RA (rheumatoid arthritis) ([place])    Shingles    Patient Active Problem List   Diagnosis Date Noted   COPD with acute exacerbation ([place]) 12/11/2021   Acute on chronic diastolic CHF (congestive heart failure) ([place]) 12/11/2021   Acute respiratory failure with hypoxia ([place]) 12/11/2021   Sepsis ([place]) 07/25/2021   Plantar wart 04/21/2021   Spinal stenosis 04/21/2021   Recurrent major depression in remission ([place]) 02/19/2021   Acute non-ST segment elevation myocardial  infarction ([place]) 04/01/2020   Ataxia 04/01/2020   Frail elderly 04/01/2020   Heart disease 04/01/2020   Hypercholesterolemia 04/01/2020   Insomnia 04/01/2020   Ovarian mass 04/01/2020   Early stage nonexudative age-related macular degeneration of both eyes 02/26/2020   Posterior vitreous detachment of both eyes 02/26/2020   Abnormal CT of the chest 09/01/2017   Adult failure to thrive 09/01/2017   Neurocognitive disorder 08/30/2017   NSTEMI (non-ST elevated myocardial infarction) ([place]) 08/25/2017   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 Secondary to either irritation by dentures, vs. secondary to lung neoplasm 12/23/2012   Rheumatoid arthritis-severe 12/23/2012   COPD, mild ([place]) 12/23/2012   Osteoporosis 12/23/2012   Primary cancer of right upper lobe of lung ([place]) 03/21/2012   Home Medication(s) Prior to Admission medications   Medication Sig Start Date End Date Taking? Authorizing Provider  acetaminophen (TYLENOL) 325 MG tablet Take 650 mg by mouth every 6 (six) hours as needed for mild pain.   Yes [provider]  albuterol (PROVENTIL) (2.5 MG/3ML) 0.083% nebulizer solution Take 2.5  mg by nebulization every 4 (four) hours as needed for wheezing or shortness of breath.   Yes [provider]  carvedilol (COREG) 3.125 MG tablet Take 1 tablet (3.125 mg total) by mouth 2 (two) times daily with a meal. 10/14/17  Yes Medina-Vargas,  Monina C, NP  escitalopram (LEXAPRO) 10 MG tablet Take 10 mg by mouth daily. 10/13/21  Yes [provider]  famotidine (PEPCID) 20 MG tablet Take 1 tablet (20 mg total) by mouth daily. 08/04/21 08/04/22 Yes Ghimire, Henreitta Leber, MD  furosemide (LASIX) 40 MG tablet Take 40 mg by mouth daily.   Yes [provider]  LORazepam (ATIVAN) 0.5 MG tablet Take 0.5 mg by mouth every 4 (four) hours as needed for anxiety.   Yes [provider]  predniSONE (DELTASONE) 10 MG tablet Take 1 tablet (10 mg total) by mouth daily with breakfast. 06/25/21  Yes Deveshwar, Abel Presto, MD  QUEtiapine (SEROQUEL) 25 MG tablet Take 25 mg by mouth at bedtime.   Yes [provider]  traZODone (DESYREL) 100 MG tablet Take 100 mg by mouth at bedtime.   Yes [provider]  Zinc Oxide 13 % CREA Apply 1 Application topically as needed (irritation).   Yes [provider]  ALPRAZolam (XANAX) 0.25 MG tablet Take 1 tablet (0.25 mg total) by mouth 2 (two) times daily as needed for anxiety. Patient not taking: Reported on 12/11/2021 08/04/21   Jonetta Osgood, MD  diclofenac Sodium (VOLTAREN) 1 % GEL Apply 2 g topically 4 (four) times daily. Patient not taking: Reported on 12/11/2021 03/14/21   Long, Wonda Olds, MD                                                                                                                                    Past Surgical History Past Surgical History:  Procedure Laterality Date   LEFT HEART CATH AND CORONARY ANGIOGRAPHY N/A 08/26/2017   Procedure: LEFT HEART CATH AND CORONARY ANGIOGRAPHY;  Surgeon: Martinique, Peter M, MD;  Location: [facility];  Service: Cardiovascular;  Laterality: N/A;   right lobectomy     Family History Family History  Problem Relation Age of Onset   Cancer Daughter    Cancer Mother     Social History Social History   Tobacco Use   Smoking status: Former    Types: Cigarettes    Quit date: 1995    Years since quitting:  28.6   Smokeless tobacco: Never  Vaping Use   Vaping Use: Never used  Substance Use Topics   Alcohol use: No   Drug use: Never   Allergies Latex, Mouthwashes, Darvocet [propoxyphene n-acetaminophen], Erythromycin, Hydrocodone-acetaminophen, Ibandronic acid, Morphine and related, Oxycodone hcl, Oxycontin [oxycodone hcl], Sulfa antibiotics, and Tramadol  Review of Systems Review of Systems  Unable to perform ROS: Dementia    Physical Exam Vital Signs  I have reviewed the triage vital signs BP (!) 177/66  Pulse 72   Temp 97.8 F (36.6 C) (Axillary)   Resp (!) 27   SpO2 97%  Physical Exam Vitals and nursing note reviewed.  Constitutional:      General: She is in acute distress.     Appearance: She is well-developed.  HENT:     Head: Normocephalic and atraumatic.     Mouth/Throat:     Mouth: Mucous membranes are moist.  Eyes:     Pupils: Pupils are equal, round, and reactive to light.  Cardiovascular:     Rate and Rhythm: Normal rate and regular rhythm.     Heart sounds: No murmur heard. Pulmonary:     Effort: Respiratory distress present.     Breath sounds: Wheezing (diffusely) present.     Comments: Moderate increased work of breathing with tachypnea Abdominal:     General: Abdomen is flat.     Palpations: Abdomen is soft.     Tenderness: There is no abdominal tenderness.  Musculoskeletal:        General: No tenderness.     Right lower leg: Edema present.     Left lower leg: Edema present.  Skin:    General: Skin is warm and dry.  Neurological:     General: No focal deficit present.     Mental Status: She is alert.     Comments: Follows commands in all 4 extremities, no facial droop, oriented to self only  Psychiatric:        Mood and Affect: Mood normal.        Behavior: Behavior normal.     ED Results and Treatments Labs (all labs ordered are listed, but only abnormal results are displayed) Labs Reviewed  BLOOD GAS, VENOUS - Abnormal; Notable for the  following components:      Result Value   pH, Ven 7.44 (*)    Bicarbonate 38.5 (*)    Acid-Base Excess 12.5 (*)    All other components within normal limits  CBC WITH DIFFERENTIAL/PLATELET - Abnormal; Notable for the following components:   WBC 12.2 (*)    RBC 2.84 (*)    Hemoglobin 9.3 (*)    HCT 30.5 (*)    MCV 107.4 (*)    Neutro Abs 10.1 (*)    All other components within normal limits  COMPREHENSIVE METABOLIC PANEL - Abnormal; Notable for the following components:   Glucose, Bld 113 (*)    BUN 34 (*)    Creatinine, Ser 1.09 (*)    Albumin 3.2 (*)    GFR, Estimated 49 (*)    All other components within normal limits  BRAIN NATRIURETIC PEPTIDE - Abnormal; Notable for the following components:   B Natriuretic Peptide 1,699.1 (*)    All other components within normal limits  TROPONIN I (HIGH SENSITIVITY) - Abnormal; Notable for the following components:   Troponin I (High Sensitivity) 57 (*)    All other components within normal limits  RESP PANEL BY RT-PCR (FLU A&B, COVID) ARPGX2  PROTIME-INR  URINALYSIS, ROUTINE W REFLEX MICROSCOPIC  COMPREHENSIVE METABOLIC PANEL  CBC  TROPONIN I (HIGH SENSITIVITY)  Radiology DG Chest Port 1 View  Result Date: 12/11/2021 CLINICAL DATA:  Chest pain and cough EXAM: PORTABLE CHEST 1 VIEW COMPARISON:  08/04/2021 FINDINGS: Cardiac shadow is stable. Aortic calcifications are noted. Lungs are hyperinflated bilaterally. Stable scarring is noted in the right upper lung consistent with posttherapy change. Postsurgical changes are noted in the right apex as well. Patient rotation to the right is noted with accentuation of the mediastinal markings particularly in the right hilum. Some prominence is noted related to prior scarring and stable from the prior exam. No new focal infiltrate is seen. No bony abnormality is noted. IMPRESSION:  Chronic changes as described without acute abnormality. Electronically Signed   By: Inez Catalina M.D.   On: 12/11/2021 20:28    Pertinent labs & imaging results that were available during my care of the patient were reviewed by me and considered in my medical decision making (see MDM for details).  Medications Ordered in ED Medications  doxycycline (VIBRAMYCIN) 100 mg in sodium chloride 0.9 % 250 mL IVPB (100 mg Intravenous New Bag/Given 12/11/21 2254)  furosemide (LASIX) injection 40 mg (has no administration in time range)  predniSONE (DELTASONE) tablet 40 mg (has no administration in time range)  ipratropium (ATROVENT) nebulizer solution 0.5 mg (0.5 mg Nebulization Given 12/11/21 2250)  albuterol (PROVENTIL) (2.5 MG/3ML) 0.083% nebulizer solution 2.5 mg (has no administration in time range)  enoxaparin (LOVENOX) injection 30 mg (has no administration in time range)  sodium chloride flush (NS) 0.9 % injection 3 mL (has no administration in time range)  acetaminophen (TYLENOL) tablet 650 mg (has no administration in time range)    Or  acetaminophen (TYLENOL) suppository 650 mg (has no administration in time range)  polyethylene glycol (MIRALAX / GLYCOLAX) packet 17 g (has no administration in time range)  carvedilol (COREG) tablet 3.125 mg (has no administration in time range)  famotidine (PEPCID) tablet 20 mg (has no administration in time range)  escitalopram (LEXAPRO) tablet 10 mg (has no administration in time range)  LORazepam (ATIVAN) tablet 0.5 mg (has no administration in time range)  QUEtiapine (SEROQUEL) tablet 25 mg (25 mg Oral Given 12/11/21 2255)  traZODone (DESYREL) tablet 100 mg (100 mg Oral Given 12/11/21 2254)  methylPREDNISolone sodium succinate (SOLU-MEDROL) 125 mg/2 mL injection 125 mg (125 mg Intravenous Given 12/11/21 2054)  ipratropium-albuterol (DUONEB) 0.5-2.5 (3) MG/3ML nebulizer solution 3 mL (3 mLs Nebulization Given 12/11/21 2051)  cefTRIAXone (ROCEPHIN) 1 g in sodium  chloride 0.9 % 100 mL IVPB (0 g Intravenous Stopped 12/11/21 2231)  ipratropium-albuterol (DUONEB) 0.5-2.5 (3) MG/3ML nebulizer solution 3 mL (3 mLs Nebulization Given 12/11/21 2159)  furosemide (LASIX) injection 40 mg (40 mg Intravenous Given 12/11/21 2254)                                                                                                                                     Procedures Procedures  (including critical care  time)  Medical Decision Making / ED Course   MDM:  88 year old female presenting to the emergency department with shortness of breath.  History limited due to dementia, but patient hypoxic on arrival.  Hypoxia improved with supplemental oxygen.  Patient with diffuse wheezing on arrival which improved with DuoNeb and steroids, chest x-ray without evidence of pneumonia or pneumothorax on my read which I agree with radiologist interpretation.  Treated with antibiotics given suspected COPD exacerbation.  VBG with PCO2 elevated although not severely so.  Doubt CO2 encephalopathy.  Patient also has some lower extremity swelling, elevated BNP which is suspicious for CHF exacerbation, will give IV diuretics.  Urinalysis sent, pending at time of signout medicine.  Clinical Course as of 12/11/21 2311  Fri Dec 11, 2021  2202 Discussed with the hospitalist, who admit the patient for COPD and CHF exacerbation.  Urinalysis pending.  Labs notable for elevated BNP, elevated troponin.  EKG without sign of acute STEMI.  Patient also with mild elevated WBC count. [WS]    Clinical Course User Index [WS] Cristie Hem, MD     Additional history obtained: -Additional history obtained from daughter -External records from outside source obtained and reviewed including: Chart review including previous notes, labs, imaging, consultation notes   Lab Tests: -I ordered, reviewed, and interpreted labs.   The pertinent results include:   Labs Reviewed  BLOOD GAS, VENOUS -  Abnormal; Notable for the following components:      Result Value   pH, Ven 7.44 (*)    Bicarbonate 38.5 (*)    Acid-Base Excess 12.5 (*)    All other components within normal limits  CBC WITH DIFFERENTIAL/PLATELET - Abnormal; Notable for the following components:   WBC 12.2 (*)    RBC 2.84 (*)    Hemoglobin 9.3 (*)    HCT 30.5 (*)    MCV 107.4 (*)    Neutro Abs 10.1 (*)    All other components within normal limits  COMPREHENSIVE METABOLIC PANEL - Abnormal; Notable for the following components:   Glucose, Bld 113 (*)    BUN 34 (*)    Creatinine, Ser 1.09 (*)    Albumin 3.2 (*)    GFR, Estimated 49 (*)    All other components within normal limits  BRAIN NATRIURETIC PEPTIDE - Abnormal; Notable for the following components:   B Natriuretic Peptide 1,699.1 (*)    All other components within normal limits  TROPONIN I (HIGH SENSITIVITY) - Abnormal; Notable for the following components:   Troponin I (High Sensitivity) 57 (*)    All other components within normal limits  RESP PANEL BY RT-PCR (FLU A&B, COVID) ARPGX2  PROTIME-INR  URINALYSIS, ROUTINE W REFLEX MICROSCOPIC  COMPREHENSIVE METABOLIC PANEL  CBC  TROPONIN I (HIGH SENSITIVITY)      EKG   EKG Interpretation  Date/Time:  Friday December 11 2021 19:42:47 EDT Ventricular Rate:  78 PR Interval:  158 QRS Duration: 71 QT Interval:  411 QTC Calculation: 450 R Axis:   -57 Text Interpretation: Ectopic atrial rhythm Multiple premature complexes, vent & supraven Left anterior fascicular block Confirmed by Garnette Gunner [id number]) on 12/11/2021 9:06:56 PM         Imaging Studies ordered: I ordered imaging studies including CXR I independently visualized and interpreted imaging. I agree with the radiologist interpretation   Medicines ordered and prescription drug management: Meds ordered this encounter  Medications   methylPREDNISolone sodium succinate (SOLU-MEDROL) 125 mg/2 mL injection 125 mg    IV  methylprednisolone  will be converted to either a q12h or q24h frequency with the same total daily dose (TDD).  Ordered Dose: 1 to 125 mg TDD; convert to: TDD q24h.  Ordered Dose: 126 to 250 mg TDD; convert to: TDD div q12h.  Ordered Dose: >250 mg TDD; DAW.   ipratropium-albuterol (DUONEB) 0.5-2.5 (3) MG/3ML nebulizer solution 3 mL   cefTRIAXone (ROCEPHIN) 1 g in sodium chloride 0.9 % 100 mL IVPB    Order Specific Question:   Antibiotic Indication:    Answer:   CAP   doxycycline (VIBRAMYCIN) 100 mg in sodium chloride 0.9 % 250 mL IVPB    Order Specific Question:   Antibiotic Indication:    Answer:   CAP   ipratropium-albuterol (DUONEB) 0.5-2.5 (3) MG/3ML nebulizer solution 3 mL   furosemide (LASIX) injection 40 mg   furosemide (LASIX) injection 40 mg   predniSONE (DELTASONE) tablet 40 mg   ipratropium (ATROVENT) nebulizer solution 0.5 mg   albuterol (PROVENTIL) (2.5 MG/3ML) 0.083% nebulizer solution 2.5 mg   enoxaparin (LOVENOX) injection 30 mg   sodium chloride flush (NS) 0.9 % injection 3 mL   OR Linked Order Group    acetaminophen (TYLENOL) tablet 650 mg    acetaminophen (TYLENOL) suppository 650 mg   polyethylene glycol (MIRALAX / GLYCOLAX) packet 17 g   carvedilol (COREG) tablet 3.125 mg   famotidine (PEPCID) tablet 20 mg   escitalopram (LEXAPRO) tablet 10 mg   LORazepam (ATIVAN) tablet 0.5 mg   QUEtiapine (SEROQUEL) tablet 25 mg   traZODone (DESYREL) tablet 100 mg    -I have reviewed the patients home medicines and have made adjustments as needed    Consultations Obtained: I requested consultation with the hospitalist,  and discussed lab and imaging findings as well as pertinent plan - they recommend: admission   Cardiac Monitoring: The patient was maintained on a cardiac monitor.  I personally viewed and interpreted the cardiac monitored which showed an underlying rhythm of: NSR  Social Determinants of Health:  Factors impacting patients care include: dementia, smoking  hx   Reevaluation: After the interventions noted above, I reevaluated the patient and found that they have :improved  Co morbidities that complicate the patient evaluation  Past Medical History:  Diagnosis Date   AKI (acute kidney injury) ([place]) 07/25/2021   Cancer ([place])    lung ca   COPD (chronic obstructive pulmonary disease) ([place])    COVID-19 virus infection 05/24/4268   Hernia, umbilical    Hip pain, right    History of radiation therapy 05/02/12-05/04/12,&05/09/12   rul lung 54Gy/27fx   HLD (hyperlipidemia)    Hypotension 07/25/2021   Lung mass    Malignant neoplasm of lung ([place]) 04/01/2020   Myocardial infarction ([place]) 08/2017   Osteoporosis    PAC (premature atrial contraction)    RA (rheumatoid arthritis) ([place])    Shingles       Dispostion: Admit     Final Clinical Impression(s) / ED Diagnoses Final diagnoses:  COPD exacerbation ([place])  Hypoxia  Acute congestive heart failure, unspecified heart failure type ([place])     This chart was dictated using voice recognition software.  Despite best efforts to proofread,  errors can occur which can change the documentation meaning.    Cristie Hem, MD 12/11/21 [phone]

## 2021-12-11 NOTE — H&P (Addendum)
History and Physical   [name] PZW:[id number] DOB: 11-Jul-1929 DOA: 12/11/2021  PCP: Mayra Neer, MD   Patient coming from: [facility]  Chief Complaint: Shortness of breath  HPI: [name] is a 88 y.o. female with medical history significant of pleural cytopenia, anemia, depression, lung cancer, rheumatoid arthritis, COPD, CAD, diastolic CHF, macular degeneration, neurocognitive disorder, hyperlipidemia presenting with worsening shortness of breath.  Due to patient's chronic neurocognitive disorder history obtained with assistance of chart review and family.  Patient has had ongoing cough at facility and symptoms worsened today and include shortness of breath.  Also her noted to have lower extremity edema.  Due to worsening symptoms staff contacted EMS for patient to be transported to the hospital.  Due to patient's neurocognitive disorder difficulty getting full review of systems though she denies any specific complaints at this time  ED Course: Vital signs in the ED significant for blood pressure in the 423N to 361W systolic, respiratory rate in the 20s, saturating in the mid 80s on room air requiring 4 L to maintain saturations.  Lab work-up included CMP with BUN of 34, creatinine stable 1.09, glucose 113, albumin 3.2.  CBC significant for leukocytosis to 12.2, hemoglobin stable at 9.3.  PT and INR pending.  Troponin mildly elevated at 57 with repeat pending.  BNP elevated at 1694.  Respiratory panel for flu COVID-negative.  Urinalysis pending.  VBG with pH 7.04 and normal PCO2.  Chest x-ray showing chronic changes with no acute abnormality.  Patient received ceftriaxone and doxycycline in the ED and also received Solu-Medrol and a DuoNeb as well as a dose of Lasix in the ED.  Admission requested for acute epoxy respiratory failure with concern for CHF exacerbation and possible COPD exacerbation as well.  Review of Systems: Due to patient's neurocognitive disorder difficulty  getting full review of systems though she denies any specific complaints at this time  Past Medical History:  Diagnosis Date   AKI (acute kidney injury) ([place]) 07/25/2021   Cancer ([place])    lung ca   COPD (chronic obstructive pulmonary disease) ([place])    COVID-19 virus infection 07/21/1538   Hernia, umbilical    Hip pain, right    History of radiation therapy 05/02/12-05/04/12,&05/09/12   rul lung 54Gy/63fx   HLD (hyperlipidemia)    Hypotension 07/25/2021   Lung mass    Malignant neoplasm of lung ([place]) 04/01/2020   Myocardial infarction ([place]) 08/2017   Osteoporosis    PAC (premature atrial contraction)    RA (rheumatoid arthritis) ([place])    Shingles     Past Surgical History:  Procedure Laterality Date   LEFT HEART CATH AND CORONARY ANGIOGRAPHY N/A 08/26/2017   Procedure: LEFT HEART CATH AND CORONARY ANGIOGRAPHY;  Surgeon: Martinique, Peter M, MD;  Location: [facility];  Service: Cardiovascular;  Laterality: N/A;   right lobectomy      Social History  reports that she quit smoking about 28 years ago. Her smoking use included cigarettes. She has never used smokeless tobacco. She reports that she does not drink alcohol and does not use drugs.  Allergies  Allergen Reactions   Latex Other (See Comments)    Blisters on her mouth   Mouthwashes Shortness Of Breath and Other (See Comments)    MAGIC MOUTHWASH   Darvocet [Propoxyphene N-Acetaminophen] Nausea And Vomiting   Erythromycin Nausea And Vomiting   Hydrocodone-Acetaminophen Other (See Comments)    Dizziness    Ibandronic Acid Other (See Comments)    Muscle aches  Morphine And Related Nausea And Vomiting   Oxycodone Hcl Nausea And Vomiting   Oxycontin [Oxycodone Hcl] Nausea And Vomiting   Sulfa Antibiotics Nausea And Vomiting   Tramadol Nausea And Vomiting    Family History  Problem Relation Age of Onset   Cancer Daughter    Cancer Mother   Reviewed on admission  Prior to Admission medications   Medication Sig Start  Date End Date Taking? Authorizing Provider  carvedilol (COREG) 3.125 MG tablet Take 1 tablet (3.125 mg total) by mouth 2 (two) times daily with a meal. 10/14/17  Yes Medina-Vargas, Monina C, NP  escitalopram (LEXAPRO) 10 MG tablet Take 10 mg by mouth daily. 10/13/21  Yes [provider]  famotidine (PEPCID) 20 MG tablet Take 1 tablet (20 mg total) by mouth daily. 08/04/21 08/04/22 Yes Ghimire, Henreitta Leber, MD  furosemide (LASIX) 40 MG tablet Take 40 mg by mouth daily.   Yes [provider]  predniSONE (DELTASONE) 10 MG tablet Take 1 tablet (10 mg total) by mouth daily with breakfast. 06/25/21  Yes Deveshwar, Abel Presto, MD  QUEtiapine (SEROQUEL) 25 MG tablet Take 25 mg by mouth at bedtime.   Yes [provider]  traZODone (DESYREL) 100 MG tablet Take 100 mg by mouth at bedtime.   Yes [provider]  ALPRAZolam (XANAX) 0.25 MG tablet Take 1 tablet (0.25 mg total) by mouth 2 (two) times daily as needed for anxiety. 08/04/21   Ghimire, Henreitta Leber, MD  dextromethorphan-guaiFENesin (MUCINEX DM) 30-600 MG 12hr tablet Take 1 tablet by mouth 2 (two) times daily as needed for cough. 08/04/21   Ghimire, Henreitta Leber, MD  diclofenac Sodium (VOLTAREN) 1 % GEL Apply 2 g topically 4 (four) times daily. 03/14/21   Long, Wonda Olds, MD  Ipratropium-Albuterol (COMBIVENT) 20-100 MCG/ACT AERS respimat Inhale 1 puff into the lungs 2 (two) times daily. 08/04/21   Ghimire, Henreitta Leber, MD  mupirocin ointment (BACTROBAN) 2 % Apply 1 application topically daily. 04/21/21   McDonald, Stephan Minister, DPM  ondansetron (ZOFRAN) 4 MG tablet Take 1 tablet (4 mg total) by mouth every 6 (six) hours. 03/14/21   Margette Fast, MD    Physical Exam: Vitals:   12/11/21 2015 12/11/21 2103 12/11/21 2115 12/11/21 2200  BP: (!) 164/69 (!) 188/58 (!) 181/63 (!) 177/66  Pulse: 70 68 66 72  Resp: (!) 24 17 (!) 21 (!) 27  Temp:      TempSrc:      SpO2: 94% 97% 99% 97%    Physical Exam Constitutional:      General: She is not  in acute distress.    Appearance: Normal appearance.  HENT:     Head: Normocephalic and atraumatic.     Mouth/Throat:     Mouth: Mucous membranes are moist.     Pharynx: Oropharynx is clear.  Eyes:     Extraocular Movements: Extraocular movements intact.     Pupils: Pupils are equal, round, and reactive to light.  Cardiovascular:     Rate and Rhythm: Normal rate and regular rhythm.     Pulses: Normal pulses.     Heart sounds: Normal heart sounds.  Pulmonary:     Effort: Pulmonary effort is normal. No respiratory distress.     Breath sounds: Wheezing present.  Abdominal:     General: Bowel sounds are normal. There is no distension.     Palpations: Abdomen is soft.     Tenderness: There is no abdominal tenderness.  Musculoskeletal:  General: No swelling or deformity.     Right lower leg: Edema present.     Left lower leg: Edema present.     Comments: Chronic bilateral lower extremity wounds.  Skin:    General: Skin is warm and dry.  Neurological:     General: No focal deficit present.     Mental Status: Mental status is at baseline.    Labs on Admission: I have personally reviewed following labs and imaging studies  CBC: Recent Labs  Lab 12/11/21 2050  WBC 12.2*  NEUTROABS 10.1*  HGB 9.3*  HCT 30.5*  MCV 107.4*  PLT 865    Basic Metabolic Panel: Recent Labs  Lab 12/11/21 2050  NA 144  K 4.5  CL 103  CO2 32  GLUCOSE 113*  BUN 34*  CREATININE 1.09*  CALCIUM 8.9    GFR: CrCl cannot be calculated (Unknown ideal weight.).  Liver Function Tests: Recent Labs  Lab 12/11/21 2050  AST 18  ALT 14  ALKPHOS 44  BILITOT 0.5  PROT 6.5  ALBUMIN 3.2*    Urine analysis:    Component Value Date/Time   COLORURINE AMBER (A) 08/04/2021 2024   APPEARANCEUR CLOUDY (A) 08/04/2021 2024   LABSPEC 1.017 08/04/2021 2024   PHURINE 8.0 08/04/2021 2024   GLUCOSEU NEGATIVE 08/04/2021 2024   HGBUR SMALL (A) 08/04/2021 2024   BILIRUBINUR NEGATIVE 08/04/2021 2024    KETONESUR 5 (A) 08/04/2021 2024   PROTEINUR 30 (A) 08/04/2021 2024   UROBILINOGEN 0.2 12/23/2012 1448   NITRITE NEGATIVE 08/04/2021 2024   LEUKOCYTESUR MODERATE (A) 08/04/2021 2024    Radiological Exams on Admission: DG Chest Port 1 View  Result Date: 12/11/2021 CLINICAL DATA:  Chest pain and cough EXAM: PORTABLE CHEST 1 VIEW COMPARISON:  08/04/2021 FINDINGS: Cardiac shadow is stable. Aortic calcifications are noted. Lungs are hyperinflated bilaterally. Stable scarring is noted in the right upper lung consistent with posttherapy change. Postsurgical changes are noted in the right apex as well. Patient rotation to the right is noted with accentuation of the mediastinal markings particularly in the right hilum. Some prominence is noted related to prior scarring and stable from the prior exam. No new focal infiltrate is seen. No bony abnormality is noted. IMPRESSION: Chronic changes as described without acute abnormality. Electronically Signed   By: Inez Catalina M.D.   On: 12/11/2021 20:28    EKG: Independently reviewed.  Sinus versus ectopic rhythm at 70 bpm.  Multiple PACs.  Some baseline artifact and baseline wander as well.  Assessment/Plan Principal Problem:   Acute respiratory failure with hypoxia (HCC) Active Problems:   Anemia of chronic disease   Depression, recurrent (HCC)   Rheumatoid arthritis-severe   COPD, mild (HCC)   Coronary artery calcification   History of lung cancer   Neurocognitive disorder   Hypercholesterolemia   COPD with acute exacerbation (HCC)   Acute on chronic diastolic CHF (congestive heart failure) ([place])   Acute respiratory failure with hypoxia CHF exacerbation COPD exacerbation > Patient presenting with worsening cough and new onset shortness of breath today.  She was at her facility and due to her worsening shortness of breath staff called EMS. > On arrival to the ED patient noted to be hypoxic in the mid 80s on room air and placed on 4 L to maintain  saturations. > Has worsening lower extremity edema and elevated BNP to 1600 consistent with CHF laceration.  Also presenting with wheezes responding to inhalers and steroids in the ED consistent with COPD exacerbation.  Likely multifactorial. - Monitor on telemetry - Continue with supplemental oxygen, wean as tolerated For CHF exacerbation - Continue with Lasix 40 mg IV twice daily - Strict I's and O's, daily weights - Echocardiogram For COPD exacerbation - Schedule Atrovent to - Albuterol - Daily prednisone  CAD - Continue home carvedilol  Depression Neurocognitive disorder / dementia - Continue home Lexapro and as needed Ativan - Continue home Seroquel and trazodone  GERD - Continue home Pepcid  Rheumatoid arthritis > Significant rheumatoid arthritis on chronic steroids. - Will be on increased his steroids with concern for COPD exacerbation as above.  Anemia > Hemoglobin stable at 9.3 - Trend CBC  History of lung cancer - Noted  DVT prophylaxis: Lovenox Code Status:   DNR Family Communication:  Daughter updated by phone Disposition Plan:   Patient is from:  Facility, [facility]  Anticipated DC to:  Same as above  Anticipated DC date:  1 to 3 days  Anticipated DC barriers: None  Consults called:  None Admission status:  Observation, telemetry  Severity of Illness: The appropriate patient status for this patient is OBSERVATION. Observation status is judged to be reasonable and necessary in order to provide the required intensity of service to ensure the patient's safety. The patient's presenting symptoms, physical exam findings, and initial radiographic and laboratory data in the context of their medical condition is felt to place them at decreased risk for further clinical deterioration. Furthermore, it is anticipated that the patient will be medically stable for discharge from the hospital within 2 midnights of admission.    Marcelyn Bruins MD [facility]  How to contact the [facility] Attending or Consulting provider [place] or covering provider during after hours [place], for this patient?   Check the care team in [facility] and look for a) attending/consulting TR[facility] provider listed and b) the [facility] team listed Log into [email or web address] and use [place]'s universal password to access. If you do not have the password, please contact the hospital operator. Locate the [facility] provider you are looking for under [facility] and page to a number that you can be directly reached. If you still have difficulty reaching the provider, please page the [facility] (Director on Call) for the Hospitalists listed on amion for assistance.  12/11/2021, 10:06 PM

## 2021-12-11 NOTE — ED Triage Notes (Signed)
Pt hx of COPD

## 2021-12-11 NOTE — ED Notes (Signed)
Pt sats low to mid 80s placed on 4L [place].

## 2021-12-12 ENCOUNTER — Inpatient Hospital Stay (HOSPITAL_COMMUNITY): Payer: Medicare Other

## 2021-12-12 ENCOUNTER — Other Ambulatory Visit: Payer: Self-pay

## 2021-12-12 DIAGNOSIS — J9621 Acute and chronic respiratory failure with hypoxia: Secondary | ICD-10-CM | POA: Diagnosis present

## 2021-12-12 DIAGNOSIS — Z515 Encounter for palliative care: Secondary | ICD-10-CM | POA: Diagnosis not present

## 2021-12-12 DIAGNOSIS — L89302 Pressure ulcer of unspecified buttock, stage 2: Secondary | ICD-10-CM | POA: Diagnosis present

## 2021-12-12 DIAGNOSIS — Z85118 Personal history of other malignant neoplasm of bronchus and lung: Secondary | ICD-10-CM | POA: Diagnosis not present

## 2021-12-12 DIAGNOSIS — M810 Age-related osteoporosis without current pathological fracture: Secondary | ICD-10-CM | POA: Diagnosis present

## 2021-12-12 DIAGNOSIS — F0393 Unspecified dementia, unspecified severity, with mood disturbance: Secondary | ICD-10-CM | POA: Diagnosis present

## 2021-12-12 DIAGNOSIS — Z91040 Latex allergy status: Secondary | ICD-10-CM | POA: Diagnosis not present

## 2021-12-12 DIAGNOSIS — R0902 Hypoxemia: Secondary | ICD-10-CM | POA: Diagnosis present

## 2021-12-12 DIAGNOSIS — Z885 Allergy status to narcotic agent status: Secondary | ICD-10-CM | POA: Diagnosis not present

## 2021-12-12 DIAGNOSIS — E7800 Pure hypercholesterolemia, unspecified: Secondary | ICD-10-CM | POA: Diagnosis present

## 2021-12-12 DIAGNOSIS — I2510 Atherosclerotic heart disease of native coronary artery without angina pectoris: Secondary | ICD-10-CM | POA: Diagnosis present

## 2021-12-12 DIAGNOSIS — Z8616 Personal history of COVID-19: Secondary | ICD-10-CM | POA: Diagnosis not present

## 2021-12-12 DIAGNOSIS — R0609 Other forms of dyspnea: Secondary | ICD-10-CM

## 2021-12-12 DIAGNOSIS — J9601 Acute respiratory failure with hypoxia: Secondary | ICD-10-CM | POA: Diagnosis not present

## 2021-12-12 DIAGNOSIS — Z66 Do not resuscitate: Secondary | ICD-10-CM | POA: Diagnosis present

## 2021-12-12 DIAGNOSIS — I110 Hypertensive heart disease with heart failure: Secondary | ICD-10-CM | POA: Diagnosis present

## 2021-12-12 DIAGNOSIS — Z20822 Contact with and (suspected) exposure to covid-19: Secondary | ICD-10-CM | POA: Diagnosis present

## 2021-12-12 DIAGNOSIS — I248 Other forms of acute ischemic heart disease: Secondary | ICD-10-CM | POA: Diagnosis present

## 2021-12-12 DIAGNOSIS — J441 Chronic obstructive pulmonary disease with (acute) exacerbation: Secondary | ICD-10-CM | POA: Diagnosis present

## 2021-12-12 DIAGNOSIS — Z923 Personal history of irradiation: Secondary | ICD-10-CM | POA: Diagnosis not present

## 2021-12-12 DIAGNOSIS — M069 Rheumatoid arthritis, unspecified: Secondary | ICD-10-CM | POA: Diagnosis present

## 2021-12-12 DIAGNOSIS — I252 Old myocardial infarction: Secondary | ICD-10-CM | POA: Diagnosis not present

## 2021-12-12 DIAGNOSIS — K219 Gastro-esophageal reflux disease without esophagitis: Secondary | ICD-10-CM | POA: Diagnosis present

## 2021-12-12 DIAGNOSIS — D638 Anemia in other chronic diseases classified elsewhere: Secondary | ICD-10-CM | POA: Diagnosis present

## 2021-12-12 DIAGNOSIS — Z881 Allergy status to other antibiotic agents status: Secondary | ICD-10-CM | POA: Diagnosis not present

## 2021-12-12 DIAGNOSIS — Z9109 Other allergy status, other than to drugs and biological substances: Secondary | ICD-10-CM | POA: Diagnosis not present

## 2021-12-12 DIAGNOSIS — I5033 Acute on chronic diastolic (congestive) heart failure: Secondary | ICD-10-CM | POA: Diagnosis present

## 2021-12-12 LAB — COMPREHENSIVE METABOLIC PANEL
ALT: 15 U/L (ref 0–44)
AST: 16 U/L (ref 15–41)
Albumin: 3.2 g/dL — ABNORMAL LOW (ref 3.5–5.0)
Alkaline Phosphatase: 42 U/L (ref 38–126)
Anion gap: 11 (ref 5–15)
BUN: 35 mg/dL — ABNORMAL HIGH (ref 8–23)
CO2: 31 mmol/L (ref 22–32)
Calcium: 8.8 mg/dL — ABNORMAL LOW (ref 8.9–10.3)
Chloride: 102 mmol/L (ref 98–111)
Creatinine, Ser: 1.15 mg/dL — ABNORMAL HIGH (ref 0.44–1.00)
GFR, Estimated: 46 mL/min — ABNORMAL LOW (ref >60)
Glucose, Bld: 155 mg/dL — ABNORMAL HIGH (ref 70–99)
Potassium: 3.8 mmol/L (ref 3.5–5.1)
Sodium: 144 mmol/L (ref 135–145)
Total Bilirubin: 0.8 mg/dL (ref 0.3–1.2)
Total Protein: 6.6 g/dL (ref 6.5–8.1)

## 2021-12-12 LAB — ECHOCARDIOGRAM COMPLETE
AR max vel: 1.2 cm2
AV Area VTI: 1.26 cm2
AV Area mean vel: 1.25 cm2
AV Mean grad: 9.7 mmHg
AV Peak grad: 21 mmHg
Ao pk vel: 2.29 m/s
Area-P 1/2: 4.26 cm2
P 1/2 time: 457 msec
S' Lateral: 2.6 cm
Weight: 1563.2 oz

## 2021-12-12 LAB — CBC
HCT: 31.1 % — ABNORMAL LOW (ref 36.0–46.0)
Hemoglobin: 9.4 g/dL — ABNORMAL LOW (ref 12.0–15.0)
MCH: 32.5 pg (ref 26.0–34.0)
MCHC: 30.2 g/dL (ref 30.0–36.0)
MCV: 107.6 fL — ABNORMAL HIGH (ref 80.0–100.0)
Platelets: 200 10*3/uL (ref 150–400)
RBC: 2.89 MIL/uL — ABNORMAL LOW (ref 3.87–5.11)
RDW: 14.2 % (ref 11.5–15.5)
WBC: 10.8 10*3/uL — ABNORMAL HIGH (ref 4.0–10.5)
nRBC: 0 % (ref 0.0–0.2)

## 2021-12-12 LAB — URINALYSIS, ROUTINE W REFLEX MICROSCOPIC
Bilirubin Urine: NEGATIVE
Glucose, UA: NEGATIVE mg/dL
Ketones, ur: NEGATIVE mg/dL
Leukocytes,Ua: NEGATIVE
Nitrite: NEGATIVE
Protein, ur: NEGATIVE mg/dL
Specific Gravity, Urine: 1.008 (ref 1.005–1.030)
pH: 5 (ref 5.0–8.0)

## 2021-12-12 LAB — TROPONIN I (HIGH SENSITIVITY): Troponin I (High Sensitivity): 55 ng/L — ABNORMAL HIGH (ref <18)

## 2021-12-12 MED ORDER — SODIUM CHLORIDE 0.9 % IV SOLN: INTRAVENOUS | Status: DC | PRN

## 2021-12-12 MED ORDER — SODIUM CHLORIDE 0.9 % IV SOLN
1.0000 g | INTRAVENOUS | Status: DC
  Administered 2021-12-13 (×2): 1 g via INTRAVENOUS
  Filled 2021-12-12 (×4): qty 10

## 2021-12-12 MED ORDER — SODIUM CHLORIDE 0.9 % IV SOLN
100.0000 mg | Freq: Two times a day (BID) | INTRAVENOUS | Status: DC
  Administered 2021-12-12: 100 mg via INTRAVENOUS
  Filled 2021-12-12: qty 100

## 2021-12-12 MED ORDER — GUAIFENESIN ER 600 MG PO TB12
600.0000 mg | ORAL_TABLET | Freq: Two times a day (BID) | ORAL | Status: DC
  Administered 2021-12-12 – 2021-12-14 (×4): 600 mg via ORAL
  Filled 2021-12-12 (×4): qty 1

## 2021-12-12 MED ORDER — IPRATROPIUM-ALBUTEROL 0.5-2.5 (3) MG/3ML IN SOLN
3.0000 mL | Freq: Four times a day (QID) | RESPIRATORY_TRACT | Status: DC
  Administered 2021-12-12: 3 mL via RESPIRATORY_TRACT
  Filled 2021-12-12: qty 3

## 2021-12-12 MED ORDER — IPRATROPIUM-ALBUTEROL 0.5-2.5 (3) MG/3ML IN SOLN
3.0000 mL | Freq: Two times a day (BID) | RESPIRATORY_TRACT | Status: DC
Start: 2021-12-12 — End: 2021-12-12

## 2021-12-12 MED ORDER — IPRATROPIUM-ALBUTEROL 0.5-2.5 (3) MG/3ML IN SOLN
3.0000 mL | Freq: Four times a day (QID) | RESPIRATORY_TRACT | Status: DC
Start: 2021-12-12 — End: 2021-12-14
  Administered 2021-12-12 – 2021-12-13 (×5): 3 mL via RESPIRATORY_TRACT
  Filled 2021-12-12 (×5): qty 3

## 2021-12-12 MED ORDER — DOXYCYCLINE HYCLATE 100 MG PO TABS
100.0000 mg | ORAL_TABLET | Freq: Two times a day (BID) | ORAL | Status: DC
  Administered 2021-12-12 – 2021-12-14 (×4): 100 mg via ORAL
  Filled 2021-12-12 (×4): qty 1

## 2021-12-12 NOTE — Progress Notes (Signed)
CT order placed. This RN spoke with pt and pt stated that she cannot lie flat. She asked if we can do it another time. MD updated.

## 2021-12-12 NOTE — Progress Notes (Addendum)
PROGRESS NOTE  [name]  YTK:[id number] DOB: June 05, 1929 DOA: 12/11/2021 PCP: Mayra Neer, MD   Brief Narrative:  Patient is a 88 year old female with history of anemia, depression, lung cancer, rheumatoid arthritis, COPD, diastolic CHF, coronary artery disease, neurocognitive disorder, hyperlipidemia who presented from [facility] with complaints of shortness of breath.  She was coughing and dyspneic at nursing facility.  Also noted to have lower extremity edema.  On presentation ,she was hypertensive, saturating in the mid 80s requiring 4 L of oxygen.  Lab work showed BNP of 1694.  COVID/flu panel negative.  Chest x-ray showed chronic changes without acute findings.  Patient was admitted for the management of acute hypoxic respiratory failure secondary to COPD and CHF exacerbation.  Assessment & Plan:  Principal Problem:   Acute respiratory failure with hypoxia (HCC) Active Problems:   Anemia of chronic disease   Depression, recurrent (HCC)   Rheumatoid arthritis-severe   COPD, mild (HCC)   Coronary artery calcification   History of lung cancer   Neurocognitive disorder   Hypercholesterolemia   COPD with acute exacerbation (HCC)   Acute on chronic diastolic CHF (congestive heart failure) (HCC)    Acute on chronic  hypoxic respiratory failure: Presented with cough, worsening shortness of breath. She uses 3 L at baseline. Likely from concurrent COPD/CHF exacerbation.  Elevated BNP, she was wheezing.  We will try to wean the oxygen. She was not in respiratory distress today, speaking in full sentences  Acute on chronic diastolic CHF exacerbation :started on Lasix 40 mg IV daily.  Monitor input/output, daily weight.  Echocardiogram has been ordered.  We will follow-up.  Continue Lasix at current dose today.  Acute exacerbation of COPD: She was wheezing on arrival.  Started on steroids.  Continue bronchodilators.  Incentive spirometry  History of coronary artery  disease/elevated troponin: No anginal symptoms.  Home carvedilol to be continued, mildly elevated troponin most likely from demand ischemia.  History of rheumatoid arthritis: Takes steroid.  Normocytic anemia: Currently hemoglobin stable at 9.3.  History of lung cancer: Currently on hospice services  Depression/neurocognitive disorder/dementia: On Lexapro, Seroquel, trazodone, as needed Ativan  GERD: On PPI  Goals of care/deconditioning/debility: Patient lives at nursing facility, currently enrolled in hospice.  Extremely elderly female.  Expect discharge back to nursing facility when stable and continue enrollment with hospice.  CODE STATUS is DNR.  Addendum: Patient required more oxygen this afternoon. Seen and examined at bed side. On 12 L high flow. Long discussion held with daughter at bedside We will do non-contrast CT.Resume abx. Palliative care consulted for more goals of care. Daughter understands her prognosis.Code status is DNR       DVT prophylaxis:enoxaparin (LOVENOX) injection 30 mg Start: 12/12/21 1000     Code Status: DNR  Family Communication: Called and discussed with daughter on phone on 8/26  Patient status:Inpatient  Patient is from :SnF  Anticipated discharge to:SNF  Estimated DC date:1-2 days   Consultants: None  Procedures:None  Antimicrobials:  Anti-infectives (From admission, onward)    Start     Dose/Rate Route Frequency Ordered Stop   12/11/21 2145  cefTRIAXone (ROCEPHIN) 1 g in sodium chloride 0.9 % 100 mL IVPB        1 g 200 mL/hr over 30 Minutes Intravenous  Once 12/11/21 2142 12/11/21 2231   12/11/21 2145  doxycycline (VIBRAMYCIN) 100 mg in sodium chloride 0.9 % 250 mL IVPB        100 mg 125 mL/hr over 120 Minutes Intravenous  Once 12/11/21 2142 12/12/21 0054       Subjective: Patient seen and examined at the bedside this morning.  Hemodynamically stable she was on 3 to 4 L of oxygen.  She was being done echo.  She was not in  respiratory distress.  She states she wanted to go home.  Confused but alert and awake and speaking in full sentences.   Objective: Vitals:   12/12/21 0224 12/12/21 0427 12/12/21 0500 12/12/21 0749  BP:  (!) 169/67    Pulse:  88    Resp:  (!) 21    Temp:  97.8 F (36.6 C)    TempSrc:  Oral    SpO2: 100% 96%  94%  Weight:   44.3 kg     Intake/Output Summary (Last 24 hours) at 12/12/2021 0805 Last data filed at 12/12/2021 0428 Gross per 24 hour  Intake 60 ml  Output --  Net 60 ml   Filed Weights   12/12/21 0500  Weight: 44.3 kg    Examination:  General exam: very deconditioned, elderly  HEENT: PERRL Respiratory system:  no wheezes or crackles ,diminished air sounds on bases Cardiovascular system: S1 & S2 heard, RRR.  Gastrointestinal system: Abdomen is nondistended, soft and nontender. Central nervous system: Alert and awake,not oriented Extremities: trace lower extremity edema, no clubbing ,no cyanosis Skin: superficial skin breakdown, ,no icterus     Data Reviewed: I have personally reviewed following labs and imaging studies  CBC: Recent Labs  Lab 12/11/21 2050 12/12/21 0541  WBC 12.2* 10.8*  NEUTROABS 10.1*  --   HGB 9.3* 9.4*  HCT 30.5* 31.1*  MCV 107.4* 107.6*  PLT 203 030   Basic Metabolic Panel: Recent Labs  Lab 12/11/21 2050 12/12/21 0541  NA 144 144  K 4.5 3.8  CL 103 102  CO2 32 31  GLUCOSE 113* 155*  BUN 34* 35*  CREATININE 1.09* 1.15*  CALCIUM 8.9 8.8*     Recent Results (from the past 240 hour(s))  Resp Panel by RT-PCR (Flu A&B, Covid) Anterior Nasal Swab     Status: None   Collection Time: 12/11/21  8:35 PM   Specimen: Anterior Nasal Swab  Result Value Ref Range Status   SARS Coronavirus 2 by RT PCR NEGATIVE NEGATIVE Final    Comment: (NOTE) SARS-CoV-2 target nucleic acids are NOT DETECTED.  The SARS-CoV-2 RNA is generally detectable in upper respiratory specimens during the acute phase of infection. The lowest concentration of  SARS-CoV-2 viral copies this assay can detect is 138 copies/mL. A negative result does not preclude SARS-Cov-2 infection and should not be used as the sole basis for treatment or other patient management decisions. A negative result may occur with  improper specimen collection/handling, submission of specimen other than nasopharyngeal swab, presence of viral mutation(s) within the areas targeted by this assay, and inadequate number of viral copies(<138 copies/mL). A negative result must be combined with clinical observations, patient history, and epidemiological information. The expected result is Negative.  Fact Sheet for Patients:  EntrepreneurPulse.com.au  Fact Sheet for Healthcare Providers:  IncredibleEmployment.be  This test is no t yet approved or cleared by the [place] FDA and  has been authorized for detection and/or diagnosis of SARS-CoV-2 by FDA under an Emergency Use Authorization (EUA). This EUA will remain  in effect (meaning this test can be used) for the duration of the COVID-19 declaration under Section 564(b)(1) of the Act, 21 U.S.C.section 360bbb-3(b)(1), unless the authorization is terminated  or revoked sooner.  Influenza A by PCR NEGATIVE NEGATIVE Final   Influenza B by PCR NEGATIVE NEGATIVE Final    Comment: (NOTE) The Xpert Xpress SARS-CoV-2/FLU/RSV plus assay is intended as an aid in the diagnosis of influenza from Nasopharyngeal swab specimens and should not be used as a sole basis for treatment. Nasal washings and aspirates are unacceptable for Xpert Xpress SARS-CoV-2/FLU/RSV testing.  Fact Sheet for Patients: EntrepreneurPulse.com.au  Fact Sheet for Healthcare Providers: IncredibleEmployment.be  This test is not yet approved or cleared by the [place] FDA and has been authorized for detection and/or diagnosis of SARS-CoV-2 by FDA under an Emergency Use  Authorization (EUA). This EUA will remain in effect (meaning this test can be used) for the duration of the COVID-19 declaration under Section 564(b)(1) of the Act, 21 U.S.C. section 360bbb-3(b)(1), unless the authorization is terminated or revoked.  Performed at [facility], [place] [address] [place], [place] [address]      Radiology Studies: [facility] Chest Port 1 View  Result Date: 12/11/2021 CLINICAL DATA:  Chest pain and cough EXAM: PORTABLE CHEST 1 VIEW COMPARISON:  08/04/2021 FINDINGS: Cardiac shadow is stable. Aortic calcifications are noted. Lungs are hyperinflated bilaterally. Stable scarring is noted in the right upper lung consistent with posttherapy change. Postsurgical changes are noted in the right apex as well. Patient rotation to the right is noted with accentuation of the mediastinal markings particularly in the right hilum. Some prominence is noted related to prior scarring and stable from the prior exam. No new focal infiltrate is seen. No bony abnormality is noted. IMPRESSION: Chronic changes as described without acute abnormality. Electronically Signed   By: Inez Catalina M.D.   On: 12/11/2021 20:28    Scheduled Meds:  carvedilol  3.125 mg Oral BID WC   enoxaparin (LOVENOX) injection  30 mg Subcutaneous Q24H   escitalopram  10 mg Oral Daily   famotidine  20 mg Oral Daily   furosemide  40 mg Intravenous BID   ipratropium-albuterol  3 mL Nebulization BID   predniSONE  40 mg Oral Q breakfast   QUEtiapine  25 mg Oral QHS   sodium chloride flush  3 mL Intravenous Q12H   traZODone  100 mg Oral QHS   Continuous Infusions:   LOS: 0 days   Shelly Coss, MD [facility] P8/26/2023, 8:05 AM

## 2021-12-12 NOTE — Progress Notes (Signed)
Called to PT room for PRN neb due to low Sp02. Neb given, BBS clear-diminished, PT not in respiratory distress at this time. Titrated from NRB (PT is a retainer), to a 10 LPM [place]. Currently, Sp02 goal is >=92%- RN agrees to ask MD about >=88%.

## 2021-12-12 NOTE — Social Work (Signed)
CSW spoke to Pt's daughter, the daughter wants the mom to go to rehab, however the Pt is hospice and is very weak. CSW explained to the daughter how rehab worked Social research officer, government, after the Pt's daughter stated that she was weak. CSW made provider aware and requested that the provider put in a order for Pt to see if that is a possible option for this Pt. CSW let the Pt's daughter know all other possibilities if the Pt does not qualify for the SNF placement, as well the process if the Pt is. TOC will continue to follow.

## 2021-12-12 NOTE — Progress Notes (Signed)
  CONCERNING: Antibiotic IV to Oral Route Change Policy  RECOMMENDATION: This patient is receiving Doxycycline by the intravenous route.  Based on criteria approved by the Pharmacy and Therapeutics Committee, the antibiotic(s) is/are being converted to the equivalent oral dose form(s).   DESCRIPTION: These criteria include: Patient being treated for a respiratory tract infection, urinary tract infection, cellulitis or clostridium difficile associated diarrhea if on metronidazole The patient is not neutropenic and does not exhibit a GI malabsorption state The patient is eating (either orally or via tube) and/or has been taking other orally administered medications for a least 24 hours The patient is improving clinically and has a Tmax < 100.5  If you have questions about this conversion, please contact the Pharmacy Department  []   [phone] )  [facility] []   [phone] )  [facility] []   [phone] )  [facility] []   [phone] )  [facility] [x]   [phone] )  [facility]

## 2021-12-12 NOTE — Progress Notes (Signed)
ABG attempted, after first stick pt refused further attempts

## 2021-12-12 NOTE — Progress Notes (Signed)
  Echocardiogram 2D Echocardiogram has been performed.  Darlina Sicilian M 12/12/2021, 10:23 AM

## 2021-12-13 DIAGNOSIS — J9601 Acute respiratory failure with hypoxia: Secondary | ICD-10-CM | POA: Diagnosis not present

## 2021-12-13 LAB — BASIC METABOLIC PANEL
Anion gap: 9 (ref 5–15)
BUN: 43 mg/dL — ABNORMAL HIGH (ref 8–23)
CO2: 35 mmol/L — ABNORMAL HIGH (ref 22–32)
Calcium: 8.4 mg/dL — ABNORMAL LOW (ref 8.9–10.3)
Chloride: 102 mmol/L (ref 98–111)
Creatinine, Ser: 1.3 mg/dL — ABNORMAL HIGH (ref 0.44–1.00)
GFR, Estimated: 39 mL/min — ABNORMAL LOW (ref >60)
Glucose, Bld: 142 mg/dL — ABNORMAL HIGH (ref 70–99)
Potassium: 3.7 mmol/L (ref 3.5–5.1)
Sodium: 146 mmol/L — ABNORMAL HIGH (ref 135–145)

## 2021-12-13 LAB — CBC
HCT: 26.6 % — ABNORMAL LOW (ref 36.0–46.0)
Hemoglobin: 8.3 g/dL — ABNORMAL LOW (ref 12.0–15.0)
MCH: 33.5 pg (ref 26.0–34.0)
MCHC: 31.2 g/dL (ref 30.0–36.0)
MCV: 107.3 fL — ABNORMAL HIGH (ref 80.0–100.0)
Platelets: 192 10*3/uL (ref 150–400)
RBC: 2.48 MIL/uL — ABNORMAL LOW (ref 3.87–5.11)
RDW: 14.2 % (ref 11.5–15.5)
WBC: 8.9 10*3/uL (ref 4.0–10.5)
nRBC: 0 % (ref 0.0–0.2)

## 2021-12-13 MED ORDER — MELATONIN 3 MG PO TABS
3.0000 mg | ORAL_TABLET | Freq: Every day | ORAL | Status: DC
  Administered 2021-12-13: 3 mg via ORAL
  Filled 2021-12-13: qty 1

## 2021-12-13 MED ORDER — FUROSEMIDE 20 MG PO TABS
20.0000 mg | ORAL_TABLET | Freq: Every day | ORAL | Status: DC
  Administered 2021-12-13 – 2021-12-14 (×2): 20 mg via ORAL
  Filled 2021-12-13 (×2): qty 1

## 2021-12-13 NOTE — Consult Note (Signed)
Consultation Note Date: 12/13/2021   Patient Name: [name]  DOB: 1930-02-06  MRN: [id number]  Age / Sex: 88 y.o., female  PCP: Mayra Neer, MD Referring Physician: Shelly Coss, MD  Reason for Consultation: Establishing goals of care  HPI/Patient Profile: 88 y.o. female  admitted on 12/11/2021   Clinical Assessment and Goals of Care: 88 year old lady from [place] assisted living facility reportedly has a hospice following her has underlying history of COPD and CHF admitted for exacerbations of the same, also has underlying history of rheumatoid arthritis and lung cancer neurocognitive disorder.  Patient presented with cough and shortness of breath.  Patient is on antibiotics and supplemental oxygen as well as attempts at diuresis.  Episodically, patient is complaining of not being able to breathe and asking for help and also having oxygen desaturations.  Palliative medicine consulted for ongoing goals of care discussions.  Patient continues with generalized weakness and minimal oral intake, not able to participate in physical therapy evaluation. Frail elderly lady sitting up in bed, she complains of shortness of breath states that she is not getting enough air and states," help me I cannot breathe." She had bedside RN assistance, patient to be given 0.5 mg of Ativan p.o., also started on nonrebreather mask with improved oxygen saturations.  Call placed and I was able to reach patient's daughter Shirleen Schirmer at [phone]: Palliative medicine is specialized medical care for people living with serious illness. It focuses on providing relief from the symptoms and stress of a serious illness. The goal is to improve quality of life for both the patient and the family. Goals of care: Broad aims of medical therapy in relation to the patient's values and preferences. Our aim is to provide medical care aimed at  enabling patients to achieve the goals that matter most to them, given the circumstances of their particular medical situation and their constraints.   Brief life review performed, goals wishes and values attempted to be explored.  Discussed about how this current hospitalization is going.  Patient's daughter states that she realizes the patient is extremely weak and cannot do physical therapy.  She does not want the patient to go back to her current assisted living facility.  She asks for the patient to have effective symptom management as well as good quality of life up until the end.  We discussed about patient's acute symptom burden as well as her frailty and ongoing acute and underlying serious illnesses and the fact that it places her at high risk for ongoing decline and decompensation and spite of current interventions.  Hence, we discussed about hospice philosophy of care, specifically the type of care that is provided inside a residential hospice facility.  Patient's daughter requests for residential hospice.   HCPOA Daughter [name]  SUMMARY OF RECOMMENDATIONS   DNR/DNI Continue current mode of care Pain and non pain symptom management, medication history reviewed. Attempt CT chest if at all possible otherwise discontinue Discontinue physical therapy attempts [facility] consult for residential hospice evaluation Thank you for the  consult. Code Status/Advance Care Planning: DNR   Symptom Management:     Palliative Prophylaxis:  Delirium Protocol  Psycho-social/Spiritual:  Desire for further Chaplaincy support:yes Additional Recommendations: Education on Hospice  Prognosis:  < 2 weeks  Discharge Planning: Hospice facility      Primary Diagnoses: Present on Admission:  Anemia of chronic disease  Depression, recurrent (HCC)  Rheumatoid arthritis-severe  COPD, mild ([place])  Coronary artery calcification  Neurocognitive disorder  Hypercholesterolemia  Acute respiratory failure  with hypoxia ([place])   I have reviewed the medical record, interviewed the patient and family, and examined the patient. The following aspects are pertinent.  Past Medical History:  Diagnosis Date   AKI (acute kidney injury) ([place]) 07/25/2021   Cancer ([place])    lung ca   COPD (chronic obstructive pulmonary disease) ([place])    COVID-19 virus infection 0/04/930   Hernia, umbilical    Hip pain, right    History of radiation therapy 05/02/12-05/04/12,&05/09/12   rul lung 54Gy/67fx   HLD (hyperlipidemia)    Hypotension 07/25/2021   Lung mass    Malignant neoplasm of lung ([place]) 04/01/2020   Myocardial infarction ([place]) 08/2017   Osteoporosis    PAC (premature atrial contraction)    RA (rheumatoid arthritis) ([place])    Shingles    Social History   Socioeconomic History   Marital status: Widowed    Spouse name: Not on file   Number of children: Not on file   Years of education: Not on file   Highest education level: Not on file  Occupational History   Occupation: retired    Fish farm manager: RETIRED  Tobacco Use   Smoking status: Former    Types: Cigarettes    Quit date: 1995    Years since quitting: 28.6   Smokeless tobacco: Never  Vaping Use   Vaping Use: Never used  Substance and Sexual Activity   Alcohol use: No   Drug use: Never   Sexual activity: Not on file  Other Topics Concern   Not on file  Social History Narrative   Lives in ALF at [facility].  08/2017 resident of [facility].    01-19-18 Unable to ask abuse questions daughter with her today.   Social Determinants of Health   Financial Resource Strain: Not on file  Food Insecurity: Not on file  Transportation Needs: Not on file  Physical Activity: Not on file  Stress: Not on file  Social Connections: Not on file   Family History  Problem Relation Age of Onset   Cancer Daughter    Cancer Mother    Scheduled Meds:  carvedilol  3.125 mg Oral BID WC   doxycycline  100 mg Oral Q12H   enoxaparin  (LOVENOX) injection  30 mg Subcutaneous Q24H   escitalopram  10 mg Oral Daily   famotidine  20 mg Oral Daily   furosemide  20 mg Oral Daily   guaiFENesin  600 mg Oral BID   ipratropium-albuterol  3 mL Nebulization Q6H   melatonin  3 mg Oral QHS   predniSONE  40 mg Oral Q breakfast   QUEtiapine  25 mg Oral QHS   sodium chloride flush  3 mL Intravenous Q12H   traZODone  100 mg Oral QHS   Continuous Infusions:  sodium chloride 10 mL/hr at 12/12/21 1639   cefTRIAXone (ROCEPHIN)  IV 1 g (12/13/21 0020)   PRN Meds:.sodium chloride, acetaminophen **OR** acetaminophen, albuterol, LORazepam, polyethylene glycol Medications Prior to Admission:  Prior to Admission  medications   Medication Sig Start Date End Date Taking? Authorizing Provider  acetaminophen (TYLENOL) 325 MG tablet Take 650 mg by mouth every 6 (six) hours as needed for mild pain.   Yes [provider]  albuterol (PROVENTIL) (2.5 MG/3ML) 0.083% nebulizer solution Take 2.5 mg by nebulization every 4 (four) hours as needed for wheezing or shortness of breath.   Yes [provider]  carvedilol (COREG) 3.125 MG tablet Take 1 tablet (3.125 mg total) by mouth 2 (two) times daily with a meal. 10/14/17  Yes Medina-Vargas, Monina C, NP  escitalopram (LEXAPRO) 10 MG tablet Take 10 mg by mouth daily. 10/13/21  Yes [provider]  famotidine (PEPCID) 20 MG tablet Take 1 tablet (20 mg total) by mouth daily. 08/04/21 08/04/22 Yes Ghimire, Henreitta Leber, MD  furosemide (LASIX) 40 MG tablet Take 40 mg by mouth daily.   Yes [provider]  LORazepam (ATIVAN) 0.5 MG tablet Take 0.5 mg by mouth every 4 (four) hours as needed for anxiety.   Yes [provider]  predniSONE (DELTASONE) 10 MG tablet Take 1 tablet (10 mg total) by mouth daily with breakfast. 06/25/21  Yes Deveshwar, Abel Presto, MD  QUEtiapine (SEROQUEL) 25 MG tablet Take 25 mg by mouth at bedtime.   Yes [provider]  traZODone (DESYREL) 100 MG  tablet Take 100 mg by mouth at bedtime.   Yes [provider]  Zinc Oxide 13 % CREA Apply 1 Application topically as needed (irritation).   Yes [provider]  ALPRAZolam (XANAX) 0.25 MG tablet Take 1 tablet (0.25 mg total) by mouth 2 (two) times daily as needed for anxiety. Patient not taking: Reported on 12/11/2021 08/04/21   Jonetta Osgood, MD  diclofenac Sodium (VOLTAREN) 1 % GEL Apply 2 g topically 4 (four) times daily. Patient not taking: Reported on 12/11/2021 03/14/21   Long, Wonda Olds, MD   Allergies  Allergen Reactions   Latex Other (See Comments)    Blisters on her mouth   Mouthwashes Shortness Of Breath and Other (See Comments)    MAGIC MOUTHWASH   Darvocet [Propoxyphene N-Acetaminophen] Nausea And Vomiting   Erythromycin Nausea And Vomiting   Hydrocodone-Acetaminophen Other (See Comments)    Dizziness    Ibandronic Acid Other (See Comments)    Muscle aches   Morphine And Related Nausea And Vomiting   Oxycodone Hcl Nausea And Vomiting   Oxycontin [Oxycodone Hcl] Nausea And Vomiting   Sulfa Antibiotics Nausea And Vomiting   Tramadol Nausea And Vomiting   Review of Systems Complains of shortness of breath Physical Exam Frail elderly lady sitting up in bed Has a diffuse ecchymosis/senile purpura on both upper and lower extremities Diminished breath sounds Appears with look of fear/anxiety due to air hunger Muscle wasting evident   Vital Signs: BP (!) 153/72 (BP Location: Left Arm)   Pulse 62   Temp 97.9 F (36.6 C) (Axillary)   Resp (!) 21   Wt 42.4 kg   SpO2 98%   BMI 20.94 kg/m  Pain Scale: 0-10   Pain Score: 0-No pain   SpO2: SpO2: 98 % O2 Device:SpO2: 98 % O2 Flow Rate: .O2 Flow Rate (L/min): 3 L/min  IO: Intake/output summary:  Intake/Output Summary (Last 24 hours) at 12/13/2021 1138 Last data filed at 12/13/2021 1100 Gross per 24 hour  Intake 290 ml  Output 1050 ml  Net -760 ml    LBM: Last BM Date : 12/12/21 Baseline  Weight: Weight: 44.3 kg Most recent weight:  Weight: 42.4 kg     Palliative Assessment/Data:     Palliative performance scale 30%  Time In: 10.30 Time Out: 11.30 Time Total: 60 Greater than 50%  of this time was spent counseling and coordinating care related to the above assessment and plan.  Signed by: Loistine Chance, MD   Please contact Palliative Medicine Team phone at [phone] for questions and concerns.  For individual provider: See Shea Evans

## 2021-12-13 NOTE — Progress Notes (Signed)
Patient called out to nursing c/o SOB. Patient appeared anxious, saturations down to 86%. O2 bumped from 3L up to 7L via [place], patient O2 sats still 86%. Dr. Rowe Pavy at bedside to see patient. In agreement patient was anxious and to give PRN ativan PO at this time. Patient requests "breathing mask". NRB given to patient, sats recovered to 100% and she is in NAD at this time. Dr. Tawanna Solo aware.

## 2021-12-13 NOTE — TOC Progression Note (Signed)
Transition of Care [facility]) - Progression Note    Patient Details  Name: [name] MRN: [id number] Date of Birth: 11/25/1929  Transition of Care [facility]) CM/SW Contact  Loreena Valeri, Juliann Pulse, RN Phone Number: 12/13/2021, 1:57 PM  Clinical Narrative:  Referral for residential hospice-spoke to dtr cindy chose [facility] aware of referral -await eval outcome.     Expected Discharge Plan: [place] Barriers to Discharge: Continued Medical Work up  Expected Discharge Plan and Services Expected Discharge Plan: [place]                                               Social Determinants of Health (SDOH) Interventions    Readmission Risk Interventions     No data to display

## 2021-12-13 NOTE — Progress Notes (Addendum)
PROGRESS NOTE  [name]  SAY:[id number] DOB: 08-01-1929 DOA: 12/11/2021 PCP: Mayra Neer, MD   Brief Narrative:  Patient is a 88 year old female with history of anemia, depression, lung cancer, rheumatoid arthritis, COPD, diastolic CHF, coronary artery disease, neurocognitive disorder, hyperlipidemia who presented from [facility] with complaints of shortness of breath.  She was coughing and dyspneic at nursing facility.  Also noted to have lower extremity edema.  On presentation ,she was hypertensive, saturating in the mid 80s requiring 4 L of oxygen.  Lab work showed BNP of 1694.  COVID/flu panel negative.  Chest x-ray showed chronic changes without acute findings.  Patient was admitted for the management of acute hypoxic respiratory failure secondary to COPD and CHF exacerbation.  Assessment & Plan:  Principal Problem:   Acute respiratory failure with hypoxia (HCC) Active Problems:   Anemia of chronic disease   Depression, recurrent (HCC)   Rheumatoid arthritis-severe   COPD, mild (HCC)   Coronary artery calcification   History of lung cancer   Neurocognitive disorder   Hypercholesterolemia   COPD with acute exacerbation (HCC)   Acute on chronic diastolic CHF (congestive heart failure) (HCC)    Acute on chronic  hypoxic respiratory failure: Presented with cough, worsening shortness of breath. She uses 3 L at baseline. Likely from concurrent COPD/CHF exacerbation.  Elevated BNP, she was wheezing on presentation.  This morning she was on 3 L of oxygen, not in respiratory distress. She was desaturating on 8/26 so CT scan was ordered but she declined and was not able to lie flat.  Empirically started on antibiotics which we will continue.  Chest x-ray on presentation did not show any clear pneumonia but showed chronic changes  Acute on chronic diastolic CHF exacerbation :started on Lasix 40 mg IV daily.  Echocardiogram showed EF of 60 to 65%, indeterminate left  ventricular diastolic parameters .  She appears almost euvolemic, creatinine trended up so we will stop IV Lasix, started on Lasix 20 mg daily.  Acute exacerbation of COPD: She was wheezing on arrival.  Started on steroids.  Continue bronchodilators.  Incentive spirometry  History of coronary artery disease/elevated troponin: No anginal symptoms.  Home carvedilol to be continued, mildly elevated troponin most likely from demand ischemia.  History of rheumatoid arthritis: Takes steroid.  Normocytic anemia: Currently hemoglobin stable at 9.3.  History of lung cancer: Currently on hospice services  Depression/neurocognitive disorder/dementia: On Lexapro, Seroquel, trazodone, as needed Ativan  GERD: On PPI  Goals of care/deconditioning/debility: Patient lives at nursing facility/ALF, currently enrolled in hospice.  Extremely elderly female.  Expect discharge back to nursing facility when stable and continue enrollment with hospice.  CODE STATUS is DNR.  Palliative care also consulted here.     Pressure Injury 12/12/21 Buttocks Mid Stage 2 -  Partial thickness loss of dermis presenting as a shallow open injury with a red, pink wound bed without slough. (Active)  12/12/21 0000  Location: Buttocks  Location Orientation: Mid  Staging: Stage 2 -  Partial thickness loss of dermis presenting as a shallow open injury with a red, pink wound bed without slough.  Wound Description (Comments):   Present on Admission: Yes  Dressing Type Foam - Lift dressing to assess site every shift 12/12/21 0750    DVT prophylaxis:enoxaparin (LOVENOX) injection 30 mg Start: 12/12/21 1000     Code Status: DNR  Family Communication: This was daughter at bedside on 8/27  Patient status:Inpatient  Patient is from :ALF  Anticipated discharge to:ALF  Estimated DC date:1-2 days   Consultants: None  Procedures:None  Antimicrobials:  Anti-infectives (From admission, onward)    Start     Dose/Rate Route  Frequency Ordered Stop   12/12/21 2200  cefTRIAXone (ROCEPHIN) 1 g in sodium chloride 0.9 % 100 mL IVPB        1 g 200 mL/hr over 30 Minutes Intravenous Every 24 hours 12/12/21 1419     12/12/21 2200  doxycycline (VIBRA-TABS) tablet 100 mg        100 mg Oral Every 12 hours 12/12/21 1519     12/12/21 1500  doxycycline (VIBRAMYCIN) 100 mg in sodium chloride 0.9 % 250 mL IVPB  Status:  Discontinued        100 mg 125 mL/hr over 120 Minutes Intravenous 2 times daily 12/12/21 1419 12/12/21 1519   12/11/21 2145  cefTRIAXone (ROCEPHIN) 1 g in sodium chloride 0.9 % 100 mL IVPB        1 g 200 mL/hr over 30 Minutes Intravenous  Once 12/11/21 2142 12/11/21 2231   12/11/21 2145  doxycycline (VIBRAMYCIN) 100 mg in sodium chloride 0.9 % 250 mL IVPB        100 mg 125 mL/hr over 120 Minutes Intravenous  Once 12/11/21 2142 12/12/21 0054       Subjective:  Patient seen and examined at the bedside this morning.  She was comfortably sleeping when I arrived.  Daughter at the bedside.  She was not in any Respiratory distress.  She was on 3 L of oxygen.  When asked how is she feeling, she says she just wants to sleep.  Denies any worsening shortness of breath or cough.     Objective: Vitals:   12/13/21 0431 12/13/21 0435 12/13/21 0529 12/13/21 0743  BP:  (!) 153/72    Pulse:  62    Resp:  (!) 21    Temp:  97.9 F (36.6 C)    TempSrc:  Axillary    SpO2:  99% 99% 98%  Weight: 42.4 kg       Intake/Output Summary (Last 24 hours) at 12/13/2021 1054 Last data filed at 12/13/2021 0431 Gross per 24 hour  Intake 240 ml  Output 1050 ml  Net -810 ml   Filed Weights   12/12/21 0500 12/13/21 0431  Weight: 44.3 kg 42.4 kg    Examination:  General exam: Overall comfortable, not in distress, very deconditioned, elderly female HEENT: PERRL Respiratory system: Mildly diminished air entry on both sides on the bases, no wheezes or crackles  Cardiovascular system: S1 & S2 heard, RRR.  Gastrointestinal  system: Abdomen is nondistended, soft and nontender. Central nervous system: Alert, awake but not oriented Extremities: No edema, no clubbing ,no cyanosis Skin: No rashes, superficial skin breakdown on the bilateral legs,no icterus     Data Reviewed: I have personally reviewed following labs and imaging studies  CBC: Recent Labs  Lab 12/11/21 2050 12/12/21 0541 12/13/21 0518  WBC 12.2* 10.8* 8.9  NEUTROABS 10.1*  --   --   HGB 9.3* 9.4* 8.3*  HCT 30.5* 31.1* 26.6*  MCV 107.4* 107.6* 107.3*  PLT 203 200 093   Basic Metabolic Panel: Recent Labs  Lab 12/11/21 2050 12/12/21 0541 12/13/21 0518  NA 144 144 146*  K 4.5 3.8 3.7  CL 103 102 102  CO2 32 31 35*  GLUCOSE 113* 155* 142*  BUN 34* 35* 43*  CREATININE 1.09* 1.15* 1.30*  CALCIUM 8.9 8.8* 8.4*     Recent Results (from the  past 240 hour(s))  Resp Panel by RT-PCR (Flu A&B, Covid) Anterior Nasal Swab     Status: None   Collection Time: 12/11/21  8:35 PM   Specimen: Anterior Nasal Swab  Result Value Ref Range Status   SARS Coronavirus 2 by RT PCR NEGATIVE NEGATIVE Final    Comment: (NOTE) SARS-CoV-2 target nucleic acids are NOT DETECTED.  The SARS-CoV-2 RNA is generally detectable in upper respiratory specimens during the acute phase of infection. The lowest concentration of SARS-CoV-2 viral copies this assay can detect is 138 copies/mL. A negative result does not preclude SARS-Cov-2 infection and should not be used as the sole basis for treatment or other patient management decisions. A negative result may occur with  improper specimen collection/handling, submission of specimen other than nasopharyngeal swab, presence of viral mutation(s) within the areas targeted by this assay, and inadequate number of viral copies(<138 copies/mL). A negative result must be combined with clinical observations, patient history, and epidemiological information. The expected result is Negative.  Fact Sheet for Patients:   EntrepreneurPulse.com.au  Fact Sheet for Healthcare Providers:  IncredibleEmployment.be  This test is no t yet approved or cleared by the [place] FDA and  has been authorized for detection and/or diagnosis of SARS-CoV-2 by FDA under an Emergency Use Authorization (EUA). This EUA will remain  in effect (meaning this test can be used) for the duration of the COVID-19 declaration under Section 564(b)(1) of the Act, 21 U.S.C.section 360bbb-3(b)(1), unless the authorization is terminated  or revoked sooner.       Influenza A by PCR NEGATIVE NEGATIVE Final   Influenza B by PCR NEGATIVE NEGATIVE Final    Comment: (NOTE) The Xpert Xpress SARS-CoV-2/FLU/RSV plus assay is intended as an aid in the diagnosis of influenza from Nasopharyngeal swab specimens and should not be used as a sole basis for treatment. Nasal washings and aspirates are unacceptable for Xpert Xpress SARS-CoV-2/FLU/RSV testing.  Fact Sheet for Patients: EntrepreneurPulse.com.au  Fact Sheet for Healthcare Providers: IncredibleEmployment.be  This test is not yet approved or cleared by the [place] FDA and has been authorized for detection and/or diagnosis of SARS-CoV-2 by FDA under an Emergency Use Authorization (EUA). This EUA will remain in effect (meaning this test can be used) for the duration of the COVID-19 declaration under Section 564(b)(1) of the Act, 21 U.S.C. section 360bbb-3(b)(1), unless the authorization is terminated or revoked.  Performed at [facility], [place] [address], [place], [place] [address]      Radiology Studies: ECHOCARDIOGRAM COMPLETE  Result Date: 12/12/2021    ECHOCARDIOGRAM REPORT   Patient Name:   BRANTLEY NASER Date of Exam: 12/12/2021 Medical Rec #:  [id number]      Height:       56.0 in Accession #:    [id number]     Weight:       97.7 lb Date of Birth:  03/03/1930      BSA:           1.309 m Patient Age:    53 years       BP:           140/80 mmHg Patient Gender: F              HR:           56 bpm. Exam Location:  Inpatient Procedure: 2D Echo, Cardiac Doppler and Color Doppler Indications:    Dyspnea R06.00  History:        Patient has prior  history of Echocardiogram examinations, most                 recent 08/26/2017. CAD, COPD, Arrythmias:PAC,                 Signs/Symptoms:Hypotension; Risk Factors:Former Smoker. Anemia.                 History of lung cancer.  Sonographer:    Darlina Sicilian RDCS Referring Phys: [id number] [place]  1. Left ventricular ejection fraction, by estimation, is 60 to 65%. The left ventricle has normal function. The left ventricle has no regional wall motion abnormalities. There is mild left ventricular hypertrophy. Left ventricular diastolic parameters are indeterminate.  2. Right ventricular systolic function is normal. The right ventricular size is normal. There is mildly elevated pulmonary artery systolic pressure.  3. The mitral valve is normal in structure. No evidence of mitral valve regurgitation. Moderate mitral annular calcification.  4. Aortic valve regurgitation is mild to moderate.  5. The inferior vena cava is normal in size with greater than 50% respiratory variability, suggesting right atrial pressure of 3 mmHg. Comparison(s): No prior Echocardiogram. FINDINGS  Left Ventricle: Left ventricular ejection fraction, by estimation, is 60 to 65%. The left ventricle has normal function. The left ventricle has no regional wall motion abnormalities. The left ventricular internal cavity size was normal in size. There is  mild left ventricular hypertrophy. Left ventricular diastolic parameters are indeterminate. Right Ventricle: The right ventricular size is normal. Right ventricular systolic function is normal. There is mildly elevated pulmonary artery systolic pressure. The tricuspid regurgitant velocity is 3.23 m/s, and with an assumed  right atrial pressure of 3 mmHg, the estimated right ventricular systolic pressure is 38.2 mmHg. Left Atrium: Left atrial size was normal in size. Right Atrium: Right atrial size was normal in size. Pericardium: There is no evidence of pericardial effusion. Mitral Valve: The mitral valve is normal in structure. Moderate mitral annular calcification. No evidence of mitral valve regurgitation. Tricuspid Valve: Tricuspid valve regurgitation is mild. Aortic Valve: Aortic valve regurgitation is mild to moderate. Aortic regurgitation PHT measures 457 msec. Aortic valve mean gradient measures 9.7 mmHg. Aortic valve peak gradient measures 21.0 mmHg. Aortic valve area, by VTI measures 1.26 cm. Pulmonic Valve: Pulmonic valve regurgitation is not visualized. Aorta: The aortic root and ascending aorta are structurally normal, with no evidence of dilitation. Venous: The inferior vena cava is normal in size with greater than 50% respiratory variability, suggesting right atrial pressure of 3 mmHg. IAS/Shunts: The interatrial septum was not well visualized.  LEFT VENTRICLE PLAX 2D LVIDd:         3.90 cm   Diastology LVIDs:         2.60 cm   LV e' medial:    4.22 cm/s LV PW:         0.90 cm   LV E/e' medial:  28.0 LV IVS:        1.30 cm   LV e' lateral:   6.57 cm/s LVOT diam:     1.70 cm   LV E/e' lateral: 18.0 LV SV:         62 LV SV Index:   48 LVOT Area:     2.27 cm  RIGHT VENTRICLE RV Basal diam:  3.05 cm RV Mid diam:    2.60 cm RV S prime:     14.00 cm/s TAPSE (M-mode): 2.0 cm LEFT ATRIUM  Index        RIGHT ATRIUM           Index LA diam:        3.40 cm 2.60 cm/m   RA Area:     17.60 cm LA Vol (A2C):   68.5 ml 52.32 ml/m  RA Volume:   43.30 ml  33.07 ml/m LA Vol (A4C):   45.6 ml 34.83 ml/m LA Biplane Vol: 58.5 ml 44.68 ml/m  AORTIC VALVE AV Area (Vmax):    1.20 cm AV Area (Vmean):   1.25 cm AV Area (VTI):     1.26 cm AV Vmax:           229.33 cm/s AV Vmean:          142.333 cm/s AV VTI:            0.494 m  AV Peak Grad:      21.0 mmHg AV Mean Grad:      9.7 mmHg LVOT Vmax:         121.00 cm/s LVOT Vmean:        78.100 cm/s LVOT VTI:          0.274 m LVOT/AV VTI ratio: 0.55 AI PHT:            457 msec  AORTA Ao Root diam: 3.50 cm Ao Asc diam:  3.25 cm MITRAL VALVE                TRICUSPID VALVE MV Area (PHT): 4.26 cm     TR Peak grad:   41.7 mmHg MV Decel Time: 178 msec     TR Vmax:        323.00 cm/s MV E velocity: 118.00 cm/s MV A velocity: 120.00 cm/s  SHUNTS MV E/A ratio:  0.98         Systemic VTI:  0.27 m                             Systemic Diam: 1.70 cm Phineas Inches Electronically signed by Phineas Inches Signature Date/Time: 12/12/2021/1:10:49 PM    Final    DG Chest Port 1 View  Result Date: 12/11/2021 CLINICAL DATA:  Chest pain and cough EXAM: PORTABLE CHEST 1 VIEW COMPARISON:  08/04/2021 FINDINGS: Cardiac shadow is stable. Aortic calcifications are noted. Lungs are hyperinflated bilaterally. Stable scarring is noted in the right upper lung consistent with posttherapy change. Postsurgical changes are noted in the right apex as well. Patient rotation to the right is noted with accentuation of the mediastinal markings particularly in the right hilum. Some prominence is noted related to prior scarring and stable from the prior exam. No new focal infiltrate is seen. No bony abnormality is noted. IMPRESSION: Chronic changes as described without acute abnormality. Electronically Signed   By: Inez Catalina M.D.   On: 12/11/2021 20:28    Scheduled Meds:  carvedilol  3.125 mg Oral BID WC   doxycycline  100 mg Oral Q12H   enoxaparin (LOVENOX) injection  30 mg Subcutaneous Q24H   escitalopram  10 mg Oral Daily   famotidine  20 mg Oral Daily   furosemide  20 mg Oral Daily   guaiFENesin  600 mg Oral BID   ipratropium-albuterol  3 mL Nebulization Q6H   melatonin  3 mg Oral QHS   predniSONE  40 mg Oral Q breakfast   QUEtiapine  25 mg Oral QHS   sodium chloride flush  3  mL Intravenous Q12H   traZODone  100 mg  Oral QHS   Continuous Infusions:  sodium chloride 10 mL/hr at 12/12/21 1639   cefTRIAXone (ROCEPHIN)  IV 1 g (12/13/21 0020)     LOS: 1 day   Shelly Coss, MD [facility] P8/27/2023, 10:54 AM

## 2021-12-13 NOTE — Progress Notes (Signed)
[facility] ([facility]) Hospital Liaison Note  Referral received for patient/family interest in [facility]. Chart under review by [facility] physician.   Hospice eligibility pending.   Please call with any questions or concerns. Thank you   Shanita Wicker, LCSW [facility] Hospital Liaison  [phone] 

## 2021-12-13 NOTE — Progress Notes (Signed)
Patient back to 3L O2 via [place] sats 98% in NAD.

## 2021-12-13 NOTE — Progress Notes (Signed)
Patient declines CT chest throughout the day. Patient states she is unable to tolerate laying flat d/t difficulty breathing in that position. MD and daughter are aware.

## 2021-12-14 DIAGNOSIS — J9601 Acute respiratory failure with hypoxia: Secondary | ICD-10-CM | POA: Diagnosis not present

## 2021-12-14 MED ORDER — IPRATROPIUM-ALBUTEROL 0.5-2.5 (3) MG/3ML IN SOLN
3.0000 mL | Freq: Three times a day (TID) | RESPIRATORY_TRACT | Status: DC
Start: 2021-12-14 — End: 2021-12-15
  Administered 2021-12-14 (×3): 3 mL via RESPIRATORY_TRACT
  Filled 2021-12-14 (×3): qty 3

## 2021-12-14 NOTE — Progress Notes (Signed)
Daily Progress Note   Patient Name: [name]       Date: 12/14/2021 DOB: 1929/11/30  Age: 88 y.o. MRN#: [id number] Attending Physician: Shelly Coss, MD Primary Care Physician: Mayra Neer, MD Admit Date: 12/11/2021  Reason for Consultation/Follow-up: Terminal Care  Subjective: Awake alert sitting up in bed appears frail and weak  Length of Stay: 2  Current Medications: Scheduled Meds:   carvedilol  3.125 mg Oral BID WC   doxycycline  100 mg Oral Q12H   escitalopram  10 mg Oral Daily   famotidine  20 mg Oral Daily   furosemide  20 mg Oral Daily   guaiFENesin  600 mg Oral BID   ipratropium-albuterol  3 mL Nebulization TID   melatonin  3 mg Oral QHS   predniSONE  40 mg Oral Q breakfast   QUEtiapine  25 mg Oral QHS   sodium chloride flush  3 mL Intravenous Q12H   traZODone  100 mg Oral QHS    Continuous Infusions:  sodium chloride 10 mL/hr at 12/12/21 1639   cefTRIAXone (ROCEPHIN)  IV 1 g (12/13/21 2146)    PRN Meds: sodium chloride, acetaminophen **OR** acetaminophen, albuterol, LORazepam, polyethylene glycol  Physical Exam         Frail elderly lady resting in bed Shallow regular work of breathing Generalized weakness and deconditioning evident  Vital Signs: BP (!) 153/57 (BP Location: Left Arm)   Pulse 66   Temp 98.5 F (36.9 C) (Oral)   Resp 15   Ht 4\' 10"  (1.473 m)   Wt 44 kg   SpO2 93%   BMI 20.27 kg/m  SpO2: SpO2: 93 % O2 Device: O2 Device: Nasal Cannula O2 Flow Rate: O2 Flow Rate (L/min): 5 L/min  Intake/output summary:  Intake/Output Summary (Last 24 hours) at 12/14/2021 1218 Last data filed at 12/14/2021 1211 Gross per 24 hour  Intake 480 ml  Output 0 ml  Net 480 ml   LBM: Last BM Date : 12/14/21 Baseline Weight: Weight: 44.3 kg Most  recent weight: Weight: 44 kg       Palliative Assessment/Data:      Patient Active Problem List   Diagnosis Date Noted   COPD with acute exacerbation ([place]) 12/11/2021   Acute on chronic diastolic CHF (congestive heart failure) ([place]) 12/11/2021   Acute respiratory failure  with hypoxia ([place]) 12/11/2021   Sepsis ([place]) 07/25/2021   Plantar wart 04/21/2021   Spinal stenosis 04/21/2021   Recurrent major depression in remission ([place]) 02/19/2021   Acute non-ST segment elevation myocardial infarction [facility]) 04/01/2020   Ataxia 04/01/2020   Frail elderly 04/01/2020   Heart disease 04/01/2020   Hypercholesterolemia 04/01/2020   Insomnia 04/01/2020   Ovarian mass 04/01/2020   Early stage nonexudative age-related macular degeneration of both eyes 02/26/2020   Posterior vitreous detachment of both eyes 02/26/2020   Abnormal CT of the chest 09/01/2017   Adult failure to thrive 09/01/2017   Neurocognitive disorder 08/30/2017   NSTEMI (non-ST elevated myocardial infarction) ([place]) 08/25/2017   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 Secondary to either irritation by dentures, vs. secondary to lung neoplasm 12/23/2012   Rheumatoid arthritis-severe 12/23/2012   COPD, mild ([place]) 12/23/2012   Osteoporosis 12/23/2012   Primary cancer of right upper lobe of lung ([place]) 03/21/2012    Palliative Care Assessment & Plan   Patient Profile:   Assessment:   88 year old lady from [place] assisted living facility reportedly has a hospice following her has underlying history of COPD and CHF admitted for exacerbations of the same, also has underlying history of rheumatoid arthritis and lung cancer neurocognitive disorder.  Patient presented with cough and shortness of breath.  Patient is on antibiotics and supplemental oxygen as well as attempts at diuresis.  Episodically, patient is complaining of not being able to breathe and asking for help and also having oxygen desaturations.  Palliative medicine consulted for ongoing goals of care discussions.  Patient continues with generalized weakness and minimal oral intake, not able to participate in physical therapy evaluation.  Recommendations/Plan: Residential hospice when bed available  Goals of Care and Additional Recommendations: Limitations on Scope of Treatment: Full Comfort Care  Code Status:    Code Status Orders  (From admission, onward)           Start     Ordered   12/11/21 2204  Do not attempt resuscitation (DNR)  Continuous       Question Answer Comment  In the event of cardiac or respiratory ARREST Do not call a "code blue"   In the event of cardiac or respiratory ARREST Do not perform Intubation, CPR, defibrillation or ACLS   In the event of cardiac or respiratory ARREST Use medication by any route, position, wound care, and other measures to relive pain and suffering. May use oxygen, suction and manual treatment of airway obstruction as needed for comfort.      12/11/21 2206           Code Status History     Date Active Date Inactive Code Status Order ID Comments User Context   07/26/2021 1202 08/06/2021 1928 DNR [id number]  Nolberto Hanlon, MD Inpatient   07/25/2021 2311 07/26/2021 1202 Full Code [id number]  Orene Desanctis, DO ED   08/25/2017 1923 08/29/2017 2210 Full Code [id number]  Lonn Georgia, PA-C Inpatient   12/23/2012 1631 12/28/2012 1657  Full Code [id number]  Nita Sells,  MD Inpatient      Advance Directive Documentation    Flowsheet Row Most Recent Value  Type of Advance Directive Living will  Pre-existing out of facility DNR order (yellow form or pink MOST form) --  "MOST" Form in Place? --       Prognosis:  < 2 weeks  Discharge Planning: Hospice facility  Care plan was discussed with  IDT  Thank you for allowing the Palliative Medicine Team to assist in the care of this patient.  MOD MDM.     Greater than 50%  of this time was spent counseling and coordinating care related to the above assessment and plan.  Loistine Chance, MD  Please contact Palliative Medicine Team phone at [phone] for questions and concerns.

## 2021-12-14 NOTE — Discharge Summary (Signed)
Physician Discharge Summary  [name] IHW:[id number] DOB: 1930/02/28 DOA: 12/11/2021  PCP: Mayra Neer, MD  Admit date: 12/11/2021 Discharge date: 12/14/2021  Admitted From: Home Disposition:  Home  Discharge Condition:Stable CODE STATUS:DNR Diet recommendation: regular  Brief/Interim Summary:  Patient is a 88 year old female with history of anemia, depression, lung cancer, rheumatoid arthritis, COPD, diastolic CHF, coronary artery disease, neurocognitive disorder, hyperlipidemia who presented from [facility] with complaints of shortness of breath.  She was coughing and dyspneic at nursing facility.  Also noted to have lower extremity edema.  On presentation ,she was hypertensive, saturating in the mid 80s requiring 4 L of oxygen.  Lab work showed BNP of 1694.  COVID/flu panel negative.  Chest x-ray showed chronic changes without acute findings.  Patient was admitted for the management of acute hypoxic respiratory failure secondary to COPD and CHF exacerbation. Extensive goals of care discussion was done with family.  Palliative care also consulted.  After discussion, family agreeable for residential hospice.  Patient is being transferred to residential  hospice today.  Following problems were addressed during hospitalization:  Acute on chronic  hypoxic respiratory failure: Presented with cough, worsening shortness of breath. She uses 3 L at baseline. Likely from concurrent COPD/CHF exacerbation.  Elevated BNP, she was wheezing on presentation.   She was desaturating on 8/26 so CT scan was ordered but she declined and was not able to lie flat.  Empirically started on antibiotics .  Chest x-ray on presentation did not show any clear pneumonia but showed chronic changes.Now on comfort care   Acute on chronic diastolic CHF exacerbation :  Echocardiogram showed EF of 60 to 65%, indeterminate left ventricular diastolic parameters .    Acute exacerbation of COPD: She was  wheezing on arrival.  Started on steroids.    History of coronary artery disease/elevated troponin: No anginal symptoms.     History of rheumatoid arthritis: Takes steroid.   Normocytic anemia: Currently hemoglobin stable at 9.3.   History of lung cancer: Currently on hospice services   Depression/neurocognitive disorder/dementia: On Lexapro, Seroquel, trazodone   GERD: On PPI   Goals of care/deconditioning/debility: Patient lives at nursing facility/ALF, currently enrolled in hospice.  Extremely elderly female.  Palliative care consulted, family decided to transfer to residential hospice  Discharge Diagnoses:  Principal Problem:   Acute respiratory failure with hypoxia ([place]) Active Problems:   Anemia of chronic disease   Depression, recurrent (HCC)   Rheumatoid arthritis-severe   COPD, mild (HCC)   Coronary artery calcification   History of lung cancer   Neurocognitive disorder   Hypercholesterolemia   COPD with acute exacerbation (HCC)   Acute on chronic diastolic CHF (congestive heart failure) [facility])    Discharge Instructions  Discharge Instructions     Diet general   Complete by: As directed    No wound care   Complete by: As directed       Allergies as of 12/14/2021       Reactions   Latex Other (See Comments)   Blisters on her mouth   Mouthwashes Shortness Of Breath, Other (See Comments)   MAGIC MOUTHWASH   Darvocet [propoxyphene N-acetaminophen] Nausea And Vomiting   Erythromycin Nausea And Vomiting   Hydrocodone-acetaminophen Other (See Comments)   Dizziness   Ibandronic Acid Other (See Comments)   Muscle aches   Morphine And Related Nausea And Vomiting   Oxycodone Hcl Nausea And Vomiting   Oxycontin [oxycodone Hcl] Nausea And Vomiting   Sulfa Antibiotics Nausea And Vomiting  Tramadol Nausea And Vomiting        Medication List     STOP taking these medications    acetaminophen 325 MG tablet Commonly known as: TYLENOL   albuterol (2.5  MG/3ML) 0.083% nebulizer solution Commonly known as: PROVENTIL   ALPRAZolam 0.25 MG tablet Commonly known as: XANAX   carvedilol 3.125 MG tablet Commonly known as: COREG   diclofenac Sodium 1 % Gel Commonly known as: Voltaren   escitalopram 10 MG tablet Commonly known as: LEXAPRO   famotidine 20 MG tablet Commonly known as: Pepcid   furosemide 40 MG tablet Commonly known as: LASIX   LORazepam 0.5 MG tablet Commonly known as: ATIVAN   predniSONE 10 MG tablet Commonly known as: DELTASONE   QUEtiapine 25 MG tablet Commonly known as: SEROQUEL   traZODone 100 MG tablet Commonly known as: DESYREL   Zinc Oxide 13 % Crea        Allergies  Allergen Reactions   Latex Other (See Comments)    Blisters on her mouth   Mouthwashes Shortness Of Breath and Other (See Comments)    MAGIC MOUTHWASH   Darvocet [Propoxyphene N-Acetaminophen] Nausea And Vomiting   Erythromycin Nausea And Vomiting   Hydrocodone-Acetaminophen Other (See Comments)    Dizziness    Ibandronic Acid Other (See Comments)    Muscle aches   Morphine And Related Nausea And Vomiting   Oxycodone Hcl Nausea And Vomiting   Oxycontin [Oxycodone Hcl] Nausea And Vomiting   Sulfa Antibiotics Nausea And Vomiting   Tramadol Nausea And Vomiting    Consultations: Palliative care   Procedures/Studies: ECHOCARDIOGRAM COMPLETE  Result Date: 12/12/2021    ECHOCARDIOGRAM REPORT   Patient Name:   [name] Date of Exam: 12/12/2021 Medical Rec #:  [id number]      Height:       56.0 in Accession #:    [id number]     Weight:       97.7 lb Date of Birth:  1929-10-02      BSA:          1.309 m Patient Age:    88 years       BP:           140/80 mmHg Patient Gender: F              HR:           56 bpm. Exam Location:  Inpatient Procedure: 2D Echo, Cardiac Doppler and Color Doppler Indications:    Dyspnea R06.00  History:        Patient has prior history of Echocardiogram examinations, most                 recent  08/26/2017. CAD, COPD, Arrythmias:PAC,                 Signs/Symptoms:Hypotension; Risk Factors:Former Smoker. Anemia.                 History of lung cancer.  Sonographer:    Darlina Sicilian RDCS Referring Phys: [id number] [place]  1. Left ventricular ejection fraction, by estimation, is 60 to 65%. The left ventricle has normal function. The left ventricle has no regional wall motion abnormalities. There is mild left ventricular hypertrophy. Left ventricular diastolic parameters are indeterminate.  2. Right ventricular systolic function is normal. The right ventricular size is normal. There is mildly elevated pulmonary artery systolic pressure.  3. The mitral valve is normal in structure. No evidence of mitral valve  regurgitation. Moderate mitral annular calcification.  4. Aortic valve regurgitation is mild to moderate.  5. The inferior vena cava is normal in size with greater than 50% respiratory variability, suggesting right atrial pressure of 3 mmHg. Comparison(s): No prior Echocardiogram. FINDINGS  Left Ventricle: Left ventricular ejection fraction, by estimation, is 60 to 65%. The left ventricle has normal function. The left ventricle has no regional wall motion abnormalities. The left ventricular internal cavity size was normal in size. There is  mild left ventricular hypertrophy. Left ventricular diastolic parameters are indeterminate. Right Ventricle: The right ventricular size is normal. Right ventricular systolic function is normal. There is mildly elevated pulmonary artery systolic pressure. The tricuspid regurgitant velocity is 3.23 m/s, and with an assumed right atrial pressure of 3 mmHg, the estimated right ventricular systolic pressure is 95.1 mmHg. Left Atrium: Left atrial size was normal in size. Right Atrium: Right atrial size was normal in size. Pericardium: There is no evidence of pericardial effusion. Mitral Valve: The mitral valve is normal in structure. Moderate mitral  annular calcification. No evidence of mitral valve regurgitation. Tricuspid Valve: Tricuspid valve regurgitation is mild. Aortic Valve: Aortic valve regurgitation is mild to moderate. Aortic regurgitation PHT measures 457 msec. Aortic valve mean gradient measures 9.7 mmHg. Aortic valve peak gradient measures 21.0 mmHg. Aortic valve area, by VTI measures 1.26 cm. Pulmonic Valve: Pulmonic valve regurgitation is not visualized. Aorta: The aortic root and ascending aorta are structurally normal, with no evidence of dilitation. Venous: The inferior vena cava is normal in size with greater than 50% respiratory variability, suggesting right atrial pressure of 3 mmHg. IAS/Shunts: The interatrial septum was not well visualized.  LEFT VENTRICLE PLAX 2D LVIDd:         3.90 cm   Diastology LVIDs:         2.60 cm   LV e' medial:    4.22 cm/s LV PW:         0.90 cm   LV E/e' medial:  28.0 LV IVS:        1.30 cm   LV e' lateral:   6.57 cm/s LVOT diam:     1.70 cm   LV E/e' lateral: 18.0 LV SV:         62 LV SV Index:   48 LVOT Area:     2.27 cm  RIGHT VENTRICLE RV Basal diam:  3.05 cm RV Mid diam:    2.60 cm RV S prime:     14.00 cm/s TAPSE (M-mode): 2.0 cm LEFT ATRIUM             Index        RIGHT ATRIUM           Index LA diam:        3.40 cm 2.60 cm/m   RA Area:     17.60 cm LA Vol (A2C):   68.5 ml 52.32 ml/m  RA Volume:   43.30 ml  33.07 ml/m LA Vol (A4C):   45.6 ml 34.83 ml/m LA Biplane Vol: 58.5 ml 44.68 ml/m  AORTIC VALVE AV Area (Vmax):    1.20 cm AV Area (Vmean):   1.25 cm AV Area (VTI):     1.26 cm AV Vmax:           229.33 cm/s AV Vmean:          142.333 cm/s AV VTI:            0.494 m AV Peak Grad:  21.0 mmHg AV Mean Grad:      9.7 mmHg LVOT Vmax:         121.00 cm/s LVOT Vmean:        78.100 cm/s LVOT VTI:          0.274 m LVOT/AV VTI ratio: 0.55 AI PHT:            457 msec  AORTA Ao Root diam: 3.50 cm Ao Asc diam:  3.25 cm MITRAL VALVE                TRICUSPID VALVE MV Area (PHT): 4.26 cm     TR Peak  grad:   41.7 mmHg MV Decel Time: 178 msec     TR Vmax:        323.00 cm/s MV E velocity: 118.00 cm/s MV A velocity: 120.00 cm/s  SHUNTS MV E/A ratio:  0.98         Systemic VTI:  0.27 m                             Systemic Diam: 1.70 cm Phineas Inches Electronically signed by Phineas Inches Signature Date/Time: 12/12/2021/1:10:49 PM    Final    DG Chest Port 1 View  Result Date: 12/11/2021 CLINICAL DATA:  Chest pain and cough EXAM: PORTABLE CHEST 1 VIEW COMPARISON:  08/04/2021 FINDINGS: Cardiac shadow is stable. Aortic calcifications are noted. Lungs are hyperinflated bilaterally. Stable scarring is noted in the right upper lung consistent with posttherapy change. Postsurgical changes are noted in the right apex as well. Patient rotation to the right is noted with accentuation of the mediastinal markings particularly in the right hilum. Some prominence is noted related to prior scarring and stable from the prior exam. No new focal infiltrate is seen. No bony abnormality is noted. IMPRESSION: Chronic changes as described without acute abnormality. Electronically Signed   By: Inez Catalina M.D.   On: 12/11/2021 20:28      Subjective: Patient seen and examined at the bedside this morning.  Not in apparent distress, sleeping comfortably.  Discharge Exam: Vitals:   12/14/21 0532 12/14/21 0732  BP: (!) 153/57   Pulse: 66   Resp: 15   Temp: 98.5 F (36.9 C)   SpO2: 97% 93%   Vitals:   12/14/21 0500 12/14/21 0532 12/14/21 0732 12/14/21 1111  BP:  (!) 153/57    Pulse:  66    Resp:  15    Temp:  98.5 F (36.9 C)    TempSrc:  Oral    SpO2:  97% 93%   Weight: 44 kg     Height:    4\' 10"  (1.473 m)    General: Pt is not in acute distress, very deconditioned, weak Cardiovascular: RRR, S1/S2 +, no rubs, no gallops Respiratory: Diminished air sounds bilaterally Abdominal: Soft, NT, ND, bowel sounds + Extremities: no edema, no cyanosis    The results of significant diagnostics from this  hospitalization (including imaging, microbiology, ancillary and laboratory) are listed below for reference.     Microbiology: Recent Results (from the past 240 hour(s))  Resp Panel by RT-PCR (Flu A&B, Covid) Anterior Nasal Swab     Status: None   Collection Time: 12/11/21  8:35 PM   Specimen: Anterior Nasal Swab  Result Value Ref Range Status   SARS Coronavirus 2 by RT PCR NEGATIVE NEGATIVE Final    Comment: (NOTE) SARS-CoV-2 target nucleic acids are NOT DETECTED.  The SARS-CoV-2 RNA is generally detectable in upper respiratory specimens during the acute phase of infection. The lowest concentration of SARS-CoV-2 viral copies this assay can detect is 138 copies/mL. A negative result does not preclude SARS-Cov-2 infection and should not be used as the sole basis for treatment or other patient management decisions. A negative result may occur with  improper specimen collection/handling, submission of specimen other than nasopharyngeal swab, presence of viral mutation(s) within the areas targeted by this assay, and inadequate number of viral copies(<138 copies/mL). A negative result must be combined with clinical observations, patient history, and epidemiological information. The expected result is Negative.  Fact Sheet for Patients:  EntrepreneurPulse.com.au  Fact Sheet for Healthcare Providers:  IncredibleEmployment.be  This test is no t yet approved or cleared by the [place] FDA and  has been authorized for detection and/or diagnosis of SARS-CoV-2 by FDA under an Emergency Use Authorization (EUA). This EUA will remain  in effect (meaning this test can be used) for the duration of the COVID-19 declaration under Section 564(b)(1) of the Act, 21 U.S.C.section 360bbb-3(b)(1), unless the authorization is terminated  or revoked sooner.       Influenza A by PCR NEGATIVE NEGATIVE Final   Influenza B by PCR NEGATIVE NEGATIVE Final    Comment:  (NOTE) The Xpert Xpress SARS-CoV-2/FLU/RSV plus assay is intended as an aid in the diagnosis of influenza from Nasopharyngeal swab specimens and should not be used as a sole basis for treatment. Nasal washings and aspirates are unacceptable for Xpert Xpress SARS-CoV-2/FLU/RSV testing.  Fact Sheet for Patients: EntrepreneurPulse.com.au  Fact Sheet for Healthcare Providers: IncredibleEmployment.be  This test is not yet approved or cleared by the [place] FDA and has been authorized for detection and/or diagnosis of SARS-CoV-2 by FDA under an Emergency Use Authorization (EUA). This EUA will remain in effect (meaning this test can be used) for the duration of the COVID-19 declaration under Section 564(b)(1) of the Act, 21 U.S.C. section 360bbb-3(b)(1), unless the authorization is terminated or revoked.  Performed at [facility], [place] [address] [place], [place] [address]      Labs: BNP (last 3 results) Recent Labs    07/26/21 0455 12/11/21 2050  BNP 1,063.6* 9,563.8*   Basic Metabolic Panel: Recent Labs  Lab 12/11/21 2050 12/12/21 0541 12/13/21 0518  NA 144 144 146*  K 4.5 3.8 3.7  CL 103 102 102  CO2 32 31 35*  GLUCOSE 113* 155* 142*  BUN 34* 35* 43*  CREATININE 1.09* 1.15* 1.30*  CALCIUM 8.9 8.8* 8.4*   Liver Function Tests: Recent Labs  Lab 12/11/21 2050 12/12/21 0541  AST 18 16  ALT 14 15  ALKPHOS 44 42  BILITOT 0.5 0.8  PROT 6.5 6.6  ALBUMIN 3.2* 3.2*   No results for input(s): "LIPASE", "AMYLASE" in the last 168 hours. No results for input(s): "AMMONIA" in the last 168 hours. CBC: Recent Labs  Lab 12/11/21 2050 12/12/21 0541 12/13/21 0518  WBC 12.2* 10.8* 8.9  NEUTROABS 10.1*  --   --   HGB 9.3* 9.4* 8.3*  HCT 30.5* 31.1* 26.6*  MCV 107.4* 107.6* 107.3*  PLT 203 200 192   Cardiac Enzymes: No results for input(s): "CKTOTAL", "CKMB", "CKMBINDEX", "TROPONINI" in the last 168  hours. BNP: Invalid input(s): "POCBNP" CBG: No results for input(s): "GLUCAP" in the last 168 hours. D-Dimer No results for input(s): "DDIMER" in the last 72 hours. Hgb A1c No results for input(s): "HGBA1C" in the last 72 hours. Lipid  Profile No results for input(s): "CHOL", "HDL", "LDLCALC", "TRIG", "CHOLHDL", "LDLDIRECT" in the last 72 hours. Thyroid function studies No results for input(s): "TSH", "T4TOTAL", "T3FREE", "THYROIDAB" in the last 72 hours.  Invalid input(s): "FREET3" Anemia work up No results for input(s): "VITAMINB12", "FOLATE", "FERRITIN", "TIBC", "IRON", "RETICCTPCT" in the last 72 hours. Urinalysis    Component Value Date/Time   COLORURINE COLORLESS (A) 12/11/2021 0653   APPEARANCEUR CLEAR 12/11/2021 0653   LABSPEC 1.008 12/11/2021 0653   PHURINE 5.0 12/11/2021 0653   GLUCOSEU NEGATIVE 12/11/2021 0653   HGBUR MODERATE (A) 12/11/2021 0653   BILIRUBINUR NEGATIVE 12/11/2021 0653   KETONESUR NEGATIVE 12/11/2021 0653   PROTEINUR NEGATIVE 12/11/2021 0653   UROBILINOGEN 0.2 12/23/2012 1448   NITRITE NEGATIVE 12/11/2021 0653   LEUKOCYTESUR NEGATIVE 12/11/2021 0653   Sepsis Labs Recent Labs  Lab 12/11/21 2050 12/12/21 0541 12/13/21 0518  WBC 12.2* 10.8* 8.9   Microbiology Recent Results (from the past 240 hour(s))  Resp Panel by RT-PCR (Flu A&B, Covid) Anterior Nasal Swab     Status: None   Collection Time: 12/11/21  8:35 PM   Specimen: Anterior Nasal Swab  Result Value Ref Range Status   SARS Coronavirus 2 by RT PCR NEGATIVE NEGATIVE Final    Comment: (NOTE) SARS-CoV-2 target nucleic acids are NOT DETECTED.  The SARS-CoV-2 RNA is generally detectable in upper respiratory specimens during the acute phase of infection. The lowest concentration of SARS-CoV-2 viral copies this assay can detect is 138 copies/mL. A negative result does not preclude SARS-Cov-2 infection and should not be used as the sole basis for treatment or other patient management  decisions. A negative result may occur with  improper specimen collection/handling, submission of specimen other than nasopharyngeal swab, presence of viral mutation(s) within the areas targeted by this assay, and inadequate number of viral copies(<138 copies/mL). A negative result must be combined with clinical observations, patient history, and epidemiological information. The expected result is Negative.  Fact Sheet for Patients:  EntrepreneurPulse.com.au  Fact Sheet for Healthcare Providers:  IncredibleEmployment.be  This test is no t yet approved or cleared by the [place] FDA and  has been authorized for detection and/or diagnosis of SARS-CoV-2 by FDA under an Emergency Use Authorization (EUA). This EUA will remain  in effect (meaning this test can be used) for the duration of the COVID-19 declaration under Section 564(b)(1) of the Act, 21 U.S.C.section 360bbb-3(b)(1), unless the authorization is terminated  or revoked sooner.       Influenza A by PCR NEGATIVE NEGATIVE Final   Influenza B by PCR NEGATIVE NEGATIVE Final    Comment: (NOTE) The Xpert Xpress SARS-CoV-2/FLU/RSV plus assay is intended as an aid in the diagnosis of influenza from Nasopharyngeal swab specimens and should not be used as a sole basis for treatment. Nasal washings and aspirates are unacceptable for Xpert Xpress SARS-CoV-2/FLU/RSV testing.  Fact Sheet for Patients: EntrepreneurPulse.com.au  Fact Sheet for Healthcare Providers: IncredibleEmployment.be  This test is not yet approved or cleared by the [place] FDA and has been authorized for detection and/or diagnosis of SARS-CoV-2 by FDA under an Emergency Use Authorization (EUA). This EUA will remain in effect (meaning this test can be used) for the duration of the COVID-19 declaration under Section 564(b)(1) of the Act, 21 U.S.C. section 360bbb-3(b)(1), unless the  authorization is terminated or revoked.  Performed at [facility], [place] [address], [place], [place] [address]     Please note: You were cared for by a hospitalist during  your hospital stay. Once you are discharged, your primary care physician will handle any further medical issues. Please note that NO REFILLS for any discharge medications will be authorized once you are discharged, as it is imperative that you return to your primary care physician (or establish a relationship with a primary care physician if you do not have one) for your post hospital discharge needs so that they can reassess your need for medications and monitor your lab values.    Time coordinating discharge: 40 minutes  SIGNED:   Shelly Coss, MD  [facility] 12/14/2021, 11:15 AM Pager [phone]  If 7PM-7AM, please contact night-coverage [email or web address] Password [email or web address]

## 2021-12-14 NOTE — Progress Notes (Signed)
PT Cancellation Note  Patient Details Name: Makinsey Pepitone MRN: [id number] DOB: 1930/01/06   Cancelled Treatment:    Reason Eval/Treat Not Completed: Medical issues which prohibited therapy. Per chart review, plan is possibly for [facility] After secure chat with MD, PT will sign off at this time.    [facility] [place] [facility]  Office: [phone] Pager: [phone]

## 2021-12-14 NOTE — Progress Notes (Signed)
Manufacturing engineer [facility]) Hospital Liaison Note  Received request from [facility] for family interest in [facility]. Met with daughter to confirm interest and explain services.  Chart reviewed and patient is eligible for BP per [facility] MD.  [facility] bed accepted by patient's daughter and family agreeable to transfer this evening. [facility] Registration paperwork completed by patient daughter this afternoon and ready to transfer if medically stable. TOC aware of all of the above.  RN please call report to [phone] and arrange transport for patient to arrive to [facility] as soon as possible.   Thank you.   Gar Ponto, RN [facility] Liaison  [place] are on AMION

## 2021-12-14 NOTE — TOC Progression Note (Addendum)
Transition of Care [facility]) - Progression Note    Patient Details  Name: Delane Stalling MRN: [id number] Date of Birth: 1930/04/12  Transition of Care [facility]) CM/SW Contact  Leeroy Cha, RN Phone Number: 12/14/2021, 11:27 AM  Clinical Narrative:    Tcf-d.Noberto Retort with [employer] will research and make sure that pt is not under another hospice/wcb with decision Patient can be admitted into [facility] in the am on [id number].  Expected Discharge Plan: [place] Barriers to Discharge: Continued Medical Work up  Expected Discharge Plan and Services Expected Discharge Plan: [place]         Expected Discharge Date: 12/14/21                                     Social Determinants of Health (SDOH) Interventions    Readmission Risk Interventions   No data to display

## 2021-12-14 NOTE — Progress Notes (Signed)
Patient discharging to [facility] - alerted by hospice liaison.  DNR and AVS in placed in packet to be transported with patient.  PTAR called for pick up, IV removed - WNL.  Report called to [place] at facility.  Patient in NAD at this time waiting for pick up

## 2022-01-17 DEATH — deceased
